# Patient Record
Sex: Male | Born: 1970 | Race: White | Hispanic: No | Smoking: Former smoker
Health system: Southern US, Community
[De-identification: ages and names within clinical notes are randomized; demographics above are authoritative.]

## PROBLEM LIST (undated history)

## (undated) DIAGNOSIS — Z9109 Other allergy status, other than to drugs and biological substances: Secondary | ICD-10-CM

## (undated) DIAGNOSIS — R519 Headache, unspecified: Secondary | ICD-10-CM

## (undated) DIAGNOSIS — G4730 Sleep apnea, unspecified: Secondary | ICD-10-CM

## (undated) DIAGNOSIS — E119 Type 2 diabetes mellitus without complications: Secondary | ICD-10-CM

## (undated) DIAGNOSIS — J189 Pneumonia, unspecified organism: Secondary | ICD-10-CM

## (undated) DIAGNOSIS — K209 Esophagitis, unspecified without bleeding: Secondary | ICD-10-CM

## (undated) DIAGNOSIS — J449 Chronic obstructive pulmonary disease, unspecified: Secondary | ICD-10-CM

## (undated) DIAGNOSIS — T7840XA Allergy, unspecified, initial encounter: Secondary | ICD-10-CM

## (undated) DIAGNOSIS — L509 Urticaria, unspecified: Secondary | ICD-10-CM

## (undated) DIAGNOSIS — F429 Obsessive-compulsive disorder, unspecified: Secondary | ICD-10-CM

## (undated) DIAGNOSIS — K219 Gastro-esophageal reflux disease without esophagitis: Secondary | ICD-10-CM

## (undated) DIAGNOSIS — J45909 Unspecified asthma, uncomplicated: Secondary | ICD-10-CM

## (undated) DIAGNOSIS — L409 Psoriasis, unspecified: Secondary | ICD-10-CM

## (undated) DIAGNOSIS — Z8669 Personal history of other diseases of the nervous system and sense organs: Secondary | ICD-10-CM

## (undated) DIAGNOSIS — F419 Anxiety disorder, unspecified: Secondary | ICD-10-CM

## (undated) DIAGNOSIS — Z8489 Family history of other specified conditions: Secondary | ICD-10-CM

## (undated) DIAGNOSIS — IMO0002 Reserved for concepts with insufficient information to code with codable children: Secondary | ICD-10-CM

## (undated) DIAGNOSIS — J439 Emphysema, unspecified: Secondary | ICD-10-CM

## (undated) DIAGNOSIS — F32A Depression, unspecified: Secondary | ICD-10-CM

## (undated) DIAGNOSIS — R0902 Hypoxemia: Secondary | ICD-10-CM

## (undated) DIAGNOSIS — R0600 Dyspnea, unspecified: Secondary | ICD-10-CM

## (undated) HISTORY — DX: Hypoxemia: R09.02

## (undated) HISTORY — DX: Depression, unspecified: F32.A

## (undated) HISTORY — DX: Type 2 diabetes mellitus without complications: E11.9

## (undated) HISTORY — PX: KNEE SURGERY: SHX244

## (undated) HISTORY — DX: Urticaria, unspecified: L50.9

## (undated) HISTORY — DX: Emphysema, unspecified: J43.9

## (undated) HISTORY — DX: Allergy, unspecified, initial encounter: T78.40XA

## (undated) HISTORY — DX: Reserved for concepts with insufficient information to code with codable children: IMO0002

## (undated) HISTORY — DX: Psoriasis, unspecified: L40.9

## (undated) NOTE — *Deleted (*Deleted)
Richard Fuentes has only attended 3 exercise sessions in pulmonary rehab since he started 10/16/2019.  He has not called in 2 weeks to inform us that he will not be present for the exercise sessions.  He is being discharged from the program due to extremely poor attendance.

---
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
## 2000-01-05 HISTORY — PX: KNEE SURGERY: SHX244

## 2010-04-28 ENCOUNTER — Inpatient Hospital Stay (INDEPENDENT_AMBULATORY_CARE_PROVIDER_SITE_OTHER)
Admission: RE | Admit: 2010-04-28 | Discharge: 2010-04-28 | Disposition: A | Discharge status: Home / Self Care | Payer: Self-pay | Source: Ambulatory Visit | Attending: Emergency Medicine | Admitting: Emergency Medicine

## 2010-04-28 DIAGNOSIS — R109 Unspecified abdominal pain: Secondary | ICD-10-CM

## 2010-04-28 DIAGNOSIS — R198 Other specified symptoms and signs involving the digestive system and abdomen: Secondary | ICD-10-CM

## 2010-04-28 LAB — POCT I-STAT, CHEM 8
BUN: 14 mg/dL (ref 6–23)
Chloride: 103 mEq/L (ref 96–112)
HCT: 51 % (ref 39.0–52.0)
Sodium: 138 mEq/L (ref 135–145)
TCO2: 27 mmol/L (ref 0–100)

## 2010-04-28 LAB — AST: AST: 26 U/L (ref 0–37)

## 2010-04-28 LAB — TSH: TSH: 0.821 u[IU]/mL (ref 0.350–4.500)

## 2010-04-28 LAB — ALT: ALT: 17 U/L (ref 0–53)

## 2010-12-23 ENCOUNTER — Ambulatory Visit: Payer: Self-pay

## 2010-12-23 DIAGNOSIS — D709 Neutropenia, unspecified: Secondary | ICD-10-CM

## 2010-12-23 DIAGNOSIS — R3911 Hesitancy of micturition: Secondary | ICD-10-CM

## 2011-12-06 ENCOUNTER — Ambulatory Visit: Payer: Self-pay

## 2011-12-06 ENCOUNTER — Ambulatory Visit: Payer: Self-pay | Admitting: Internal Medicine

## 2011-12-06 VITALS — BP 134/82 | HR 139 | Temp 98.3°F | Resp 24

## 2011-12-06 DIAGNOSIS — F411 Generalized anxiety disorder: Secondary | ICD-10-CM

## 2011-12-06 DIAGNOSIS — R0600 Dyspnea, unspecified: Secondary | ICD-10-CM

## 2011-12-06 DIAGNOSIS — R0609 Other forms of dyspnea: Secondary | ICD-10-CM

## 2011-12-06 DIAGNOSIS — J45909 Unspecified asthma, uncomplicated: Secondary | ICD-10-CM

## 2011-12-06 DIAGNOSIS — R0989 Other specified symptoms and signs involving the circulatory and respiratory systems: Secondary | ICD-10-CM

## 2011-12-06 DIAGNOSIS — R05 Cough: Secondary | ICD-10-CM

## 2011-12-06 DIAGNOSIS — F17200 Nicotine dependence, unspecified, uncomplicated: Secondary | ICD-10-CM

## 2011-12-06 DIAGNOSIS — R059 Cough, unspecified: Secondary | ICD-10-CM

## 2011-12-06 LAB — POCT CBC
Granulocyte percent: 67.9 %G (ref 37–80)
Hemoglobin: 16.1 g/dL (ref 14.1–18.1)
MCH, POC: 30.4 pg (ref 27–31.2)
MID (cbc): 1 — AB (ref 0–0.9)
MPV: 9.1 fL (ref 0–99.8)
POC MID %: 6.4 %M (ref 0–12)
Platelet Count, POC: 355 10*3/uL (ref 142–424)
RBC: 5.3 M/uL (ref 4.69–6.13)
WBC: 15 10*3/uL — AB (ref 4.6–10.2)

## 2011-12-06 MED ORDER — HYDROCODONE-HOMATROPINE 5-1.5 MG/5ML PO SYRP: 5.0000 mL | ORAL_SOLUTION | Freq: Four times a day (QID) | ORAL | Status: AC | PRN

## 2011-12-06 MED ORDER — ALBUTEROL SULFATE (2.5 MG/3ML) 0.083% IN NEBU
2.5000 mg | INHALATION_SOLUTION | Freq: Once | RESPIRATORY_TRACT | Status: AC
  Administered 2011-12-06: 2.5 mg via RESPIRATORY_TRACT

## 2011-12-06 MED ORDER — PREDNISONE 20 MG PO TABS: ORAL_TABLET | ORAL | Status: DC

## 2011-12-06 MED ORDER — AZITHROMYCIN 500 MG PO TABS: 500.0000 mg | ORAL_TABLET | Freq: Every day | ORAL | Status: DC

## 2011-12-06 NOTE — Progress Notes (Signed)
  Subjective:    Patient ID: Richard Fuentes, male    DOB: [birth year], 41 y.o.   MRN: [id number]  HPIcalled by staff to see urgently due to SOB Patient was in car 10 min ago and suddenlt felt dry throat and inability to get air in so came here immediately/ no chest pain,diaphoresis/nausea Hx cough for 1-2 months, incr worse over last 4 days tho no fever,chills Hx of problems breathing for few years but never evaluated/uses fathers ventolin prn Smokes and has many years Hx of SOB episodes that are part of Panic attacks which he now has frequently//had panic today as he noted the SOB/hx of past rx w/ paxil for GAD-Panic-OCD in [place] but could no longer afford so d/c ed 2-3 yrs ago--now uses breathing and imagery In [place] caring for father    Review of Systems Denies other sxtoms    Objective:   Physical Exam Apprehensive but PO 100% Able to decrease resp rate and sxtoms just by talking/examining HEENT clear No nodes or thyromeg Heart reg at 88 with no M Lungs w/ wheezing w/ forced expir at the R base Attempted neb w/ albut but he became panicky w/ breather      Results for orders placed in visit on 12/06/11  POCT CBC      Component Value Range   WBC 15.0 (*) 4.6 - 10.2 K/uL   Lymph, poc 3.9 (*) 0.6 - 3.4   POC LYMPH PERCENT 25.7  10 - 50 %L   MID (cbc) 1.0 (*) 0 - 0.9   POC MID % 6.4  0 - 12 %M   POC Granulocyte 10.2 (*) 2 - 6.9   Granulocyte percent 67.9  37 - 80 %G   RBC 5.30  4.69 - 6.13 M/uL   Hemoglobin 16.1  14.1 - 18.1 g/dL   HCT, POC 28.4  13.2 - 53.7 %   MCV 97.7 (*) 80 - 97 fL   MCH, POC 30.4  27 - 31.2 pg   MCHC 31.1 (*) 31.8 - 35.4 g/dL   RDW, POC 44.0     Platelet Count, POC 355  142 - 424 K/uL   MPV 9.1  0 - 99.8 fL   [facility] reading (PRIMARY) by  Dr. Merla Riches-  Increased markings right lower lobe/lots of chronic vs acute changes bilat  Calm w/ no breathing difficulties at conclusion of exam Assessment & Plan:  Acute SOB due to LRI plus  underlying RAD GAD w/ Panic  Meds ordered this encounter  Medications  . azithromycin (ZITHROMAX) 500 MG tablet    Sig: Take 1 tablet (500 mg total) by mouth daily.    Dispense:  5 tablet    Refill:  0  . predniSONE (DELTASONE) 20 MG tablet    Sig: 3/3/2/2/1/1 single daily dose for 6 days    Dispense:  12 tablet    Refill:  0  . HYDROcodone-homatropine (HYCODAN) 5-1.5 MG/5ML syrup    Sig: Take 5 mLs by mouth every 6 (six) hours as needed for cough.    Dispense:  120 mL    Refill:  0  He has Vent/also uses benadryl Offered f/u for CPE and rx of all problems tho he refuses for financial reasons

## 2011-12-07 ENCOUNTER — Encounter: Payer: Self-pay | Admitting: Internal Medicine

## 2011-12-07 DIAGNOSIS — J45909 Unspecified asthma, uncomplicated: Secondary | ICD-10-CM | POA: Insufficient documentation

## 2011-12-07 DIAGNOSIS — F17200 Nicotine dependence, unspecified, uncomplicated: Secondary | ICD-10-CM | POA: Insufficient documentation

## 2011-12-07 DIAGNOSIS — F411 Generalized anxiety disorder: Secondary | ICD-10-CM | POA: Insufficient documentation

## 2012-01-03 ENCOUNTER — Emergency Department (INDEPENDENT_AMBULATORY_CARE_PROVIDER_SITE_OTHER): Payer: Self-pay

## 2012-01-03 ENCOUNTER — Emergency Department (INDEPENDENT_AMBULATORY_CARE_PROVIDER_SITE_OTHER)
Admission: EM | Admit: 2012-01-03 | Discharge: 2012-01-03 | Disposition: A | Discharge status: Home / Self Care | Payer: Self-pay | Source: Home / Self Care | Attending: Emergency Medicine | Admitting: Emergency Medicine

## 2012-01-03 ENCOUNTER — Encounter (HOSPITAL_COMMUNITY): Payer: Self-pay | Admitting: *Deleted

## 2012-01-03 DIAGNOSIS — J439 Emphysema, unspecified: Secondary | ICD-10-CM

## 2012-01-03 DIAGNOSIS — R0609 Other forms of dyspnea: Secondary | ICD-10-CM

## 2012-01-03 DIAGNOSIS — R0989 Other specified symptoms and signs involving the circulatory and respiratory systems: Secondary | ICD-10-CM

## 2012-01-03 DIAGNOSIS — J438 Other emphysema: Secondary | ICD-10-CM

## 2012-01-03 DIAGNOSIS — R0600 Dyspnea, unspecified: Secondary | ICD-10-CM

## 2012-01-03 MED ORDER — PREDNISONE 20 MG PO TABS: 40.0000 mg | ORAL_TABLET | Freq: Every day | ORAL | Status: DC

## 2012-01-03 MED ORDER — OMEPRAZOLE 20 MG PO CPDR: 40.0000 mg | DELAYED_RELEASE_CAPSULE | Freq: Every day | ORAL | Status: DC

## 2012-01-03 MED ORDER — IPRATROPIUM BROMIDE HFA 17 MCG/ACT IN AERS: 2.0000 | INHALATION_SPRAY | Freq: Four times a day (QID) | RESPIRATORY_TRACT | Status: DC

## 2012-01-03 NOTE — ED Notes (Signed)
Pt  Reports  Symptoms  Of  Cough  /  Congested    Difficulty  Breathing     Pt     Reports  Was  Seen       Earlier  This  Month  At         [facility]    For  resp  Infection   But  Did  Not  Get  Anti biotics  Filled   -  Pt is  A  Smoker

## 2012-01-03 NOTE — ED Provider Notes (Addendum)
History     CSN: [id number]  Arrival date & time 01/03/12  4098   First MD Initiated Contact with Patient 01/03/12 1813      Chief Complaint  Patient presents with  . Cough    (Consider location/radiation/quality/duration/timing/severity/associated sxs/prior treatment) HPI Comments: Patient presents this evening to urgent care describing that he's been coughing and congestion and having difficulty breathing for the last few weeks. He describes that he was seen at [facility] urgent care about 3 weeks ago was prescribed antibiotics that he couldn't fill out for financial reasons. He continues to cough and feels tired and short of breath.  Patient also describes that he came to urgent care about 2 years ago ( shows [place] discharged instructions- where he was provided with referrals to establish her primary care doctor's and diagnosed with GERD and prescribed a PPI at that time).  Patient describes it he continues to have symptoms after all these years of reflux.  Patient is a 41 y.o. male presenting with cough. The history is provided by the patient.  Cough This is a recurrent problem. The problem occurs constantly. The problem has not changed since onset.The cough is productive of sputum. There has been no fever. Associated symptoms include shortness of breath. Pertinent negatives include no chest pain, no chills, no sweats, no weight loss, no ear congestion, no ear pain, no headaches, no rhinorrhea, no myalgias and no wheezing. He has tried nothing for the symptoms. The treatment provided no relief. His past medical history does not include bronchitis, pneumonia, bronchiectasis, COPD, emphysema or asthma.    History reviewed. No pertinent past medical history.  History reviewed. No pertinent past surgical history.  History reviewed. No pertinent family history.  History  Substance Use Topics  . Smoking status: Current Every Day Smoker  . Smokeless tobacco: Not on file  . Alcohol Use: Not  on file      Review of Systems  Constitutional: Positive for fatigue. Negative for fever, chills, weight loss, diaphoresis, activity change and unexpected weight change.  HENT: Negative for ear pain and rhinorrhea.   Respiratory: Positive for cough, chest tightness and shortness of breath. Negative for apnea, choking, wheezing and stridor.   Cardiovascular: Negative for chest pain.  Gastrointestinal: Negative for nausea.  Genitourinary: Negative for dysuria.  Musculoskeletal: Negative for myalgias.  Neurological: Negative for headaches.    Allergies  Penicillins  Home Medications   Current Outpatient Rx  Name  Route  Sig  Dispense  Refill  . AZITHROMYCIN 500 MG PO TABS   Oral   Take 1 tablet (500 mg total) by mouth daily.   5 tablet   0   . DIPHENHYDRAMINE HCL (SLEEP) 25 MG PO TABS   Oral   Take 25 mg by mouth daily.         . IPRATROPIUM BROMIDE HFA 17 MCG/ACT IN AERS   Inhalation   Inhale 2 puffs into the lungs every 6 (six) hours.   1 Inhaler   1   . OMEPRAZOLE 20 MG PO CPDR   Oral   Take 2 capsules (40 mg total) by mouth daily.   30 capsule   2   . PREDNISONE 20 MG PO TABS      3/3/2/2/1/1 single daily dose for 6 days   12 tablet   0   . PREDNISONE 20 MG PO TABS   Oral   Take 2 tablets (40 mg total) by mouth daily. 2 tablets daily for 5 days  10 tablet   0     BP 116/70  Pulse 78  Temp 97.9 F (36.6 C) (Oral)  Resp 22  SpO2 100%  Physical Exam  Nursing note and vitals reviewed. Constitutional: Vital signs are normal.  Non-toxic appearance. He does not have a sickly appearance. He appears ill. No distress.  HENT:  Head: Normocephalic.  Eyes: Conjunctivae normal are normal.  Neck: Neck supple. No JVD present.  Cardiovascular: Normal rate and regular rhythm.  Exam reveals no gallop and no friction rub.   No murmur heard. Pulmonary/Chest: Effort normal. No respiratory distress. He has decreased breath sounds. He has no wheezes. He has no  rhonchi. He has no rales. He exhibits no tenderness.  Lymphadenopathy:    He has no cervical adenopathy.  Skin: Skin is warm. No rash noted. He is not diaphoretic. No erythema.    ED Course  Procedures (including critical care time)  Labs Reviewed - No data to display Dg Chest 2 View  01/03/2012  *RADIOLOGY REPORT*  Clinical Data: 41 year old male cough, congestion, shortness of breath.  CHEST - 2 VIEW  Comparison: 12/06/2011.  Findings: Stable hyperinflated lungs.  Cardiac size and mediastinal contours are within normal limits.  Visualized tracheal air column is within normal limits.  Attenuation of upper lobe vascular markings compatible with emphysema.  No pneumothorax, pulmonary edema, pleural effusion or confluent pulmonary opacity. No acute osseous abnormality identified.  IMPRESSION: Hyperinflation/emphysema. No acute cardiopulmonary abnormality.   Original Report Authenticated By: Erskine Speed, M.D.      1. Emphysema   2. Dyspnea   3. Esophagitis     EKG former urgent care showing normal sinus rhythm ventricular rate of 73 beats per minute. No ST or T changes to suggest acute or chronic ischemia no abnormal TR QRS duration intervals. Normal QT interval.  MDM   Problem #1  Dyspnea in a chronic cough. Patient is a chronic smoker,   smoking cessation discussed. Patient was prescribed Atrovent and a course of prednisone. Encouraged him to followup with primary care Dr. and suggested to request a pulmonary functional test. Patient seemed to be pre-contemplative about discontinue smoking.  Problem #2 recurrent reflux/GERD symptomatology. Patient was prescribed a PPI and given the length of his symptoms have recommended he see a gastroenterologist to rule out Barrett's esophagus or esophageal malignancy. Patient does not have any stigmata or constitutional symptoms to suggest a malignancy but the chronicity of her symptoms are somewhat worrisome.   Problem #3 have recommended patient  to establish continuity of care at [facility] urgent care as he has Been there before.     Jimmie Molly, MD 01/03/12 1610  Jimmie Molly, MD 01/03/12 [phone]

## 2012-01-06 ENCOUNTER — Ambulatory Visit (INDEPENDENT_AMBULATORY_CARE_PROVIDER_SITE_OTHER): Payer: Self-pay | Admitting: Internal Medicine

## 2012-01-06 VITALS — BP 117/75 | HR 76 | Temp 98.2°F | Resp 16 | Ht 73.0 in | Wt 179.6 lb

## 2012-01-06 DIAGNOSIS — R05 Cough: Secondary | ICD-10-CM

## 2012-01-06 DIAGNOSIS — R059 Cough, unspecified: Secondary | ICD-10-CM

## 2012-01-06 DIAGNOSIS — J209 Acute bronchitis, unspecified: Secondary | ICD-10-CM

## 2012-01-06 DIAGNOSIS — J440 Chronic obstructive pulmonary disease with acute lower respiratory infection: Secondary | ICD-10-CM

## 2012-01-06 NOTE — Progress Notes (Signed)
  Subjective:    Patient ID: Richard Fuentes, male    DOB: 1970-06-26, 42 y.o.   MRN: [id number]  HPI  Shortness of breath, cough, can't sleep secondary to shortness of breath. No sputum. No chest pain. Onset 4 months ago. first dr visit for this about 1 month ago here. He was dx with acute bronchitus and given steroids and antibitoic a z pack.Has an albuterol inhaler from his dad which is unused. pt had the medication filled but he did not take the medication because he read about it on the internet and was afraid he woudl have a reaction to it. On dec 30 pt went to [place] [facility] and had a second chest xray which was read by radiology oficailly as early copd. Pt was instructed to take the medication but again did not take the meds. tonight he was going to the waiting room at the er to sit there nad not check in to take the meds and be in the right place in case he had a reaction to the meds but he decided to come here for a second opinion. Continue to be sob at rest. no chest pain. no fever.pt is a smoker and has smoked 1 1/2 packs year for 40 years.   Review of Systems  Constitutional: Negative.   HENT: Negative.   Respiratory: Positive for shortness of breath. Negative for cough, choking, chest tightness and stridor.   Cardiovascular: Negative.   Gastrointestinal: Negative.   Genitourinary: Negative.   Musculoskeletal: Negative.   Skin: Negative.   Neurological: Negative.   Hematological: Negative.   Psychiatric/Behavioral: Negative.        Objective:   Physical Exam  Nursing note and vitals reviewed. Constitutional: He is oriented to person, place, and time. He appears well-developed and well-nourished.  HENT:  Head: Normocephalic and atraumatic.  Right Ear: External ear normal.  Left Ear: External ear normal.  Nose: Nose normal.  Mouth/Throat: Oropharynx is clear and moist.  Eyes: Conjunctivae normal and EOM are normal. Pupils are equal, round, and reactive to light.  Neck:  Normal range of motion. Neck supple.  Cardiovascular: Normal rate, regular rhythm, normal heart sounds and intact distal pulses.   Pulmonary/Chest: He has wheezes.  Abdominal: Soft. Bowel sounds are normal.  Musculoskeletal: Normal range of motion.  Neurological: He is alert and oriented to person, place, and time. He has normal reflexes.  Skin: Skin is warm and dry.  Psychiatric: He has a normal mood and affect. His behavior is normal. Judgment and thought content normal.          Assessment & Plan:  Copd/ bronchitis. Tried to give pt a nebulizer treatment in the office but pt refuses. o2 sat is 100 percent. No need to repeat the chest xray for a third time. Pt instructed to take the medications including the bronchodilator the steroid and the antibiotic. Pt instructed to return for pft adn long term treatment. Pt instructed to stop all smoking and to remove himself from all smoke environments.

## 2012-01-06 NOTE — Patient Instructions (Signed)
Stop smoking. Take the antibiotic , the steroid and use the inhaler as directed. Return for follow up visit in 3 days. If your symptom worsen return to the er.

## 2012-08-13 ENCOUNTER — Encounter (HOSPITAL_COMMUNITY): Payer: Self-pay | Admitting: *Deleted

## 2012-08-13 ENCOUNTER — Emergency Department (INDEPENDENT_AMBULATORY_CARE_PROVIDER_SITE_OTHER)
Admission: EM | Admit: 2012-08-13 | Discharge: 2012-08-13 | Disposition: A | Discharge status: Home / Self Care | Payer: Self-pay | Source: Home / Self Care | Attending: Family Medicine | Admitting: Family Medicine

## 2012-08-13 DIAGNOSIS — J40 Bronchitis, not specified as acute or chronic: Secondary | ICD-10-CM

## 2012-08-13 DIAGNOSIS — F410 Panic disorder [episodic paroxysmal anxiety] without agoraphobia: Secondary | ICD-10-CM

## 2012-08-13 HISTORY — DX: Other allergy status, other than to drugs and biological substances: Z91.09

## 2012-08-13 HISTORY — DX: Chronic obstructive pulmonary disease, unspecified: J44.9

## 2012-08-13 HISTORY — DX: Obsessive-compulsive disorder, unspecified: F42.9

## 2012-08-13 HISTORY — DX: Esophagitis, unspecified without bleeding: K20.90

## 2012-08-13 HISTORY — DX: Personal history of other diseases of the nervous system and sense organs: Z86.69

## 2012-08-13 HISTORY — DX: Anxiety disorder, unspecified: F41.9

## 2012-08-13 HISTORY — DX: Esophagitis, unspecified: K20.9

## 2012-08-13 HISTORY — DX: Unspecified asthma, uncomplicated: J45.909

## 2012-08-13 MED ORDER — METHYLPREDNISOLONE SODIUM SUCC 125 MG IJ SOLR
INTRAMUSCULAR | Status: AC
  Filled 2012-08-13: qty 2

## 2012-08-13 MED ORDER — ALBUTEROL SULFATE (5 MG/ML) 0.5% IN NEBU
INHALATION_SOLUTION | RESPIRATORY_TRACT | Status: AC
  Filled 2012-08-13: qty 0.5

## 2012-08-13 MED ORDER — IPRATROPIUM BROMIDE 0.02 % IN SOLN
0.5000 mg | Freq: Once | RESPIRATORY_TRACT | Status: AC
  Administered 2012-08-13: 0.5 mg via RESPIRATORY_TRACT

## 2012-08-13 MED ORDER — ALBUTEROL SULFATE (5 MG/ML) 0.5% IN NEBU
2.5000 mg | INHALATION_SOLUTION | Freq: Once | RESPIRATORY_TRACT | Status: AC
  Administered 2012-08-13: 2.5 mg via RESPIRATORY_TRACT

## 2012-08-13 MED ORDER — ALBUTEROL SULFATE HFA 108 (90 BASE) MCG/ACT IN AERS: 1.0000 | INHALATION_SPRAY | Freq: Four times a day (QID) | RESPIRATORY_TRACT | Status: DC | PRN

## 2012-08-13 MED ORDER — AZITHROMYCIN 250 MG PO TABS: 250.0000 mg | ORAL_TABLET | Freq: Every day | ORAL | Status: DC

## 2012-08-13 MED ORDER — METHYLPREDNISOLONE SODIUM SUCC 125 MG IJ SOLR: 80.0000 mg | Freq: Once | INTRAMUSCULAR | Status: DC

## 2012-08-13 MED ORDER — EPINEPHRINE 0.3 MG/0.3ML IJ SOAJ: 0.3000 mg | INTRAMUSCULAR | Status: DC | PRN

## 2012-08-13 MED ORDER — CLONAZEPAM 0.5 MG PO TABS: 0.5000 mg | ORAL_TABLET | Freq: Two times a day (BID) | ORAL | Status: DC | PRN

## 2012-08-13 MED ORDER — PREDNISONE 20 MG PO TABS: ORAL_TABLET | ORAL | Status: DC

## 2012-08-13 MED ORDER — DIPHENHYDRAMINE HCL 25 MG PO CAPS
50.0000 mg | ORAL_CAPSULE | Freq: Once | ORAL | Status: AC
  Administered 2012-08-13: 50 mg via ORAL

## 2012-08-13 MED ORDER — DIPHENHYDRAMINE HCL 25 MG PO CAPS
ORAL_CAPSULE | ORAL | Status: AC
  Filled 2012-08-13: qty 2

## 2012-08-13 NOTE — ED Notes (Signed)
Pt requested to complete breathing treatment as "Benadryl seems to be working".

## 2012-08-13 NOTE — ED Provider Notes (Signed)
CSN: [id number]     Arrival date & time 08/13/12  1240 History     First MD Initiated Contact with Patient 08/13/12 1315     Chief Complaint  Patient presents with  . Shortness of Breath  . Wheezing  . Chest Pain   (Consider location/radiation/quality/duration/timing/severity/associated sxs/prior Treatment) HPI Comments: 42 year old male smoker with history of COPD and anxiety disorder among other comorbidities. Here complaining of intermittent wheezing and productive cough of brownish yellow sputum for one week. Symptoms worse on last night and state he has not been able to sleep well. Denies fever or chills. Reports chest tightness and anxiety but denies chest pain or pleuritic chest type of discomfort. appetite is at base line no abdominal pain. States he distrusts medications and that "has not taken any medication since 1994" although later reported he took a benadryl 25 mg tab about 6 hours ago.  States he has never filled the prescriptions he has gotten in the past.    Past Medical History  Diagnosis Date  . COPD (chronic obstructive pulmonary disease)   . Esophagitis   . Asthma   . Environmental allergies   . Hx of migraines   . OCD (obsessive compulsive disorder)   . Anxiety    Past Surgical History  Procedure Laterality Date  . Knee surgery     No family history on file. History  Substance Use Topics  . Smoking status: Current Every Day Smoker -- 1.00 packs/day    Types: Cigarettes  . Smokeless tobacco: Not on file  . Alcohol Use: No    Review of Systems  Constitutional: Negative for fever, chills, diaphoresis, activity change, appetite change and fatigue.  HENT: Negative for congestion, sore throat, rhinorrhea, sneezing and sinus pressure.   Eyes: Negative for discharge.  Respiratory: Positive for cough, shortness of breath and wheezing.   Cardiovascular: Positive for chest pain.  Gastrointestinal: Negative for nausea, vomiting, abdominal pain, diarrhea and  blood in stool.  Endocrine: Negative for cold intolerance, heat intolerance, polydipsia, polyphagia and polyuria.  Skin: Negative for rash.  Neurological: Negative for dizziness, tremors, seizures, syncope, weakness and headaches.  Psychiatric/Behavioral: Positive for behavioral problems. Negative for suicidal ideas, hallucinations, confusion, self-injury and agitation. The patient is nervous/anxious.   All other systems reviewed and are negative.    Allergies  Penicillins  Home Medications   Current Outpatient Rx  Name  Route  Sig  Dispense  Refill  . albuterol (PROVENTIL HFA;VENTOLIN HFA) 108 (90 BASE) MCG/ACT inhaler   Inhalation   Inhale 1-2 puffs into the lungs every 6 (six) hours as needed for wheezing.   1 Inhaler   0   . azithromycin (ZITHROMAX) 250 MG tablet   Oral   Take 1 tablet (250 mg total) by mouth daily. Take first 2 tablets together, then 1 every day until finished.   6 tablet   0   . clonazePAM (KLONOPIN) 0.5 MG tablet   Oral   Take 1 tablet (0.5 mg total) by mouth 2 (two) times daily as needed for anxiety.   10 tablet   0   . diphenhydrAMINE (SOMINEX) 25 MG tablet   Oral   Take 25 mg by mouth daily.         Marland Kitchen EPINEPHrine (EPIPEN) 0.3 mg/0.3 mL SOAJ injection   Intramuscular   Inject 0.3 mLs (0.3 mg total) into the muscle as needed.   1 Device   2   . ipratropium (ATROVENT HFA) 17 MCG/ACT inhaler   Inhalation  Inhale 2 puffs into the lungs every 6 (six) hours.   1 Inhaler   1   . EXPIRED: omeprazole (PRILOSEC) 20 MG capsule   Oral   Take 2 capsules (40 mg total) by mouth daily.   30 capsule   2   . predniSONE (DELTASONE) 20 MG tablet      2 tabs po daily for 5 days   10 tablet   0   . ranitidine (ZANTAC) 75 MG tablet   Oral   Take 75 mg by mouth 2 (two) times daily.          BP 160/85  Pulse 87  Temp(Src) 98.3 F (36.8 C) (Oral)  Resp 20  SpO2 99% Physical Exam  Nursing note and vitals reviewed. Constitutional: He is  oriented to person, place, and time. He appears well-developed and well-nourished. He appears distressed.  HENT:  Head: Normocephalic and atraumatic.  Right Ear: External ear normal.  Left Ear: External ear normal.  Nose: Nose normal.  Mouth/Throat: Oropharynx is clear and moist. No oropharyngeal exudate.  Eyes: Conjunctivae and EOM are normal. Pupils are equal, round, and reactive to light. Right eye exhibits no discharge. Left eye exhibits no discharge. No scleral icterus.  Neck: Neck supple. No JVD present. No thyromegaly present.  Cardiovascular: Normal rate, regular rhythm, normal heart sounds and intact distal pulses.  Exam reveals no gallop and no friction rub.   No murmur heard. Pulmonary/Chest: Effort normal. No respiratory distress. He has no rales. He exhibits no tenderness.  Bilateral mild inspiratory and sporadic expiratory wheezing, no crackles. No tachypnea no orthopnea, talking in full sentences's.   Abdominal: Soft. He exhibits no mass. There is no tenderness.  Lymphadenopathy:    He has no cervical adenopathy.  Neurological: He is alert and oriented to person, place, and time.  Skin: No rash noted. He is not diaphoretic.  Psychiatric: Judgment and thought content normal.  Patient anxious, appears possible OCD. Compulsion against medications and treatment refused treatment saying "Im sorry my plan was to receive treatment but I just can" I just need a new brain..    ED Course   Procedures (including critical care time)  Labs Reviewed - No data to display No results found. 1. Anxiety attack   2. Bronchitis     MDM  Impress no acute respiratory distress likely chronic COPD symptoms with mild exacerbation but my impression is that most symptoms likely triggered by anxiety disorder. Patient initially agreed to receive treatment (nebulization and IM solumedrol after a benadryl tablet to help with anxiety but later refused. Appears capable to make decisions.  Prescribed  klonopin 0.5mg  x10 tab. Albuterol inhaler, azithromycin and prednisone.  Behavioral clinic referral. Asked to go to the ED if worsening or new symptoms.   Sharin Grave, MD 08/14/12 [phone]

## 2012-08-13 NOTE — ED Notes (Signed)
Pt declines injection at this time.  States he would feel more comfortable waiting until Benedryl has some time to work before receiving breathing treatment.  Comfort measures offered.

## 2012-08-13 NOTE — ED Notes (Signed)
Explained to pt need for securing a ride home prior to receiving any meds due to Benadryl causing drowsiness.  Pt states he is attempting to secure.

## 2012-08-13 NOTE — ED Notes (Signed)
Patient hesitantly taking breathing treatment.  Still states he wants to wait on the injection.  Family member en route.  Exam door open for pt's anxiety/comfort.

## 2012-08-13 NOTE — ED Notes (Signed)
Pt calling to obtain ride home (drove himself).

## 2012-08-13 NOTE — ED Notes (Signed)
Pt stopped breathing treatment approx 5 minutes after initiating.  States he cannot do it, it's making him feel very anxious.  Inquired "how can I stop this?"  Discussed need for behavioral health therapy.  States he used to go, but "then it changed", and he is unsure if he could go back again due to anxiety.  States was never able to try medication for OCD and anxiety.

## 2012-08-13 NOTE — ED Notes (Signed)
Reports productive cough, wheezing sensation, chest tightness, dyspnea x 1 wk without fevers.  Does not have any inhalers at home.  Chest tightness is generalized and worse with coughing.  Has been sleeping in recliner due to increased dyspnea.  States all sxs have gotten worse since last night.

## 2012-08-13 NOTE — ED Notes (Signed)
Reviewed available Rxs with pt & family member.  Instructed to go to [facility] ED (due to behavioral health component) for any worsening sxs, SOB, getting sicker.  Instructed not to drive for at least 8 hrs due to Benadryl.

## 2012-08-13 NOTE — ED Notes (Signed)
Pt apologetic, anxious, and hesitant to take any meds.  States he never fills Rxs or takes medicines due to anxiety.  "I never really leave the house either.  It was really hard for me to come here."  States he does have a ride home.

## 2012-08-13 NOTE — ED Notes (Signed)
Breathing treatment complete.  Family member arrival.

## 2015-05-25 ENCOUNTER — Emergency Department (HOSPITAL_COMMUNITY): Payer: Self-pay

## 2015-05-25 ENCOUNTER — Encounter (HOSPITAL_COMMUNITY): Payer: Self-pay

## 2015-05-25 ENCOUNTER — Emergency Department (HOSPITAL_COMMUNITY)
Admission: EM | Admit: 2015-05-25 | Discharge: 2015-05-26 | Disposition: A | Discharge status: Home / Self Care | Payer: Self-pay | Attending: Emergency Medicine | Admitting: Emergency Medicine

## 2015-05-25 DIAGNOSIS — R062 Wheezing: Secondary | ICD-10-CM

## 2015-05-25 DIAGNOSIS — R0789 Other chest pain: Secondary | ICD-10-CM

## 2015-05-25 DIAGNOSIS — Z79899 Other long term (current) drug therapy: Secondary | ICD-10-CM | POA: Insufficient documentation

## 2015-05-25 DIAGNOSIS — J441 Chronic obstructive pulmonary disease with (acute) exacerbation: Secondary | ICD-10-CM | POA: Insufficient documentation

## 2015-05-25 DIAGNOSIS — R059 Cough, unspecified: Secondary | ICD-10-CM

## 2015-05-25 DIAGNOSIS — Z720 Tobacco use: Secondary | ICD-10-CM

## 2015-05-25 DIAGNOSIS — R0602 Shortness of breath: Secondary | ICD-10-CM

## 2015-05-25 DIAGNOSIS — F17210 Nicotine dependence, cigarettes, uncomplicated: Secondary | ICD-10-CM | POA: Insufficient documentation

## 2015-05-25 DIAGNOSIS — R05 Cough: Secondary | ICD-10-CM

## 2015-05-25 MED ORDER — ALBUTEROL SULFATE HFA 108 (90 BASE) MCG/ACT IN AERS: 2.0000 | INHALATION_SPRAY | RESPIRATORY_TRACT | Status: DC | PRN

## 2015-05-25 MED ORDER — ALBUTEROL SULFATE (2.5 MG/3ML) 0.083% IN NEBU
5.0000 mg | INHALATION_SOLUTION | Freq: Once | RESPIRATORY_TRACT | Status: AC
  Administered 2015-05-25: 5 mg via RESPIRATORY_TRACT
  Filled 2015-05-25: qty 6

## 2015-05-25 MED ORDER — ALBUTEROL SULFATE (2.5 MG/3ML) 0.083% IN NEBU
5.0000 mg | INHALATION_SOLUTION | Freq: Once | RESPIRATORY_TRACT | Status: AC
Start: 2015-05-25 — End: 2015-05-25
  Administered 2015-05-25: 5 mg via RESPIRATORY_TRACT
  Filled 2015-05-25: qty 6

## 2015-05-25 MED ORDER — AZITHROMYCIN 250 MG PO TABS: ORAL_TABLET | ORAL | Status: DC

## 2015-05-25 MED ORDER — PREDNISONE 20 MG PO TABS
60.0000 mg | ORAL_TABLET | Freq: Once | ORAL | Status: AC
  Administered 2015-05-26: 60 mg via ORAL
  Filled 2015-05-25: qty 3

## 2015-05-25 MED ORDER — IPRATROPIUM BROMIDE 0.02 % IN SOLN
0.5000 mg | Freq: Once | RESPIRATORY_TRACT | Status: AC
Start: 2015-05-25 — End: 2015-05-25
  Administered 2015-05-25: 0.5 mg via RESPIRATORY_TRACT
  Filled 2015-05-25: qty 2.5

## 2015-05-25 MED ORDER — PREDNISONE 20 MG PO TABS: ORAL_TABLET | ORAL | Status: DC

## 2015-05-25 NOTE — ED Notes (Signed)
Pt with cough x 3 days.  Asthma.  Inhaler not working. Cough is productive.  No fever.

## 2015-05-25 NOTE — ED Notes (Signed)
Called for patient to be triaged with no answer.

## 2015-05-25 NOTE — ED Provider Notes (Signed)
CSN: [id number]     Arrival date & time 05/25/15  1716 History   First MD Initiated Contact with Patient 05/25/15 2300     Chief Complaint  Patient presents with  . Asthma     (Consider location/radiation/quality/duration/timing/severity/associated sxs/prior Treatment) HPI Comments: Richard Fuentes is a 45 y.o. male with a PMHx of COPD, esophagitis, asthma, environmental allergies, migraines, OCD, and anxiety, who presents to the ED with complaints of cough, wheezing, shortness of breath, and chest tightness 3-4 days. He reports his cough is accompanied by a white sputum production. He states that his home albuterol inhaler has not helped, has no known aggravating factors, and reports relief with the albuterol nebulizer treatment was given in triage. Feels like this is somewhat similar to his asthma exacerbations, although he hasn't had issues with asthma/COPD in 3-4 years. He admits that he continues to be a tobacco smoker.  He denies any fevers, chills, rhinorrhea, sore throat, eye itching or redness, chest pain, hemoptysis, leg swelling, recent travel/surgery/immobilization, personal or family history of DVT/PE, abdominal pain, nausea vomiting, diarrhea, constipation, dysuria, hematuria, numbness, tingling, or focal weakness. Denies any sick contacts.  Patient is a 45 y.o. male presenting with asthma. The history is provided by the patient. No language interpreter was used.  Asthma This is a recurrent problem. The current episode started in the past 7 days. The problem occurs constantly. The problem has been unchanged. Associated symptoms include coughing. Pertinent negatives include no abdominal pain, arthralgias, chest pain, chills, fever, myalgias, nausea, numbness, sore throat, urinary symptoms, vomiting or weakness. Nothing aggravates the symptoms. Treatments tried: home albuterol inhaler and albuterol neb tx here. The treatment provided mild relief.    Past Medical History  Diagnosis Date   . COPD (chronic obstructive pulmonary disease) (HCC)   . Esophagitis   . Asthma   . Environmental allergies   . Hx of migraines   . OCD (obsessive compulsive disorder)   . Anxiety    Past Surgical History  Procedure Laterality Date  . Knee surgery     History reviewed. No pertinent family history. Social History  Substance Use Topics  . Smoking status: Current Every Day Smoker -- 1.00 packs/day    Types: Cigarettes  . Smokeless tobacco: None  . Alcohol Use: No    Review of Systems  Constitutional: Negative for fever and chills.  HENT: Negative for ear pain, rhinorrhea, sinus pressure and sore throat.   Eyes: Negative for redness and itching.  Respiratory: Positive for cough, chest tightness, shortness of breath and wheezing.   Cardiovascular: Negative for chest pain and leg swelling.  Gastrointestinal: Negative for nausea, vomiting, abdominal pain, diarrhea and constipation.  Genitourinary: Negative for dysuria and hematuria.  Musculoskeletal: Negative for myalgias and arthralgias.  Skin: Negative for color change.  Allergic/Immunologic: Positive for environmental allergies. Negative for immunocompromised state.  Neurological: Negative for weakness and numbness.  Psychiatric/Behavioral: Negative for confusion.   10 Systems reviewed and are negative for acute change except as noted in the HPI.    Allergies  Penicillins  Home Medications   Prior to Admission medications   Medication Sig Start Date End Date Taking? Authorizing Provider  albuterol (PROVENTIL HFA;VENTOLIN HFA) 108 (90 BASE) MCG/ACT inhaler Inhale 1-2 puffs into the lungs every 6 (six) hours as needed for wheezing. 08/13/12  Yes Adlih Moreno-Coll, MD  clonazePAM (KLONOPIN) 0.5 MG tablet Take 1 tablet (0.5 mg total) by mouth 2 (two) times daily as needed for anxiety. Patient not taking: Reported on 05/25/2015  08/13/12   Adlih Moreno-Coll, MD  EPINEPHrine (EPIPEN) 0.3 mg/0.3 mL SOAJ injection Inject 0.3 mLs  (0.3 mg total) into the muscle as needed. Patient not taking: Reported on 05/25/2015 08/13/12   Jimmie Molly, MD  ipratropium (ATROVENT HFA) 17 MCG/ACT inhaler Inhale 2 puffs into the lungs every 6 (six) hours. Patient not taking: Reported on 05/25/2015 01/03/12   Jimmie Molly, MD   BP 124/88 mmHg  Pulse 87  Temp(Src) 98.4 F (36.9 C) (Oral)  Resp 20  SpO2 100% Physical Exam  Constitutional: He is oriented to person, place, and time. Vital signs are normal. He appears well-developed and well-nourished.  Non-toxic appearance. No distress.  Afebrile, nontoxic, NAD  HENT:  Head: Normocephalic and atraumatic.  Mouth/Throat: Oropharynx is clear and moist and mucous membranes are normal.  Eyes: Conjunctivae and EOM are normal. Right eye exhibits no discharge. Left eye exhibits no discharge.  Neck: Normal range of motion. Neck supple.  Cardiovascular: Normal rate, regular rhythm, normal heart sounds and intact distal pulses.  Exam reveals no gallop and no friction rub.   No murmur heard. RRR, nl s1/s2, no m/r/g, distal pulses intact, no pedal edema   Pulmonary/Chest: Effort normal. No respiratory distress. He has decreased breath sounds. He has wheezes. He has no rhonchi. He has no rales.  Diminished lung sounds throughout all fields with faint expiratory wheezing noted throughout, no rhonchi or rales, no hypoxia or increased WOB, speaking in full sentences, SpO2 100% on RA   Abdominal: Soft. Normal appearance and bowel sounds are normal. He exhibits no distension. There is no tenderness. There is no rigidity, no rebound, no guarding, no CVA tenderness, no tenderness at McBurney's point and negative Murphy's sign.  Musculoskeletal: Normal range of motion.  Neurological: He is alert and oriented to person, place, and time. He has normal strength. No sensory deficit.  Skin: Skin is warm, dry and intact. No rash noted.  Psychiatric: He has a normal mood and affect.  Nursing note and vitals  reviewed.   ED Course  Procedures (including critical care time) Labs Review Labs Reviewed - No data to display  Imaging Review Dg Chest 2 View  05/25/2015  CLINICAL DATA:  45 year old male with 3 day history of cough. Clinical history of asthma. EXAM: CHEST  2 VIEW COMPARISON:  Prior chest x-ray 01/03/2012 FINDINGS: Stable cardiac and mediastinal contours. Similar chronic bronchitic change and minimal interstitial prominence. No focal airspace consolidation, pulmonary edema, pleural effusion or pneumothorax. The lungs are hyperinflated. IMPRESSION: Stable chest x-ray without evidence of active cardiopulmonary disease. Electronically Signed   By: Malachy Moan M.D.   On: 05/25/2015 18:33   I have personally reviewed and evaluated these images and lab results as part of my medical decision-making.   EKG Interpretation None      MDM   Final diagnoses:  COPD exacerbation (HCC)  Wheezing  Chest tightness  SOB (shortness of breath)  Cough  Tobacco use    45 y.o. male here with cough x3 days with white sputum, wheezing, SOB, and chest tightness. Inhaler at home not helping, but feels like the albuterol neb here helped. Lung sounds diminished throughout with faint expiratory wheezing noted, no rhonchi/rales. No LE swelling, no tachycardia or hypoxia, PERC neg, doubt PE. No chest pain complaint. Doubt need for labs, CXR without acute process. Will give second neb, prednisone, and reassess. Likely home with inhaler/pred/zpack for COPD exacerbation  12:35 AM Pt feeling improved. Lung sounds improved. Will send down inhaler here so  he can have it to go home with. Rx for inhaler, prednisone, and zpak given for COPD exacerbation. Smoking cessation advised. F/up with [facility] in 1wk to establish care and recheck symptoms. I explained the diagnosis and have given explicit precautions to return to the ER including for any other new or worsening symptoms. The patient understands and accepts the  medical plan as it's been dictated and I have answered their questions. Discharge instructions concerning home care and prescriptions have been given. The patient is STABLE and is discharged to home in good condition.  BP 117/71 mmHg  Pulse 75  Temp(Src) 98 F (36.7 C) (Oral)  Resp 20  SpO2 96%  Meds ordered this encounter  Medications  . albuterol (PROVENTIL) (2.5 MG/3ML) 0.083% nebulizer solution 5 mg    Sig:   . albuterol (PROVENTIL) (2.5 MG/3ML) 0.083% nebulizer solution 5 mg    Sig:   . ipratropium (ATROVENT) nebulizer solution 0.5 mg    Sig:   . predniSONE (DELTASONE) tablet 60 mg    Sig:   . predniSONE (DELTASONE) 20 MG tablet    Sig: 3 tabs po daily x 3 days starting on 05/26/15    Dispense:  9 tablet    Refill:  0    Order Specific Question:  Supervising Provider    Answer:  Eber Hong [[id number]]  . azithromycin (ZITHROMAX Z-PAK) 250 MG tablet    Sig: 2 po day one, then 1 daily x 4 days    Dispense:  6 tablet    Refill:  0    Order Specific Question:  Supervising Provider    Answer:  MILLER, BRIAN [[id number]]  . albuterol (PROVENTIL HFA;VENTOLIN HFA) 108 (90 Base) MCG/ACT inhaler    Sig: Inhale 2 puffs into the lungs every 4 (four) hours as needed for wheezing or shortness of breath (cough).    Dispense:  1 Inhaler    Refill:  0    Order Specific Question:  Supervising Provider    Answer:  MILLER, BRIAN [[id number]]  . albuterol (PROVENTIL HFA;VENTOLIN HFA) 108 (90 Base) MCG/ACT inhaler 2 puff    Sig:        Richard Vanscyoc Camprubi-Soms, PA-C 05/26/15 0036  Jacalyn Lefevre, MD 05/27/15 [phone]

## 2015-05-26 MED ORDER — ALBUTEROL SULFATE HFA 108 (90 BASE) MCG/ACT IN AERS
2.0000 | INHALATION_SPRAY | Freq: Once | RESPIRATORY_TRACT | Status: AC
  Administered 2015-05-26: 2 via RESPIRATORY_TRACT
  Filled 2015-05-26: qty 6.7

## 2015-05-26 NOTE — Progress Notes (Signed)
Spacer teaching completed. RN at bedside.

## 2015-05-26 NOTE — Discharge Instructions (Signed)
Continue to stay well-hydrated. Continue to alternate between Tylenol and Ibuprofen for pain or fever. Use Mucinex for cough suppression/expectoration of mucus. Take over-the-counter Benadryl or other antihistamines (claritin, zyrtec, etc) to decrease allergy symptoms. Use inhaler as directed, as needed for cough/chest congestion/wheezing. Take prednisone as directed starting on 05/26/15. Take azithromycin as directed to help with your COPD/asthma exacerbation. STOP SMOKING! Followup with [facility] in 5-7 days for recheck of ongoing symptoms and to establish care. Return to emergency department for emergent changing or worsening of symptoms.   Chronic Obstructive Pulmonary Disease Exacerbation Chronic obstructive pulmonary disease (COPD) is a common lung condition in which airflow from the lungs is limited. COPD is a general term that can be used to describe many different lung problems that limit airflow, including chronic bronchitis and emphysema. COPD exacerbations are episodes when breathing symptoms become much worse and require extra treatment. Without treatment, COPD exacerbations can be life threatening, and frequent COPD exacerbations can cause further damage to your lungs. CAUSES  Respiratory infections.  Exposure to smoke.  Exposure to air pollution, chemical fumes, or dust. Sometimes there is no apparent cause or trigger. RISK FACTORS  Smoking cigarettes.  Older age.  Frequent prior COPD exacerbations. SIGNS AND SYMPTOMS  Increased coughing.  Increased thick spit (sputum) production.  Increased wheezing.  Increased shortness of breath.  Rapid breathing.  Chest tightness. DIAGNOSIS Your medical history, a physical exam, and tests will help your health care provider make a diagnosis. Tests may include:  A chest X-ray.  Basic lab tests.  Sputum testing.  An arterial blood gas test. TREATMENT Depending on the severity of your COPD exacerbation, you  may need to be admitted to a hospital for treatment. Some of the treatments commonly used to treat COPD exacerbations are:   Antibiotic medicines.  Bronchodilators. These are drugs that expand the air passages. They may be given with an inhaler or nebulizer. Spacer devices may be needed to help improve drug delivery.  Corticosteroid medicines.  Supplemental oxygen therapy.  Airway clearing techniques, such as noninvasive ventilation (NIV) and positive expiratory pressure (PEP). These provide respiratory support through a mask or other noninvasive device. HOME CARE INSTRUCTIONS  Do not smoke. Quitting smoking is very important to prevent COPD from getting worse and exacerbations from happening as often.  Avoid exposure to all substances that irritate the airway, especially to tobacco smoke.  If you were prescribed an antibiotic medicine, finish it all even if you start to feel better.  Take all medicines as directed by your health care provider.It is important to use correct technique with inhaled medicines.  Drink enough fluids to keep your urine clear or pale yellow (unless you have a medical condition that requires fluid restriction).  Use a cool mist vaporizer. This makes it easier to clear your chest when you cough.  If you have a home nebulizer and oxygen, continue to use them as directed.  Maintain all necessary vaccinations to prevent infections.  Exercise regularly.  Eat a healthy diet.  Keep all follow-up appointments as directed by your health care provider. SEEK IMMEDIATE MEDICAL CARE IF:  You have worsening shortness of breath.  You have trouble talking.  You have severe chest pain.  You have blood in your sputum.  You have a fever.  You have weakness, vomit repeatedly, or faint.  You feel confused.  You continue to get worse. MAKE SURE YOU:  Understand these instructions.  Will watch your condition.  Will get help right  away if you are not doing  well or get worse.   This information is not intended to replace advice given to you by your health care provider. Make sure you discuss any questions you have with your health care provider.   Document Released: 10/18/2006 Document Revised: 01/11/2014 Document Reviewed: 08/25/2012 Elsevier Interactive Patient Education 2016 [employer]  Asthma, Acute Bronchospasm Acute bronchospasm caused by asthma is also referred to as an asthma attack. Bronchospasm means your air passages become narrowed. The narrowing is caused by inflammation and tightening of the muscles in the air tubes (bronchi) in your lungs. This can make it hard to breathe or cause you to wheeze and cough. CAUSES Possible triggers are:  Animal dander from the skin, hair, or feathers of animals.  Dust mites contained in house dust.  Cockroaches.  Pollen from trees or grass.  Mold.  Cigarette or tobacco smoke.  Air pollutants such as dust, household cleaners, hair sprays, aerosol sprays, paint fumes, strong chemicals, or strong odors.  Cold air or weather changes. Cold air may trigger inflammation. Winds increase molds and pollens in the air.  Strong emotions such as crying or laughing hard.  Stress.  Certain medicines such as aspirin or beta-blockers.  Sulfites in foods and drinks, such as dried fruits and wine.  Infections or inflammatory conditions, such as a flu, cold, or inflammation of the nasal membranes (rhinitis).  Gastroesophageal reflux disease (GERD). GERD is a condition where stomach acid backs up into your esophagus.  Exercise or strenuous activity. SIGNS AND SYMPTOMS   Wheezing.  Excessive coughing, particularly at night.  Chest tightness.  Shortness of breath. DIAGNOSIS  Your health care provider will ask you about your medical history and perform a physical exam. A chest X-ray or blood testing may be performed to look for other causes of your symptoms or other conditions that may have  triggered your asthma attack. TREATMENT  Treatment is aimed at reducing inflammation and opening up the airways in your lungs. Most asthma attacks are treated with inhaled medicines. These include quick relief or rescue medicines (such as bronchodilators) and controller medicines (such as inhaled corticosteroids). These medicines are sometimes given through an inhaler or a nebulizer. Systemic steroid medicine taken by mouth or given through an IV tube also can be used to reduce the inflammation when an attack is moderate or severe. Antibiotic medicines are only used if a bacterial infection is present.  HOME CARE INSTRUCTIONS   Rest.  Drink plenty of liquids. This helps the mucus to remain thin and be easily coughed up. Only use caffeine in moderation and do not use alcohol until you have recovered from your illness.  Do not smoke. Avoid being exposed to secondhand smoke.  You play a critical role in keeping yourself in good health. Avoid exposure to things that cause you to wheeze or to have breathing problems.  Keep your medicines up-to-date and available. Carefully follow your health care provider's treatment plan.  Take your medicine exactly as prescribed.  When pollen or pollution is bad, keep windows closed and use an air conditioner or go to places with air conditioning.  Asthma requires careful medical care. See your health care provider for a follow-up as advised. If you are more than [redacted] weeks pregnant and you were prescribed any new medicines, let your obstetrician know about the visit and how you are doing. Follow up with your health care provider as directed.  After you have recovered from your asthma attack, make  an appointment with your outpatient doctor to talk about ways to reduce the likelihood of future attacks. If you do not have a doctor who manages your asthma, make an appointment with a primary care doctor to discuss your asthma. SEEK IMMEDIATE MEDICAL CARE IF:   You  are getting worse.  You have trouble breathing. If severe, call your local emergency services (911 in the [place]  You develop chest pain or discomfort.  You are vomiting.  You are not able to keep fluids down.  You are coughing up yellow, green, brown, or bloody sputum.  You have a fever and your symptoms suddenly get worse.  You have trouble swallowing. MAKE SURE YOU:   Understand these instructions.  Will watch your condition.  Will get help right away if you are not doing well or get worse.   This information is not intended to replace advice given to you by your health care provider. Make sure you discuss any questions you have with your health care provider.   Document Released: 04/07/2006 Document Revised: 12/26/2012 Document Reviewed: 06/28/2012 Elsevier Interactive Patient Education 2016 [employer].  How to Use an Inhaler Proper inhaler technique is very important. Good technique ensures that the medicine reaches the lungs. Poor technique results in depositing the medicine on the tongue and back of the throat rather than in the airways. If you do not use the inhaler with good technique, the medicine will not help you. STEPS TO FOLLOW IF USING AN INHALER WITHOUT AN EXTENSION TUBE  Remove the cap from the inhaler.  If you are using the inhaler for the first time, you will need to prime it. Shake the inhaler for 5 seconds and release four puffs into the air, away from your face. Ask your health care provider or pharmacist if you have questions about priming your inhaler.  Shake the inhaler for 5 seconds before each breath in (inhalation).  Position the inhaler so that the top of the canister faces up.  Put your index finger on the top of the medicine canister. Your thumb supports the bottom of the inhaler.  Open your mouth.  Either place the inhaler between your teeth and place your lips tightly around the mouthpiece, or hold the inhaler 1-2 inches away from your  open mouth. If you are unsure of which technique to use, ask your health care provider.  Breathe out (exhale) normally and as completely as possible.  Press the canister down with your index finger to release the medicine.  At the same time as the canister is pressed, inhale deeply and slowly until your lungs are completely filled. This should take 4-6 seconds. Keep your tongue down.  Hold the medicine in your lungs for 5-10 seconds (10 seconds is best). This helps the medicine get into the small airways of your lungs.  Breathe out slowly, through pursed lips. Whistling is an example of pursed lips.  Wait at least 15-30 seconds between puffs. Continue with the above steps until you have taken the number of puffs your health care provider has ordered. Do not use the inhaler more than your health care provider tells you.  Replace the cap on the inhaler.  Follow the directions from your health care provider or the inhaler insert for cleaning the inhaler. STEPS TO FOLLOW IF USING AN INHALER WITH AN EXTENSION (SPACER)  Remove the cap from the inhaler.  If you are using the inhaler for the first time, you will need to prime it. Shake the  inhaler for 5 seconds and release four puffs into the air, away from your face. Ask your health care provider or pharmacist if you have questions about priming your inhaler.  Shake the inhaler for 5 seconds before each breath in (inhalation).  Place the open end of the spacer onto the mouthpiece of the inhaler.  Position the inhaler so that the top of the canister faces up and the spacer mouthpiece faces you.  Put your index finger on the top of the medicine canister. Your thumb supports the bottom of the inhaler and the spacer.  Breathe out (exhale) normally and as completely as possible.  Immediately after exhaling, place the spacer between your teeth and into your mouth. Close your lips tightly around the spacer.  Press the canister down with your  index finger to release the medicine.  At the same time as the canister is pressed, inhale deeply and slowly until your lungs are completely filled. This should take 4-6 seconds. Keep your tongue down and out of the way.  Hold the medicine in your lungs for 5-10 seconds (10 seconds is best). This helps the medicine get into the small airways of your lungs. Exhale.  Repeat inhaling deeply through the spacer mouthpiece. Again hold that breath for up to 10 seconds (10 seconds is best). Exhale slowly. If it is difficult to take this second deep breath through the spacer, breathe normally several times through the spacer. Remove the spacer from your mouth.  Wait at least 15-30 seconds between puffs. Continue with the above steps until you have taken the number of puffs your health care provider has ordered. Do not use the inhaler more than your health care provider tells you.  Remove the spacer from the inhaler, and place the cap on the inhaler.  Follow the directions from your health care provider or the inhaler insert for cleaning the inhaler and spacer. If you are using different kinds of inhalers, use your quick relief medicine to open the airways 10-15 minutes before using a steroid if instructed to do so by your health care provider. If you are unsure which inhalers to use and the order of using them, ask your health care provider, nurse, or respiratory therapist. If you are using a steroid inhaler, always rinse your mouth with water after your last puff, then gargle and spit out the water. Do not swallow the water. AVOID:  Inhaling before or after starting the spray of medicine. It takes practice to coordinate your breathing with triggering the spray.  Inhaling through the nose (rather than the mouth) when triggering the spray. HOW TO DETERMINE IF YOUR INHALER IS FULL OR NEARLY EMPTY You cannot know when an inhaler is empty by shaking it. A few inhalers are now being made with dose counters.  Ask your health care provider for a prescription that has a dose counter if you feel you need that extra help. If your inhaler does not have a counter, ask your health care provider to help you determine the date you need to refill your inhaler. Write the refill date on a calendar or your inhaler canister. Refill your inhaler 7-10 days before it runs out. Be sure to keep an adequate supply of medicine. This includes making sure it is not expired, and that you have a spare inhaler.  SEEK MEDICAL CARE IF:   Your symptoms are only partially relieved with your inhaler.  You are having trouble using your inhaler.  You have some increase in phlegm.  SEEK IMMEDIATE MEDICAL CARE IF:   You feel little or no relief with your inhalers. You are still wheezing and are feeling shortness of breath or tightness in your chest or both.  You have dizziness, headaches, or a fast heart rate.  You have chills, fever, or night sweats.  You have a noticeable increase in phlegm production, or there is blood in the phlegm. MAKE SURE YOU:   Understand these instructions.  Will watch your condition.  Will get help right away if you are not doing well or get worse.   This information is not intended to replace advice given to you by your health care provider. Make sure you discuss any questions you have with your health care provider.   Document Released: 12/19/1999 Document Revised: 10/11/2012 Document Reviewed: 07/20/2012 Elsevier Interactive Patient Education 2016 [employer].  Smoking Cessation, Tips for Success If you are ready to quit smoking, congratulations! You have chosen to help yourself be healthier. Cigarettes bring nicotine, tar, carbon monoxide, and other irritants into your body. Your lungs, heart, and blood vessels will be able to work better without these poisons. There are many different ways to quit smoking. Nicotine gum, nicotine patches, a nicotine inhaler, or nicotine nasal spray can help  with physical craving. Hypnosis, support groups, and medicines help break the habit of smoking. WHAT THINGS CAN I DO TO MAKE QUITTING EASIER?  Here are some tips to help you quit for good:  Pick a date when you will quit smoking completely. Tell all of your friends and family about your plan to quit on that date.  Do not try to slowly cut down on the number of cigarettes you are smoking. Pick a quit date and quit smoking completely starting on that day.  Throw away all cigarettes.   Clean and remove all ashtrays from your home, work, and car.  On a card, write down your reasons for quitting. Carry the card with you and read it when you get the urge to smoke.  Cleanse your body of nicotine. Drink enough water and fluids to keep your urine clear or pale yellow. Do this after quitting to flush the nicotine from your body.  Learn to predict your moods. Do not let a bad situation be your excuse to have a cigarette. Some situations in your life might tempt you into wanting a cigarette.  Never have "just one" cigarette. It leads to wanting another and another. Remind yourself of your decision to quit.  Change habits associated with smoking. If you smoked while driving or when feeling stressed, try other activities to replace smoking. Stand up when drinking your coffee. Brush your teeth after eating. Sit in a different chair when you read the paper. Avoid alcohol while trying to quit, and try to drink fewer caffeinated beverages. Alcohol and caffeine may urge you to smoke.  Avoid foods and drinks that can trigger a desire to smoke, such as sugary or spicy foods and alcohol.  Ask people who smoke not to smoke around you.  Have something planned to do right after eating or having a cup of coffee. For example, plan to take a walk or exercise.  Try a relaxation exercise to calm you down and decrease your stress. Remember, you may be tense and nervous for the first 2 weeks after you quit, but this  will pass.  Find new activities to keep your hands busy. Play with a pen, coin, or rubber band. Doodle or draw things on paper.  Brush your teeth right after eating. This will help cut down on the craving for the taste of tobacco after meals. You can also try mouthwash.   Use oral substitutes in place of cigarettes. Try using lemon drops, carrots, cinnamon sticks, or chewing gum. Keep them handy so they are available when you have the urge to smoke.  When you have the urge to smoke, try deep breathing.  Designate your home as a nonsmoking area.  If you are a heavy smoker, ask your health care provider about a prescription for nicotine chewing gum. It can ease your withdrawal from nicotine.  Reward yourself. Set aside the cigarette money you save and buy yourself something nice.  Look for support from others. Join a support group or smoking cessation program. Ask someone at home or at work to help you with your plan to quit smoking.  Always ask yourself, "Do I need this cigarette or is this just a reflex?" Tell yourself, "Today, I choose not to smoke," or "I do not want to smoke." You are reminding yourself of your decision to quit.  Do not replace cigarette smoking with electronic cigarettes (commonly called e-cigarettes). The safety of e-cigarettes is unknown, and some may contain harmful chemicals.  If you relapse, do not give up! Plan ahead and think about what you will do the next time you get the urge to smoke. HOW WILL I FEEL WHEN I QUIT SMOKING? You may have symptoms of withdrawal because your body is used to nicotine (the addictive substance in cigarettes). You may crave cigarettes, be irritable, feel very hungry, cough often, get headaches, or have difficulty concentrating. The withdrawal symptoms are only temporary. They are strongest when you first quit but will go away within 10-14 days. When withdrawal symptoms occur, stay in control. Think about your reasons for quitting.  Remind yourself that these are signs that your body is healing and getting used to being without cigarettes. Remember that withdrawal symptoms are easier to treat than the major diseases that smoking can cause.  Even after the withdrawal is over, expect periodic urges to smoke. However, these cravings are generally short lived and will go away whether you smoke or not. Do not smoke! WHAT RESOURCES ARE AVAILABLE TO HELP ME QUIT SMOKING? Your health care provider can direct you to community resources or hospitals for support, which may include:  Group support.  Education.  Hypnosis.  Therapy.   This information is not intended to replace advice given to you by your health care provider. Make sure you discuss any questions you have with your health care provider.   Document Released: 09/19/2003 Document Revised: 01/11/2014 Document Reviewed: 06/08/2012 Elsevier Interactive Patient Education [employer]

## 2015-06-12 ENCOUNTER — Emergency Department (HOSPITAL_COMMUNITY)
Admission: EM | Admit: 2015-06-12 | Discharge: 2015-06-13 | Disposition: A | Discharge status: Home / Self Care | Payer: Self-pay | Attending: Emergency Medicine | Admitting: Emergency Medicine

## 2015-06-12 ENCOUNTER — Encounter (HOSPITAL_COMMUNITY): Payer: Self-pay | Admitting: Emergency Medicine

## 2015-06-12 ENCOUNTER — Emergency Department (HOSPITAL_COMMUNITY): Payer: Self-pay

## 2015-06-12 DIAGNOSIS — J45901 Unspecified asthma with (acute) exacerbation: Secondary | ICD-10-CM | POA: Insufficient documentation

## 2015-06-12 DIAGNOSIS — Z79899 Other long term (current) drug therapy: Secondary | ICD-10-CM | POA: Insufficient documentation

## 2015-06-12 DIAGNOSIS — F17210 Nicotine dependence, cigarettes, uncomplicated: Secondary | ICD-10-CM | POA: Insufficient documentation

## 2015-06-12 DIAGNOSIS — J449 Chronic obstructive pulmonary disease, unspecified: Secondary | ICD-10-CM | POA: Insufficient documentation

## 2015-06-12 LAB — CBC WITH DIFFERENTIAL/PLATELET
Basophils Absolute: 0 10*3/uL (ref 0.0–0.1)
Basophils Relative: 0 %
EOS PCT: 5 %
Eosinophils Absolute: 0.5 10*3/uL (ref 0.0–0.7)
HCT: 44.5 % (ref 39.0–52.0)
Hemoglobin: 15.7 g/dL (ref 13.0–17.0)
LYMPHS ABS: 2.1 10*3/uL (ref 0.7–4.0)
LYMPHS PCT: 21 %
MCH: 31.9 pg (ref 26.0–34.0)
MCHC: 35.3 g/dL (ref 30.0–36.0)
MCV: 90.4 fL (ref 78.0–100.0)
MONO ABS: 1.3 10*3/uL — AB (ref 0.1–1.0)
Monocytes Relative: 13 %
Neutro Abs: 5.9 10*3/uL (ref 1.7–7.7)
Neutrophils Relative %: 61 %
PLATELETS: 226 10*3/uL (ref 150–400)
RBC: 4.92 MIL/uL (ref 4.22–5.81)
RDW: 12.2 % (ref 11.5–15.5)
WBC: 9.8 10*3/uL (ref 4.0–10.5)

## 2015-06-12 LAB — I-STAT TROPONIN, ED: Troponin i, poc: 0 ng/mL (ref 0.00–0.08)

## 2015-06-12 MED ORDER — ALBUTEROL SULFATE (2.5 MG/3ML) 0.083% IN NEBU
5.0000 mg | INHALATION_SOLUTION | Freq: Once | RESPIRATORY_TRACT | Status: AC
  Administered 2015-06-12: 5 mg via RESPIRATORY_TRACT
  Filled 2015-06-12: qty 6

## 2015-06-12 NOTE — ED Notes (Addendum)
Pt was seen two weeks ago for asthma and SOB. Pt was given prednisone and nebulizer treatments. Pt has been using without relief. Pt having difficulty speaking in complete sentences. A&Ox4 and ambulatory. Pt c/o chest pain since last night. Pt sts coughing and lying down make chest pain worse. Pt sts he has a hx of anxiety which is making the SOB worse.

## 2015-06-12 NOTE — ED Provider Notes (Signed)
CSN: [id number]     Arrival date & time 06/12/15  2045 History   By signing my name below, I, Suzan Slick. Elon Spanner, attest that this documentation has been prepared under the direction and in the presence of Laurence Spates, MD.  Electronically Signed: Suzan Slick. Elon Spanner, ED Scribe. 06/12/2015. 11:56 PM.   Chief Complaint  Patient presents with  . Shortness of Breath  . Asthma   The history is provided by the patient. No language interpreter was used.    HPI Comments: Richard Fuentes is a 45 y.o. male with a PMHx of COPD, esophagitis, anxiety, and asthma who presents to the Emergency Department complaining of constant, worsening shortness of breath x 3 days. He also reports a dry cough, wheezing, and constant chest pain. Pt states chest pain is exacerbated when coughing without any alleviating factors. Pt was evaluated in the Emergency Department on 5/21 for an asthma exacerbation. At that time, pt was sent home with prescriptions for prednisone, antibiotics, and an inhaler. He admits he waited 5 days to start his prednisone but completed the whole course. He initially felt better after steroids but later his SOB symptoms returned. Pt is using his inhaler every 4 hours and using his nebulizer twice daily. [name] also admits to losing 30 pounds in the last few months which he attributes to not sleeping well, not eating well, and feeling ill the last several weeks. No recent fever, chills, rhinorrhea, nausea, or vomiting.  PCP: No PCP Per Patient    Past Medical History  Diagnosis Date  . COPD (chronic obstructive pulmonary disease) (HCC)   . Esophagitis   . Asthma   . Environmental allergies   . Hx of migraines   . OCD (obsessive compulsive disorder)   . Anxiety    Past Surgical History  Procedure Laterality Date  . Knee surgery     No family history on file. Social History  Substance Use Topics  . Smoking status: Current Every Day Smoker -- 1.00 packs/day    Types: Cigarettes  .  Smokeless tobacco: None  . Alcohol Use: No    Review of Systems   A complete 10 system review of systems was obtained and all systems are negative except as noted in the HPI and PMH.    Allergies  Penicillins  Home Medications   Prior to Admission medications   Medication Sig Start Date End Date Taking? Authorizing Provider  albuterol (PROVENTIL HFA;VENTOLIN HFA) 108 (90 BASE) MCG/ACT inhaler Inhale 1-2 puffs into the lungs every 6 (six) hours as needed for wheezing. 08/13/12  Yes Adlih Moreno-Coll, MD  budesonide-formoterol (SYMBICORT) 80-4.5 MCG/ACT inhaler Inhale 2 puffs into the lungs 2 (two) times daily. 06/13/15   Laurence Spates, MD  EPINEPHrine (EPIPEN) 0.3 mg/0.3 mL SOAJ injection Inject 0.3 mLs (0.3 mg total) into the muscle as needed. Patient not taking: Reported on 05/25/2015 08/13/12   Jimmie Molly, MD  ipratropium (ATROVENT HFA) 17 MCG/ACT inhaler Inhale 2 puffs into the lungs every 6 (six) hours. Patient not taking: Reported on 05/25/2015 01/03/12   Jimmie Molly, MD  predniSONE (DELTASONE) 20 MG tablet Take 2 tablets (40 mg total) by mouth daily. Take 40 mg by mouth daily for 3 days, then  by mouth daily for 3 days, then  daily for 3 days 06/13/15   Laurence Spates, MD   Triage Vitals: BP 119/82 mmHg  Pulse 114  Temp(Src) 98.2 F (36.8 C) (Oral)  Resp 17  SpO2 95%  Physical Exam  Constitutional: He is oriented to person, place, and time. He appears well-developed and well-nourished. No distress.  Anxious Frequently coughing  HENT:  Head: Normocephalic and atraumatic.  Moist mucous membranes  Eyes: Conjunctivae are normal. Pupils are equal, round, and reactive to light.  Neck: Neck supple.  Cardiovascular: Normal rate, regular rhythm and normal heart sounds.   No murmur heard. Pulmonary/Chest: Effort normal. He has wheezes.  Occasion expiratory wheezing bilaterally and diminished in bilateral bases   Abdominal: Soft. Bowel sounds are normal. He exhibits  no distension. There is no tenderness.  Musculoskeletal: He exhibits no edema.  Neurological: He is alert and oriented to person, place, and time.  Fluent speech  Skin: Skin is warm and dry.  Psychiatric:  Anxious and bizarre affect   Nursing note and vitals reviewed.   ED Course  Procedures (including critical care time)  DIAGNOSTIC STUDIES: Oxygen Saturation is 97% on RA, adequate by my interpretation.    COORDINATION OF CARE: 11:55 PM- Will order EKG, blood work, CXR and give breathing treatment. Will start pt back on steriods for a longer period of time then taper off. Will recommend follow up with a Pulmonologist. Discussed treatment plan with pt at bedside and pt agreed to plan.     Labs Review Labs Reviewed  BASIC METABOLIC PANEL - Abnormal; Notable for the following:    Creatinine, Ser 1.25 (*)    All other components within normal limits  CBC WITH DIFFERENTIAL/PLATELET - Abnormal; Notable for the following:    Monocytes Absolute 1.3 (*)    All other components within normal limits  I-STAT TROPOININ, ED    Imaging Review Dg Chest 2 View  06/12/2015  CLINICAL DATA:  Shortness of breath for the past 2 weeks. Increasing over time. EXAM: CHEST  2 VIEW COMPARISON:  05/25/2015 FINDINGS: The lungs are hyperinflated likely secondary to COPD. There is no focal parenchymal opacity. There is no pleural effusion or pneumothorax. The heart and mediastinal contours are unremarkable. The osseous structures are unremarkable. IMPRESSION: No active cardiopulmonary disease. Electronically Signed   By: Elige KoHetal  Patel   On: 06/12/2015 21:54   I have personally reviewed and evaluated these lab results as part of my medical decision-making.   EKG Interpretation   Date/Time:  Thursday June 12 2015 21:02:32 EDT Ventricular Rate:  92 PR Interval:  133 QRS Duration: 85 QT Interval:  361 QTC Calculation: 447 R Axis:   119 Text Interpretation:  Sinus rhythm Right atrial enlargement Right axis   deviation No significant change since last tracing Confirmed by Makyiah Lie MD,  Aubrie Lucien [phone]) on 06/12/2015 11:14:32 PM     Medications  albuterol (PROVENTIL) (2.5 MG/3ML) 0.083% nebulizer solution 5 mg (5 mg Nebulization Given 06/12/15 2115)  predniSONE (DELTASONE) tablet 60 mg (60 mg Oral Given 06/13/15 0028)  ipratropium-albuterol (DUONEB) 0.5-2.5 (3) MG/3ML nebulizer solution 3 mL (3 mLs Nebulization Given 06/13/15 0021)    MDM   Final diagnoses:  Asthma exacerbation  Pt w/ history of asthma, no formal testing for COPD, presents with ongoing shortness of breath and chest pain for the past day. He was anxious on exam but with reassuring vital signs, normal O2 saturations on room air. Some expiratory wheezes noted with diminished breath sounds. EKG unchanged from previous. CXR negative acute. Gave patient DuoNeb and prednisone. Basic lab work unremarkable, troponin negative. Given that his chest pain has been constant and worse with coughing, I suspect that it is related to his pulmonary symptoms and feel  ACS is extremely unlikely. No risk factors for PE. Patient has significant component of anxiety related to his symptoms and his work of breathing is visibly worse as he becomes more anxious during conversation. Because of his wheezing on exam, will treat with prednisone taper and instructed on supportive care including continued albuterol. Provided him with PCP follow-up information as I explained that he would benefit from PCP and possibly pulmonology referral for PFTs. Discussed return precautions and patient discharged in satisfactory condition.  I personally performed the services described in this documentation, which was scribed in my presence. The recorded information has been reviewed and is accurate.   Laurence Spates, MD 06/13/15 [phone]

## 2015-06-13 LAB — BASIC METABOLIC PANEL
Anion gap: 8 (ref 5–15)
BUN: 17 mg/dL (ref 6–20)
CO2: 27 mmol/L (ref 22–32)
CREATININE: 1.25 mg/dL — AB (ref 0.61–1.24)
Calcium: 9.1 mg/dL (ref 8.9–10.3)
Chloride: 102 mmol/L (ref 101–111)
GFR calc Af Amer: >60 mL/min (ref >60)
GLUCOSE: 95 mg/dL (ref 65–99)
POTASSIUM: 4.1 mmol/L (ref 3.5–5.1)
SODIUM: 137 mmol/L (ref 135–145)

## 2015-06-13 MED ORDER — IPRATROPIUM-ALBUTEROL 0.5-2.5 (3) MG/3ML IN SOLN
3.0000 mL | Freq: Once | RESPIRATORY_TRACT | Status: AC
  Administered 2015-06-13: 3 mL via RESPIRATORY_TRACT
  Filled 2015-06-13: qty 3

## 2015-06-13 MED ORDER — BUDESONIDE-FORMOTEROL FUMARATE 80-4.5 MCG/ACT IN AERO: 2.0000 | INHALATION_SPRAY | Freq: Two times a day (BID) | RESPIRATORY_TRACT | Status: DC

## 2015-06-13 MED ORDER — PREDNISONE 20 MG PO TABS: 40.0000 mg | ORAL_TABLET | Freq: Every day | ORAL | Status: DC

## 2015-06-13 MED ORDER — PREDNISONE 20 MG PO TABS
60.0000 mg | ORAL_TABLET | Freq: Once | ORAL | Status: AC
  Administered 2015-06-13: 60 mg via ORAL
  Filled 2015-06-13: qty 3

## 2015-06-13 NOTE — Discharge Instructions (Signed)
Asthma, Adult Asthma is a recurring condition in which the airways tighten and narrow. Asthma can make it difficult to breathe. It can cause coughing, wheezing, and shortness of breath. Asthma episodes, also called asthma attacks, range from minor to life-threatening. Asthma cannot be cured, but medicines and lifestyle changes can help control it. CAUSES Asthma is believed to be caused by inherited (genetic) and environmental factors, but its exact cause is unknown. Asthma may be triggered by allergens, lung infections, or irritants in the air. Asthma triggers are different for each person. Common triggers include:   Animal dander.  Dust mites.  Cockroaches.  Pollen from trees or grass.  Mold.  Smoke.  Air pollutants such as dust, household cleaners, hair sprays, aerosol sprays, paint fumes, strong chemicals, or strong odors.  Cold air, weather changes, and winds (which increase molds and pollens in the air).  Strong emotional expressions such as crying or laughing hard.  Stress.  Certain medicines (such as aspirin) or types of drugs (such as beta-blockers).  Sulfites in foods and drinks. Foods and drinks that may contain sulfites include dried fruit, potato chips, and sparkling grape juice.  Infections or inflammatory conditions such as the flu, a cold, or an inflammation of the nasal membranes (rhinitis).  Gastroesophageal reflux disease (GERD).  Exercise or strenuous activity. SYMPTOMS Symptoms may occur immediately after asthma is triggered or many hours later. Symptoms include:  Wheezing.  Excessive nighttime or early morning coughing.  Frequent or severe coughing with a common cold.  Chest tightness.  Shortness of breath. DIAGNOSIS  The diagnosis of asthma is made by a review of your medical history and a physical exam. Tests may also be performed. These may include:  Lung function studies. These tests show how much air you breathe in and out.  Allergy  tests.  Imaging tests such as X-rays. TREATMENT  Asthma cannot be cured, but it can usually be controlled. Treatment involves identifying and avoiding your asthma triggers. It also involves medicines. There are 2 classes of medicine used for asthma treatment:   Controller medicines. These prevent asthma symptoms from occurring. They are usually taken every day.  Reliever or rescue medicines. These quickly relieve asthma symptoms. They are used as needed and provide short-term relief. Your health care provider will help you create an asthma action plan. An asthma action plan is a written plan for managing and treating your asthma attacks. It includes a list of your asthma triggers and how they may be avoided. It also includes information on when medicines should be taken and when their dosage should be changed. An action plan may also involve the use of a device called a peak flow meter. A peak flow meter measures how well the lungs are working. It helps you monitor your condition. HOME CARE INSTRUCTIONS   Take medicines only as directed by your health care provider. Speak with your health care provider if you have questions about how or when to take the medicines.  Use a peak flow meter as directed by your health care provider. Record and keep track of readings.  Understand and use the action plan to help minimize or stop an asthma attack without needing to seek medical care.  Control your home environment in the following ways to help prevent asthma attacks:  Do not smoke. Avoid being exposed to secondhand smoke.  Change your heating and air conditioning filter regularly.  Limit your use of fireplaces and wood stoves.  Get rid of pests (such as roaches  and mice) and their droppings.  Throw away plants if you see mold on them.  Clean your floors and dust regularly. Use unscented cleaning products.  Try to have someone else vacuum for you regularly. Stay out of rooms while they are  being vacuumed and for a short while afterward. If you vacuum, use a dust mask from a hardware store, a double-layered or microfilter vacuum cleaner bag, or a vacuum cleaner with a HEPA filter.  Replace carpet with wood, tile, or vinyl flooring. Carpet can trap dander and dust.  Use allergy-proof pillows, mattress covers, and box spring covers.  Wash bed sheets and blankets every week in hot water and dry them in a dryer.  Use blankets that are made of polyester or cotton.  Clean bathrooms and kitchens with bleach. If possible, have someone repaint the walls in these rooms with mold-resistant paint. Keep out of the rooms that are being cleaned and painted.  Wash hands frequently. SEEK MEDICAL CARE IF:   You have wheezing, shortness of breath, or a cough even if taking medicine to prevent attacks.  The colored mucus you cough up (sputum) is thicker than usual.  Your sputum changes from clear or white to yellow, green, gray, or bloody.  You have any problems that may be related to the medicines you are taking (such as a rash, itching, swelling, or trouble breathing).  You are using a reliever medicine more than 2-3 times per week.  Your peak flow is still at 50-79% of your personal best after following your action plan for 1 hour.  You have a fever. SEEK IMMEDIATE MEDICAL CARE IF:   You seem to be getting worse and are unresponsive to treatment during an asthma attack.  You are short of breath even at rest.  You get short of breath when doing very Jamall Strohmeier physical activity.  You have difficulty eating, drinking, or talking due to asthma symptoms.  You develop chest pain.  You develop a fast heartbeat.  You have a bluish color to your lips or fingernails.  You are light-headed, dizzy, or faint.  Your peak flow is less than 50% of your personal best.   This information is not intended to replace advice given to you by your health care provider. Make sure you discuss any  questions you have with your health care provider. [employer] 211 is a great source of information about community services available.  Access by dialing 2-1-1 from anywhere in [place], or by website -  PooledIncome.pl.   Other Local Resources (Updated 01/2015)  Financial Assistance   Services    Phone Number and Address  [facility]  Low-cost medical care - 1st and 3rd Saturday of every month  Must not qualify for public or private insurance and must have limited income [phone] [address] [place], [place]    [place] [employer] of Social Services  Child care  Emergency assistance for housing and [employer]  Medicaid [phone] [address] [place], [place] [address]   [facility]  Low-cost medical care for children, communicable diseases, sexually-transmitted diseases, immunizations, maternity care, womens health and family planning [phone] [address] [place], [place] [address]  [facility]   Medication assistance for [facility] residents  Must meet income requirements [phone] [address] [place], [place].    [facility] Social Services  Child care  Emergency assistance for  housing and [employer]  Medicaid [phone] [address] [place], [place] [address]  [facility]   Low-cost medical care,   Monday through Friday, 9 am to 6 pm.   Accepts Medicare/Medicaid, and self-pay [phone] [address] [place], [place] [address]  [facility]  Low-cost medical care - Monday through Friday, 8:30 am - 5:30 pm  Accepts Medicaid and self-pay [phone] [address] [place], [place] [address]   [facility]  Primary medical care, including for  those with sickle cell disease  Accepts Medicare, Medicaid, insurance and self-pay [phone] [address] [place], [place]  [facility]   Primary medical care  Accepts Medicare, [place], insurance and self-pay [phone] [place], [place] [address]   [facility] Department of Social Services  Child care  Emergency assistance for housing and [employer]  Medicaid [phone] [address] [place], [place] [address]  [facility] Department of Health and [employer]  Child care  Emergency assistance for housing and [employer]  Medicaid [phone] [address] [place], [place] [address]   [facility] Medication Assistance Program  Medication assistance for [facility] residents with no insurance only  Must have a primary care doctor [phone] [address] [place], [place]  [facility]   Primary medical care  [place], [place], insurance  [phone] [address] [place], [place]  [facility]   Medication assistance [phone]  [facility]   Primary medical care  Accepts Medicare, [place], insurance and self-pay [phone] [address] [place], [place] [address]  [facility]   Primary medical care  Accepts Medicare, [place], insurance and self-pay [phone] [address] [place], [place] [address]  [facility]  For [place] residents between the ages of 40 and 32 who do not have any form of health insurance, Medicare, [place], or [place] benefits.  Services are provided free of charge to uninsured patients who fall within federal poverty guidelines.    Hours: Tuesdays and Thursdays, 4:15 - 8 pm [phone] [address] [place], [place] [address]  [facility]     Primary medical care  Dental care  Nutritional  counseling  Pharmacy  Accepts Medicaid, Medicare, most insurance.  Fees are adjusted based on ability to pay.   [phone] [facility] [address] [place], [place]  [phone] [facility] [address] [place], [place]  [phone] [facility] [place], [place]  [phone] [facility], [address] [place], [place]  [phone] [facility] [address] [place], [place]  [facility]  Womens health and family planning [phone] [address] [place], [place]  [facility] Department of Social Services  Child care  Emergency assistance for housing and [employer]  Medicaid [phone] [address] [place], [place] [address]   [facility]    Ages 44 and older  Hours: Mondays and Thursdays, 7:00 am - 9:00 am Patients are seen on a first come, first served basis. [phone], ext. [phone] [place], [place]  [facility] Division of Social Services  Child care  Emergency assistance for housing and [employer]  Medicaid [id number] [address] [place], [place] [address]  [facility]  [employer]  Medication assistance  Rental assistance  Food pantry  Medication assistance  Housing assistance  Emergency food distribution  Utility assistance [phone] [address] [place], [place]  [phone] [phone]  [address] [place], [place] [id number] Hours: Tuesdays and Thursdays from 9am - 12 noon by appointment only  [phone] [address] [place], [place] [id number]  [facility] - Lanae Boastlara F. Gunn   Accepts private insurance, [place], and [place].  Payment is based on a sliding scale for those without insurance.  Hours: Mondays, Tuesdays and Thursdays, 8:30 am - 5:30 pm.   [phone] [address] [place], [place]   [facility] - [facility] at [facility]    Accepts private insurance, [place], and [place].  Payment is based on a sliding scale for those without insurance. [phone] [address] [place], [place]  [facility] - Pediatrics at [address] Scientist, research (physical sciences)[place]  Accepts private insurance, [employer]icare, and [place].  Payment is based on a sliding scale for those without insurance [phone] [address], [place], [place]  [facility] - [facility] at [facility]  Accepts private insurance, [place], and [place].  Payment is based on a sliding scale for those without insurance. [phone] [address] [place], [place]  [facility] - [facility] at [facility]  Accepts private insurance, [place], and [place].  Payment is based on a sliding scale for those without insurance. [phone], ext. [phone] [place], [place].    [facility]  Maternity care.  Accepts Medicaid and self-pay. [phone] 253 6089 [address] [place], [place]     Document Released: 12/21/2004 Document Revised: 09/11/2014 Document Reviewed: 07/20/2012 Elsevier Interactive Patient Education [employer]2016 Elsevier Inc.

## 2015-06-13 NOTE — ED Notes (Signed)
Pt removed his chest leads.

## 2017-02-04 DIAGNOSIS — K4030 Unilateral inguinal hernia, with obstruction, without gangrene, not specified as recurrent: Secondary | ICD-10-CM | POA: Insufficient documentation

## 2017-10-09 IMAGING — CR DG CHEST 2V
2 series · 2 of 2 positions shown · non-contrast
Comparison: Prior chest x-ray 01/03/2012

CLINICAL DATA: 44-year-old male with 3 day history of cough.
Clinical history of asthma.

EXAM:
CHEST  2 VIEW

[w chest pa]
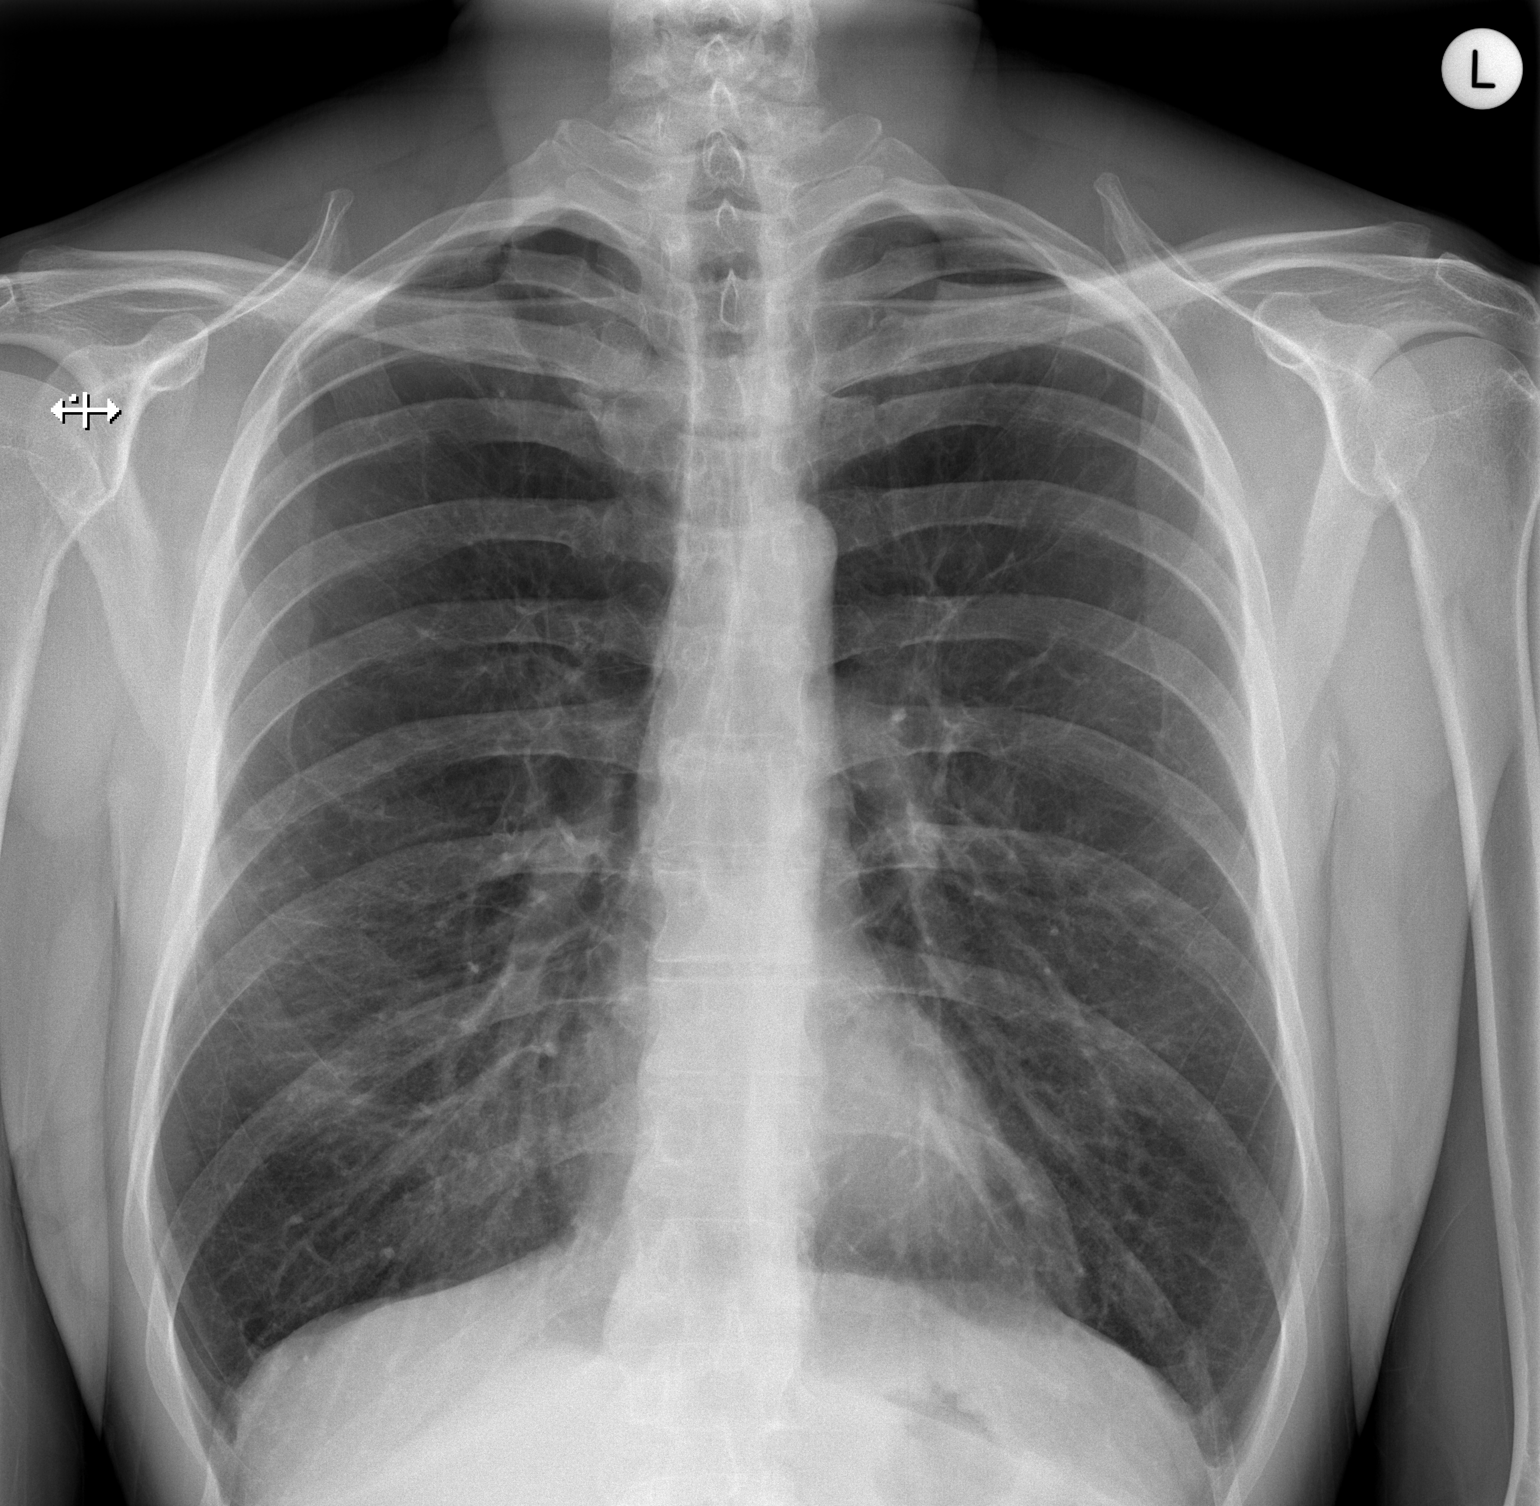

[w chest lat]
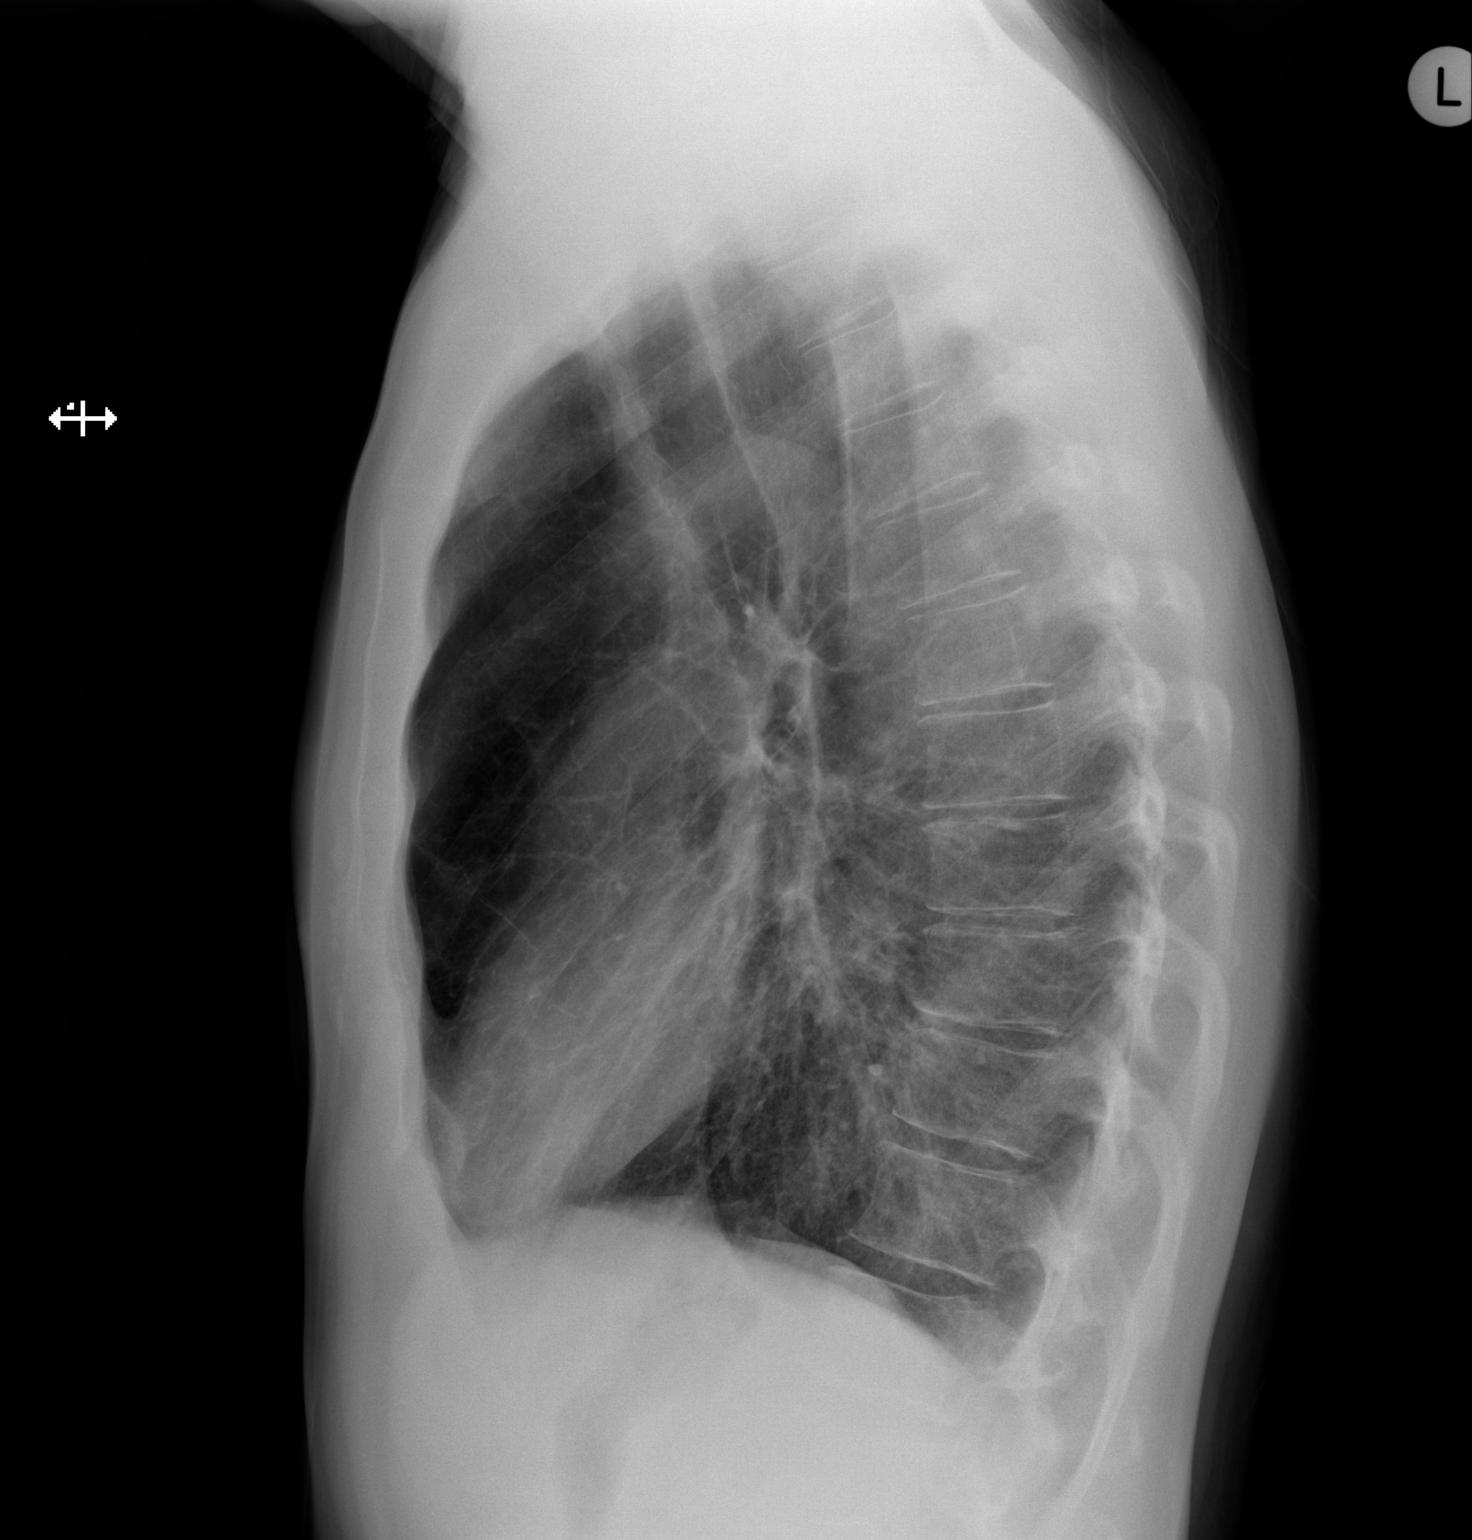

[2 of 2 positions shown; findings below may reference images not displayed]

FINDINGS: Stable cardiac and mediastinal contours. Similar chronic bronchitic
change and minimal interstitial prominence. No focal airspace
consolidation, pulmonary edema, pleural effusion or pneumothorax.
The lungs are hyperinflated.
IMPRESSION: Stable chest x-ray without evidence of active cardiopulmonary
disease.

## 2017-12-05 ENCOUNTER — Ambulatory Visit: Payer: Self-pay | Admitting: Family Medicine

## 2017-12-06 ENCOUNTER — Ambulatory Visit: Payer: Self-pay | Admitting: Family Medicine

## 2017-12-07 ENCOUNTER — Encounter: Payer: Self-pay | Admitting: Nurse Practitioner

## 2017-12-07 ENCOUNTER — Other Ambulatory Visit: Payer: Self-pay

## 2017-12-07 ENCOUNTER — Ambulatory Visit: Payer: Self-pay | Attending: Nurse Practitioner | Admitting: Nurse Practitioner

## 2017-12-07 VITALS — BP 109/57 | HR 84 | Temp 98.3°F | Resp 14 | Ht 73.5 in | Wt 179.6 lb

## 2017-12-07 DIAGNOSIS — F411 Generalized anxiety disorder: Secondary | ICD-10-CM | POA: Insufficient documentation

## 2017-12-07 DIAGNOSIS — J439 Emphysema, unspecified: Secondary | ICD-10-CM | POA: Insufficient documentation

## 2017-12-07 DIAGNOSIS — F17210 Nicotine dependence, cigarettes, uncomplicated: Secondary | ICD-10-CM | POA: Insufficient documentation

## 2017-12-07 DIAGNOSIS — Z0000 Encounter for general adult medical examination without abnormal findings: Secondary | ICD-10-CM

## 2017-12-07 DIAGNOSIS — Z79899 Other long term (current) drug therapy: Secondary | ICD-10-CM | POA: Insufficient documentation

## 2017-12-07 DIAGNOSIS — Z7951 Long term (current) use of inhaled steroids: Secondary | ICD-10-CM | POA: Insufficient documentation

## 2017-12-07 DIAGNOSIS — F17200 Nicotine dependence, unspecified, uncomplicated: Secondary | ICD-10-CM

## 2017-12-07 DIAGNOSIS — F429 Obsessive-compulsive disorder, unspecified: Secondary | ICD-10-CM | POA: Insufficient documentation

## 2017-12-07 DIAGNOSIS — J441 Chronic obstructive pulmonary disease with (acute) exacerbation: Secondary | ICD-10-CM | POA: Insufficient documentation

## 2017-12-07 DIAGNOSIS — Z880 Allergy status to penicillin: Secondary | ICD-10-CM | POA: Insufficient documentation

## 2017-12-07 MED ORDER — MONTELUKAST SODIUM 10 MG PO TABS: 10.0000 mg | ORAL_TABLET | Freq: Every day | ORAL | 3 refills | Status: DC

## 2017-12-07 MED ORDER — ALBUTEROL SULFATE HFA 108 (90 BASE) MCG/ACT IN AERS: 1.0000 | INHALATION_SPRAY | Freq: Four times a day (QID) | RESPIRATORY_TRACT | 1 refills | Status: DC | PRN

## 2017-12-07 MED ORDER — BUDESONIDE-FORMOTEROL FUMARATE 160-4.5 MCG/ACT IN AERO
2.0000 | INHALATION_SPRAY | Freq: Two times a day (BID) | RESPIRATORY_TRACT | 6 refills | Status: DC
Start: 2017-12-07 — End: 2018-01-25

## 2017-12-07 MED FILL — MONTELUKAST SOD 10 MG TAB: 10 | 30 days supply | Qty: 30 | Fill #0

## 2017-12-07 MED FILL — SYMBICORT 160-4.5 MCG INH: 160-4.5 | 25 days supply | Qty: 10 | Fill #0

## 2017-12-07 MED FILL — !VENTOLIN HFA INHALER: 108 (90 BAS | 30 days supply | Qty: 18 | Fill #0

## 2017-12-07 NOTE — Progress Notes (Signed)
Assessment & Plan:  Diagnoses and all orders for this visit:  COPD exacerbation ([place]) -     budesonide-formoterol (SYMBICORT) 160-4.5 MCG/ACT inhaler; Inhale 2 puffs into the lungs 2 (two) times daily. -     albuterol (PROVENTIL HFA;VENTOLIN HFA) 108 (90 Base) MCG/ACT inhaler; Inhale 1-2 puffs into the lungs every 6 (six) hours as needed for wheezing or shortness of breath (COUGH). -     montelukast (SINGULAIR) 10 MG tablet; Take 1 tablet (10 mg total) by mouth at bedtime.  Tobacco dependence Rasool was counseled on the dangers of tobacco use, and was advised to quit. Reviewed strategies to maximize success, including removing cigarettes and smoking materials from environment, stress management and support of family/friends as well as pharmacological alternatives including: Wellbutrin, Chantix, Nicotine patch, Nicotine gum or lozenges. Smoking cessation support: smoking cessation hotline: 1-800-QUIT-NOW.  Smoking cessation classes are also available through [facility] and [facility]. Call [phone] or visit our website at [email or web address]   A total of 3 minutes was spent on counseling for smoking cessation and [name] is not ready to quit.   Generalized anxiety disorder  Routine adult health maintenance -     CBC -     CMP14+EGFR    Patient has been counseled on age-appropriate routine health concerns for screening and prevention. These are reviewed and up-to-date. Referrals have been placed accordingly. Immunizations are up-to-date or declined.    Subjective:   HPI Richard Fuentes 47 y.o. male presents to office today to establish care. He has a history of COPD, psoriasis(he states creams don't), Anxiety (no longer takes Klonopin) and OCD. He is requesting steroids and antibiotics today for COPD exacerbation however I have explained to him that based on my clinical exam he does not present with any COPD symptoms of exacerbation. He has not had a PFT or spirometry performed  however xrays in the past have shown hyperinflation likely from COPD. He continues to smoke up to 1 ppd.   COPD He has utilized the emergency department to have his inhalers refilled as well as for his exacerbation symptoms. He has not been under the care of a PCP nor has he been evaluated by a pulmonologist. He has been using old symbicort inhalers from over a year ago. Will refill today and I have also added singulair as he endorses increased cough at night. He is reluctant to take as he states he does not like taking pills.  Patient has been advised to apply for financial assistance and schedule to see our financial counselor.   GAD  His PHQ9 and GAD scores are elevated. He declines an SSRI or anxiolytic today. States his anxiety has improved since he stopped taking Klonopin and he has a hard time taking medications by mouth so he would like to limit the amount of medications he takes.  GAD 7 : Generalized Anxiety Score 12/07/2017  Nervous, Anxious, on Edge 3  Control/stop worrying 2  Worry too much - different things 2  Trouble relaxing 3  Restless 2  Easily annoyed or irritable 2  Afraid - awful might happen 2  Total GAD 7 Score 16    Depression screen PHQ 2/9 12/07/2017  Decreased Interest 2  Down, Depressed, Hopeless 2  PHQ - 2 Score 4  Altered sleeping 3  Tired, decreased energy 3  Change in appetite 3  Feeling bad or failure about yourself  2  Trouble concentrating 2  Moving slowly or fidgety/restless 2  Suicidal thoughts 0  PHQ-9 Score 19   Review of Systems  Constitutional: Negative for fever, malaise/fatigue and weight loss.  HENT: Negative.  Negative for nosebleeds.   Eyes: Negative.  Negative for blurred vision, double vision and photophobia.  Respiratory: Positive for cough, sputum production, shortness of breath and wheezing.   Cardiovascular: Negative.  Negative for chest pain, palpitations and leg swelling.  Gastrointestinal: Negative.  Negative for heartburn,  nausea and vomiting.  Musculoskeletal: Negative.  Negative for myalgias.  Neurological: Negative.  Negative for dizziness, focal weakness, seizures and headaches.  Psychiatric/Behavioral: Positive for depression. Negative for suicidal ideas. The patient is nervous/anxious.     Past Medical History:  Diagnosis Date  . Anxiety   . Asthma   . COPD (chronic obstructive pulmonary disease) ([place])   . Environmental allergies   . Esophagitis   . Hx of migraines   . OCD (obsessive compulsive disorder)     Past Surgical History:  Procedure Laterality Date  . KNEE SURGERY      No family history on file.  Social History Reviewed with no changes to be made today.   Outpatient Medications Prior to Visit  Medication Sig Dispense Refill  . albuterol (PROVENTIL HFA;VENTOLIN HFA) 108 (90 Base) MCG/ACT inhaler Inhale into the lungs every 6 (six) hours as needed for wheezing or shortness of breath.    . budesonide-formoterol (SYMBICORT) 160-4.5 MCG/ACT inhaler Inhale 2 puffs into the lungs 2 (two) times daily.     No facility-administered medications prior to visit.     Allergies  Allergen Reactions  . Penicillins Other (See Comments)    Childhood allergy  Has patient had a PCN reaction causing immediate rash, facial/tongue/throat swelling, SOB or lightheadedness with hypotension: n/a Has patient had a PCN reaction causing severe rash involving mucus membranes or skin necrosis: n/a Has patient had a PCN reaction that required hospitalization: n/a Has patient had a PCN reaction occurring within the last 10 years: n/a If all of the above answers are "NO", then may proceed with Cephalosporin use.        Objective:    BP (!) 109/57   Pulse 84   Temp 98.3 F (36.8 C) (Oral)   Ht 6' 1.5" (1.867 m)   Wt 179 lb 9.6 oz (81.5 kg)   SpO2 93%   BMI 23.37 kg/m  Wt Readings from Last 3 Encounters:  12/07/17 179 lb 9.6 oz (81.5 kg)  01/06/12 179 lb 9.6 oz (81.5 kg)    Physical Exam    Constitutional: He is oriented to person, place, and time. He appears well-developed and well-nourished. He is cooperative. He is not intubated.  HENT:  Head: Normocephalic and atraumatic.  Eyes: EOM are normal.  Neck: Normal range of motion.  Cardiovascular: Normal rate, regular rhythm and normal heart sounds. Exam reveals no gallop and no friction rub.  No murmur heard. Pulmonary/Chest: Effort normal and breath sounds normal. No accessory muscle usage or stridor. Tachypnea noted. No bradypnea. He is not intubated. No respiratory distress. He has no decreased breath sounds. He has no wheezes. He has no rhonchi. He has no rales. He exhibits no tenderness.  No audible wheezing, no visible signs of respiratory distress. Patient does not exhibit any cough the entire time he is in the exam room with me.    Abdominal: Soft. Bowel sounds are normal.  Musculoskeletal: Normal range of motion. He exhibits no edema.  Neurological: He is alert and oriented to person, place, and time. Coordination normal.  Skin: Skin is  warm and dry.  Psychiatric: His behavior is normal. Judgment and thought content normal. His mood appears anxious. His speech is rapid and/or pressured. Cognition and memory are normal. He expresses no homicidal and no suicidal ideation. He expresses no suicidal plans and no homicidal plans.  Nursing note and vitals reviewed.        Patient has been counseled extensively about nutrition and exercise as well as the importance of adherence with medications and regular follow-up. The patient was given clear instructions to go to ER or return to medical center if symptoms don't improve, worsen or new problems develop. The patient verbalized understanding.   Follow-up: Return for Needs appointment with financial representative.Gildardo Pounds, FNP-BC [facility] and [facility] [place], [place]   12/11/2017, 8:39 PM

## 2017-12-07 NOTE — Patient Instructions (Addendum)
Chronic Obstructive Pulmonary Disease Chronic obstructive pulmonary disease (COPD) is a long-term (chronic) lung problem. When you have COPD, it is hard for air to get in and out of your lungs. The way your lungs work will never return to normal. Usually the condition gets worse over time. There are things you can do to keep yourself as healthy as possible. Your doctor may treat your condition with:  Medicines.  Quitting smoking, if you smoke.  Rehabilitation. This may involve a team of specialists.  Oxygen.  Exercise and changes to your diet.  Lung surgery.  Comfort measures (palliative care).  Follow these instructions at home: Medicines  Take over-the-counter and prescription medicines only as told by your doctor.  Talk to your doctor before taking any cough or allergy medicines. You may need to avoid medicines that cause your lungs to be dry. Lifestyle  If you smoke, stop. Smoking makes the problem worse. If you need help quitting, ask your doctor.  Avoid being around things that make your breathing worse. This may include smoke, chemicals, and fumes.  Stay active, but remember to also rest.  Learn and use tips on how to relax.  Make sure you get enough sleep. Most adults need at least 7 hours a night.  Eat healthy foods. Eat smaller meals more often. Rest before meals. Controlled breathing  Learn and use tips on how to control your breathing as told by your doctor. Try: ? Breathing in (inhaling) through your nose for 1 second. Then, pucker your lips and breath out (exhale) through your lips for 2 seconds. ? Putting one hand on your belly (abdomen). Breathe in slowly through your nose for 1 second. Your hand on your belly should move out. Pucker your lips and breathe out slowly through your lips. Your hand on your belly should move in as you breathe out. Controlled coughing  Learn and use controlled coughing to clear mucus from your lungs. The steps are: 1. Lean your  head a little forward. 2. Breathe in deeply. 3. Try to hold your breath for 3 seconds. 4. Keep your mouth slightly open while coughing 2 times. 5. Spit any mucus out into a tissue. 6. Rest and do the steps again 1 or 2 times as needed. General instructions  Make sure you get all the shots (vaccines) that your doctor recommends. Ask your doctor about a flu shot and a pneumonia shot.  Use oxygen therapy and therapy to help improve your lungs (pulmonary rehabilitation) if told by your doctor. If you need home oxygen therapy, ask your doctor if you should buy a tool to measure your oxygen level (oximeter).  Make a COPD action plan with your doctor. This helps you know what to do if you feel worse than usual.  Manage any other conditions you have as told by your doctor.  Avoid going outside when it is very hot, cold, or humid.  Avoid people who have a sickness you can catch (contagious).  Keep all follow-up visits as told by your doctor. This is important. Contact a doctor if:  You cough up more mucus than usual.  There is a change in the color or thickness of the mucus.  It is harder to breathe than usual.  Your breathing is faster than usual.  You have trouble sleeping.  You need to use your medicines more often than usual.  You have trouble doing your normal activities such as getting dressed or walking around the house. Get help right away if:    You have shortness of breath while resting.  You have shortness of breath that stops you from: ? Being able to talk. ? Doing normal activities.  Your chest hurts for longer than 5 minutes.  Your skin color is more blue than usual.  Your pulse oximeter shows that you have low oxygen for longer than 5 minutes.  You have a fever.  You feel too tired to breathe normally. Summary  Chronic obstructive pulmonary disease (COPD) is a long-term lung problem.  The way your lungs work will never return to normal. Usually the  condition gets worse over time. There are things you can do to keep yourself as healthy as possible.  Take over-the-counter and prescription medicines only as told by your doctor.  If you smoke, stop. Smoking makes the problem worse. This information is not intended to replace advice given to you by your health care provider. Make sure you discuss any questions you have with your health care provider. Document Released: 06/09/2007 Document Revised: 05/29/2015 Document Reviewed: 08/17/2012 Elsevier Interactive Patient Education  2017 [employer]  Steps to Quit Smoking Smoking tobacco can be bad for your health. It can also affect almost every organ in your body. Smoking puts you and people around you at risk for many serious long-lasting (chronic) diseases. Quitting smoking is hard, but it is one of the best things that you can do for your health. It is never too late to quit. What are the benefits of quitting smoking? When you quit smoking, you lower your risk for getting serious diseases and conditions. They can include:  Lung cancer or lung disease.  Heart disease.  Stroke.  Heart attack.  Not being able to have children (infertility).  Weak bones (osteoporosis) and broken bones (fractures).  If you have coughing, wheezing, and shortness of breath, those symptoms may get better when you quit. You may also get sick less often. If you are pregnant, quitting smoking can help to lower your chances of having a baby of low birth weight. What can I do to help me quit smoking? Talk with your doctor about what can help you quit smoking. Some things you can do (strategies) include:  Quitting smoking totally, instead of slowly cutting back how much you smoke over a period of time.  Going to in-person counseling. You are more likely to quit if you go to many counseling sessions.  Using resources and support systems, such as: ? Agricultural engineernline chats with a Veterinary surgeoncounselor. ? Phone quitlines. ? Estate manager/land agentrinted  self-help materials. ? Support groups or group counseling. ? Text messaging programs. ? Mobile phone apps or applications.  Taking medicines. Some of these medicines may have nicotine in them. If you are pregnant or breastfeeding, do not take any medicines to quit smoking unless your doctor says it is okay. Talk with your doctor about counseling or other things that can help you.  Talk with your doctor about using more than one strategy at the same time, such as taking medicines while you are also going to in-person counseling. This can help make quitting easier. What things can I do to make it easier to quit? Quitting smoking might feel very hard at first, but there is a lot that you can do to make it easier. Take these steps:  Talk to your family and friends. Ask them to support and encourage you.  Call phone quitlines, reach out to support groups, or work with a Veterinary surgeoncounselor.  Ask people who smoke to not smoke around you.  Avoid places that make you want (trigger) to smoke, such as: ? Bars. ? Parties. ? Smoke-break areas at work.  Spend time with people who do not smoke.  Lower the stress in your life. Stress can make you want to smoke. Try these things to help your stress: ? Getting regular exercise. ? Deep-breathing exercises. ? Yoga. ? Meditating. ? Doing a body scan. To do this, close your eyes, focus on one area of your body at a time from head to toe, and notice which parts of your body are tense. Try to relax the muscles in those areas.  Download or buy apps on your mobile phone or tablet that can help you stick to your quit plan. There are many free apps, such as QuitGuide from the [employer] developer for Disease Control and Prevention). You can find more support from smokefree.gov and other websites.  This information is not intended to replace advice given to you by your health care provider. Make sure you discuss any questions you have with your health care provider. Document  Released: 10/17/2008 Document Revised: 08/19/2015 Document Reviewed: 05/07/2014 Elsevier Interactive Patient Education  2018 [employer]. Montelukast oral tablets What is this medicine? MONTELUKAST (mon te LOO kast) is used to prevent and treat the symptoms of asthma. It is also used to treat allergies. Do not use for an acute asthma attack. This medicine may be used for other purposes; ask your health care provider or pharmacist if you have questions. COMMON BRAND NAME(S): Singulair What should I tell my health care provider before I take this medicine? They need to know if you have any of these conditions: -liver disease -an unusual or allergic reaction to montelukast, other medicines, foods, dyes, or preservatives -pregnant or trying to get pregnant -breast-feeding How should I use this medicine? This medicine should be given by mouth. Follow the directions on the prescription label. Take this medicine at the same time every day. You may take this medicine with or without meals. Do not chew the tablets. Do not stop taking your medicine unless your doctor tells you to. Talk to your pediatrician regarding the use of this medicine in children. Special care may be needed. While this drug may be prescribed for children as young as 81 years of age for selected conditions, precautions do apply. Overdosage: If you think you have taken too much of this medicine contact a poison control center or emergency room at once. NOTE: This medicine is only for you. Do not share this medicine with others. What if I miss a dose? If you miss a dose, take it as soon as you can. If it is almost time for your next dose, take only that dose. Do not take double or extra doses. What may interact with this medicine? -anti-infectives like rifampin and rifabutin -medicines for diabetes like rosiglitazone and repaglinide -medicines for seizures like phenytoin, phenobarbital, and carbamazepine -paclitaxel This list may  not describe all possible interactions. Give your health care provider a list of all the medicines, herbs, non-prescription drugs, or dietary supplements you use. Also tell them if you smoke, drink alcohol, or use illegal drugs. Some items may interact with your medicine. What should I watch for while using this medicine? Visit your doctor or health care professional for regular checks on your progress. Tell your doctor or health care professional if your allergy or asthma symptoms do not improve. Take your medicine even when you do not have symptoms. Do not stop taking any of your  medicine(s) unless your doctor tells you to. If you have asthma, talk to your doctor about what to do in an acute asthma attack. Always have your inhaled rescue medicine for asthma attacks with you. Patients and their families should watch for new or worsening thoughts of suicide or depression. Also watch for sudden changes in feelings such as feeling anxious, agitated, panicky, irritable, hostile, aggressive, impulsive, severely restless, overly excited and hyperactive, or not being able to sleep. Any worsening of mood or thoughts of suicide or dying should be reported to your health care professional right away. What side effects may I notice from receiving this medicine? Side effects that you should report to your doctor or health care professional as soon as possible: -allergic reactions like skin rash or hives, or swelling of the face, lips, or tongue -breathing problems -confusion -dark urine -fever or infection -flu-like symptoms -hallucinations -painful lumps under the skin -pain, tingling, numbness in the hands or feet -sinus pain or swelling -suicidal thoughts or other mood changes -tremors -trouble sleeping -uncontrolled muscle movements -unusual bleeding or bruising -yellowing of the eyes or skin Side effects that usually do not require medical attention (report to your doctor or health care professional  if they continue or are bothersome): -cough -dizziness -drowsiness -headache -nightmares -stomach upset -stuffy nose This list may not describe all possible side effects. Call your doctor for medical advice about side effects. You may report side effects to FDA at 1-800-FDA-1088. Where should I keep my medicine? Keep out of the reach of children. Store at room temperature between 15 and 30 degrees C (59 and 86 degrees F). Protect from light and moisture. Keep this medicine in the original bottle. Throw away any unused medicine after the expiration date. NOTE: This sheet is a summary. It may not cover all possible information. If you have questions about this medicine, talk to your doctor, pharmacist, or health care provider.  2018 Elsevier/Gold Standard (2014-12-23 09:40:44)

## 2017-12-07 NOTE — Progress Notes (Signed)
Refills on albuterol and symbicort "I can't breath"  SOB,DOE  Request paper copy for prescriptions

## 2017-12-08 LAB — CBC
HEMATOCRIT: 45.8 % (ref 37.5–51.0)
Hemoglobin: 15.6 g/dL (ref 13.0–17.7)
MCH: 31.1 pg (ref 26.6–33.0)
MCHC: 34.1 g/dL (ref 31.5–35.7)
MCV: 91 fL (ref 79–97)
PLATELETS: 278 10*3/uL (ref 150–450)
RBC: 5.01 x10E6/uL (ref 4.14–5.80)
RDW: 12.7 % (ref 12.3–15.4)
WBC: 12.1 10*3/uL — AB (ref 3.4–10.8)

## 2017-12-08 LAB — CMP14+EGFR
A/G RATIO: 1.6 (ref 1.2–2.2)
ALT: 13 IU/L (ref 0–44)
AST: 23 IU/L (ref 0–40)
Albumin: 4.6 g/dL (ref 3.5–5.5)
Alkaline Phosphatase: 91 IU/L (ref 39–117)
BUN/Creatinine Ratio: 15 (ref 9–20)
BUN: 17 mg/dL (ref 6–24)
Bilirubin Total: 0.3 mg/dL (ref 0.0–1.2)
CALCIUM: 9.8 mg/dL (ref 8.7–10.2)
CO2: 25 mmol/L (ref 20–29)
CREATININE: 1.17 mg/dL (ref 0.76–1.27)
Chloride: 97 mmol/L (ref 96–106)
GFR, EST AFRICAN AMERICAN: 84 mL/min/{1.73_m2} (ref >59)
GFR, EST NON AFRICAN AMERICAN: 73 mL/min/{1.73_m2} (ref >59)
Globulin, Total: 2.9 g/dL (ref 1.5–4.5)
Glucose: 104 mg/dL — ABNORMAL HIGH (ref 65–99)
POTASSIUM: 4.9 mmol/L (ref 3.5–5.2)
Sodium: 139 mmol/L (ref 134–144)
TOTAL PROTEIN: 7.5 g/dL (ref 6.0–8.5)

## 2017-12-11 ENCOUNTER — Encounter: Payer: Self-pay | Admitting: Nurse Practitioner

## 2018-01-25 ENCOUNTER — Ambulatory Visit: Payer: Self-pay | Attending: Family Medicine | Admitting: Physician Assistant

## 2018-01-25 VITALS — BP 121/84 | HR 80 | Temp 98.1°F | Resp 16 | Wt 181.8 lb

## 2018-01-25 DIAGNOSIS — R05 Cough: Secondary | ICD-10-CM

## 2018-01-25 DIAGNOSIS — F17200 Nicotine dependence, unspecified, uncomplicated: Secondary | ICD-10-CM

## 2018-01-25 DIAGNOSIS — R059 Cough, unspecified: Secondary | ICD-10-CM

## 2018-01-25 DIAGNOSIS — J4540 Moderate persistent asthma, uncomplicated: Secondary | ICD-10-CM

## 2018-01-25 DIAGNOSIS — J441 Chronic obstructive pulmonary disease with (acute) exacerbation: Secondary | ICD-10-CM

## 2018-01-25 MED ORDER — ALBUTEROL SULFATE HFA 108 (90 BASE) MCG/ACT IN AERS: 1.0000 | INHALATION_SPRAY | Freq: Four times a day (QID) | RESPIRATORY_TRACT | 6 refills | Status: DC | PRN

## 2018-01-25 MED ORDER — ALBUTEROL SULFATE HFA 108 (90 BASE) MCG/ACT IN AERS: 1.0000 | INHALATION_SPRAY | Freq: Four times a day (QID) | RESPIRATORY_TRACT | 1 refills | Status: DC | PRN

## 2018-01-25 MED ORDER — BUDESONIDE-FORMOTEROL FUMARATE 160-4.5 MCG/ACT IN AERO
2.0000 | INHALATION_SPRAY | Freq: Two times a day (BID) | RESPIRATORY_TRACT | 6 refills | Status: DC
Start: 2018-01-25 — End: 2018-12-07

## 2018-01-25 MED FILL — SYMBICORT 160-4.5 MCG INH: 160-4.5 | 30 days supply | Qty: 10 | Fill #0

## 2018-01-25 MED FILL — !VENTOLIN HFA INHALER: 108 (90 BAS | 25 days supply | Qty: 18 | Fill #0

## 2018-01-25 NOTE — Progress Notes (Signed)
Patient ID: Richard RuddyJon Fuentes, male   DOB: May 30, 1970, 48 y.o.   MRN: [id number]   Richard Fuentes, is a 48 y.o. male  [id number]  HQI:[id number]  DOB - May 30, 1970  Subjective:  Chief Complaint and HPI: Richard Fuentes is a 48 y.o. male here today for RF on symbicort.  He has been out of it for over 2 weeks and is coughing more and having more SOB.  Denies CP.  Having to use rescue inhaler more frequently.  He feels his cough has increased and is interested in getting a chest xray.  He has 35 pack year h/o smoking.  He quit smoking about 2.5 weeks ago.  No fever/chills.  No hemoptysis.     ROS:   Constitutional:  No f/c, No night sweats, No unexplained weight loss. EENT:  No vision changes, No blurry vision, No hearing changes. No mouth, throat, or ear problems.  Respiratory: No cough, No SOB Cardiac: No CP, no palpitations GI:  No abd pain, No N/V/D. GU: No Urinary s/sx Musculoskeletal: No joint pain Neuro: No headache, no dizziness, no motor weakness.  Skin: No rash Endocrine:  No polydipsia. No polyuria.  Psych: Denies SI/HI  No problems updated.  ALLERGIES: Allergies  Allergen Reactions  . Penicillins Other (See Comments)    Childhood allergy  Has patient had a PCN reaction causing immediate rash, facial/tongue/throat swelling, SOB or lightheadedness with hypotension: n/a Has patient had a PCN reaction causing severe rash involving mucus membranes or skin necrosis: n/a Has patient had a PCN reaction that required hospitalization: n/a Has patient had a PCN reaction occurring within the last 10 years: n/a If all of the above answers are "NO", then may proceed with Cephalosporin use.     PAST MEDICAL HISTORY: Past Medical History:  Diagnosis Date  . Anxiety   . Asthma   . COPD (chronic obstructive pulmonary disease) (HCC)   . Environmental allergies   . Esophagitis   . Hx of migraines   . OCD (obsessive compulsive disorder)     MEDICATIONS AT HOME: Prior to Admission  medications   Medication Sig Start Date End Date Taking? Authorizing Provider  albuterol (PROVENTIL HFA;VENTOLIN HFA) 108 (90 Base) MCG/ACT inhaler Inhale 1-2 puffs into the lungs every 6 (six) hours as needed for up to 30 days for wheezing or shortness of breath (COUGH). 01/25/18 02/24/18  Anders SimmondsMcClung, Angela M, PA-C  budesonide-formoterol (SYMBICORT) 160-4.5 MCG/ACT inhaler Inhale 2 puffs into the lungs 2 (two) times daily for 30 days. 01/25/18 02/24/18  Anders SimmondsMcClung, Angela M, PA-C  montelukast (SINGULAIR) 10 MG tablet Take 1 tablet (10 mg total) by mouth at bedtime. 12/07/17   Claiborne RiggFleming, Zelda W, NP  clonazePAM (KLONOPIN) 0.5 MG tablet Take 1 tablet (0.5 mg total) by mouth 2 (two) times daily as needed for anxiety. Patient not taking: Reported on 05/25/2015 08/13/12 06/13/15  Moreno-Coll, Adlih, MD     Objective:  EXAM:   Vitals:   01/25/18 1358  BP: 121/84  Pulse: 80  Resp: 16  Temp: 98.1 F (36.7 C)  TempSrc: Oral  SpO2: 99%  Weight: 181 lb 12.8 oz (82.5 kg)    General appearance : A&OX3. NAD. Non-toxic-appearing HEENT: Atraumatic and Normocephalic.  PERRLA. EOM intact.   Neck: supple, no JVD. No cervical lymphadenopathy. No thyromegaly Chest/Lungs:  Breathing-non-labored, Good air entry bilaterally, breath sounds normal without rales, rhonchi, or wheezing  CVS: S1 S2 regular, no murmurs, gallops, rubs  Extremities: Bilateral Lower Ext shows no edema, both legs are warm to  touch with = pulse throughout Neurology:  CN II-XII grossly intact, Non focal.   Psych:  TP linear. J/I WNL. Normal speech. Appropriate eye contact and affect.  Skin:  No Rash  Data Review No results found for: HGBA1C   Assessment & Plan   1. COPD  resume - budesonide-formoterol (SYMBICORT) 160-4.5 MCG/ACT inhaler; Inhale 2 puffs into the lungs 2 (two) times daily for 30 days.  Dispense: 1 Inhaler; Refill: 6 - albuterol (PROVENTIL HFA;VENTOLIN HFA) 108 (90 Base) MCG/ACT inhaler; Inhale 1-2 puffs into the lungs every 6  (six) hours as needed for up to 30 days for wheezing or shortness of breath (COUGH).  Dispense: 1 Inhaler; Refill: 1  2. Moderate persistent reactive airway disease without complication resume - DG Chest 2 View; Future - budesonide-formoterol (SYMBICORT) 160-4.5 MCG/ACT inhaler; Inhale 2 puffs into the lungs 2 (two) times daily for 30 days.  Dispense: 1 Inhaler; Refill: 6  3. Tobacco dependence Has stopped-encouraged and congratulated for progress! - DG Chest 2 View; Future  4. Cough Should improve with getting back on symbicort.  Recheck sooner if not or worsens.   - DG Chest 2 View; Future   Patient have been counseled extensively about nutrition and exercise  Return in about 1 month (around 02/25/2018) for Zelda-recheck breathing/cough.  The patient was given clear instructions to go to ER or return to medical center if symptoms don't improve, worsen or new problems develop. The patient verbalized understanding. The patient was told to call to get lab results if they haven't heard anything in the next week.     Georgian Co, PA-C [facility] [place], [place] [phone]   01/25/2018, 2:15 PM

## 2018-03-10 MED FILL — $VENTOLIN HFA 18G INHALER: 108 (90 BAS | 75 days supply | Qty: 54 | Fill #1

## 2018-03-10 MED FILL — SYMBICORT 160-4.5 MCG INH: 160-4.5 | 30 days supply | Qty: 10 | Fill #1

## 2018-03-22 MED FILL — SYMBICORT 160-4.5 MCG INH: 160-4.5 | 30 days supply | Qty: 10 | Fill #2

## 2018-06-12 MED FILL — !SYMBICORT 160-4.5 MCG INH: 160-4.5 | 30 days supply | Qty: 1 | Fill #3

## 2018-06-12 MED FILL — $VENTOLIN HFA 18G INHALER: 108 (90 BAS | 25 days supply | Qty: 18 | Fill #2

## 2018-07-27 ENCOUNTER — Telehealth: Payer: Self-pay | Admitting: Nurse Practitioner

## 2018-07-27 MED FILL — $Symbicort 160-4.5mcg/act: 160-4.5 | 30 days supply | Qty: 1 | Fill #4

## 2018-07-27 MED FILL — $VENTOLIN HFA 18G INHALER: 108 (90 BAS | 25 days supply | Qty: 18 | Fill #3

## 2018-07-27 NOTE — Telephone Encounter (Signed)
Patient called requesting change rx because her symbicort and Albuterol is not working Please, call him back .

## 2018-07-27 NOTE — Telephone Encounter (Signed)
CMA called patient and asked if he is taking is singular as well at bedtime. Pt. Stated he hasn't take it and will resume taking it.  Patient stated when he eat simple meal like cereal or a sandwich he can't breathe well.  CMA did inform patient if he have difficulty breathing he needs to go ED or Urgent Care to get evaluated.

## 2018-07-30 NOTE — Telephone Encounter (Signed)
Agree with instructions given

## 2018-09-04 MED FILL — $VENTOLIN HFA 18G INHALER: 108 (90 BAS | 25 days supply | Qty: 18 | Fill #4

## 2018-09-04 MED FILL — $Symbicort 160-4.5mcg/act: 160-4.5 | 30 days supply | Qty: 1 | Fill #5

## 2018-10-02 ENCOUNTER — Telehealth: Payer: Self-pay | Admitting: Physician Assistant

## 2018-10-02 ENCOUNTER — Telehealth: Payer: Self-pay

## 2018-10-02 DIAGNOSIS — R059 Cough, unspecified: Secondary | ICD-10-CM

## 2018-10-02 DIAGNOSIS — R05 Cough: Secondary | ICD-10-CM

## 2018-10-02 DIAGNOSIS — Z201 Contact with and (suspected) exposure to tuberculosis: Secondary | ICD-10-CM

## 2018-10-02 NOTE — Progress Notes (Signed)
Hi Culley, Thank you for the details you included in the comment boxes. Those details are very helpful in determining the best course of treatment for you and help [place] to provide the best care.  E-visits are designed to treat acute, minor illnesses.   For your situation, if your clinic advised you to schedule a virtual visit, I recommend you have a virtual visit with your PCP.  Or, see below for information about video visits.      Based on what you shared with me, I feel your condition warrants further evaluation and I recommend that you be seen for a face to face visit.  Please contact your primary care physician practice to be seen. Many offices offer virtual options to be seen via video if you are not comfortable going in person to a medical facility at this time.  If you do not have a PCP, [facility] offers a free physician referral service available at [phone]. Our trained staff has the experience, knowledge and resources to put you in touch with a physician who is right for you.   You also have the option of a video visit through [email or web address]  If you are having a true medical emergency please call 911.  NOTE: If you entered your credit card information for this eVisit, you will not be charged. You may see a "hold" on your card for the $35 but that hold will drop off and you will not have a charge processed.  Your e-visit answers were reviewed by a board certified advanced clinical practitioner to complete your personal care plan.  Thank you for using e-Visits.

## 2018-10-17 MED FILL — $VENTOLIN HFA 18G INHALER: 108 (90 BAS | 25 days supply | Qty: 18 | Fill #1

## 2018-10-17 MED FILL — $Symbicort 160-4.5mcg/act: 160-4.5 | 30 days supply | Qty: 1 | Fill #6

## 2018-11-28 ENCOUNTER — Other Ambulatory Visit: Payer: Self-pay | Admitting: Nurse Practitioner

## 2018-11-28 DIAGNOSIS — J441 Chronic obstructive pulmonary disease with (acute) exacerbation: Secondary | ICD-10-CM

## 2018-11-28 MED FILL — MONTELUKAST SOD 10 MG TAB: 10 | 30 days supply | Qty: 30 | Fill #1

## 2018-11-28 MED FILL — SYMBICORT 160-4.5 MCG INH: 160-4.5 | 25 days supply | Qty: 10 | Fill #1

## 2018-12-07 ENCOUNTER — Other Ambulatory Visit: Payer: Self-pay

## 2018-12-07 ENCOUNTER — Ambulatory Visit: Payer: Self-pay | Attending: Nurse Practitioner | Admitting: Physician Assistant

## 2018-12-07 VITALS — BP 110/73 | HR 85 | Temp 98.0°F | Ht 73.0 in | Wt 188.0 lb

## 2018-12-07 DIAGNOSIS — J4540 Moderate persistent asthma, uncomplicated: Secondary | ICD-10-CM

## 2018-12-07 DIAGNOSIS — J441 Chronic obstructive pulmonary disease with (acute) exacerbation: Secondary | ICD-10-CM

## 2018-12-07 DIAGNOSIS — Z131 Encounter for screening for diabetes mellitus: Secondary | ICD-10-CM

## 2018-12-07 DIAGNOSIS — Z87891 Personal history of nicotine dependence: Secondary | ICD-10-CM

## 2018-12-07 DIAGNOSIS — Z125 Encounter for screening for malignant neoplasm of prostate: Secondary | ICD-10-CM

## 2018-12-07 DIAGNOSIS — Z13220 Encounter for screening for lipoid disorders: Secondary | ICD-10-CM

## 2018-12-07 MED ORDER — MONTELUKAST SODIUM 10 MG PO TABS: 10.0000 mg | ORAL_TABLET | Freq: Every day | ORAL | 3 refills | Status: DC

## 2018-12-07 MED ORDER — BUDESONIDE-FORMOTEROL FUMARATE 160-4.5 MCG/ACT IN AERO: 2.0000 | INHALATION_SPRAY | Freq: Two times a day (BID) | RESPIRATORY_TRACT | 6 refills | Status: DC

## 2018-12-07 MED ORDER — PREDNISONE 10 MG PO TABS: ORAL_TABLET | ORAL | 0 refills | Status: DC

## 2018-12-07 MED ORDER — BUDESONIDE-FORMOTEROL FUMARATE 160-4.5 MCG/ACT IN AERO
2.0000 | INHALATION_SPRAY | Freq: Two times a day (BID) | RESPIRATORY_TRACT | 6 refills | Status: DC
Start: 2018-12-07 — End: 2018-12-07

## 2018-12-07 MED ORDER — ALBUTEROL SULFATE HFA 108 (90 BASE) MCG/ACT IN AERS: 1.0000 | INHALATION_SPRAY | Freq: Four times a day (QID) | RESPIRATORY_TRACT | 1 refills | Status: DC | PRN

## 2018-12-07 MED ORDER — PREDNISONE 10 MG PO TABS
ORAL_TABLET | ORAL | 0 refills | Status: DC
Start: 2018-12-07 — End: 2018-12-07

## 2018-12-07 MED FILL — $VENTOLIN HFA 18G INHALER: 108 (90 BAS | 25 days supply | Qty: 18 | Fill #0

## 2018-12-07 MED FILL — predniSONE 10 MG TABS: 10 | 6 days supply | Qty: 21 | Fill #0

## 2018-12-07 NOTE — Progress Notes (Signed)
Patient ID: Richard Fuentes, male   DOB: 02-11-1970, 48 y.o.   MRN: [id number]   Richard Fuentes, is a 48 y.o. male  WUJ:[id number]  [id number]  DOB - Feb 17, 1970  Subjective:  Chief Complaint and HPI: Richard Fuentes is a 48 y.o. male here today for med RF and uncontrolled wheezing. He takes his symbicort on and off.  Not taking Singulair and has been out of albuterol.  He admits to partial compliance with meds bc of his OCD and that he really doesn't want meds.  He just wants his breathing fixed.  He has tolerated prednisone in the past.  He denies fever/chills.  He does not have insurance and hasn't applied for orange card latelty.  He did quit smoking in January.    ROS:   Constitutional:  No f/c, No night sweats, No unexplained weight loss. EENT:  No vision changes, No blurry vision, No hearing changes. No mouth, throat, or ear problems.  Respiratory: No cough, +wheezing Cardiac: No CP, no palpitations GI:  No abd pain, No N/V/D. GU: No Urinary s/sx Musculoskeletal: No joint pain Neuro: No headache, no dizziness, no motor weakness.  Skin: No rash Endocrine:  No polydipsia. No polyuria.  Psych: Denies SI/HI  No problems updated.  ALLERGIES: Allergies  Allergen Reactions  . Penicillins Other (See Comments)    Childhood allergy  Has patient had a PCN reaction causing immediate rash, facial/tongue/throat swelling, SOB or lightheadedness with hypotension: n/a Has patient had a PCN reaction causing severe rash involving mucus membranes or skin necrosis: n/a Has patient had a PCN reaction that required hospitalization: n/a Has patient had a PCN reaction occurring within the last 10 years: n/a If all of the above answers are "NO", then may proceed with Cephalosporin use.     PAST MEDICAL HISTORY: Past Medical History:  Diagnosis Date  . Anxiety   . Asthma   . COPD (chronic obstructive pulmonary disease) ([place])   . Environmental allergies   . Esophagitis   . Hx of migraines   .  OCD (obsessive compulsive disorder)     MEDICATIONS AT HOME: Prior to Admission medications   Medication Sig Start Date End Date Taking? Authorizing Provider  albuterol (VENTOLIN HFA) 108 (90 Base) MCG/ACT inhaler Inhale 1-2 puffs into the lungs every 6 (six) hours as needed for wheezing or shortness of breath (COUGH). 12/07/18 01/06/19  Argentina Donovan, PA-C  budesonide-formoterol (SYMBICORT) 160-4.5 MCG/ACT inhaler Inhale 2 puffs into the lungs 2 (two) times daily. 12/07/18 01/06/19  Argentina Donovan, PA-C  montelukast (SINGULAIR) 10 MG tablet Take 1 tablet (10 mg total) by mouth at bedtime. 12/07/18   Argentina Donovan, PA-C  predniSONE (DELTASONE) 10 MG tablet 6,5,4,3,2,1 take each days dose at once with breakfast 12/07/18   Freeman Caldron M, PA-C  clonazePAM (KLONOPIN) 0.5 MG tablet Take 1 tablet (0.5 mg total) by mouth 2 (two) times daily as needed for anxiety. Patient not taking: Reported on 05/25/2015 08/13/12 06/13/15  Moreno-Coll, Adlih, MD     Objective:  EXAM:   Vitals:   12/07/18 1025  BP: 110/73  Pulse: 85  Temp: 98 F (36.7 C)  TempSrc: Oral  SpO2: 97%  Weight: 188 lb (85.3 kg)  Height: 6\' 1"  (1.854 m)    General appearance : A&OX3. NAD. Non-toxic-appearing HEENT: Atraumatic and Normocephalic.  PERRLA. EOM intact.  Neck: supple, no JVD. No cervical lymphadenopathy. No thyromegaly Chest/Lungs:  Breathing-non-labored, Good air entry bilaterally, breath sounds normal without rales, rhonchi, or wheezing  CVS: S1 S2 regular, no murmurs, gallops, rubs  Neurology:  CN II-XII grossly intact, Non focal.   Psych:  TP linear but gets stuck on one subject and question.  Makes repeated same statements and same questions worded slightly different.  J/I fair. Normal speech. Appropriate eye contact and anxious affect.  Skin:  No Rash  Data Review No results found for: HGBA1C   Assessment & Plan   1. COPD exacerbation (HCC) - Comprehensive metabolic panel - CBC with Differential  - albuterol (VENTOLIN HFA) 108 (90 Base) MCG/ACT inhaler; Inhale 1-2 puffs into the lungs every 6 (six) hours as needed for wheezing or shortness of breath (COUGH).  Dispense: 18 g; Refill: 1 - budesonide-formoterol (SYMBICORT) 160-4.5 MCG/ACT inhaler; Inhale 2 puffs into the lungs 2 (two) times daily.  Dispense: 1 Inhaler; Refill: 6 - montelukast (SINGULAIR) 10 MG tablet; Take 1 tablet (10 mg total) by mouth at bedtime.  Dispense: 30 tablet; Refill: 3 - Ambulatory referral to Pulmonology-encouraged patient to proceed with OC application  2. Moderate persistent reactive airway disease without complication - budesonide-formoterol (SYMBICORT) 160-4.5 MCG/ACT inhaler; Inhale 2 puffs into the lungs 2 (two) times daily.  Dispense: 1 Inhaler; Refill: 6 - Ambulatory referral to Pulmonology  3. Screening cholesterol level - Lipid panel  4. Screening for prostate cancer - PSA  5. Screening for diabetes mellitus - Hemoglobin A1c  6. Former smoker [employer] job on smoking cessation-keep up the good work.   - Ambulatory referral to Pulmonology   Patient have been counseled extensively about nutrition and exercise  Return in about 6 months (around 06/07/2019) for with PCP;  sooner if needed.  The patient was given clear instructions to go to ER or return to medical center if symptoms don't improve, worsen or new problems develop. The patient verbalized understanding. The patient was told to call to get lab results if they haven't heard anything in the next week.     Richard Co, PA-C [facility] and [facility] [place], [place] [phone]   12/07/2018, 10:45 AM

## 2018-12-07 NOTE — Progress Notes (Signed)
Patient needs refills on inhalers.  Patient states that inhalers does not work.

## 2018-12-08 ENCOUNTER — Encounter: Payer: Self-pay | Admitting: Physician Assistant

## 2018-12-08 LAB — CBC WITH DIFFERENTIAL/PLATELET
Basophils Absolute: 0.1 10*3/uL (ref 0.0–0.2)
Basos: 1 %
EOS (ABSOLUTE): 0.2 10*3/uL (ref 0.0–0.4)
Eos: 2 %
Hematocrit: 43.9 % (ref 37.5–51.0)
Hemoglobin: 15.1 g/dL (ref 13.0–17.7)
Immature Grans (Abs): 0.1 10*3/uL (ref 0.0–0.1)
Immature Granulocytes: 1 %
Lymphocytes Absolute: 2.4 10*3/uL (ref 0.7–3.1)
Lymphs: 23 %
MCH: 31.5 pg (ref 26.6–33.0)
MCHC: 34.4 g/dL (ref 31.5–35.7)
MCV: 92 fL (ref 79–97)
Monocytes Absolute: 0.8 10*3/uL (ref 0.1–0.9)
Monocytes: 8 %
Neutrophils Absolute: 6.9 10*3/uL (ref 1.4–7.0)
Neutrophils: 65 %
Platelets: 288 10*3/uL (ref 150–450)
RBC: 4.8 x10E6/uL (ref 4.14–5.80)
RDW: 12 % (ref 11.6–15.4)
WBC: 10.4 10*3/uL (ref 3.4–10.8)

## 2018-12-08 LAB — LIPID PANEL
Chol/HDL Ratio: 4.8 ratio (ref 0.0–5.0)
Cholesterol, Total: 240 mg/dL — ABNORMAL HIGH (ref 100–199)
HDL: 50 mg/dL (ref >39)
LDL Chol Calc (NIH): 165 mg/dL — ABNORMAL HIGH (ref 0–99)
Triglycerides: 139 mg/dL (ref 0–149)
VLDL Cholesterol Cal: 25 mg/dL (ref 5–40)

## 2018-12-08 LAB — COMPREHENSIVE METABOLIC PANEL
ALT: 13 IU/L (ref 0–44)
AST: 26 IU/L (ref 0–40)
Albumin/Globulin Ratio: 1.4 (ref 1.2–2.2)
Albumin: 4.3 g/dL (ref 4.0–5.0)
Alkaline Phosphatase: 90 IU/L (ref 39–117)
BUN/Creatinine Ratio: 13 (ref 9–20)
BUN: 15 mg/dL (ref 6–24)
Bilirubin Total: 0.3 mg/dL (ref 0.0–1.2)
CO2: 26 mmol/L (ref 20–29)
Calcium: 9.8 mg/dL (ref 8.7–10.2)
Chloride: 101 mmol/L (ref 96–106)
Creatinine, Ser: 1.12 mg/dL (ref 0.76–1.27)
GFR calc Af Amer: 88 mL/min/{1.73_m2} (ref >59)
GFR calc non Af Amer: 76 mL/min/{1.73_m2} (ref >59)
Globulin, Total: 3 g/dL (ref 1.5–4.5)
Glucose: 104 mg/dL — ABNORMAL HIGH (ref 65–99)
Potassium: 4.4 mmol/L (ref 3.5–5.2)
Sodium: 140 mmol/L (ref 134–144)
Total Protein: 7.3 g/dL (ref 6.0–8.5)

## 2018-12-08 LAB — HEMOGLOBIN A1C
Est. average glucose Bld gHb Est-mCnc: 120 mg/dL
Hgb A1c MFr Bld: 5.8 % — ABNORMAL HIGH (ref 4.8–5.6)

## 2018-12-08 LAB — PSA: Prostate Specific Ag, Serum: 1.3 ng/mL (ref 0.0–4.0)

## 2019-01-02 MED FILL — $VENTOLIN HFA 18G INHALER: 108 (90 BAS | 25 days supply | Qty: 18 | Fill #1

## 2019-01-02 MED FILL — SYMBICORT 160-4.5 MCG INH: 160-4.5 | 1 days supply | Qty: 10 | Fill #0

## 2019-01-29 ENCOUNTER — Telehealth: Payer: Self-pay | Admitting: Nurse Practitioner

## 2019-01-29 NOTE — Telephone Encounter (Signed)
Patient called again on X-Ray please contact him at [phone]

## 2019-01-29 NOTE — Telephone Encounter (Signed)
Patient calling requesting X-Ray please contact patient at [phone].

## 2019-01-30 ENCOUNTER — Telehealth: Payer: Self-pay | Admitting: Nurse Practitioner

## 2019-01-30 NOTE — Telephone Encounter (Signed)
Attempt to reach patient regarding his concerns. No answer and unable to leave a VM due to mailbox has not been set up.

## 2019-01-30 NOTE — Telephone Encounter (Signed)
Patient stated that he was requesting a chest Xray. Please fu at your earliest convenience.

## 2019-01-30 NOTE — Telephone Encounter (Signed)
CMA spoke to patient. Pt. Tested positive for COVID19 on Saturday and would like to get a chest xray for his difficulty breathing.

## 2019-01-31 ENCOUNTER — Other Ambulatory Visit: Payer: Self-pay | Admitting: Physician Assistant

## 2019-01-31 DIAGNOSIS — J441 Chronic obstructive pulmonary disease with (acute) exacerbation: Secondary | ICD-10-CM

## 2019-01-31 MED FILL — SYMBICORT 160-4.5 MCG INH: 160-4.5 | 30 days supply | Qty: 10 | Fill #1

## 2019-01-31 NOTE — Telephone Encounter (Signed)
CMA spoke to patient and informed on PCP advising. Pt. Understood, pt stated he is feeling much better.  Pt. Is requesting if PCP can refill his albuterol and Symbicort inhalers to [facility] pharmacy.

## 2019-01-31 NOTE — Telephone Encounter (Signed)
Patient tested positive for COVID on Saturday. Please advise if refill is appropriate at this time.

## 2019-01-31 NOTE — Telephone Encounter (Signed)
He needs to go to the emergency room if he is having trouble breathing. They will perform an xray

## 2019-02-01 ENCOUNTER — Other Ambulatory Visit: Payer: Self-pay | Admitting: Nurse Practitioner

## 2019-02-01 DIAGNOSIS — J4540 Moderate persistent asthma, uncomplicated: Secondary | ICD-10-CM

## 2019-02-01 DIAGNOSIS — J441 Chronic obstructive pulmonary disease with (acute) exacerbation: Secondary | ICD-10-CM

## 2019-02-01 MED ORDER — ALBUTEROL SULFATE HFA 108 (90 BASE) MCG/ACT IN AERS: 1.0000 | INHALATION_SPRAY | Freq: Four times a day (QID) | RESPIRATORY_TRACT | 1 refills | Status: DC | PRN

## 2019-02-01 MED ORDER — BUDESONIDE-FORMOTEROL FUMARATE 160-4.5 MCG/ACT IN AERO: 2.0000 | INHALATION_SPRAY | Freq: Two times a day (BID) | RESPIRATORY_TRACT | 6 refills | Status: DC

## 2019-02-05 MED FILL — ALBUTEROL SULFATE HFA 108 (: 108 (90 BAS | 25 days supply | Qty: 18 | Fill #0

## 2019-02-15 NOTE — Telephone Encounter (Signed)
Spoke to patient. Pt. Stated he cannot breathe and having shortness of breath just walking back to his car.   CMA informed him to go to the emergency department for his shortness of breath and difficulty breathing.  Pt. Understood.

## 2019-02-24 ENCOUNTER — Emergency Department (HOSPITAL_COMMUNITY)
Admission: EM | Admit: 2019-02-24 | Discharge: 2019-02-24 | Disposition: A | Discharge status: Home / Self Care | Payer: Medicaid Other | Attending: Emergency Medicine | Admitting: Emergency Medicine

## 2019-02-24 ENCOUNTER — Emergency Department (HOSPITAL_COMMUNITY): Payer: Medicaid Other

## 2019-02-24 ENCOUNTER — Other Ambulatory Visit: Payer: Self-pay

## 2019-02-24 DIAGNOSIS — J449 Chronic obstructive pulmonary disease, unspecified: Secondary | ICD-10-CM | POA: Insufficient documentation

## 2019-02-24 DIAGNOSIS — F17210 Nicotine dependence, cigarettes, uncomplicated: Secondary | ICD-10-CM | POA: Diagnosis not present

## 2019-02-24 DIAGNOSIS — R0602 Shortness of breath: Secondary | ICD-10-CM | POA: Diagnosis not present

## 2019-02-24 LAB — CBC WITH DIFFERENTIAL/PLATELET
Abs Immature Granulocytes: 0.04 10*3/uL (ref 0.00–0.07)
Basophils Absolute: 0.1 10*3/uL (ref 0.0–0.1)
Basophils Relative: 1 %
Eosinophils Absolute: 0.1 10*3/uL (ref 0.0–0.5)
Eosinophils Relative: 1 %
HCT: 46.8 % (ref 39.0–52.0)
Hemoglobin: 15.5 g/dL (ref 13.0–17.0)
Immature Granulocytes: 0 %
Lymphocytes Relative: 17 %
Lymphs Abs: 2.1 10*3/uL (ref 0.7–4.0)
MCH: 30.9 pg (ref 26.0–34.0)
MCHC: 33.1 g/dL (ref 30.0–36.0)
MCV: 93.2 fL (ref 80.0–100.0)
Monocytes Absolute: 0.8 10*3/uL (ref 0.1–1.0)
Monocytes Relative: 6 %
Neutro Abs: 9.6 10*3/uL — ABNORMAL HIGH (ref 1.7–7.7)
Neutrophils Relative %: 75 %
Platelets: 319 10*3/uL (ref 150–400)
RBC: 5.02 MIL/uL (ref 4.22–5.81)
RDW: 12 % (ref 11.5–15.5)
WBC: 12.8 10*3/uL — ABNORMAL HIGH (ref 4.0–10.5)
nRBC: 0 % (ref 0.0–0.2)

## 2019-02-24 LAB — BASIC METABOLIC PANEL
Anion gap: 8 (ref 5–15)
BUN: 19 mg/dL (ref 6–20)
CO2: 30 mmol/L (ref 22–32)
Calcium: 9.2 mg/dL (ref 8.9–10.3)
Chloride: 102 mmol/L (ref 98–111)
Creatinine, Ser: 1.26 mg/dL — ABNORMAL HIGH (ref 0.61–1.24)
GFR calc Af Amer: >60 mL/min (ref >60)
GFR calc non Af Amer: >60 mL/min (ref >60)
Glucose, Bld: 87 mg/dL (ref 70–99)
Potassium: 3.9 mmol/L (ref 3.5–5.1)
Sodium: 140 mmol/L (ref 135–145)

## 2019-02-24 MED ORDER — PREDNISONE 10 MG (21) PO TBPK: ORAL_TABLET | Freq: Every day | ORAL | 0 refills | Status: AC

## 2019-02-24 MED ORDER — DEXAMETHASONE SODIUM PHOSPHATE 10 MG/ML IJ SOLN
10.0000 mg | Freq: Once | INTRAMUSCULAR | Status: DC
  Filled 2019-02-24: qty 1

## 2019-02-24 MED ORDER — ALBUTEROL SULFATE HFA 108 (90 BASE) MCG/ACT IN AERS: 2.0000 | INHALATION_SPRAY | Freq: Four times a day (QID) | RESPIRATORY_TRACT | 1 refills | Status: DC | PRN

## 2019-02-24 MED ORDER — AEROCHAMBER PLUS FLO-VU MEDIUM MISC
1.0000 | Freq: Once | Status: AC
  Administered 2019-02-24: 1
  Filled 2019-02-24: qty 1

## 2019-02-24 MED ORDER — ALBUTEROL SULFATE HFA 108 (90 BASE) MCG/ACT IN AERS
4.0000 | INHALATION_SPRAY | Freq: Once | RESPIRATORY_TRACT | Status: AC
  Administered 2019-02-24: 16:00:00 4 via RESPIRATORY_TRACT
  Filled 2019-02-24: qty 6.7

## 2019-02-24 MED ORDER — ALBUTEROL SULFATE (2.5 MG/3ML) 0.083% IN NEBU: 2.5000 mg | INHALATION_SOLUTION | Freq: Four times a day (QID) | RESPIRATORY_TRACT | 1 refills | Status: DC | PRN

## 2019-02-24 MED ORDER — ALBUTEROL SULFATE (2.5 MG/3ML) 0.083% IN NEBU: 2.5000 mg | INHALATION_SOLUTION | Freq: Four times a day (QID) | RESPIRATORY_TRACT | 12 refills | Status: DC | PRN

## 2019-02-24 MED ORDER — PREDNISONE 10 MG (21) PO TBPK: ORAL_TABLET | Freq: Every day | ORAL | 0 refills | Status: DC

## 2019-02-24 NOTE — ED Notes (Signed)
Pr refusing decadron at this time stating his OCD as the reason. Pt explained that he has difficulty taking any medication in the hospital related to his OCD and would take hours to comfortably take the decadron. Pt also stated that if he were given a prescription for prednisone he would be able to take that at home.

## 2019-02-24 NOTE — ED Notes (Signed)
Pt wanted meds change to [facility]. Rayfield Citizen PA made aware

## 2019-02-24 NOTE — ED Provider Notes (Signed)
[facility]-EMERGENCY DEPT Provider Note   CSN: [id number] Arrival date & time: 02/24/19  1444     History Chief Complaint  Patient presents with  . Shortness of Breath    Richard Fuentes is a 49 y.o. male with a past medical history significant for anxiety, asthma, COPD, and OCD who presents to the ED due to gradual onset of worsening shortness of breath x2 months.  Patient states he tested positive for Covid on 01/27/2019.  He notes he had shortness of breath prior to Covid infection which has progressively worsened since.  Patient admits to an intermittent dry cough sometimes productive with yellow phlegm at night.  Denies sick contacts.  Shortness of breath is associated with intermittent wheezing.  He has tried albuterol, Symbicort, and breathing treatments with moderate relief.  Patient denies history of blood clots, recent surgeries, recent long immobilizations, and hormonal treatments.  Patient denies fever and chills.  Denies chest pain, abdominal pain, nausea, vomiting, diarrhea.  Patient does not currently have a pulmonologist.  He sees [facility] as his PCP as he is waiting for Medicaid.  Patient admits to quitting smoking January of last year.   HPI     Past Medical History:  Diagnosis Date  . Anxiety   . Asthma   . COPD (chronic obstructive pulmonary disease) (HCC)   . Environmental allergies   . Esophagitis   . Hx of migraines   . OCD (obsessive compulsive disorder)     Patient Active Problem List   Diagnosis Date Noted  . COPD exacerbation (HCC) 12/07/2017  . RAD (reactive airway disease) 12/07/2011  . Generalized anxiety disorder 12/07/2011  . Tobacco dependence 12/07/2011    Past Surgical History:  Procedure Laterality Date  . KNEE SURGERY         No family history on file.  Social History   Tobacco Use  . Smoking status: Current Every Day Smoker    Packs/day: 1.00    Types: Cigarettes  . Smokeless tobacco: Never Used    Substance Use Topics  . Alcohol use: No  . Drug use: No    Home Medications Prior to Admission medications   Medication Sig Start Date End Date Taking? Authorizing Provider  albuterol (VENTOLIN HFA) 108 (90 Base) MCG/ACT inhaler Inhale 1-2 puffs into the lungs every 6 (six) hours as needed for wheezing or shortness of breath (COUGH). 02/01/19 03/03/19 Yes Claiborne Rigg, NP  budesonide-formoterol (SYMBICORT) 160-4.5 MCG/ACT inhaler Inhale 2 puffs into the lungs 2 (two) times daily. 02/01/19 03/03/19 Yes Claiborne Rigg, NP  montelukast (SINGULAIR) 10 MG tablet Take 1 tablet (10 mg total) by mouth at bedtime. 12/07/18  Yes McClung, Angela M, PA-C  predniSONE (DELTASONE) 10 MG tablet 6,5,4,3,2,1 take each days dose at once with breakfast Patient not taking: Reported on 02/24/2019 12/07/18   Anders Simmonds, PA-C  predniSONE (STERAPRED UNI-PAK 21 TAB) 10 MG (21) TBPK tablet Take by mouth daily for 6 days. Take 6 tabs by mouth daily  for 1 days, then 5 tabs for 1 days, then 4 tabs for 1 days, then 3 tabs for 1 days, 2 tabs for 1 days, then 1 tab by mouth daily for 1 days 02/24/19 03/02/19  Mannie Stabile, PA-C  clonazePAM (KLONOPIN) 0.5 MG tablet Take 1 tablet (0.5 mg total) by mouth 2 (two) times daily as needed for anxiety. Patient not taking: Reported on 05/25/2015 08/13/12 06/13/15  Moreno-Coll, Christin Fudge, MD    Allergies  Penicillins  Review of Systems   Review of Systems  Constitutional: Negative for chills and fever.  Respiratory: Positive for cough, shortness of breath and wheezing.   Cardiovascular: Negative for chest pain and leg swelling.  Gastrointestinal: Negative for abdominal pain, constipation, diarrhea and vomiting.  Neurological: Negative for dizziness and headaches.  All other systems reviewed and are negative.   Physical Exam Updated Vital Signs BP 131/79   Pulse 79   Temp 98.8 F (37.1 C) (Oral)   Resp 17   Ht 6\' 1"  (1.854 m)   Wt 81.6 kg   SpO2 100%   BMI 23.75  kg/m   Physical Exam Vitals and nursing note reviewed.  Constitutional:      General: He is not in acute distress.    Appearance: He is not ill-appearing.  HENT:     Head: Normocephalic.  Eyes:     Pupils: Pupils are equal, round, and reactive to light.  Cardiovascular:     Rate and Rhythm: Normal rate and regular rhythm.     Pulses: Normal pulses.     Heart sounds: Normal heart sounds. No murmur. No friction rub. No gallop.   Pulmonary:     Effort: Pulmonary effort is normal.     Breath sounds: Normal breath sounds.     Comments: Respirations equal and unlabored, patient able to speak in full sentences, lungs clear to auscultation bilaterally Abdominal:     General: Abdomen is flat. Bowel sounds are normal. There is no distension.     Palpations: Abdomen is soft.     Tenderness: There is no abdominal tenderness. There is no guarding or rebound.  Musculoskeletal:     Cervical back: Neck supple.     Comments: Able to move all 4 extremities without difficulty.  No lower extremity edema.  Negative Homans sign bilaterally.  Skin:    General: Skin is warm and dry.  Neurological:     General: No focal deficit present.     Mental Status: He is alert.  Psychiatric:        Mood and Affect: Mood normal.        Behavior: Behavior normal.     ED Results / Procedures / Treatments   Labs (all labs ordered are listed, but only abnormal results are displayed) Labs Reviewed  CBC WITH DIFFERENTIAL/PLATELET - Abnormal; Notable for the following components:      Result Value   WBC 12.8 (*)    Neutro Abs 9.6 (*)    All other components within normal limits  BASIC METABOLIC PANEL - Abnormal; Notable for the following components:   Creatinine, Ser 1.26 (*)    All other components within normal limits    EKG None  Radiology DG Chest 2 View  Result Date: 02/24/2019 CLINICAL DATA:  Shortness of breath. COVID-19 positive last month with worsening shortness of breath. EXAM: CHEST - 2  VIEW COMPARISON:  06/12/2015 FINDINGS: Lungs are hyperexpanded without focal airspace consolidation or effusion. Subtle prominence of the infrahilar bronchovascular markings. Mild increased lucency of the mid to upper lungs suggesting emphysematous disease. Cardiomediastinal silhouette and remainder the exam is unchanged. IMPRESSION: 1.  No acute cardiopulmonary disease. 2.  Suggestion of emphysematous disease. Electronically Signed   By: 08/12/2015 M.D.   On: 02/24/2019 16:39    Procedures Procedures (including critical care time)  Medications Ordered in ED Medications  dexamethasone (DECADRON) injection 10 mg (10 mg Intravenous Not Given 02/24/19 1707)  albuterol (VENTOLIN HFA) 108 (90 Base)  MCG/ACT inhaler 4 puff (4 puffs Inhalation Given 02/24/19 1556)  AeroChamber Plus Flo-Vu Medium MISC 1 each (1 each Other Given 02/24/19 1557)    ED Course  I have reviewed the triage vital signs and the nursing notes.  Pertinent labs & imaging results that were available during my care of the patient were reviewed by me and considered in my medical decision making (see chart for details).  Clinical Course as of Feb 24 1711  Sat Feb 24, 2019  1652 WBC(!): 12.8 [CA]    Clinical Course User Index [CA] Suzy Bouchard, [place]   MDM Rules/Calculators/A&P                     Old male presents to the ED due to worsening shortness of breath x2 months.  Patient tested positive for Covid on 01/27/2019 but admits to having chronic shortness of breath prior to infection. He has a history of COPD and is currently using albuterol and Symbicort.  Upon arrival, patient is afebrile, mildly tachycardic at 102 with O2 saturation at 100% on room air.  Patient no acute distress and non-ill-appearing.  Physical exam reassuring with lungs clear to auscultation bilaterally.  Patient speaking in full sentences.  No accessory muscle usage. No lower extremity edema. Negative homans sign bilaterally. Suspect chronic COPD with  worsening shortness of breath due to residual effect of COVID infection. Will obtain routine labs and CXR.   CBC significant for leukocytosis at 12.8, but otherwise reassuring.  BMP reassuring with mild elevation creatinine at 1.26. CXR personally reviewed which demonstrates: 1. No acute cardiopulmonary disease.    2. Suggestion of emphysematous disease.   EKG personally reviewed which demonstrates normal sinus rhythm with no signs of ischemia.  Patient ambulated here in the ED and maintained O2 saturation above 98% the entire time with no difficulties.  Informed by RN that patient declined Decadron; however agrees to take outpatient prednisone. Presentation not concerning for DVT/PE. Tachycardia resolved while in the ED. Patient appeared anxious during initial evaluation which could attribute to initial tachycardia. Will discharge patient with albuterol and prednisone. Advised patient to follow-up with PCP within the next week to follow-up with symptoms. Strict ED precautions discussed with patient. Patient states understanding and agrees to plan. Patient discharged home in no acute distress and stable vitals  Final Clinical Impression(s) / ED Diagnoses Final diagnoses:  Shortness of breath    Rx / DC Orders ED Discharge Orders         Ordered    predniSONE (STERAPRED UNI-PAK 21 TAB) 10 MG (21) TBPK tablet  Daily     02/24/19 1713           Karie Kirks 02/24/19 Ann Lions, MD 02/24/19 2025

## 2019-02-24 NOTE — ED Notes (Signed)
Pt was able to ambulate while maintaining 02 levels above 98%. Pt was short of breath while ambulating but did not have concerning respiratory distress.

## 2019-02-24 NOTE — ED Triage Notes (Addendum)
Patient states he has COPD. Got covid last month and it has worsened his breathing. Patient said he has had increased difficulty breathing for the past month. Patient does not wear o2 at baseline. o2 sat 100% RA in triage. Patient states he is also starting to forget things. Like forgetting what he is doing while he is doing it.

## 2019-02-24 NOTE — ED Notes (Signed)
Pt requesting medications be sent to [facility], [place], [place] made aware.

## 2019-02-24 NOTE — Discharge Instructions (Addendum)
As discussed, all of your labs and CXR looked good today. I suspect that your shortness of breath is due to COPD with residual effects from COVID infection. I am sending you home with albuterol and prednisone. Take as prescribed. Follow-up with PCP within next week to recheck symptoms. Return to the ER for new or worsening symptoms.

## 2019-02-26 MED FILL — ALBUTEROL SULFATE HFA 108 (: 108 (90 BAS | 25 days supply | Qty: 18 | Fill #0

## 2019-02-26 MED FILL — predniSONE 10 MG TABS: 10 | 7 days supply | Qty: 21 | Fill #0

## 2019-02-26 MED FILL — ALBUTEROL SUL 2.5 MG/3 ML S: (2.5 MG/3ML | 6 days supply | Qty: 75 | Fill #0

## 2019-03-19 NOTE — Progress Notes (Signed)
Subjective:    Patient ID: Richard Fuentes, male    DOB: [birth year], 49 y.o.   MRN: [id number]  48 y.o.M with Copd/ asthma with freq ED visits ref from PCP for pulmonary evaluation The patient notes since 2013 the onset of asthma type symptoms of the progressively gotten worse he is never seen a pulmonologist.  At that time he also had significant periodontal disease and a dental infection that was not addressed.  Patient has history of generalized anxiety disorder and obsessive-compulsive disorder and this prevents him from taking medications at times.  He is very fearful of any particular treatments and tries to minimize medications.  He worked in [employer] but now is pursuing disability and hoping to achieve Medicaid assistance  The patient has quit smoking since January 2020 but was a heavy smoker prior to that time.  He was in the emergency room in February given a pulse dose of prednisone but did not seem to help.  He has been followed at [facility] however he is on no medications does not return to [place] since prior to Covid onset.  He did himself develop Covid viral infection in January and since that time has had chronic fatigue.  The patient is on Symbicort 2 inhalations twice daily through patient assistance and states despite this is not under optimal control.  He states he has difficulty with laying flat also difficulty with eating meals without becoming full early The patient has not had pulmonary functions as of now  Shortness of Breath This is a chronic problem. The current episode started more than 1 year ago. The problem occurs constantly. The problem has been rapidly worsening. Associated symptoms include chest pain, headaches, orthopnea, PND, sputum production and wheezing. Pertinent negatives include no ear pain, hemoptysis or rhinorrhea. The symptoms are aggravated by any activity, exercise, weather changes, fumes, odors, pollens, emotional upset and eating. Risk factors  include smoking. He has tried beta agonist inhalers, oral steroids and steroid inhalers for the symptoms. His past medical history is significant for allergies, asthma, COPD and pneumonia. There is no history of CAD, DVT, a heart failure or PE.   Past Medical History:  Diagnosis Date  . Anxiety   . Asthma   . COPD (chronic obstructive pulmonary disease) ([place])   . Environmental allergies   . Esophagitis   . Hx of migraines   . OCD (obsessive compulsive disorder)      History reviewed. No pertinent family history.   Social History   Socioeconomic History  . Marital status: Single    Spouse name: Not on file  . Number of children: Not on file  . Years of education: Not on file  . Highest education level: Not on file  Occupational History  . Not on file  Tobacco Use  . Smoking status: Former Smoker    Packs/day: 1.00    Types: Cigarettes    Quit date: 01/19/2018    Years since quitting: 1.1  . Smokeless tobacco: Never Used  Substance and Sexual Activity  . Alcohol use: No  . Drug use: No  . Sexual activity: Not on file  Other Topics Concern  . Not on file  Social History Narrative  . Not on file   Social Determinants of Health   Financial Resource Strain:   . Difficulty of Paying Living Expenses:   Food Insecurity:   . Worried About Charity fundraiser in the Last Year:   . [place] in the  Last Year:   Transportation Needs:   . Freight forwarder (Medical):   Marland Kitchen Lack of Transportation (Non-Medical):   Physical Activity:   . Days of Exercise per Week:   . Minutes of Exercise per Session:   Stress:   . Feeling of Stress :   Social Connections:   . Frequency of Communication with Friends and Family:   . Frequency of Social Gatherings with Friends and Family:   . Attends Religious Services:   . Active Member of Clubs or Organizations:   . Attends Banker Meetings:   Marland Kitchen Marital Status:   Intimate Partner Violence:   . Fear of Current or  Ex-Partner:   . Emotionally Abused:   Marland Kitchen Physically Abused:   . Sexually Abused:      Allergies  Allergen Reactions  . Penicillins Other (See Comments)    Childhood allergy  Has patient had a PCN reaction causing immediate rash, facial/tongue/throat swelling, SOB or lightheadedness with hypotension: n/a Has patient had a PCN reaction causing severe rash involving mucus membranes or skin necrosis: n/a Has patient had a PCN reaction that required hospitalization: n/a Has patient had a PCN reaction occurring within the last 10 years: n/a If all of the above answers are "NO", then may proceed with Cephalosporin use.      Outpatient Medications Prior to Visit  Medication Sig Dispense Refill  . albuterol (PROVENTIL) (2.5 MG/3ML) 0.083% nebulizer solution Take 3 mLs (2.5 mg total) by nebulization every 6 (six) hours as needed for wheezing or shortness of breath. 75 mL 1  . albuterol (VENTOLIN HFA) 108 (90 Base) MCG/ACT inhaler Inhale 2 puffs into the lungs every 6 (six) hours as needed for wheezing or shortness of breath. 19 g 1  . budesonide-formoterol (SYMBICORT) 160-4.5 MCG/ACT inhaler Inhale 2 puffs into the lungs 2 (two) times daily. 1 Inhaler 6  . albuterol (VENTOLIN HFA) 108 (90 Base) MCG/ACT inhaler Inhale 1-2 puffs into the lungs every 6 (six) hours as needed for wheezing or shortness of breath (COUGH). 18 g 1  . montelukast (SINGULAIR) 10 MG tablet Take 1 tablet (10 mg total) by mouth at bedtime. (Patient not taking: Reported on 03/20/2019) 30 tablet 3  . predniSONE (DELTASONE) 10 MG tablet 6,5,4,3,2,1 take each days dose at once with breakfast (Patient not taking: Reported on 02/24/2019) 21 tablet 0   No facility-administered medications prior to visit.      Review of Systems  HENT: Negative for ear pain and rhinorrhea.   Respiratory: Positive for sputum production, shortness of breath and wheezing. Negative for hemoptysis.   Cardiovascular: Positive for chest pain, orthopnea and  PND.  Neurological: Positive for headaches.       Objective:   Physical Exam Vitals:   03/20/19 1513  BP: 120/83  Pulse: 99  Temp: 98 F (36.7 C)  TempSrc: Oral  SpO2: 100%  Weight: 188 lb 3.2 oz (85.4 kg)  Height: 6\' 1"  (1.854 m)    Gen: Pleasant, well-nourished, in no distress, anxious  affect  ENT: No lesions,  mouth clear,  oropharynx clear, no postnasal drip  Neck: No JVD, no TMG, no carotid bruits  Lungs: No use of accessory muscles, no dullness to percussion, distant breath sounds few expired wheezes  Cardiovascular: RRR, heart sounds normal, no murmur or gallops, no peripheral edema  Abdomen: soft and NT, no HSM,  BS normal  Musculoskeletal: No deformities, no cyanosis or clubbing  Neuro: alert, non focal  Skin: Warm, no lesions or  rashes  CXR reviewed 02/2019:  Hyperinflation and changes c/w centrilobular emphysema     Assessment & Plan:  I personally reviewed all images and lab data in the [facility] system as well as any outside material available during this office visit and agree with the  radiology impressions.   COPD exacerbation (HCC) Likely severe COPD d/t smoking hx now off tobacco since 01/2018.   May have reactive component with mixed asthma/copd.   Significant airtrapping noted. Anxiety component promotes more air trapping in this case.    Pt HFA use was poor.  I reinstructed as to proper use, he does have a spacer device at home.     He would benefit from combined anticholinergic dilator and LABA with ICS   Trelegy would be a good choice.  Pred pulse was given at this visit,  Applied for PASS with pharm tech for Trelegy 200 one puff daily, given sample on hand of Trelegy 100 and will d/c Symbicort.  Cont albuterol prn.  Obtain full PFTs.  Pt is self pay, trying to hold off on many expensive tests so will hold off on other labs today and CT chest.  Pt is applying for disability, PFTs will help.    Hoping for Medicaid via disability so held off on  CAFA letter/orange/blue card   History of tobacco use The pt clearly has stopped smoking and encouraged persistence in this area as he relates desire to smoke still daily occurs  Chronic periodontal disease Chronic periodontal disease and carious teeth  Given dental referral resources   Diagnoses and all orders for this visit:  COPD exacerbation (HCC) -     Pulmonary Function Test; Future  Encounter for preoperative screening laboratory testing for COVID-19 virus -     Novel Coronavirus, NAA ([facility])  History of tobacco use  Chronic periodontal disease  Other orders -     predniSONE (DELTASONE) 10 MG tablet; Take 4 tablets daily for 5 days then stop -     Fluticasone-Umeclidin-Vilant (TRELEGY ELLIPTA) 200-62.5-25 MCG/INH AEPB; Inhale 1 puff into the lungs daily.

## 2019-03-20 ENCOUNTER — Other Ambulatory Visit: Payer: Self-pay

## 2019-03-20 ENCOUNTER — Ambulatory Visit: Payer: Self-pay | Attending: Critical Care Medicine | Admitting: Critical Care Medicine

## 2019-03-20 ENCOUNTER — Encounter: Payer: Self-pay | Admitting: Critical Care Medicine

## 2019-03-20 VITALS — BP 120/83 | HR 99 | Temp 98.0°F | Ht 73.0 in | Wt 188.2 lb

## 2019-03-20 DIAGNOSIS — Z20822 Contact with and (suspected) exposure to covid-19: Secondary | ICD-10-CM

## 2019-03-20 DIAGNOSIS — Z87891 Personal history of nicotine dependence: Secondary | ICD-10-CM

## 2019-03-20 DIAGNOSIS — K056 Periodontal disease, unspecified: Secondary | ICD-10-CM

## 2019-03-20 DIAGNOSIS — J441 Chronic obstructive pulmonary disease with (acute) exacerbation: Secondary | ICD-10-CM

## 2019-03-20 DIAGNOSIS — Z01812 Encounter for preprocedural laboratory examination: Secondary | ICD-10-CM

## 2019-03-20 MED ORDER — PREDNISONE 10 MG PO TABS: ORAL_TABLET | ORAL | 0 refills | Status: DC

## 2019-03-20 MED ORDER — TRELEGY ELLIPTA 200-62.5-25 MCG/INH IN AEPB: 1.0000 | INHALATION_SPRAY | Freq: Every day | RESPIRATORY_TRACT | 3 refills | Status: DC

## 2019-03-20 MED FILL — !TRELEGY ELLIPTA 100-62.5-2: 100-62.5-25 | 30 days supply | Qty: 60 | Fill #0

## 2019-03-20 MED FILL — predniSONE 10 MG TABS: 10 | 5 days supply | Qty: 20 | Fill #0

## 2019-03-20 NOTE — Patient Instructions (Addendum)
We are going to change Trelegy to take 1 inhalation daily a sample be given today  Stop taking Symbicort and begin the Trelegy inhaler  Prednisone will be given take for these daily for 5 days  Use albuterol as needed  A pulmonary function test will be obtained and you will need to get a Covid test 3 days prior to the pulmonary function study the nurse will show you how this works  Return to Dr. Joya Gaskins in 6 weeks  That likely were open again scheduling patient Chronic Obstructive Pulmonary Disease Chronic obstructive pulmonary disease (COPD) is a long-term (chronic) lung problem. When you have COPD, it is hard for air to get in and out of your lungs. Usually the condition gets worse over time, and your lungs will never return to normal. There are things you can do to keep yourself as healthy as possible.  Your doctor may treat your condition with: ? Medicines. ? Oxygen. ? Lung surgery.  Your doctor may also recommend: ? Rehabilitation. This includes steps to make your body work better. It may involve a team of specialists. ? Quitting smoking, if you smoke. ? Exercise and changes to your diet. ? Comfort measures (palliative care). Follow these instructions at home: Medicines  Take over-the-counter and prescription medicines only as told by your doctor.  Talk to your doctor before taking any cough or allergy medicines. You may need to avoid medicines that cause your lungs to be dry. Lifestyle  If you smoke, stop. Smoking makes the problem worse. If you need help quitting, ask your doctor.  Avoid being around things that make your breathing worse. This may include smoke, chemicals, and fumes.  Stay active, but remember to rest as well.  Learn and use tips on how to relax.  Make sure you get enough sleep. Most adults need at least 7 hours of sleep every night.  Eat healthy foods. Eat smaller meals more often. Rest before meals. Controlled breathing Learn and use tips on how  to control your breathing as told by your doctor. Try:  Breathing in (inhaling) through your nose for 1 second. Then, pucker your lips and breath out (exhale) through your lips for 2 seconds.  Putting one hand on your belly (abdomen). Breathe in slowly through your nose for 1 second. Your hand on your belly should move out. Pucker your lips and breathe out slowly through your lips. Your hand on your belly should move in as you breathe out.  Controlled coughing Learn and use controlled coughing to clear mucus from your lungs. Follow these steps: 1. Lean your head a little forward. 2. Breathe in deeply. 3. Try to hold your breath for 3 seconds. 4. Keep your mouth slightly open while coughing 2 times. 5. Spit any mucus out into a tissue. 6. Rest and do the steps again 1 or 2 times as needed. General instructions  Make sure you get all the shots (vaccines) that your doctor recommends. Ask your doctor about a flu shot and a pneumonia shot.  Use oxygen therapy and pulmonary rehabilitation if told by your doctor. If you need home oxygen therapy, ask your doctor if you should buy a tool to measure your oxygen level (oximeter).  Make a COPD action plan with your doctor. This helps you to know what to do if you feel worse than usual.  Manage any other conditions you have as told by your doctor.  Avoid going outside when it is very hot, cold, or humid.  Avoid people who have a sickness you can catch (contagious).  Keep all follow-up visits as told by your doctor. This is important. Contact a doctor if:  You cough up more mucus than usual.  There is a change in the color or thickness of the mucus.  It is harder to breathe than usual.  Your breathing is faster than usual.  You have trouble sleeping.  You need to use your medicines more often than usual.  You have trouble doing your normal activities such as getting dressed or walking around the house. Get help right away if:  You  have shortness of breath while resting.  You have shortness of breath that stops you from: ? Being able to talk. ? Doing normal activities.  Your chest hurts for longer than 5 minutes.  Your skin color is more blue than usual.  Your pulse oximeter shows that you have low oxygen for longer than 5 minutes.  You have a fever.  You feel too tired to breathe normally. Summary  Chronic obstructive pulmonary disease (COPD) is a long-term lung problem.  The way your lungs work will never return to normal. Usually the condition gets worse over time. There are things you can do to keep yourself as healthy as possible.  Take over-the-counter and prescription medicines only as told by your doctor.  If you smoke, stop. Smoking makes the problem worse. This information is not intended to replace advice given to you by your health care provider. Make sure you discuss any questions you have with your health care provider. Document Revised: 12/03/2016 Document Reviewed: 01/26/2016 Elsevier Patient Education  2020 [employer].

## 2019-03-20 NOTE — Progress Notes (Signed)
Emergency room f/u c/o breathing not getting better

## 2019-03-21 DIAGNOSIS — K056 Periodontal disease, unspecified: Secondary | ICD-10-CM | POA: Insufficient documentation

## 2019-03-21 NOTE — Assessment & Plan Note (Signed)
The pt clearly has stopped smoking and encouraged persistence in this area as he relates desire to smoke still daily occurs

## 2019-03-21 NOTE — Assessment & Plan Note (Signed)
Likely severe COPD d/t smoking hx now off tobacco since 01/2018.   May have reactive component with mixed asthma/copd.   Significant airtrapping noted. Anxiety component promotes more air trapping in this case.    Pt HFA use was poor.  I reinstructed as to proper use, he does have a spacer device at home.     He would benefit from combined anticholinergic dilator and LABA with ICS   Trelegy would be a good choice.  Pred pulse was given at this visit,  Applied for PASS with pharm tech for Trelegy 200 one puff daily, given sample on hand of Trelegy 100 and will d/c Symbicort.  Cont albuterol prn.  Obtain full PFTs.  Pt is self pay, trying to hold off on many expensive tests so will hold off on other labs today and CT chest.  Pt is applying for disability, PFTs will help.    Hoping for Medicaid via disability so held off on CAFA letter/orange/blue card

## 2019-03-21 NOTE — Assessment & Plan Note (Signed)
Chronic periodontal disease and carious teeth  Given dental referral resources

## 2019-04-06 ENCOUNTER — Inpatient Hospital Stay (HOSPITAL_COMMUNITY)
Admission: RE | Admit: 2019-04-06 | Discharge: 2019-04-06 | Disposition: A | Discharge status: Home / Self Care | Payer: Self-pay | Source: Ambulatory Visit

## 2019-04-06 NOTE — Progress Notes (Signed)
Pt tested positive for covid-19 on 01/27/19-results found in [facility] from [facility]. Pt will not be tested today because positive results fall within 90 day window. Pt verbalizes understanding.   Lab Results [facility]  LUMIRADX SARS-COV-2 AG TEST Component Name 01/27/2019   Positive (A)   Viviano Simas, RN

## 2019-04-09 ENCOUNTER — Ambulatory Visit (HOSPITAL_COMMUNITY)
Admission: RE | Admit: 2019-04-09 | Discharge: 2019-04-09 | Disposition: A | Discharge status: Home / Self Care | Payer: Medicaid Other | Source: Ambulatory Visit | Attending: Critical Care Medicine | Admitting: Critical Care Medicine

## 2019-04-09 ENCOUNTER — Other Ambulatory Visit: Payer: Self-pay

## 2019-04-09 ENCOUNTER — Inpatient Hospital Stay (HOSPITAL_COMMUNITY): Admission: RE | Admit: 2019-04-09 | Payer: Self-pay | Source: Ambulatory Visit

## 2019-04-09 DIAGNOSIS — J441 Chronic obstructive pulmonary disease with (acute) exacerbation: Secondary | ICD-10-CM | POA: Insufficient documentation

## 2019-04-09 LAB — PULMONARY FUNCTION TEST
DL/VA % pred: 61 %
DL/VA: 2.71 ml/min/mmHg/L
DLCO unc % pred: 33 %
DLCO unc: 10.64 ml/min/mmHg
FEF 25-75 Post: 0.53 L/sec
FEF 25-75 Pre: 0.36 L/sec
FEF2575-%Change-Post: 49 %
FEF2575-%Pred-Post: 14 %
FEF2575-%Pred-Pre: 9 %
FEV1-%Change-Post: 10 %
FEV1-%Pred-Post: 28 %
FEV1-%Pred-Pre: 25 %
FEV1-Post: 1.24 L
FEV1-Pre: 1.12 L
FEV1FVC-%Change-Post: 7 %
FEV1FVC-%Pred-Pre: 49 %
FEV6-%Change-Post: 14 %
FEV6-%Pred-Post: 51 %
FEV6-%Pred-Pre: 44 %
FEV6-Post: 2.75 L
FEV6-Pre: 2.4 L
FEV6FVC-%Change-Post: 11 %
FEV6FVC-%Pred-Post: 94 %
FEV6FVC-%Pred-Pre: 84 %
FVC-%Change-Post: 2 %
FVC-%Pred-Post: 54 %
FVC-%Pred-Pre: 52 %
FVC-Post: 3.02 L
FVC-Pre: 2.94 L
Post FEV1/FVC ratio: 41 %
Post FEV6/FVC ratio: 91 %
Pre FEV1/FVC ratio: 38 %
Pre FEV6/FVC Ratio: 82 %
RV % pred: 255 %
RV: 5.62 L
TLC % pred: 111 %
TLC: 8.43 L

## 2019-04-09 MED ORDER — ALBUTEROL SULFATE (2.5 MG/3ML) 0.083% IN NEBU
2.5000 mg | INHALATION_SOLUTION | Freq: Once | RESPIRATORY_TRACT | Status: AC
  Administered 2019-04-09: 2.5 mg via RESPIRATORY_TRACT

## 2019-04-12 ENCOUNTER — Telehealth: Payer: Self-pay | Admitting: Critical Care Medicine

## 2019-04-12 DIAGNOSIS — Z122 Encounter for screening for malignant neoplasm of respiratory organs: Secondary | ICD-10-CM

## 2019-04-12 DIAGNOSIS — J432 Centrilobular emphysema: Secondary | ICD-10-CM

## 2019-04-12 DIAGNOSIS — Z87891 Personal history of nicotine dependence: Secondary | ICD-10-CM

## 2019-04-12 NOTE — Telephone Encounter (Signed)
Called centralized scheduling to schedule "CT chest for lung CA screen" and was told that patient is not old enough yet. he has to be b/t the ages of 55-22yrs old and have to have smoked 1 pks per day for 30 years and current smoker or quit within the last 15 days.    Staff not able to schedule CT at this time and message will be sent to provider.

## 2019-04-12 NOTE — Telephone Encounter (Signed)
Pt aware of results  Gold D copd  ?transplant candidate?  ?portable oxygen?  Pt to be seen 4/27  Will order low dose CT chest for lung CA screen .   See if scholarship available: call [place] pulm office.

## 2019-04-13 NOTE — Telephone Encounter (Signed)
I have ordered a regular CT chest for the pt

## 2019-04-13 NOTE — Addendum Note (Signed)
Addended by: Shan Levans E on: 04/13/2019 03:25 PM   Modules accepted: Orders

## 2019-04-13 NOTE — Telephone Encounter (Signed)
Spoke with patient and informed him with appt for his CT. Appt for CT is April 20th at 9:30 but arrive at 9:15 at [facility] Radiology department.

## 2019-04-24 ENCOUNTER — Other Ambulatory Visit: Payer: Self-pay

## 2019-04-24 ENCOUNTER — Ambulatory Visit (HOSPITAL_COMMUNITY)
Admission: RE | Admit: 2019-04-24 | Discharge: 2019-04-24 | Disposition: A | Discharge status: Home / Self Care | Payer: Medicaid Other | Source: Ambulatory Visit | Attending: Critical Care Medicine | Admitting: Critical Care Medicine

## 2019-04-24 ENCOUNTER — Telehealth: Payer: Self-pay | Admitting: Nurse Practitioner

## 2019-04-24 DIAGNOSIS — J432 Centrilobular emphysema: Secondary | ICD-10-CM | POA: Diagnosis not present

## 2019-04-24 MED FILL — $TRELEGY 200-62.5-25: 200-62.5-25 | 90 days supply | Qty: 180 | Fill #0

## 2019-04-24 NOTE — Telephone Encounter (Signed)
Pt called about CT scan and wants Dr. Delford Field to call him back.

## 2019-04-24 NOTE — Telephone Encounter (Signed)
I spoke to the patient ,  He knows results are not yet available.   He is asking for his records to be sent to medicaid to apply for disability

## 2019-04-27 ENCOUNTER — Telehealth: Payer: Self-pay | Admitting: Nurse Practitioner

## 2019-04-27 NOTE — Telephone Encounter (Signed)
Called patient and informed him to come in and sign a release of information in order for [place] to release his records. Patient understood and said he would come in.

## 2019-05-01 ENCOUNTER — Encounter: Payer: Self-pay | Admitting: Critical Care Medicine

## 2019-05-01 ENCOUNTER — Telehealth: Payer: Self-pay

## 2019-05-01 ENCOUNTER — Ambulatory Visit: Payer: Self-pay | Attending: Critical Care Medicine | Admitting: Critical Care Medicine

## 2019-05-01 ENCOUNTER — Other Ambulatory Visit: Payer: Self-pay

## 2019-05-01 VITALS — BP 117/81 | HR 95 | Temp 97.7°F | Ht 73.0 in | Wt 189.0 lb

## 2019-05-01 DIAGNOSIS — F411 Generalized anxiety disorder: Secondary | ICD-10-CM

## 2019-05-01 DIAGNOSIS — J432 Centrilobular emphysema: Secondary | ICD-10-CM

## 2019-05-01 DIAGNOSIS — J439 Emphysema, unspecified: Secondary | ICD-10-CM

## 2019-05-01 DIAGNOSIS — G4736 Sleep related hypoventilation in conditions classified elsewhere: Secondary | ICD-10-CM

## 2019-05-01 MED ORDER — IPRATROPIUM-ALBUTEROL 0.5-2.5 (3) MG/3ML IN SOLN: 3.0000 mL | Freq: Four times a day (QID) | RESPIRATORY_TRACT | 0 refills | Status: DC | PRN

## 2019-05-01 MED FILL — IPRAT-ALBUT 0.5-3(2.5) MG/3: 0.5-2.5 (3) | 30 days supply | Qty: 360 | Fill #0

## 2019-05-01 NOTE — Patient Instructions (Signed)
Stay on Trelegy  A referral to [facility] transplant evaluation clinic will be made  A blood gas will be obtained  We will order an overnight oxygen evaluation to assess for low oxygen at night  Return Dr Delford Field with video visit in 1 month

## 2019-05-01 NOTE — Telephone Encounter (Signed)
Met with the patient when he was in the clinic today.  He explained that he has not been able to work as he becomes short of breath with little exertion.  He said he has started to apply for disability but has not contacted an attorney and has tried to contact the SSA without success.  He is currently staying with his aunt but would otherwise be homeless.  He has no income and no insurance.   As per [facility], they may be able to assist with a disability / medicaid application through the [place] program.  Patient was in agreement to [facility] referral and signed the necessary document to place referral.  Message left for [facility] inquiring if the patient can have ONO done despite being uninsured.

## 2019-05-01 NOTE — Progress Notes (Signed)
Subjective:    Patient ID: Richard Fuentes, male    DOB: 03-06-1970, 49 y.o.   MRN: [id number]  49 y.o.M with Copd/ asthma with freq ED visits ref from PCP for pulmonary evaluation The patient notes since 2013 the onset of asthma type symptoms of the progressively gotten worse he is never seen a pulmonologist.  At that time he also had significant periodontal disease and a dental infection that was not addressed.  Patient has history of generalized anxiety disorder and obsessive-compulsive disorder and this prevents him from taking medications at times.  He is very fearful of any particular treatments and tries to minimize medications.  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  He was in the emergency room in February given a pulse dose of prednisone but did not seem to help.  He has been followed at [facility] however he is on no medications does not return to [place] since prior to Covid onset.  He did himself develop Covid viral infection in January and since that time has had chronic fatigue.  The patient is on Symbicort 2 inhalations twice daily through patient assistance and states despite this is not under optimal control.  He states he has difficulty with laying flat also difficulty with eating meals without becoming full early The patient has not had pulmonary functions as of now  05/01/2019 Patient seen in return follow-up for severe COPD with bullous emphysema.  CT scan of chest has been obtained and shows severe bullous emphysema in the apices but also centrilobular emphysema diffusely.  The patient states the Trelegy inhaler has been helpful however the patient continues to have difficulty with dyspnea on exertion.  He wakens at night with severe shortness of breath as well.  He no longer is smoking tobacco products.  Anxiety is a major issue  for this patient and he states the antianxiety medication has been of some benefit at this time.  The patient's pulmonary functions have been obtained and show Gold stage D COPD with FEV1 less than 30% predicted diffusion capacity less than 30% predicted and severe residual volume air trapping    Shortness of Breath This is a chronic problem. The current episode started more than 1 year ago. The problem occurs constantly. The problem has been rapidly worsening. Associated symptoms include chest pain, headaches, orthopnea, PND, sputum production and wheezing. Pertinent negatives include no ear pain, hemoptysis or rhinorrhea. The symptoms are aggravated by any activity, exercise, weather changes, fumes, odors, pollens, emotional upset and eating. Risk factors include smoking. He has tried beta agonist inhalers, oral steroids and steroid inhalers for the symptoms. His past medical history is significant for allergies, asthma, COPD and pneumonia. There is no history of CAD, DVT, a heart failure or PE.   Past Medical History:  Diagnosis Date  . Anxiety   . Asthma   . COPD (chronic obstructive pulmonary disease) (HCC)   . Environmental allergies   . Esophagitis   . Hx of migraines   . OCD (obsessive compulsive disorder)      No family history on file.   Social History   Socioeconomic History  . Marital status: Single    Spouse name: Not on file  . Number of children: Not on file  . Years of education: Not on file  . Highest education level: Not on file  Occupational History  . Not on file  Tobacco Use  . Smoking status: Former Smoker    Packs/day: 1.00    Types: Cigarettes    Quit date: 01/19/2018    Years since quitting: 1.2  . Smokeless tobacco: Never Used  Substance and Sexual Activity  . Alcohol use: No  . Drug use: No  . Sexual activity: Not on file  Other Topics Concern  . Not on file  Social History Narrative  . Not on file   Social Determinants of Health   Financial  Resource Strain:   . Difficulty of Paying Living Expenses:   Food Insecurity:   . Worried About Programme researcher, broadcasting/film/video in the Last Year:   . Barista in the Last Year:   Transportation Needs:   . Freight forwarder (Medical):   Marland Kitchen Lack of Transportation (Non-Medical):   Physical Activity:   . Days of Exercise per Week:   . Minutes of Exercise per Session:   Stress:   . Feeling of Stress :   Social Connections:   . Frequency of Communication with Friends and Family:   . Frequency of Social Gatherings with Friends and Family:   . Attends Religious Services:   . Active Member of Clubs or Organizations:   . Attends Banker Meetings:   Marland Kitchen Marital Status:   Intimate Partner Violence:   . Fear of Current or Ex-Partner:   . Emotionally Abused:   Marland Kitchen Physically Abused:   . Sexually Abused:      Allergies  Allergen Reactions  . Penicillins Other (See Comments)    Childhood allergy  Has patient had a PCN reaction causing immediate rash, facial/tongue/throat swelling, SOB or lightheadedness with hypotension: n/a Has patient had a PCN reaction causing severe rash involving mucus membranes or skin necrosis: n/a Has patient had a PCN reaction that required hospitalization: n/a Has patient had a PCN reaction occurring within the last 10 years: n/a If all of the above answers are "NO", then may proceed with Cephalosporin use.      Outpatient Medications Prior to Visit  Medication Sig Dispense Refill  . albuterol (VENTOLIN HFA) 108 (90 Base) MCG/ACT inhaler Inhale 2 puffs into the lungs every 6 (six) hours as needed for wheezing or shortness of breath. 19 g 1  . Fluticasone-Umeclidin-Vilant (TRELEGY ELLIPTA) 200-62.5-25 MCG/INH AEPB Inhale 1 puff into the lungs daily. 60 each 3  . albuterol (PROVENTIL) (2.5 MG/3ML) 0.083% nebulizer solution Take 3 mLs (2.5 mg total) by nebulization every 6 (six) hours as needed for wheezing or shortness of breath. 75 mL 1  . predniSONE  (DELTASONE) 10 MG tablet Take 4 tablets daily for 5 days then stop (Patient not taking: Reported on 05/01/2019) 20 tablet 0   No facility-administered medications prior to visit.      Review of Systems  HENT: Negative for ear pain and rhinorrhea.   Respiratory: Positive for sputum production, shortness of breath and wheezing. Negative for hemoptysis.   Cardiovascular: Positive for chest pain, orthopnea and PND.  Neurological: Positive for headaches.       Objective:   Physical Exam Vitals:   05/01/19 1512  BP: 117/81  Pulse: 95  Temp: 97.7 F (36.5 C)  TempSrc: Temporal  SpO2: 97%  Weight: 189 lb (85.7 kg)  Height: 6\' 1"  (1.854 m)   Ambulatory pulse ox is obtained and the patient ambulating about the office with no desaturation below 95% after 3 laps  Gen: Pleasant,  well-nourished, in no distress, anxious  affect  ENT: No lesions,  mouth clear,  oropharynx clear, no postnasal drip  Neck: No JVD, no TMG, no carotid bruits  Lungs: No use of accessory muscles, no dullness to percussion, distant breath sounds few expired wheezes  Cardiovascular: RRR, heart sounds normal, no murmur or gallops, no peripheral edema  Abdomen: soft and NT, no HSM,  BS normal  Musculoskeletal: No deformities, no cyanosis or clubbing  Neuro: alert, non focal  Skin: Warm, no lesions or rashes  CXR reviewed 02/2019:  Hyperinflation and changes c/w centrilobular emphysema  CT Chest: 4/20 FINDINGS: Cardiovascular: Heart is normal in size. Trace anterior pericardial fluid.  No evidence of thoracic aortic aneurysm.  Mediastinum/Nodes: No suspicious mediastinal lymphadenopathy.  Visualized thyroid is unremarkable.  Lungs/Pleura: Moderate to severe centrilobular and paraseptal emphysematous changes, upper lung predominant.  No focal consolidation.  No suspicious pulmonary nodules.  No pleural effusion or pneumothorax.  Upper Abdomen: Visualized upper abdomen is grossly  unremarkable.  Musculoskeletal: Visualized osseous structures are within normal limits.  IMPRESSION: No evidence of acute cardiopulmonary disease.  Emphysema (ICD10-J43.9).  PFTs 4/6:  Ref Range & Units 3 wk ago  FVC-Pre L 2.94   FVC-%Pred-Pre % 52   FVC-Post L 3.02   FVC-%Pred-Post % 54   FVC-%Change-Post % 2   FEV1-Pre L 1.12   FEV1-%Pred-Pre % 25   FEV1-Post L 1.24   FEV1-%Pred-Post % 28   FEV1-%Change-Post % 10   FEV6-Pre L 2.40   FEV6-%Pred-Pre % 44   FEV6-Post L 2.75   FEV6-%Pred-Post % 51   FEV6-%Change-Post % 14   Pre FEV1/FVC ratio % 38   FEV1FVC-%Pred-Pre % 49   Post FEV1/FVC ratio % 41   FEV1FVC-%Change-Post % 7   Pre FEV6/FVC Ratio % 82   FEV6FVC-%Pred-Pre % 84   Post FEV6/FVC ratio % 91   FEV6FVC-%Pred-Post % 94   FEV6FVC-%Change-Post % 11   FEF 25-75 Pre L/sec 0.36   FEF2575-%Pred-Pre % 9   FEF 25-75 Post L/sec 0.53   FEF2575-%Pred-Post % 14   FEF2575-%Change-Post % 49   RV L 5.62   RV % pred % 255   TLC L 8.43   TLC % pred % 111   DLCO unc ml/min/mmHg 10.64   DLCO unc % pred % 33   DL/VA ml/min/mmHg/L 9.98   DL/VA % pred % 61        Assessment & Plan:  I personally reviewed all images and lab data in the [facility] system as well as any outside material available during this office visit and agree with the  radiology impressions.   COPD with emphysema (HCC)GOLD D  COPD with primary emphysematous component FEV1 less than 30% predicted Gold stage D  Will refer to the [facility] transplant clinic to see if he can potentially be a transplant recipient at some point  We will add duo nebs to be taken twice daily in addition to the Trelegy daily  The patient is not a candidate for oxygen therapy on the basis of exertional saturation however will obtain a home sleep study to see if there is nocturnal desaturation that occurs     Diagnoses and all orders for this visit:  Centrilobular emphysema (HCC) -     Blood Gas, Arterial -     Ambulatory  referral to Pulmonology  Nocturnal hypoxemia due to emphysema (HCC) -     Home sleep test; Future -     Blood Gas, Arterial  Generalized anxiety  disorder  Other orders -     ipratropium-albuterol (DUONEB) 0.5-2.5 (3) MG/3ML SOLN; Take 3 mLs by nebulization every 6 (six) hours as needed.

## 2019-05-02 ENCOUNTER — Encounter: Payer: Self-pay | Admitting: Critical Care Medicine

## 2019-05-02 NOTE — Assessment & Plan Note (Signed)
COPD with primary emphysematous component FEV1 less than 30% predicted Gold stage D  Will refer to the [facility] transplant clinic to see if he can potentially be a transplant recipient at some point  We will add duo nebs to be taken twice daily in addition to the Trelegy daily  The patient is not a candidate for oxygen therapy on the basis of exertional saturation however will obtain a home sleep study to see if there is nocturnal desaturation that occurs

## 2019-05-08 ENCOUNTER — Telehealth: Payer: Self-pay

## 2019-05-08 NOTE — Telephone Encounter (Signed)
Call placed to patient to inform him that the referral has been sent to [facility] for assistance with disability application. He said that he received a call from [facility] yesterday and they said they would call him back today but he has not heard from anyone.    He said that he received a call from DSS and dropped off a request for release of medical records at [facility].  Yesterday he picked up the duoneb at [facility] pharmacy.  The [facility] does not do ONO.  The patient said that he has been scheduled for a sleep study 07/19/2019.

## 2019-05-10 ENCOUNTER — Telehealth: Payer: Self-pay

## 2019-05-10 NOTE — Telephone Encounter (Signed)
email message received from [facility] noting that they are unable to assist with the disability application process as the patient has an active claim with SSA/DSS and they are not able to help in the middle of a filing.  If he is denied, they may be able to assist at that time.

## 2019-05-14 ENCOUNTER — Telehealth: Payer: Self-pay | Admitting: Nurse Practitioner

## 2019-05-14 NOTE — Telephone Encounter (Signed)
Patient called in to get an update on the patients referral to [facility] clinic and blood gas. Patient was informed of the referral cites telephone number and the note stating the blood gas would be completed . Patient stated that he thought he was to be sent to [place] for the blood gas to be completed. Please follow up at your earliest convenience.

## 2019-05-17 ENCOUNTER — Telehealth: Payer: Self-pay

## 2019-05-17 DIAGNOSIS — J9612 Chronic respiratory failure with hypercapnia: Secondary | ICD-10-CM

## 2019-05-17 DIAGNOSIS — J432 Centrilobular emphysema: Secondary | ICD-10-CM

## 2019-05-17 NOTE — Telephone Encounter (Signed)
Call received from the patient.  He noted that received a call from the [facility] and is aware that they are not able to assist with the disability application because it has already been filed.  He said that he received a letter from [employer] and has been scheduled for a psychiatric exam with  Floyde Parkins , MD on 05/25/2019.  This is regarding his disability application. He also explained that he received documents from [facility] and [employer] that he needed to review and respond to if he had questions/concerns about what was documented. This was a follow up from a phone interview.  He said that he missed the date for replying and has been having difficulty reaching [employer].  He said that he left a message but has not heard back.  Encouraged him to keep trying to contact the agency and also send the information back in writing even though it is late and make a copy of what he is sending back.    He said that he is doing well with the trelegy.  His sleep study is not scheduled until 07/19/2019.   He explained that he has access to a BiPAP machine but would like feedback from Dr Delford Field prior to using it. He also noted that his AVS from his office visit stated that a blood gas would be obtained and he would like to know the status of that order.  Regarding the referral to [facility], informed him that the [facility] referral coordinator sent the referral to [facility] 05/02/2019  She has called and left a message requesting a call back to check on the status of the referral.   Informed him that Dr Delford Field would be notified of his questions about the blood gas and BiPAP.

## 2019-05-17 NOTE — Telephone Encounter (Signed)
Tks! Erskine Squibb made pt aware!

## 2019-05-17 NOTE — Telephone Encounter (Signed)
Called pt ask to speak to Erskine Squibb, RN CM fo clarification. Transferred call to Erskine Squibb.

## 2019-05-17 NOTE — Telephone Encounter (Signed)
The referral was sent to [facility] for Lung transplant per Dr  Delford Field   Was sent on 4/28  I called  and I leave a voice mail to call me back   Sent Referral to    [facility] [address] [facility] [address] [facility] [address] [place], [place] [address] Transplant [phone] Office [phone] Fax [phone]

## 2019-05-18 DIAGNOSIS — J9612 Chronic respiratory failure with hypercapnia: Secondary | ICD-10-CM | POA: Insufficient documentation

## 2019-05-18 NOTE — Telephone Encounter (Signed)
I placed the blood gas order in April , he has to go to respiratory therapy at [facility] hospital   It has to be scheduled.    I will order again

## 2019-05-22 ENCOUNTER — Telehealth: Payer: Self-pay

## 2019-05-22 NOTE — Telephone Encounter (Signed)
Call placed to patient and informed him that Dr Delford Field has placed an order for the blood gas.  He can call the respiratory department at the hospital and schedule the appointment,  Provided him with the hospital main number

## 2019-05-28 ENCOUNTER — Other Ambulatory Visit: Payer: Self-pay

## 2019-05-28 ENCOUNTER — Ambulatory Visit (HOSPITAL_COMMUNITY)
Admission: RE | Admit: 2019-05-28 | Discharge: 2019-05-28 | Disposition: A | Discharge status: Home / Self Care | Payer: Medicaid Other | Source: Ambulatory Visit | Attending: Critical Care Medicine | Admitting: Critical Care Medicine

## 2019-05-28 DIAGNOSIS — J9612 Chronic respiratory failure with hypercapnia: Secondary | ICD-10-CM | POA: Diagnosis not present

## 2019-05-28 DIAGNOSIS — J432 Centrilobular emphysema: Secondary | ICD-10-CM | POA: Diagnosis not present

## 2019-05-28 LAB — BLOOD GAS, ARTERIAL
Acid-Base Excess: 1.9 mmol/L (ref 0.0–2.0)
Bicarbonate: 25.9 mmol/L (ref 20.0–28.0)
Drawn by: 21179
FIO2: 21
O2 Saturation: 98 %
Patient temperature: 37
pCO2 arterial: 39.9 mmHg (ref 32.0–48.0)
pH, Arterial: 7.428 (ref 7.350–7.450)
pO2, Arterial: 102 mmHg (ref 83.0–108.0)

## 2019-06-04 ENCOUNTER — Telehealth: Payer: Self-pay | Admitting: Critical Care Medicine

## 2019-06-05 ENCOUNTER — Other Ambulatory Visit: Payer: Self-pay

## 2019-06-05 ENCOUNTER — Ambulatory Visit (HOSPITAL_BASED_OUTPATIENT_CLINIC_OR_DEPARTMENT_OTHER): Payer: Medicaid Other | Admitting: Critical Care Medicine

## 2019-06-05 ENCOUNTER — Encounter: Payer: Self-pay | Admitting: Critical Care Medicine

## 2019-06-05 DIAGNOSIS — L400 Psoriasis vulgaris: Secondary | ICD-10-CM

## 2019-06-05 DIAGNOSIS — J432 Centrilobular emphysema: Secondary | ICD-10-CM | POA: Diagnosis not present

## 2019-06-05 MED ORDER — PREDNISONE 10 MG PO TABS: ORAL_TABLET | ORAL | 0 refills | Status: DC

## 2019-06-05 MED ORDER — MUCINEX 600 MG PO TB12: 600.0000 mg | ORAL_TABLET | Freq: Two times a day (BID) | ORAL | 2 refills | Status: DC

## 2019-06-05 MED ORDER — ALBUTEROL SULFATE HFA 108 (90 BASE) MCG/ACT IN AERS: 2.0000 | INHALATION_SPRAY | Freq: Four times a day (QID) | RESPIRATORY_TRACT | 1 refills | Status: DC | PRN

## 2019-06-05 MED ORDER — IPRATROPIUM-ALBUTEROL 0.5-2.5 (3) MG/3ML IN SOLN: 3.0000 mL | Freq: Four times a day (QID) | RESPIRATORY_TRACT | 0 refills | Status: DC | PRN

## 2019-06-05 MED FILL — IPRAT-ALBUT 0.5-3(2.5) MG/3: 0.5-2.5 (3) | 30 days supply | Qty: 360 | Fill #0

## 2019-06-05 MED FILL — $VENTOLIN HFA 18G INHALER: 108 (90 BAS | 25 days supply | Qty: 18 | Fill #0

## 2019-06-05 MED FILL — predniSONE 10 MG TABS: 10 | 5 days supply | Qty: 20 | Fill #0

## 2019-06-05 NOTE — Progress Notes (Signed)
Subjective:    Patient ID: Richard Fuentes, male    DOB: 05/28/1970, 49 y.o.   MRN: [id number] Virtual Visit via Video Note  I connected with@ on 06/05/19 at@ by a video enabled telemedicine application and verified that I am speaking with the correct person using two identifiers.   Consent:  I discussed the limitations, risks, security and privacy concerns of performing an evaluation and management service by video visit and the availability of in person appointments. I also discussed with the patient that there may be a patient responsible charge related to this service. The patient expressed understanding and agreed to proceed.  Location of patient: Patient is at home  Location of provider: I was in the office  Persons participating in the televisit with the patient.    No one else on the call   History of Present Illness: 49 y.o.M with Copd/ asthma with freq ED visits ref from PCP for pulmonary evaluation The patient notes since 2013 the onset of asthma type symptoms of the progressively gotten worse he is never seen a pulmonologist.  At that time he also had significant periodontal disease and a dental infection that was not addressed.  Patient has history of generalized anxiety disorder and obsessive-compulsive disorder and this prevents him from taking medications at times.  He is very fearful of any particular treatments and tries to minimize medications.  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  He was in the emergency room in February given a pulse dose of prednisone but did not seem to help.  He has been followed at [facility] however he is on no medications does not return to [facility] since prior to Covid onset.  He did himself develop Covid viral infection in January and since that time has had chronic fatigue.  The patient is on  Symbicort 2 inhalations twice daily through patient assistance and states despite this is not under optimal control.  He states he has difficulty with laying flat also difficulty with eating meals without becoming full early The patient has not had pulmonary functions as of now  05/01/2019 Patient seen in return follow-up for severe COPD with bullous emphysema.  CT scan of chest has been obtained and shows severe bullous emphysema in the apices but also centrilobular emphysema diffusely.  The patient states the Trelegy inhaler has been helpful however the patient continues to have difficulty with dyspnea on exertion.  He wakens at night with severe shortness of breath as well.  He no longer is smoking tobacco products.  Anxiety is a major issue for this patient and he states the antianxiety medication has been of some benefit at this time.  The patient's pulmonary functions have been obtained and show Gold stage D COPD with FEV1 less than 30% predicted diffusion capacity less than 30% predicted and severe residual volume air trapping  06/05/2019 This is a video visit and the patient returns in follow-up and does have an upcoming appointment at the [facility] transplant center and has received Medicaid for disability  Patient still has severe dyspnea with minimal exertion.  He has a sleep study pending for July.  He is now on multivitamins.  He complains of worsening plaque psoriasis is requesting dermatology referral.  He has had psoriasis for 14 years but it appears to be worsening more recently.  He has  a nonproductive cough has difficulty raising secretions at this time this appears to have worsened as well.  He maintains Trelegy daily and uses his nebulizer at least twice daily and does have a flutter valve in place   Shortness of Breath This is a chronic problem. The current episode started more than 1 year ago. The problem occurs constantly. The problem has been rapidly worsening. Associated symptoms  include chest pain, headaches, orthopnea, PND, sputum production and wheezing. Pertinent negatives include no ear pain, hemoptysis or rhinorrhea. The symptoms are aggravated by any activity, exercise, weather changes, fumes, odors, pollens, emotional upset and eating. Risk factors include smoking. He has tried beta agonist inhalers, oral steroids and steroid inhalers for the symptoms. His past medical history is significant for allergies, asthma, COPD and pneumonia. There is no history of CAD, DVT, a heart failure or PE.   Past Medical History:  Diagnosis Date  . Anxiety   . Asthma   . COPD (chronic obstructive pulmonary disease) (HCC)   . Environmental allergies   . Esophagitis   . Hx of migraines   . OCD (obsessive compulsive disorder)      History reviewed. No pertinent family history.   Social History   Socioeconomic History  . Marital status: Single    Spouse name: Not on file  . Number of children: Not on file  . Years of education: Not on file  . Highest education level: Not on file  Occupational History  . Not on file  Tobacco Use  . Smoking status: Former Smoker    Packs/day: 1.00    Types: Cigarettes    Quit date: 01/19/2018    Years since quitting: 1.3  . Smokeless tobacco: Never Used  Substance and Sexual Activity  . Alcohol use: No  . Drug use: No  . Sexual activity: Not on file  Other Topics Concern  . Not on file  Social History Narrative  . Not on file   Social Determinants of Health   Financial Resource Strain:   . Difficulty of Paying Living Expenses:   Food Insecurity:   . Worried About Programme researcher, broadcasting/film/video in the Last Year:   . Barista in the Last Year:   Transportation Needs:   . Freight forwarder (Medical):   Marland Kitchen Lack of Transportation (Non-Medical):   Physical Activity:   . Days of Exercise per Week:   . Minutes of Exercise per Session:   Stress:   . Feeling of Stress :   Social Connections:   . Frequency of Communication with  Friends and Family:   . Frequency of Social Gatherings with Friends and Family:   . Attends Religious Services:   . Active Member of Clubs or Organizations:   . Attends Banker Meetings:   Marland Kitchen Marital Status:   Intimate Partner Violence:   . Fear of Current or Ex-Partner:   . Emotionally Abused:   Marland Kitchen Physically Abused:   . Sexually Abused:      Allergies  Allergen Reactions  . Penicillins Other (See Comments)    Childhood allergy  Has patient had a PCN reaction causing immediate rash, facial/tongue/throat swelling, SOB or lightheadedness with hypotension: n/a Has patient had a PCN reaction causing severe rash involving mucus membranes or skin necrosis: n/a Has patient had a PCN reaction that required hospitalization: n/a Has patient had a PCN reaction occurring within the last 10 years: n/a If all of the above answers are "NO", then may  proceed with Cephalosporin use.      Outpatient Medications Prior to Visit  Medication Sig Dispense Refill  . Fluticasone-Umeclidin-Vilant (TRELEGY ELLIPTA) 200-62.5-25 MCG/INH AEPB Inhale 1 puff into the lungs daily. 60 each 3  . albuterol (VENTOLIN HFA) 108 (90 Base) MCG/ACT inhaler Inhale 2 puffs into the lungs every 6 (six) hours as needed for wheezing or shortness of breath. 19 g 1  . ipratropium-albuterol (DUONEB) 0.5-2.5 (3) MG/3ML SOLN Take 3 mLs by nebulization every 6 (six) hours as needed. 360 mL 0   No facility-administered medications prior to visit.      Review of Systems  HENT: Negative for ear pain and rhinorrhea.   Respiratory: Positive for sputum production, shortness of breath and wheezing. Negative for hemoptysis.   Cardiovascular: Positive for chest pain, orthopnea and PND.  Neurological: Positive for headaches.       Objective:   Physical Exam There were no vitals filed for this visit. This is a video visit there was no direct exam patient was in no distress when observed through the video system  CXR  reviewed 02/2019:  Hyperinflation and changes c/w centrilobular emphysema  CT Chest: 4/20 FINDINGS: Cardiovascular: Heart is normal in size. Trace anterior pericardial fluid.  No evidence of thoracic aortic aneurysm.  Mediastinum/Nodes: No suspicious mediastinal lymphadenopathy.  Visualized thyroid is unremarkable.  Lungs/Pleura: Moderate to severe centrilobular and paraseptal emphysematous changes, upper lung predominant.  No focal consolidation.  No suspicious pulmonary nodules.  No pleural effusion or pneumothorax.  Upper Abdomen: Visualized upper abdomen is grossly unremarkable.  Musculoskeletal: Visualized osseous structures are within normal limits.  IMPRESSION: No evidence of acute cardiopulmonary disease.  Emphysema (ICD10-J43.9).  PFTs 4/6:  Ref Range & Units 3 wk ago  FVC-Pre L 2.94   FVC-%Pred-Pre % 52   FVC-Post L 3.02   FVC-%Pred-Post % 54   FVC-%Change-Post % 2   FEV1-Pre L 1.12   FEV1-%Pred-Pre % 25   FEV1-Post L 1.24   FEV1-%Pred-Post % 28   FEV1-%Change-Post % 10   FEV6-Pre L 2.40   FEV6-%Pred-Pre % 44   FEV6-Post L 2.75   FEV6-%Pred-Post % 51   FEV6-%Change-Post % 14   Pre FEV1/FVC ratio % 38   FEV1FVC-%Pred-Pre % 49   Post FEV1/FVC ratio % 41   FEV1FVC-%Change-Post % 7   Pre FEV6/FVC Ratio % 82   FEV6FVC-%Pred-Pre % 84   Post FEV6/FVC ratio % 91   FEV6FVC-%Pred-Post % 94   FEV6FVC-%Change-Post % 11   FEF 25-75 Pre L/sec 0.36   FEF2575-%Pred-Pre % 9   FEF 25-75 Post L/sec 0.53   FEF2575-%Pred-Post % 14   FEF2575-%Change-Post % 49   RV L 5.62   RV % pred % 255   TLC L 8.43   TLC % pred % 111   DLCO unc ml/min/mmHg 10.64   DLCO unc % pred % 33   DL/VA ml/min/mmHg/L 2.71   DL/VA % pred % 61        Assessment & Plan:  I personally reviewed all images and lab data in the [facility] system as well as any outside material available during this office visit and agree with the  radiology impressions.   COPD with emphysema  (HCC)GOLD D  Gold stage D COPD  Note recent blood gas showed no hypercarbia no desaturation during the day at rest or with exertion would like to see what his nocturnal oxygenation is as well so a sleep study has been ordered  I will give pulse prednisone for  increased exacerbation and also the patient was instructed as to proper use of the flutter valve  Plaque psoriasis Due to plaque psoriasis I will refer the patient to dermatology now the patient has Medicaid   Diagnoses and all orders for this visit:  Centrilobular emphysema (HCC)  Plaque psoriasis  Other orders -     predniSONE (DELTASONE) 10 MG tablet; Take 4 tablets daily for 5 days then stop -     ipratropium-albuterol (DUONEB) 0.5-2.5 (3) MG/3ML SOLN; Take 3 mLs by nebulization every 6 (six) hours as needed. -     albuterol (VENTOLIN HFA) 108 (90 Base) MCG/ACT inhaler; Inhale 2 puffs into the lungs every 6 (six) hours as needed for wheezing or shortness of breath. -     guaiFENesin (MUCINEX) 600 MG 12 hr tablet; Take 1 tablet (600 mg total) by mouth 2 (two) times daily.     Follow Up Instructions: Patient knows a direct office exam will be made in end of July   I discussed the assessment and treatment plan with the patient. The patient was provided an opportunity to ask questions and all were answered. The patient agreed with the plan and demonstrated an understanding of the instructions.   The patient was advised to call back or seek an in-person evaluation if the symptoms worsen or if the condition fails to improve as anticipated.  I provided 30 minutes of non-face-to-face time during this encounter  including  median intraservice time , review of notes, labs, imaging, medications  and explaining diagnosis and management to the patient .    Shan Levans, MD

## 2019-06-05 NOTE — Assessment & Plan Note (Addendum)
Gold stage D COPD  Note recent blood gas showed no hypercarbia no desaturation during the day at rest or with exertion would like to see what his nocturnal oxygenation is as well so a sleep study has been ordered  I will give pulse prednisone for increased exacerbation and also the patient was instructed as to proper use of the flutter valve

## 2019-06-05 NOTE — Assessment & Plan Note (Signed)
Due to plaque psoriasis I will refer the patient to dermatology now the patient has Medicaid

## 2019-06-09 ENCOUNTER — Encounter (INDEPENDENT_AMBULATORY_CARE_PROVIDER_SITE_OTHER): Payer: Self-pay

## 2019-06-14 ENCOUNTER — Other Ambulatory Visit: Payer: Self-pay | Admitting: Critical Care Medicine

## 2019-06-14 DIAGNOSIS — L509 Urticaria, unspecified: Secondary | ICD-10-CM

## 2019-06-14 DIAGNOSIS — L400 Psoriasis vulgaris: Secondary | ICD-10-CM

## 2019-06-14 NOTE — Progress Notes (Signed)
Referrals

## 2019-06-19 ENCOUNTER — Telehealth: Payer: Self-pay

## 2019-06-19 NOTE — Telephone Encounter (Signed)
Pt states is not going to [place] & will wait to find another dermatologist at this time.

## 2019-06-22 NOTE — Telephone Encounter (Signed)
Spoke to patient. Pt. Stated he have an appt. With [facility] dermatologist.

## 2019-07-06 ENCOUNTER — Telehealth: Payer: Self-pay | Admitting: Nurse Practitioner

## 2019-07-06 DIAGNOSIS — R21 Rash and other nonspecific skin eruption: Secondary | ICD-10-CM | POA: Diagnosis not present

## 2019-07-06 DIAGNOSIS — L400 Psoriasis vulgaris: Secondary | ICD-10-CM | POA: Diagnosis not present

## 2019-07-06 NOTE — Telephone Encounter (Signed)
Pt was seen at a dermatologist and wants to know if  meds can be filled at office pharmacy   Dovonex cream  Calcipotriene solution  Triamcinolone acetonide cream / please advise

## 2019-07-06 NOTE — Telephone Encounter (Signed)
Please advise and refill as appropriate.

## 2019-07-10 DIAGNOSIS — Z20822 Contact with and (suspected) exposure to covid-19: Secondary | ICD-10-CM | POA: Diagnosis not present

## 2019-07-10 DIAGNOSIS — Z03818 Encounter for observation for suspected exposure to other biological agents ruled out: Secondary | ICD-10-CM | POA: Diagnosis not present

## 2019-07-10 MED ORDER — CALCIPOTRIENE 0.005 % EX CREA: TOPICAL_CREAM | Freq: Two times a day (BID) | CUTANEOUS | 0 refills | Status: DC

## 2019-07-10 NOTE — Telephone Encounter (Signed)
Dr. Delford Field sent for Richard Fuentes.   Richard Fuentes - the patient is seeking for one of these three or all three?

## 2019-07-10 NOTE — Telephone Encounter (Signed)
I will send Rx for the dovonex cream Can you confirm does the Derm MD want all three to be Rx,  If so what is the sig??

## 2019-07-10 NOTE — Addendum Note (Signed)
Addended by: Shan Levans E on: 07/10/2019 03:50 PM   Modules accepted: Orders

## 2019-07-10 NOTE — Telephone Encounter (Addendum)
Pt says dermatologist wrote prescriptions & printed & gave to pt. Pt went to [facility] but they were too expensive. DOVONEX 0.005% external cream (apply twice daily) cost $80 so pt did not get CALCIPOTRIENE solution (apply twice daily) noncovered so needs prior auth/ pt did not get TRIAMCINOLONE was filled at pharmacy for $3.00 so pt got that one & does not need that one filled. Pt asking if our pharmacy is cheaper so he can afford to get them filled.

## 2019-07-10 NOTE — Telephone Encounter (Signed)
Dovonex cream is covered by Medicaid. Triamcinolone is also covered.   Calcipotriene is not covered.   He does not currently have orders in Epic for these. Will route to his PCP to see if he will write for these.

## 2019-07-11 ENCOUNTER — Ambulatory Visit: Payer: Self-pay | Admitting: Nurse Practitioner

## 2019-07-11 MED FILL — DOVONEX 0.005% CREAM: 0.005 | 30 days supply | Qty: 60 | Fill #0

## 2019-07-11 NOTE — Telephone Encounter (Signed)
Copays will be the same d/t insurance.

## 2019-07-11 NOTE — Telephone Encounter (Signed)
Pt with H/O COPD and emphysema. States SOB, worsening past 3 days. Reports increased SOB with exertion "More than my usual" and mild sob at rest. Reports fever 100.0, taken 1 hour prior to call, "Feels like I'm burning up."  Reports severe dry cough, "Keeps me awake at night." States has been using nebs tx, inhalers more often, ineffective. Pt states was tested for covid yesterday, negative.  Pt sounds distressed, voice halting at times.  Advised ED, pt states [facility] will go to [facility] home. Made aware they may send him to ED. Verbalizes understanding.   Reason for Disposition . [1] Longstanding difficulty breathing AND [2] not responding to usual therapy  Answer Assessment - Initial Assessment Questions 1. RESPIRATORY STATUS: "Describe your breathing?" (e.g., wheezing, shortness of breath, unable to speak, severe coughing)     Wheezing 2. ONSET: "When did this breathing problem begin?"      3 days ago 3. PATTERN "Does the difficult breathing come and go, or has it been constant since it started?"      Constant, wheezing present 4. SEVERITY: "How bad is your breathing?" (e.g., mild, moderate, severe)    - MILD: No SOB at rest, mild SOB with walking, speaks normally in sentences, can lay down, no retractions, pulse < 100.    - MODERATE: SOB at rest, SOB with minimal exertion and prefers to sit, cannot lie down flat, speaks in phrases, mild retractions, audible wheezing, pulse 100-120.    - SEVERE: Very SOB at rest, speaks in single words, struggling to breathe, sitting hunched forward, retractions, pulse > 120      moderate 5. RECURRENT SYMPTOM: "Have you had difficulty breathing before?" If Yes, ask: "When was the last time?" and "What happened that time?"      Yes but worsening 6. CARDIAC HISTORY: "Do you have any history of heart disease?" (e.g., heart attack, angina, bypass surgery, angioplasty)      no 7. LUNG HISTORY: "Do you have any history of lung disease?"  (e.g., pulmonary embolus,  asthma, emphysema)    COPD emphysema 8. CAUSE: "What do you think is causing the breathing problem?"      Not sure 9. OTHER SYMPTOMS: "Do you have any other symptoms? (e.g., dizziness, runny nose, cough, chest pain, fever)     Dry cough, awake at night, 100.0  Protocols used: BREATHING DIFFICULTY-A-AH

## 2019-07-12 NOTE — Telephone Encounter (Signed)
Agree with advice given

## 2019-07-13 DIAGNOSIS — Z20822 Contact with and (suspected) exposure to covid-19: Secondary | ICD-10-CM | POA: Diagnosis not present

## 2019-07-16 DIAGNOSIS — H5203 Hypermetropia, bilateral: Secondary | ICD-10-CM | POA: Diagnosis not present

## 2019-07-16 DIAGNOSIS — H524 Presbyopia: Secondary | ICD-10-CM | POA: Diagnosis not present

## 2019-07-19 ENCOUNTER — Other Ambulatory Visit: Payer: Self-pay

## 2019-07-19 ENCOUNTER — Encounter (HOSPITAL_BASED_OUTPATIENT_CLINIC_OR_DEPARTMENT_OTHER): Payer: Self-pay

## 2019-07-19 ENCOUNTER — Ambulatory Visit (HOSPITAL_BASED_OUTPATIENT_CLINIC_OR_DEPARTMENT_OTHER): Payer: Medicaid Other

## 2019-07-24 ENCOUNTER — Other Ambulatory Visit: Payer: Self-pay

## 2019-07-24 ENCOUNTER — Encounter: Payer: Self-pay | Admitting: Allergy and Immunology

## 2019-07-24 ENCOUNTER — Ambulatory Visit (INDEPENDENT_AMBULATORY_CARE_PROVIDER_SITE_OTHER): Payer: Medicaid Other | Admitting: Allergy and Immunology

## 2019-07-24 VITALS — BP 110/70 | HR 95 | Temp 97.8°F | Resp 16 | Ht 72.0 in | Wt 192.0 lb

## 2019-07-24 DIAGNOSIS — J438 Other emphysema: Secondary | ICD-10-CM | POA: Diagnosis not present

## 2019-07-24 DIAGNOSIS — L500 Allergic urticaria: Secondary | ICD-10-CM

## 2019-07-24 DIAGNOSIS — Z91018 Allergy to other foods: Secondary | ICD-10-CM

## 2019-07-24 DIAGNOSIS — Z880 Allergy status to penicillin: Secondary | ICD-10-CM | POA: Diagnosis not present

## 2019-07-24 DIAGNOSIS — T7800XD Anaphylactic reaction due to unspecified food, subsequent encounter: Secondary | ICD-10-CM | POA: Diagnosis not present

## 2019-07-24 DIAGNOSIS — K2970 Gastritis, unspecified, without bleeding: Secondary | ICD-10-CM | POA: Diagnosis not present

## 2019-07-24 DIAGNOSIS — J3089 Other allergic rhinitis: Secondary | ICD-10-CM | POA: Diagnosis not present

## 2019-07-24 MED ORDER — FLUTICASONE PROPIONATE 50 MCG/ACT NA SUSP
2.0000 | Freq: Every day | NASAL | 5 refills | Status: DC | PRN
Start: 2019-07-24 — End: 2020-04-05

## 2019-07-24 MED ORDER — EPINEPHRINE 0.3 MG/0.3ML IJ SOAJ
0.3000 mg | Freq: Once | INTRAMUSCULAR | 1 refills | Status: AC
Start: 2019-07-24 — End: 2019-07-24

## 2019-07-24 MED ORDER — LEVOCETIRIZINE DIHYDROCHLORIDE 5 MG PO TABS: ORAL_TABLET | ORAL | 5 refills | Status: DC

## 2019-07-24 MED FILL — LEVOCETIRIZINE 5 MG TABLET: 5 | 34 days supply | Qty: 34 | Fill #0

## 2019-07-24 MED FILL — DOVONEX 0.005% CREAM: 0.005 | 30 days supply | Qty: 60 | Fill #0

## 2019-07-24 MED FILL — FLUTICASONE PROP 50 MCG SPR: 50 | 30 days supply | Qty: 16 | Fill #0

## 2019-07-24 MED FILL — EPINEPHRINE 0.3 MG AUTO-INJ: 0.3 | 1 days supply | Qty: 2 | Fill #0

## 2019-07-24 NOTE — Progress Notes (Signed)
New Patient Note  RE: Richard Fuentes MRN: [id number] DOB: 1970/04/17 Date of Office Visit: 07/24/2019  Referring provider: Gildardo Pounds, NP Primary care provider: Gildardo Pounds, NP  Chief Complaint: Breathing Problem, Allergic Rhinitis , Allergic Reaction, and Allergy Testing   History of present illness: Marquee Fuchs is a 49 y.o. male seen today in consultation requested by Geryl Rankins, NP.  He reports that several years ago he was diagnosed with asthma, COPD, and early emphysema, though he is now on stage IV emphysema with 24% lung capacity.  He reports that he had COVID-19 in January 2021 and "it really got bad and progressed quickly."  He is followed by his pulmonologist, Dr. Asencion Noble at [facility].  He is in the process of evaluation at [facility] for possible lung transplantation.  He quit smoking cigarettes in January 2020 and has a 70-pack-year history.  He is currently taking Trelegy Ellipta, albuterol as needed, and prednisone on occasion. He reports that he has difficulty taking medications because of anaphylactic reaction to penicillin when he was a child.  He states that the experiences caused him to have "medication paranoia."  He also reports that his OCD poses problems with regards to taking medications, making it "a process."  Therefore, he has avoided most medications since he was 49 years old.  He is interested in assessing his penicillin allergy status given his possible lung transplantation in the future. Over the past 30 years, Suyash has experienced recurrent episodes of hives. The hives have appeared at different times over his entire body.  The lesions are described as erythematous, raised, and pruritic.  Individual hives last less than 24 hours without leaving residual pigmentation or bruising. He has experienced associated angioedema of the lips, hands, and feet. He has not experienced unexpected weight loss, recurrent fevers or drenching night  sweats.  During one episode, the hives seem to be induced by pressure, but not during other episodes.  In addition, coming into contact with will seems to induce urticaria.  Otherwise, no specific medication, food, skin care product, detergent, soap, or other environmental triggers have been identified.Daviel has tried to control symptoms with diphenhydramine "when it gets really bad." Hehas not been evaluated and treated in the emergency department for these symptoms. Skin biopsy has not been performed.  His most recent episode of hives was approximately 2 years ago. Dick experiences nasal congestion, rhinorrhea, sneezing, postnasal drainage, nasal pruritus, and ocular pruritus.  The symptoms occur year-round but are most frequent and severe during the springtime and in the fall.  He typically does not treat the symptoms with medications.  Assessment and plan: COPD with emphysema (HCC)GOLD D   Continue current regimen of Trelegy Ellipta daily and albuterol DuoNeb as needed.  Follow-up with Dr. Asencion Noble as recommended.  Perennial and seasonal allergic rhinitis Environmental allergen skin tests were positive today to tree pollen, ragweed pollen, dust mite antigen, and mixed feathers.  Aeroallergen avoidance measures have been discussed and provided in written form.  A prescription has been provided for levocetirizine(Xyzal), 5 mg daily as needed.  A prescription has been provided for fluticasone nasal spray, 2 sprays per nostril daily as needed. Proper nasal spray technique has been discussed and demonstrated.  Nasal saline spray (i.e., Simply Saline) or nasal saline lavage (i.e., NeilMed) is recommended as needed and prior to medicated nasal sprays.  Recurrent urticaria Unclear etiology. Skin tests to select food allergens were negative today. NSAIDs exacerbate urticaria but are  not the underlying etiology in this case.  Emotional stress/anxiety may contribute to urticaria exacerbations.  At  one point, the urticaria was induced by pressure, however this is not the typical scenario for this patient.  We will rule out other potential etiologies with labs. For symptom relief, patient is to take oral antihistamines as directed.  The following labs have been ordered: FCeRI antibody, anti-thyroglobulin antibody, thyroid peroxidase antibody, tryptase, H. pylori serology, CBC, CMP, ESR, ANA, and alpha-gal panel.  The patient will be called with further recommendations after lab results have returned.  Instructions have been discussed and provided for H1/H2 receptor blockade with titration to find lowest effective dose.  Levocetirizine has been prescribed (as above).  Should there be a significant increase or change in symptoms, a journal is to be kept recording any foods eaten, beverages consumed, medications taken within a 6 hour period prior to the onset of symptoms, as well as record activities being performed, and environmental conditions. For any symptoms concerning for anaphylaxis, 911 is to be called immediately.  History of penicillin allergy The patient's history suggests penicillin allergy as a child.  Penicillin sensitivity tends to wane over the years.  He will return to the clinic for penicillin testing in the future.  History of food allergy Possible food allergy.  The patients history suggests seafood allergy, though todays skin tests were negative despite a positive histamine control.  Food allergen skin testing has excellent negative predictive value however there is still a 5% chance that the allergy exists.  Therefore, we will investigate further with serum specific IgE levels and, if negative, open graded oral challenge.  A laboratory order form has been provided for serum specific IgE against shellfish panel and a fish panel.  Until the food allergy has been definitively ruled out, the patient is to continue meticulous avoidance.   Meds ordered this encounter    Medications  . levocetirizine (XYZAL) 5 MG tablet    Sig: Take 1 tablet once daily as needed for runny nose or itchy eyes.    Dispense:  34 tablet    Refill:  5  . fluticasone (FLONASE) 50 MCG/ACT nasal spray    Sig: Place 2 sprays into both nostrils daily as needed for allergies or rhinitis.    Dispense:  18.2 mL    Refill:  5  . EPINEPHrine 0.3 mg/0.3 mL IJ SOAJ injection    Sig: Inject 0.3 mLs (0.3 mg total) into the muscle once for 1 dose.    Dispense:  2 each    Refill:  1    Diagnostics: Spirometry: Spirometry reveals an FVC of 2.69 L (50% predicted and FEV1 was 1.33 L (32% predicted) with 13% postbronchodilator improvement.  Please see scanned spirometry results for details. Environmental skin testing: Reactive to ragweed pollen, tree pollen, dust mite antigen, and cat hair. Food allergen skin testing: Negative despite a positive histamine control.    Physical examination: Blood pressure 110/70, pulse 95, temperature 97.8 F (36.6 C), temperature source Oral, resp. rate 16, height 6' (1.829 m), weight 192 lb (87.1 kg), SpO2 99 %.  General: Alert, interactive, in no acute distress. HEENT: TMs pearly gray, turbinates moderately edematous with thick discharge, post-pharynx erythematous. Neck: Supple without lymphadenopathy. Lungs: Decreased breath sounds with slightly expiratory wheezing bilaterally. CV: Normal S1, S2 without murmurs. Abdomen: Nondistended, nontender. Skin: Warm and dry, without lesions or rashes. Extremities:  No clubbing, cyanosis or edema. Neuro:   Grossly intact.  Review of systems:  Review of  systems negative except as noted in HPI / PMHx or noted below: Review of Systems  Constitutional: Negative.   HENT: Negative.   Eyes: Negative.   Respiratory: Negative.   Cardiovascular: Negative.   Gastrointestinal: Negative.   Genitourinary: Negative.   Musculoskeletal: Negative.   Skin: Negative.   Neurological: Negative.   Endo/Heme/Allergies:  Negative.   Psychiatric/Behavioral: Negative.     Past medical history:  Past Medical History:  Diagnosis Date  . Anxiety   . Asthma   . COPD (chronic obstructive pulmonary disease) ([place])   . Environmental allergies   . Esophagitis   . Hx of migraines   . OCD (obsessive compulsive disorder)   . Psoriasis   . Urticaria     Past surgical history:  Past Surgical History:  Procedure Laterality Date  . KNEE SURGERY      Family history: Family History  Problem Relation Age of Onset  . Multiple sclerosis Sister   . Psoriasis Maternal Grandmother   . Allergic rhinitis Neg Hx   . Angioedema Neg Hx   . Asthma Neg Hx   . Eczema Neg Hx   . Immunodeficiency Neg Hx   . Urticaria Neg Hx     Social history: Social History   Socioeconomic History  . Marital status: Single    Spouse name: Not on file  . Number of children: Not on file  . Years of education: Not on file  . Highest education level: Not on file  Occupational History  . Not on file  Tobacco Use  . Smoking status: Former Smoker    Packs/day: 1.00    Years: 30.00    Pack years: 30.00    Types: Cigarettes    Quit date: 01/19/2018    Years since quitting: 1.5  . Smokeless tobacco: Never Used  Vaping Use  . Vaping Use: Never used  Substance and Sexual Activity  . Alcohol use: No  . Drug use: No  . Sexual activity: Not on file  Other Topics Concern  . Not on file  Social History Narrative  . Not on file   Social Determinants of Health   Financial Resource Strain:   . Difficulty of Paying Living Expenses:   Food Insecurity:   . Worried About Charity fundraiser in the Last Year:   . Arboriculturist in the Last Year:   Transportation Needs:   . Film/video editor (Medical):   Marland Kitchen Lack of Transportation (Non-Medical):   Physical Activity:   . Days of Exercise per Week:   . Minutes of Exercise per Session:   Stress:   . Feeling of Stress :   Social Connections:   . Frequency of Communication with  Friends and Family:   . Frequency of Social Gatherings with Friends and Family:   . Attends Religious Services:   . Active Member of Clubs or Organizations:   . Attends Archivist Meetings:   Marland Kitchen Marital Status:   Intimate Partner Violence:   . Fear of Current or Ex-Partner:   . Emotionally Abused:   Marland Kitchen Physically Abused:   . Sexually Abused:     Environmental History: The patient lives in a 51 year old condominium with carpeting in the bedroom and central air/heat.  There is no known mold/water damage in the home.  There is a cat in the home which has access to his bedroom.  He is a former cigarette smoker having quit in January 2020 with a 70-pack-year history.  Current  Outpatient Medications  Medication Sig Dispense Refill  . albuterol (VENTOLIN HFA) 108 (90 Base) MCG/ACT inhaler Inhale 2 puffs into the lungs every 6 (six) hours as needed for wheezing or shortness of breath. 19 g 1  . Fluticasone-Umeclidin-Vilant (TRELEGY ELLIPTA) 200-62.5-25 MCG/INH AEPB Inhale 1 puff into the lungs daily. 60 each 3  . EPINEPHrine 0.3 mg/0.3 mL IJ SOAJ injection Inject 0.3 mLs (0.3 mg total) into the muscle once for 1 dose. 2 each 1  . fluticasone (FLONASE) 50 MCG/ACT nasal spray Place 2 sprays into both nostrils daily as needed for allergies or rhinitis. 18.2 mL 5  . levocetirizine (XYZAL) 5 MG tablet Take 1 tablet once daily as needed for runny nose or itchy eyes. 34 tablet 5   No current facility-administered medications for this visit.    Known medication allergies: Allergies  Allergen Reactions  . Penicillins Other (See Comments)    Childhood allergy  Has patient had a PCN reaction causing immediate rash, facial/tongue/throat swelling, SOB or lightheadedness with hypotension: n/a Has patient had a PCN reaction causing severe rash involving mucus membranes or skin necrosis: n/a Has patient had a PCN reaction that required hospitalization: n/a Has patient had a PCN reaction occurring  within the last 10 years: n/a If all of the above answers are "NO", then may proceed with Cephalosporin use.   . Shellfish Allergy     I appreciate the opportunity to take part in Hilliard's care. Please do not hesitate to contact me with questions.  Sincerely,   R. Edgar Frisk, MD

## 2019-07-24 NOTE — Assessment & Plan Note (Signed)
Environmental allergen skin tests were positive today to tree pollen, ragweed pollen, dust mite antigen, and mixed feathers.  Aeroallergen avoidance measures have been discussed and provided in written form.  A prescription has been provided for levocetirizine(Xyzal), 5 mg daily as needed.  A prescription has been provided for fluticasone nasal spray, 2 sprays per nostril daily as needed. Proper nasal spray technique has been discussed and demonstrated.  Nasal saline spray (i.e., Simply Saline) or nasal saline lavage (i.e., NeilMed) is recommended as needed and prior to medicated nasal sprays.

## 2019-07-24 NOTE — Assessment & Plan Note (Addendum)
Possible food allergy.  The patients history suggests seafood allergy, though todays skin tests were negative despite a positive histamine control.  Food allergen skin testing has excellent negative predictive value however there is still a 5% chance that the allergy exists.  Therefore, we will investigate further with serum specific IgE levels and, if negative, open graded oral challenge.  A laboratory order form has been provided for serum specific IgE against shellfish panel and a fish panel.  Until the food allergy has been definitively ruled out, the patient is to continue meticulous avoidance.

## 2019-07-24 NOTE — Patient Instructions (Addendum)
COPD with emphysema (HCC)GOLD D   Continue current regimen of Trelegy Ellipta daily and albuterol DuoNeb as needed.  Follow-up with Dr. Asencion Noble as recommended.  Perennial and seasonal allergic rhinitis Environmental allergen skin tests were positive today to tree pollen, ragweed pollen, dust mite antigen, and mixed feathers.  Aeroallergen avoidance measures have been discussed and provided in written form.  A prescription has been provided for levocetirizine(Xyzal), 5 mg daily as needed.  A prescription has been provided for fluticasone nasal spray, 2 sprays per nostril daily as needed. Proper nasal spray technique has been discussed and demonstrated.  Nasal saline spray (i.e., Simply Saline) or nasal saline lavage (i.e., NeilMed) is recommended as needed and prior to medicated nasal sprays.  Recurrent urticaria Unclear etiology. Skin tests to select food allergens were negative today. NSAIDs exacerbate urticaria but are not the underlying etiology in this case.  Emotional stress/anxiety may contribute to urticaria exacerbations.  At one point, the urticaria was induced by pressure, however this is not the typical scenario for this patient.  We will rule out other potential etiologies with labs. For symptom relief, patient is to take oral antihistamines as directed.  The following labs have been ordered: FCeRI antibody, anti-thyroglobulin antibody, thyroid peroxidase antibody, tryptase, H. pylori serology, CBC, CMP, ESR, ANA, and alpha-gal panel.  The patient will be called with further recommendations after lab results have returned.  Instructions have been discussed and provided for H1/H2 receptor blockade with titration to find lowest effective dose.  Levocetirizine has been prescribed (as above).  Should there be a significant increase or change in symptoms, a journal is to be kept recording any foods eaten, beverages consumed, medications taken within a 6 hour period prior to  the onset of symptoms, as well as record activities being performed, and environmental conditions. For any symptoms concerning for anaphylaxis, 911 is to be called immediately.  History of penicillin allergy The patient's history suggests penicillin allergy as a child.  Penicillin sensitivity tends to wane over the years.  He will return to the clinic for penicillin testing in the future.  History of food allergy Possible food allergy.  The patients history suggests seafood allergy, though todays skin tests were negative despite a positive histamine control.  Food allergen skin testing has excellent negative predictive value however there is still a 5% chance that the allergy exists.  Therefore, we will investigate further with serum specific IgE levels and, if negative, open graded oral challenge.  A laboratory order form has been provided for serum specific IgE against shellfish panel and a fish panel.  Until the food allergy has been definitively ruled out, the patient is to continue meticulous avoidance.   When lab results have returned you will be called with further recommendations. With the newly implemented Cures Act, the labs may be visible to you at the same time they become visible to [place]. However, the results will typically not be addressed until all of the results are back, so please be patient.  Until you have heard from [place], please continue the treatment plan as outlined on your take home sheet.  Urticaria (Hives)  . Levocetirizine (Xyzal) 5 mg twice a day and famotidine (Pepcid) 20 mg twice a day. If no symptoms for 7-14 days then decrease to. . Levocetirizine (Xyzal) 5 mg twice a day and famotidine (Pepcid) 20 mg once a day.  If no symptoms for 7-14 days then decrease to. . Levocetirizine (Xyzal) 5 mg twice a day.  If no  symptoms for 7-14 days then decrease to. . Levocetirizine (Xyzal) 5 mg once a day.  May use Benadryl (diphenhydramine) as needed for breakthrough  symptoms       If symptoms return, then step up dosage   Control of [place] dust mites play a major role in allergic asthma and rhinitis.  They occur in environments with high humidity wherever human skin, the food for dust mites is found. High levels have been detected in dust obtained from mattresses, pillows, carpets, upholstered furniture, bed covers, clothes and soft toys.  The principal allergen of the house dust mite is found in its feces.  A gram of dust may contain 1,000 mites and 250,000 fecal particles.  Mite antigen is easily measured in the air during house cleaning activities.    1. Encase mattresses, including the box spring, and pillow, in an air tight cover.  Seal the zipper end of the encased mattresses with wide adhesive tape. 2. Wash the bedding in water of 130 degrees Farenheit weekly.  Avoid cotton comforters/quilts and flannel bedding: the most ideal bed covering is the dacron comforter. 3. Remove all upholstered furniture from the bedroom. 4. Remove carpets, carpet padding, rugs, and non-washable window drapes from the bedroom.  Wash drapes weekly or use plastic window coverings. 5. Remove all non-washable stuffed toys from the bedroom.  Wash stuffed toys weekly. 6. Have the room cleaned frequently with a vacuum cleaner and a damp dust-mop.  The patient should not be in a room which is being cleaned and should wait 1 hour after cleaning before going into the room. 7. Close and seal all heating outlets in the bedroom.  Otherwise, the room will become filled with dust-laden air.  An electric heater can be used to heat the room. 8. Reduce indoor humidity to less than 50%.  Do not use a humidifier.  Reducing Pollen Exposure    1. Use nasal saline spray (i.e., Simply Saline) as needed. 2. Use eye lubricant drops (i.e., Natural Tears) as needed. 3. Do not hang sheets or clothing out to dry; pollen may collect on these items. 4. Do not mow lawns or  spend time around freshly cut grass; mowing stirs up pollen. 5. Keep windows closed at night.  Keep car windows closed while driving. 6. Minimize morning activities outdoors, a time when pollen counts are usually at their highest. 7. Stay indoors as much as possible when pollen counts or humidity is high and on windy days when pollen tends to remain in the air longer. 8. Use air conditioning when possible.  Many air conditioners have filters that trap the pollen spores. 9. Use a HEPA room air filter to remove pollen form the indoor air you breathe.

## 2019-07-24 NOTE — Assessment & Plan Note (Signed)
The patient's history suggests penicillin allergy as a child.  Penicillin sensitivity tends to wane over the years.  He will return to the clinic for penicillin testing in the future.

## 2019-07-24 NOTE — Assessment & Plan Note (Signed)
   Continue current regimen of Trelegy Ellipta daily and albuterol DuoNeb as needed.  Follow-up with Dr. Shan Levans as recommended.

## 2019-07-24 NOTE — Assessment & Plan Note (Addendum)
Unclear etiology. Skin tests to select food allergens were negative today. NSAIDs exacerbate urticaria but are not the underlying etiology in this case.  Emotional stress/anxiety may contribute to urticaria exacerbations.  At one point, the urticaria was induced by pressure, however this is not the typical scenario for this patient.  We will rule out other potential etiologies with labs. For symptom relief, patient is to take oral antihistamines as directed.  The following labs have been ordered: FCeRI antibody, anti-thyroglobulin antibody, thyroid peroxidase antibody, tryptase, H. pylori serology, CBC, CMP, ESR, ANA, and alpha-gal panel.  The patient will be called with further recommendations after lab results have returned.  Instructions have been discussed and provided for H1/H2 receptor blockade with titration to find lowest effective dose.  Levocetirizine has been prescribed (as above).  Should there be a significant increase or change in symptoms, a journal is to be kept recording any foods eaten, beverages consumed, medications taken within a 6 hour period prior to the onset of symptoms, as well as record activities being performed, and environmental conditions. For any symptoms concerning for anaphylaxis, 911 is to be called immediately.

## 2019-07-30 DIAGNOSIS — J432 Centrilobular emphysema: Secondary | ICD-10-CM | POA: Diagnosis not present

## 2019-07-30 DIAGNOSIS — Z7682 Awaiting organ transplant status: Secondary | ICD-10-CM | POA: Diagnosis not present

## 2019-07-30 DIAGNOSIS — F419 Anxiety disorder, unspecified: Secondary | ICD-10-CM | POA: Diagnosis not present

## 2019-07-30 DIAGNOSIS — J439 Emphysema, unspecified: Secondary | ICD-10-CM | POA: Diagnosis not present

## 2019-07-30 DIAGNOSIS — Z87891 Personal history of nicotine dependence: Secondary | ICD-10-CM | POA: Diagnosis not present

## 2019-07-30 DIAGNOSIS — R918 Other nonspecific abnormal finding of lung field: Secondary | ICD-10-CM | POA: Diagnosis not present

## 2019-07-30 DIAGNOSIS — Z01818 Encounter for other preprocedural examination: Secondary | ICD-10-CM | POA: Diagnosis not present

## 2019-07-31 ENCOUNTER — Other Ambulatory Visit: Payer: Self-pay

## 2019-07-31 ENCOUNTER — Telehealth: Payer: Self-pay

## 2019-07-31 ENCOUNTER — Encounter: Payer: Self-pay | Admitting: Critical Care Medicine

## 2019-07-31 ENCOUNTER — Ambulatory Visit: Payer: Medicaid Other | Attending: Critical Care Medicine | Admitting: Critical Care Medicine

## 2019-07-31 VITALS — BP 108/73 | HR 88 | Temp 98.1°F | Resp 18 | Ht 73.0 in | Wt 190.0 lb

## 2019-07-31 DIAGNOSIS — Z8616 Personal history of COVID-19: Secondary | ICD-10-CM | POA: Insufficient documentation

## 2019-07-31 DIAGNOSIS — F429 Obsessive-compulsive disorder, unspecified: Secondary | ICD-10-CM | POA: Diagnosis not present

## 2019-07-31 DIAGNOSIS — E7800 Pure hypercholesterolemia, unspecified: Secondary | ICD-10-CM | POA: Diagnosis not present

## 2019-07-31 DIAGNOSIS — F411 Generalized anxiety disorder: Secondary | ICD-10-CM | POA: Diagnosis not present

## 2019-07-31 DIAGNOSIS — Z87891 Personal history of nicotine dependence: Secondary | ICD-10-CM | POA: Insufficient documentation

## 2019-07-31 DIAGNOSIS — L400 Psoriasis vulgaris: Secondary | ICD-10-CM | POA: Insufficient documentation

## 2019-07-31 DIAGNOSIS — Z01818 Encounter for other preprocedural examination: Secondary | ICD-10-CM | POA: Diagnosis not present

## 2019-07-31 DIAGNOSIS — R072 Precordial pain: Secondary | ICD-10-CM | POA: Insufficient documentation

## 2019-07-31 DIAGNOSIS — J449 Chronic obstructive pulmonary disease, unspecified: Secondary | ICD-10-CM | POA: Diagnosis not present

## 2019-07-31 DIAGNOSIS — J432 Centrilobular emphysema: Secondary | ICD-10-CM | POA: Diagnosis not present

## 2019-07-31 DIAGNOSIS — F4310 Post-traumatic stress disorder, unspecified: Secondary | ICD-10-CM | POA: Diagnosis not present

## 2019-07-31 DIAGNOSIS — Z79899 Other long term (current) drug therapy: Secondary | ICD-10-CM | POA: Insufficient documentation

## 2019-07-31 DIAGNOSIS — F422 Mixed obsessional thoughts and acts: Secondary | ICD-10-CM | POA: Diagnosis not present

## 2019-07-31 DIAGNOSIS — Z880 Allergy status to penicillin: Secondary | ICD-10-CM | POA: Insufficient documentation

## 2019-07-31 MED ORDER — VITAMIN D (ERGOCALCIFEROL) 1.25 MG (50000 UNIT) PO CAPS: 50000.0000 [IU] | ORAL_CAPSULE | ORAL | 1 refills | Status: DC

## 2019-07-31 MED FILL — VIT D2 1.25 MG (50,000 UNIT: 1.25 MG | 84 days supply | Qty: 12 | Fill #0

## 2019-07-31 NOTE — Assessment & Plan Note (Signed)
Persistent plaque psoriasis and progressing  We will see if we can obtain for the patient Humira

## 2019-07-31 NOTE — Progress Notes (Signed)
Subjective:    Patient ID: Richard Fuentes, male    DOB: 14-Jul-1970, 49 y.o.   MRN: [id number]  History of Present Illness: 49 y.o.M with Copd/ asthma with freq ED visits ref from PCP for pulmonary evaluation The patient notes since 2013 the onset of asthma type symptoms of the progressively gotten worse he is never seen a pulmonologist.  At that time he also had significant periodontal disease and a dental infection that was not addressed.  Patient has history of generalized anxiety disorder and obsessive-compulsive disorder and this prevents him from taking medications at times.  He is very fearful of any particular treatments and tries to minimize medications.  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  He was in the emergency room in February given a pulse dose of prednisone but did not seem to help.  He has been followed at [facility] however he is on no medications does not return to [place] since prior to Covid onset.  He did himself develop Covid viral infection in January and since that time has had chronic fatigue.  The patient is on Symbicort 2 inhalations twice daily through patient assistance and states despite this is not under optimal control.  He states he has difficulty with laying flat also difficulty with eating meals without becoming full early The patient has not had pulmonary functions as of now  05/01/2019 Patient seen in return follow-up for severe COPD with bullous emphysema.  CT scan of chest has been obtained and shows severe bullous emphysema in the apices but also centrilobular emphysema diffusely.  The patient states the Trelegy inhaler has been helpful however the patient continues to have difficulty with dyspnea on exertion.  He wakens at night with severe shortness of breath as well.  He no longer is smoking tobacco  products.  Anxiety is a major issue for this patient and he states the antianxiety medication has been of some benefit at this time.  The patient's pulmonary functions have been obtained and show Gold stage D COPD with FEV1 less than 30% predicted diffusion capacity less than 30% predicted and severe residual volume air trapping  06/05/2019 This is a video visit and the patient returns in follow-up and does have an upcoming appointment at the [facility] transplant center and has received Medicaid for disability  Patient still has severe dyspnea with minimal exertion.  He has a sleep study pending for July.  He is now on multivitamins.  He complains of worsening plaque psoriasis is requesting dermatology referral.  He has had psoriasis for 14 years but it appears to be worsening more recently.  He has a nonproductive cough has difficulty raising secretions at this time this appears to have worsened as well.  He maintains Trelegy daily and uses his nebulizer at least twice daily and does have a flutter valve in place  07/31/2019 This is a face-to-face visit follow-up for severe COPD end-stage Gold stage D centrilobular emphysema.  The patient also has severe plaque psoriasis. Note this patient went to a dermatologist for the plaque psoriasis and it was felt that the patient would only benefit from topical therapy that he had tried these before and did not see improvement.  He states his psoriasis is progressing.  Note the patient is already been screened to the transplant clinic for tuberculosis hepatitis AB and C  all of which were negative  Patient also is in the transplant clinic being evaluated for potential transplant they wish to hold on this for now.  He is also being evaluated for potential lung volume reduction  Patient's blood gases in the clinic are unchanged recent chest x-ray is unchanged CT scan is pending  Patient states his dyspnea is so severe he cannot complete a shower and is requesting a  shower stool he also would like an alpha-1 antitrypsin level checked I thought we had checked this previously but no evidence of this is seen so an alpha-1 level needs to be checked   Patient states he is also requesting a cardiology referral as [facility] is requesting this as part of his pulmonary transplant eval  Shortness of Breath This is a chronic problem. The current episode started more than 1 year ago. The problem occurs constantly. The problem has been rapidly worsening. Associated symptoms include chest pain, headaches, orthopnea, PND, a rash, sputum production and wheezing. Pertinent negatives include no ear pain, hemoptysis or rhinorrhea. The symptoms are aggravated by any activity, exercise, weather changes, fumes, odors, pollens, emotional upset and eating. Risk factors include smoking. He has tried beta agonist inhalers, oral steroids and steroid inhalers for the symptoms. His past medical history is significant for allergies, asthma, COPD and pneumonia. There is no history of CAD, DVT, a heart failure or PE.   Past Medical History:  Diagnosis Date  . Anxiety   . Asthma   . COPD (chronic obstructive pulmonary disease) (HCC)   . Environmental allergies   . Esophagitis   . Hx of migraines   . OCD (obsessive compulsive disorder)   . Psoriasis   . Urticaria      Family History  Problem Relation Age of Onset  . Multiple sclerosis Sister   . Psoriasis Maternal Grandmother   . Allergic rhinitis Neg Hx   . Angioedema Neg Hx   . Asthma Neg Hx   . Eczema Neg Hx   . Immunodeficiency Neg Hx   . Urticaria Neg Hx      Social History   Socioeconomic History  . Marital status: Single    Spouse name: Not on file  . Number of children: Not on file  . Years of education: Not on file  . Highest education level: Not on file  Occupational History  . Not on file  Tobacco Use  . Smoking status: Former Smoker    Packs/day: 1.00    Years: 30.00    Pack years: 30.00    Types: Cigarettes     Quit date: 01/19/2018    Years since quitting: 1.5  . Smokeless tobacco: Never Used  Vaping Use  . Vaping Use: Never used  Substance and Sexual Activity  . Alcohol use: No  . Drug use: No  . Sexual activity: Not on file  Other Topics Concern  . Not on file  Social History Narrative  . Not on file   Social Determinants of Health   Financial Resource Strain:   . Difficulty of Paying Living Expenses:   Food Insecurity:   . Worried About Programme researcher, broadcasting/film/video in the Last Year:   . Barista in the Last Year:   Transportation Needs:   . Freight forwarder (Medical):   Marland Kitchen Lack of Transportation (Non-Medical):   Physical Activity:   . Days of Exercise per Week:   . Minutes of Exercise per Session:   Stress:   .  Feeling of Stress :   Social Connections:   . Frequency of Communication with Friends and Family:   . Frequency of Social Gatherings with Friends and Family:   . Attends Religious Services:   . Active Member of Clubs or Organizations:   . Attends Banker Meetings:   Marland Kitchen Marital Status:   Intimate Partner Violence:   . Fear of Current or Ex-Partner:   . Emotionally Abused:   Marland Kitchen Physically Abused:   . Sexually Abused:      Allergies  Allergen Reactions  . Penicillins Other (See Comments)    Childhood allergy  Has patient had a PCN reaction causing immediate rash, facial/tongue/throat swelling, SOB or lightheadedness with hypotension: n/a Has patient had a PCN reaction causing severe rash involving mucus membranes or skin necrosis: n/a Has patient had a PCN reaction that required hospitalization: n/a Has patient had a PCN reaction occurring within the last 10 years: n/a If all of the above answers are "NO", then may proceed with Cephalosporin use.   Marland Kitchen Shellfish Allergy      Outpatient Medications Prior to Visit  Medication Sig Dispense Refill  . albuterol (VENTOLIN HFA) 108 (90 Base) MCG/ACT inhaler Inhale 2 puffs into the lungs every 6 (six)  hours as needed for wheezing or shortness of breath. 19 g 1  . fluticasone (FLONASE) 50 MCG/ACT nasal spray Place 2 sprays into both nostrils daily as needed for allergies or rhinitis. 18.2 mL 5  . Fluticasone-Umeclidin-Vilant (TRELEGY ELLIPTA) 200-62.5-25 MCG/INH AEPB Inhale 1 puff into the lungs daily. 60 each 3  . levocetirizine (XYZAL) 5 MG tablet Take 1 tablet once daily as needed for runny nose or itchy eyes. 34 tablet 5   No facility-administered medications prior to visit.      Review of Systems  HENT: Negative for ear pain and rhinorrhea.   Respiratory: Positive for sputum production, shortness of breath and wheezing. Negative for hemoptysis.   Cardiovascular: Positive for chest pain, orthopnea and PND.  Skin: Positive for rash.  Neurological: Positive for headaches.       Objective:   Physical Exam Vitals:   07/31/19 1332  BP: 108/73  Pulse: 88  Resp: 18  Temp: 98.1 F (36.7 C)  SpO2: 97%  Weight: 190 lb (86.2 kg)  Height: 6\' 1"  (1.854 m)    Gen: Pleasant, well-nourished, in no distress, mild anxiety affect  ENT: No lesions,  mouth clear,  oropharynx clear, no postnasal drip  Neck: No JVD, no TMG, no carotid bruits  Lungs: No use of accessory muscles, no dullness to percussion, distant breath sounds hyperresonance to percussion Cardiovascular: RRR, heart sounds normal, no murmur or gallops, no peripheral edema  Abdomen: soft and NT, no HSM,  BS normal  Musculoskeletal: No deformities, no cyanosis or clubbing  Neuro: alert, non focal  Skin: Warm, diffuse plaque psoriasis seen on both dorsum of hands scalp and groin area    CXR reviewed 02/2019:  Hyperinflation and changes c/w centrilobular emphysema  CT Chest: 4/20 FINDINGS: Cardiovascular: Heart is normal in size. Trace anterior pericardial fluid.  No evidence of thoracic aortic aneurysm.  Mediastinum/Nodes: No suspicious mediastinal lymphadenopathy.  Visualized thyroid is  unremarkable.  Lungs/Pleura: Moderate to severe centrilobular and paraseptal emphysematous changes, upper lung predominant.  No focal consolidation.  No suspicious pulmonary nodules.  No pleural effusion or pneumothorax.  Upper Abdomen: Visualized upper abdomen is grossly unremarkable.  Musculoskeletal: Visualized osseous structures are within normal limits.  IMPRESSION: No evidence of acute cardiopulmonary  disease.  Emphysema (ICD10-J43.9).  PFTs 4/6:  Ref Range & Units 3 wk ago  FVC-Pre L 2.94   FVC-%Pred-Pre % 52   FVC-Post L 3.02   FVC-%Pred-Post % 54   FVC-%Change-Post % 2   FEV1-Pre L 1.12   FEV1-%Pred-Pre % 25   FEV1-Post L 1.24   FEV1-%Pred-Post % 28   FEV1-%Change-Post % 10   FEV6-Pre L 2.40   FEV6-%Pred-Pre % 44   FEV6-Post L 2.75   FEV6-%Pred-Post % 51   FEV6-%Change-Post % 14   Pre FEV1/FVC ratio % 38   FEV1FVC-%Pred-Pre % 49   Post FEV1/FVC ratio % 41   FEV1FVC-%Change-Post % 7   Pre FEV6/FVC Ratio % 82   FEV6FVC-%Pred-Pre % 84   Post FEV6/FVC ratio % 91   FEV6FVC-%Pred-Post % 94   FEV6FVC-%Change-Post % 11   FEF 25-75 Pre L/sec 0.36   FEF2575-%Pred-Pre % 9   FEF 25-75 Post L/sec 0.53   FEF2575-%Pred-Post % 14   FEF2575-%Change-Post % 49   RV L 5.62   RV % pred % 255   TLC L 8.43   TLC % pred % 111   DLCO unc ml/min/mmHg 10.64   DLCO unc % pred % 33   DL/VA ml/min/mmHg/L 1.612.71   DL/VA % pred % 61        Assessment & Plan:  I personally reviewed all images and lab [facility] as well as any outside material available during this office visit and agree with the  radiology impressions.   COPD with emphysema (HCC)GOLD D  Gold stage D COPD severe with bullous emphysema  Continue inhaled medications as prescribed  Follow-up with [facility] transplant clinic and testing  Referral to cardiology as a pretransplant evaluation  We will provide a shower stool  Obtain alpha-1 antitrypsin level  Plaque psoriasis Persistent  plaque psoriasis and progressing  We will see if we can obtain for the patient Humira   Diagnoses and all orders for this visit:  Centrilobular emphysema (HCC) -     For home use only DME Other see comment -     AMB referral to pulmonary rehabilitation -     Ambulatory referral to Cardiology -     Alpha-1-antitrypsin; Future -     Alpha-1-antitrypsin  Plaque psoriasis  Generalized anxiety disorder  Precordial pain -     Ambulatory referral to Cardiology  Pure hypercholesterolemia -     Ambulatory referral to Cardiology  Other orders -     Vitamin D, Ergocalciferol, (DRISDOL) 1.25 MG (50000 UNIT) CAPS capsule; Take 1 capsule (50,000 Units total) by mouth every 7 (seven) days.

## 2019-07-31 NOTE — Assessment & Plan Note (Signed)
Gold stage D COPD severe with bullous emphysema  Continue inhaled medications as prescribed  Follow-up with [facility] transplant clinic and testing  Referral to cardiology as a pretransplant evaluation  We will provide a shower stool  Obtain alpha-1 antitrypsin level

## 2019-07-31 NOTE — Telephone Encounter (Signed)
Order received for shower stool.  Call placed to patient to inquire if he has a preference for DME companies and he has not preference.informed him that the order will be sent to [employer] [facility] and they will verify insurance coverage and then contact him.   Order faxed to [employer] [facility]

## 2019-07-31 NOTE — Patient Instructions (Addendum)
We will check into humira for your psoriasis  Start Vitamin D one capsule weekly  No other medication changes   Cardiology referral will be made  Return 2 months dr Delford Field

## 2019-07-31 NOTE — Progress Notes (Signed)
Here to f/ on  COPD

## 2019-08-01 ENCOUNTER — Telehealth: Payer: Self-pay | Admitting: Nurse Practitioner

## 2019-08-01 DIAGNOSIS — J432 Centrilobular emphysema: Secondary | ICD-10-CM | POA: Diagnosis not present

## 2019-08-01 DIAGNOSIS — Z01818 Encounter for other preprocedural examination: Secondary | ICD-10-CM | POA: Insufficient documentation

## 2019-08-01 DIAGNOSIS — F419 Anxiety disorder, unspecified: Secondary | ICD-10-CM | POA: Insufficient documentation

## 2019-08-01 LAB — ALPHA-1-ANTITRYPSIN: A-1 Antitrypsin: 141 mg/dL (ref 101–187)

## 2019-08-01 NOTE — Progress Notes (Signed)
Patient ID: Richard Fuentes, male   DOB: Mar 31, 1970, 49 y.o.   MRN: [id number] Pt had more ?s on his [facility] labs: I went over each and every one of the labs with the patient and answered all his ?s

## 2019-08-01 NOTE — Telephone Encounter (Signed)
Please advise.  Copied from CRM [id number]. Topic: General - Inquiry >> Jul 31, 2019  5:24 PM Floria Raveling A wrote: Reason for CRM: pt called in and stated he would like to see if a nurse could call him back about his blood test.  He has some question about them and would like to take to nurse about  Best number -[phone]

## 2019-08-01 NOTE — Telephone Encounter (Signed)
Message routed to MD Wright/

## 2019-08-02 DIAGNOSIS — Z91018 Allergy to other foods: Secondary | ICD-10-CM | POA: Diagnosis not present

## 2019-08-02 DIAGNOSIS — T7800XD Anaphylactic reaction due to unspecified food, subsequent encounter: Secondary | ICD-10-CM | POA: Diagnosis not present

## 2019-08-02 DIAGNOSIS — L500 Allergic urticaria: Secondary | ICD-10-CM | POA: Diagnosis not present

## 2019-08-02 DIAGNOSIS — K2970 Gastritis, unspecified, without bleeding: Secondary | ICD-10-CM | POA: Diagnosis not present

## 2019-08-02 MED FILL — LEVOCETIRIZINE 5 MG TABLET: 5 | 34 days supply | Qty: 34 | Fill #0

## 2019-08-02 MED FILL — DOVONEX 0.005% CREAM: 0.005 | 30 days supply | Qty: 60 | Fill #0

## 2019-08-02 MED FILL — EPINEPHRINE 0.3 MG AUTO-INJ: 0.3 | 1 days supply | Qty: 2 | Fill #0

## 2019-08-02 MED FILL — FLUTICASONE PROP 50 MCG SPR: 50 | 30 days supply | Qty: 16 | Fill #0

## 2019-08-07 ENCOUNTER — Telehealth: Payer: Self-pay | Admitting: Critical Care Medicine

## 2019-08-07 ENCOUNTER — Telehealth: Payer: Self-pay | Admitting: *Deleted

## 2019-08-07 DIAGNOSIS — Z20822 Contact with and (suspected) exposure to covid-19: Secondary | ICD-10-CM | POA: Diagnosis not present

## 2019-08-07 DIAGNOSIS — Z87891 Personal history of nicotine dependence: Secondary | ICD-10-CM | POA: Diagnosis not present

## 2019-08-07 DIAGNOSIS — J432 Centrilobular emphysema: Secondary | ICD-10-CM | POA: Diagnosis not present

## 2019-08-07 DIAGNOSIS — R05 Cough: Secondary | ICD-10-CM | POA: Diagnosis not present

## 2019-08-07 DIAGNOSIS — Z01812 Encounter for preprocedural laboratory examination: Secondary | ICD-10-CM | POA: Diagnosis not present

## 2019-08-07 DIAGNOSIS — R0609 Other forms of dyspnea: Secondary | ICD-10-CM | POA: Diagnosis not present

## 2019-08-07 DIAGNOSIS — R262 Difficulty in walking, not elsewhere classified: Secondary | ICD-10-CM | POA: Diagnosis not present

## 2019-08-07 DIAGNOSIS — Z79899 Other long term (current) drug therapy: Secondary | ICD-10-CM | POA: Diagnosis not present

## 2019-08-07 NOTE — Telephone Encounter (Signed)
Patient just wanted to call and let us know that he is scheduled for the Zephyr valve on August 27th at [facility].

## 2019-08-07 NOTE — Telephone Encounter (Signed)
Noted. Thanks.

## 2019-08-07 NOTE — Telephone Encounter (Signed)
Pt called and wants Dr Delford Field to know just got back from [facility] with appt set up by him with Dr Carmie Kanner. Pt states 3-d CT scan and NM Lung Diff and they felt necessary to go ahead and set him up for a Zephyr valve on Aug 27th. Pt very appreciative to Dr Delford Field for all he has done.

## 2019-08-08 ENCOUNTER — Ambulatory Visit (HOSPITAL_BASED_OUTPATIENT_CLINIC_OR_DEPARTMENT_OTHER): Payer: Medicaid Other | Attending: Critical Care Medicine | Admitting: Internal Medicine

## 2019-08-08 ENCOUNTER — Other Ambulatory Visit: Payer: Self-pay

## 2019-08-08 DIAGNOSIS — R0683 Snoring: Secondary | ICD-10-CM | POA: Insufficient documentation

## 2019-08-08 DIAGNOSIS — G4733 Obstructive sleep apnea (adult) (pediatric): Secondary | ICD-10-CM | POA: Insufficient documentation

## 2019-08-08 DIAGNOSIS — J439 Emphysema, unspecified: Secondary | ICD-10-CM | POA: Insufficient documentation

## 2019-08-08 DIAGNOSIS — G4736 Sleep related hypoventilation in conditions classified elsewhere: Secondary | ICD-10-CM | POA: Diagnosis not present

## 2019-08-08 NOTE — Telephone Encounter (Signed)
I called the patient and encouraged him to f/u and get the valve procedure done

## 2019-08-09 ENCOUNTER — Telehealth: Payer: Self-pay

## 2019-08-09 LAB — ALPHA-GAL PANEL
Alpha Gal IgE*: <0.1 kU/L (ref <0.10)
Beef (Bos spp) IgE: <0.1 kU/L (ref <0.35)
Class Interpretation: 0
Class Interpretation: 0
Class Interpretation: 0
Lamb/Mutton (Ovis spp) IgE: <0.1 kU/L (ref <0.35)
Pork (Sus spp) IgE: <0.1 kU/L (ref <0.35)

## 2019-08-09 LAB — TRYPTASE: Tryptase: 7.8 ug/L (ref 2.2–13.2)

## 2019-08-09 NOTE — Telephone Encounter (Signed)
Call placed to [employer] [facility] to check on status of order for shower chair. Spoke to [place] who stated that the patient received the item 08/02/2019

## 2019-08-11 LAB — COMPREHENSIVE METABOLIC PANEL
ALT: 18 IU/L (ref 0–44)
AST: 20 IU/L (ref 0–40)
Albumin/Globulin Ratio: 1.5 (ref 1.2–2.2)
Albumin: 4.3 g/dL (ref 4.0–5.0)
Alkaline Phosphatase: 90 IU/L (ref 48–121)
BUN/Creatinine Ratio: 16 (ref 9–20)
BUN: 18 mg/dL (ref 6–24)
Bilirubin Total: 0.3 mg/dL (ref 0.0–1.2)
CO2: 26 mmol/L (ref 20–29)
Calcium: 8.9 mg/dL (ref 8.7–10.2)
Chloride: 102 mmol/L (ref 96–106)
Creatinine, Ser: 1.1 mg/dL (ref 0.76–1.27)
GFR calc Af Amer: 90 mL/min/{1.73_m2} (ref >59)
GFR calc non Af Amer: 78 mL/min/{1.73_m2} (ref >59)
Globulin, Total: 2.9 g/dL (ref 1.5–4.5)
Glucose: 94 mg/dL (ref 65–99)
Potassium: 4.2 mmol/L (ref 3.5–5.2)
Sodium: 139 mmol/L (ref 134–144)
Total Protein: 7.2 g/dL (ref 6.0–8.5)

## 2019-08-11 LAB — ALLERGEN PROFILE, FOOD-FISH
Allergen Mackerel IgE: <0.1 kU/L
Allergen Salmon IgE: <0.1 kU/L
Allergen Trout IgE: <0.1 kU/L
Allergen Walley Pike IgE: <0.1 kU/L
Codfish IgE: <0.1 kU/L
Halibut IgE: <0.1 kU/L
Tuna: <0.1 kU/L

## 2019-08-11 LAB — CBC WITH DIFFERENTIAL/PLATELET
Basophils Absolute: 0.1 10*3/uL (ref 0.0–0.2)
Basos: 1 %
EOS (ABSOLUTE): 0.1 10*3/uL (ref 0.0–0.4)
Eos: 1 %
Hematocrit: 42.6 % (ref 37.5–51.0)
Hemoglobin: 14.1 g/dL (ref 13.0–17.7)
Immature Grans (Abs): 0 10*3/uL (ref 0.0–0.1)
Immature Granulocytes: 0 %
Lymphocytes Absolute: 2.1 10*3/uL (ref 0.7–3.1)
Lymphs: 17 %
MCH: 30.2 pg (ref 26.6–33.0)
MCHC: 33.1 g/dL (ref 31.5–35.7)
MCV: 91 fL (ref 79–97)
Monocytes Absolute: 0.9 10*3/uL (ref 0.1–0.9)
Monocytes: 7 %
Neutrophils Absolute: 9.2 10*3/uL — ABNORMAL HIGH (ref 1.4–7.0)
Neutrophils: 74 %
Platelets: 272 10*3/uL (ref 150–450)
RBC: 4.67 x10E6/uL (ref 4.14–5.80)
RDW: 12.2 % (ref 11.6–15.4)
WBC: 12.3 10*3/uL — ABNORMAL HIGH (ref 3.4–10.8)

## 2019-08-11 LAB — ALLERGEN PROFILE, SHELLFISH
Clam IgE: <0.1 kU/L
F023-IgE Crab: <0.1 kU/L
F080-IgE Lobster: <0.1 kU/L
F290-IgE Oyster: <0.1 kU/L
Scallop IgE: <0.1 kU/L
Shrimp IgE: <0.1 kU/L

## 2019-08-11 LAB — SEDIMENTATION RATE: Sed Rate: 23 mm/hr (ref 0–30)

## 2019-08-11 LAB — H PYLORI, IGM, IGG, IGA AB
H pylori, IgM Abs: <9 units (ref 0.0–8.9)
H. pylori, IgA Abs: >35 units — ABNORMAL HIGH (ref 0.0–8.9)
H. pylori, IgG AbS: 0.16 Index Value (ref 0.00–0.79)

## 2019-08-11 LAB — ANA W/REFLEX IF POSITIVE: Anti Nuclear Antibody (ANA): NEGATIVE

## 2019-08-11 LAB — THYROID PEROXIDASE ANTIBODY: Thyroperoxidase Ab SerPl-aCnc: 8 IU/mL (ref 0–34)

## 2019-08-11 LAB — THYROGLOBULIN ANTIBODY: Thyroglobulin Antibody: <1 IU/mL (ref 0.0–0.9)

## 2019-08-11 LAB — CHRONIC URTICARIA: cu index: <4.6 (ref <10)

## 2019-08-16 ENCOUNTER — Other Ambulatory Visit: Payer: Self-pay | Admitting: Critical Care Medicine

## 2019-08-16 DIAGNOSIS — J432 Centrilobular emphysema: Secondary | ICD-10-CM

## 2019-08-17 ENCOUNTER — Telehealth (HOSPITAL_COMMUNITY): Payer: Self-pay

## 2019-08-17 NOTE — Telephone Encounter (Signed)
Called and spoke with Latoya with Medicaid who stated Pulmonary rehab is not covered under pt Medicaid plan. WLN#[id number]

## 2019-08-18 DIAGNOSIS — G4733 Obstructive sleep apnea (adult) (pediatric): Secondary | ICD-10-CM | POA: Diagnosis not present

## 2019-08-18 DIAGNOSIS — J439 Emphysema, unspecified: Secondary | ICD-10-CM

## 2019-08-18 DIAGNOSIS — G4736 Sleep related hypoventilation in conditions classified elsewhere: Secondary | ICD-10-CM | POA: Diagnosis not present

## 2019-08-18 NOTE — Procedures (Signed)
    Patient Name: Richard Fuentes, Richard Fuentes Date: 08/11/2019 Gender: Male D.O.B: 11-24-1970 Age (years): 48 Referring Provider: Shan Levans Height (inches): 73 Interpreting Physician: Jetty Duhamel MD, ABSM Weight (lbs): 185 RPSGT: Bowdon Sink BMI: 24 MRN: [id number] Neck Size: 16.00  CLINICAL INFORMATION Sleep Study Type: HST Indication for sleep study: OSA Epworth Sleepiness Score: 4  SLEEP STUDY TECHNIQUE A multi-channel overnight portable sleep study was performed. The channels recorded were: nasal airflow, thoracic respiratory movement, and oxygen saturation with a pulse oximetry. Snoring was also monitored.  MEDICATIONS Patient self administered medications include: none reported.  SLEEP ARCHITECTURE Patient was studied for 369.9 minutes. The sleep efficiency was 100.0 % and the patient was supine for 79.7%. The arousal index was 0.0 per hour.  RESPIRATORY PARAMETERS The overall AHI was 21.7 per hour, with a central apnea index of 0.0 per hour. The oxygen nadir was 82% during sleep.  CARDIAC DATA Mean heart rate during sleep was 68.6 bpm.  IMPRESSIONS - Moderate obstructive sleep apnea occurred during this study (AHI = 21.7/h). - No significant central sleep apnea occurred during this study (CAI = 0.0/h). - Oxygen desaturation was noted during this study (Min O2 = 82%). Mean sat 93%. - Patient snored.  DIAGNOSIS - Obstructive Sleep Apnea (G47.33)  RECOMMENDATIONS - Suggest CPAP titration sleep study or autopap. Otherr options would be based on clinical judgment. - Be careful with alcohol, sedatives and other CNS depressants that may worsen sleep apnea and disrupt normal sleep architecture. - Sleep hygiene should be reviewed to assess factors that may improve sleep quality. - Weight management and regular exercise should be initiated or continued.  [Electronically signed] 08/18/2019 11:02 AM  Jetty Duhamel MD, ABSM Diplomate, American Board of Sleep  Medicine   NPI: [id number]                         Jetty Duhamel Diplomate, American Board of Sleep Medicine  ELECTRONICALLY SIGNED ON:  08/18/2019, 10:59 AM  [facility] PH: [phone]   FX: [phone] ACCREDITED BY THE AMERICAN ACADEMY OF SLEEP MEDICINE

## 2019-08-20 ENCOUNTER — Telehealth (HOSPITAL_COMMUNITY): Payer: Self-pay | Admitting: *Deleted

## 2019-08-20 ENCOUNTER — Ambulatory Visit (INDEPENDENT_AMBULATORY_CARE_PROVIDER_SITE_OTHER): Payer: Medicaid Other | Admitting: Cardiology

## 2019-08-20 ENCOUNTER — Other Ambulatory Visit: Payer: Self-pay

## 2019-08-20 ENCOUNTER — Encounter: Payer: Self-pay | Admitting: Cardiology

## 2019-08-20 VITALS — BP 90/62 | HR 77 | Ht 73.0 in | Wt 191.0 lb

## 2019-08-20 DIAGNOSIS — Z8249 Family history of ischemic heart disease and other diseases of the circulatory system: Secondary | ICD-10-CM

## 2019-08-20 DIAGNOSIS — E785 Hyperlipidemia, unspecified: Secondary | ICD-10-CM | POA: Insufficient documentation

## 2019-08-20 DIAGNOSIS — Z01810 Encounter for preprocedural cardiovascular examination: Secondary | ICD-10-CM

## 2019-08-20 DIAGNOSIS — R079 Chest pain, unspecified: Secondary | ICD-10-CM | POA: Diagnosis not present

## 2019-08-20 DIAGNOSIS — J431 Panlobular emphysema: Secondary | ICD-10-CM | POA: Diagnosis not present

## 2019-08-20 MED ORDER — METOPROLOL TARTRATE 100 MG PO TABS
ORAL_TABLET | ORAL | 0 refills | Status: DC
Start: 2019-08-20 — End: 2019-10-11

## 2019-08-20 NOTE — Progress Notes (Signed)
Primary Care Provider: Claiborne Rigg, NP Cardiologist: No primary care provider on file.  Pulmonologist: Dr. Shan Levans Lung transplant team: [facility] Electrophysiologist: None  Clinic Note: No chief complaint on file.  HPI:    Richard Fuentes is a 49 y.o. male with a PMH notable for severe/end-stage COPD-bolus emphysema Gold stage D FEV1<30% who presents today for PRETRANSPLANT EVALUATION  & the evaluation of CHEST TIGHTNESS, RACING HEART, SHORTNESS OF BREATH and PULMONARY HYPERTENSION at the request of Claiborne Rigg, NP & Shan Levans, MD (Pulmonary Medicine)  Richard Fuentes was seen on July 31, 2019 by Dr. Delford Field, his note indicates that the patient had been treated for chronic asthma since 2013, but had not seen pulmonologist until this year.--> Fredy has been referred to [facility] transplant clinic, and actually has upcoming bronchoscopy with potential Zephyr valve placement for nonsurgical lung decompression of the upper lobes.  As part of his preoperative evaluation he was referred for cardiology evaluation.  Recent Hospitalizations:   ER visit February 24, 2019-had worsening dyspnea since being tested positive for COVID-19 on January 27, 2019.  There was concern for possible PE, but was determined the tachycardia was related to anxiety.  Was given nebulizers and told to follow-up with PCP.  Reviewed  CV studies:    The following studies were reviewed today: (if available, images/films reviewed: From Epic Chart or Care Everywhere) . CT chest April 2021: No acute cardiopulmonary disease.  Severe centrilobular and paraseptal emphysematous changes upper lung predominant. . Echocardiogram has been ordered by [facility] transplant team.  Interval History:   Richard Fuentes is here for cardiology evaluation.  He has severe COPD as well as anxiety which complicates issues.  He has significant dyspnea, and profound orthopnea unable to lie flat.  This is probably all related to COPD.  He is actually  not currently on oxygen.  When he does have attacks of dyspnea and wheezing, he will have profound tightness across his chest.  He is always chalked this up to tightness or wheezing, but is also a little bit concerned because of the pretty significant family history of premature CAD.  (His father had an MI in his early 53s, and his paternal grandfather died of an MI in his 66s while a paternal cousin had an MI at age 40.  His mother eventually had an MI later on in life, but several maternal uncles had premature CAD as well).  He has never had a cardiac evaluation.  He quit smoking about a year and a half ago, and has never had high blood pressure.  His lipids however not been well controlled. He is not very good at taking medications and other than his Trelegy and fluticasone would appear in albuterol, he does not take them any medications.  He feels his heart racing when he short of breath, but has not noted any prolonged tachycardia or irregular heartbeats.  Later on our conversation, he did indicate that he may have some chest discomfort with exertion, but it is really 1 is hard for him to breathe but he feels tightness that is diffuse across his chest.  CV Review of Symptoms (Summary) Cardiovascular ROS: positive for - chest pain, dyspnea on exertion, orthopnea, rapid heart rate, shortness of breath and He is always attributed mostly symptoms to COPD negative for - edema or Although he may get lightheaded okay on occasion, no syncope or near syncope.  No TIA amaurosis fugax.  The patient does not have symptoms concerning for COVID-19  infection (fever, chills, cough, or new shortness of breath).  He has chronic dyspnea with cough The patient is practicing social distancing & Masking.   Developed COVID-19 infection in January 21-has been troubled with chronic fatigue since then.  REVIEWED OF SYSTEMS   Review of Systems  Constitutional: Positive for malaise/fatigue. Negative for weight loss.    HENT: Positive for congestion. Negative for nosebleeds.   Respiratory: Positive for cough, shortness of breath and wheezing. Negative for sputum production (Not lately).   Cardiovascular: Negative for leg swelling.  Gastrointestinal: Negative for blood in stool and melena.  Genitourinary: Negative for hematuria.  Musculoskeletal: Positive for back pain and joint pain.  Neurological: Positive for dizziness. Negative for focal weakness.  Psychiatric/Behavioral: Negative for memory loss. The patient is nervous/anxious and has insomnia.        Documented OCD   I have reviewed and (if needed) personally updated the patient's problem list, medications, allergies, past medical and surgical history, social and family history.   PAST MEDICAL HISTORY   Past Medical History:  Diagnosis Date  . Anxiety   . Asthma   . COPD (chronic obstructive pulmonary disease) (HCC)    Severe centrilobular and paraseptal bullous emphysema; Gold stage D; FEV1 and DLCO<30%.;  Currently undergoing transplant evaluation  . Environmental allergies   . Esophagitis   . Hx of migraines   . OCD (obsessive compulsive disorder)   . Psoriasis   . Urticaria     PAST SURGICAL HISTORY   Past Surgical History:  Procedure Laterality Date  . KNEE SURGERY      MEDICATIONS/ALLERGIES   Current Meds  Medication Sig  . albuterol (VENTOLIN HFA) 108 (90 Base) MCG/ACT inhaler Inhale 2 puffs into the lungs every 6 (six) hours as needed for wheezing or shortness of breath.  . fluticasone (FLONASE) 50 MCG/ACT nasal spray Place 2 sprays into both nostrils daily as needed for allergies or rhinitis.  . Fluticasone-Umeclidin-Vilant (TRELEGY ELLIPTA) 200-62.5-25 MCG/INH AEPB Inhale 1 puff into the lungs daily.  Marland Kitchen levocetirizine (XYZAL) 5 MG tablet Take 1 tablet once daily as needed for runny nose or itchy eyes.  . Vitamin D, Ergocalciferol, (DRISDOL) 1.25 MG (50000 UNIT) CAPS capsule Take 1 capsule (50,000 Units total) by mouth  every 7 (seven) days.    Allergies  Allergen Reactions  . Penicillins Other (See Comments)    Childhood allergy  Has patient had a PCN reaction causing immediate rash, facial/tongue/throat swelling, SOB or lightheadedness with hypotension: n/a Has patient had a PCN reaction causing severe rash involving mucus membranes or skin necrosis: n/a Has patient had a PCN reaction that required hospitalization: n/a Has patient had a PCN reaction occurring within the last 10 years: n/a If all of the above answers are "NO", then may proceed with Cephalosporin use.   Marland Kitchen Shellfish Allergy     SOCIAL HISTORY/FAMILY HISTORY   Social History   Tobacco Use  . Smoking status: Former Smoker    Packs/day: 1.00    Years: 30.00    Pack years: 30.00    Types: Cigarettes    Quit date: 01/19/2018    Years since quitting: 1.5  . Smokeless tobacco: Never Used  Vaping Use  . Vaping Use: Never used  Substance Use Topics  . Alcohol use: No  . Drug use: No   Social History   Social History Narrative  . Not on file   family history includes CAD in his father and maternal uncle; Heart attack in his maternal uncle  and mother; Heart attack (age of onset: 9) in his cousin; Heart attack (age of onset: 44) in his paternal grandfather; Heart attack (age of onset: 49) in his father; Multiple sclerosis in his sister; Psoriasis in his maternal grandmother; Sudden Cardiac Death (age of onset: 16) in his cousin; Sudden Cardiac Death (age of onset: 52) in his paternal grandfather.   OBJCTIVE -PE, EKG, labs   Wt Readings from Last 3 Encounters:  08/20/19 191 lb (86.6 kg)  08/09/19 185 lb (83.9 kg)  07/31/19 190 lb (86.2 kg)    Physical Exam: BP 90/62   Pulse 77   Ht  (1.854 m)   Wt 191 lb (86.6 kg)   BMI 25.20 kg/m  Physical Exam Vitals reviewed.  Constitutional:      General: He is not in acute distress.    Appearance: He is obese. He is ill-appearing (Chronically ill-appearing).     Comments:  Well-groomed.  HENT:     Head: Normocephalic and atraumatic.  Neck:     Vascular: No carotid bruit.  Cardiovascular:     Rate and Rhythm: Normal rate and regular rhythm. Occasional extrasystoles are present.    Chest Wall: PMI is displaced (Low and midline).     Pulses: Normal pulses.     Heart sounds: S2 normal. Heart sounds are distant. No murmur heard.  No friction rub. No gallop.   Pulmonary:     Effort: Accessory muscle usage and prolonged expiration present. No respiratory distress.     Breath sounds: Decreased air movement present. Examination of the right-upper field reveals decreased breath sounds. Examination of the left-upper field reveals decreased breath sounds. Decreased breath sounds, wheezing (Bilaterally, throughout) and rhonchi present. No rales.  Musculoskeletal:        General: No swelling. Normal range of motion.     Cervical back: Normal range of motion.  Neurological:     General: No focal deficit present.     Mental Status: He is alert and oriented to person, place, and time.  Psychiatric:        Mood and Affect: Mood normal.        Behavior: Behavior normal.        Thought Content: Thought content normal.        Judgment: Judgment normal.     Comments: He is a little bit anxious and fidgety.      Adult ECG Report Not checked  Recent Labs:    Lab Results  Component Value Date   CHOL 240 (H) 12/07/2018   HDL 50 12/07/2018   LDLCALC 165 (H) 12/07/2018   TRIG 139 12/07/2018   CHOLHDL 4.8 12/07/2018   Lab Results  Component Value Date   CREATININE 1.10 08/02/2019   BUN 18 08/02/2019   NA 139 08/02/2019   K 4.2 08/02/2019   CL 102 08/02/2019   CO2 26 08/02/2019   Lab Results  Component Value Date   TSH 0.821 04/28/2010    ASSESSMENT/PLAN    Problem List Items Addressed This Visit    COPD with emphysema (HCC)GOLD D  (Chronic)    Gold D with severe bullous emphysema.  On inhaled therapy.  Undergoing transplant evaluation, but not yet  "severe enough "  As part of pretransplant evaluation and temporizing measures there are plans for Zephyr valve bronchial occlusion device is to be placed.  He has an echocardiogram ordered which will hopefully provide [place] information as to right-sided pressures. I do suppose that he will end up  getting a right left heart catheterization prior to transplant.  Not sure how long that would be, and he is having some chest discomfort now.  We will order a Coronary CT Angiogram to exclude ischemia at this point.      Family history of premature coronary artery disease (Chronic)    With exertional chest pain, severe hyperlipidemia and prior smoking history with this family history, we need to exclude ischemic CAD.  Plan: Coronary CTA      Preoperative cardiovascular examination - Primary (Chronic)    This is a difficult preop evaluation.  He clearly has a significant family history of CAD and he has tightness in his chest that is probably related to COPD.  Very difficult to tell.  I would clearly recommend 2D echocardiogram which is going to be done at the transplant center.  That makes sense. I suspect that once his transplant is pending, he would probably get a right and left heart cath, but at this point I do not have enough indication to do that at this point.  We do want to exclude severe CAD at this point however.  Plan:  Await results of echocardiogram to assess EF and possible right-sided pressures.  Ischemic evaluation with coronary CT angiogram (provided to get his heart rate down low enough -> we will give him 1-2 dose of metoprolol) --> other option would be nuclear stress test.  However this would not show evidence of nonobstructive CAD.  He has a shellfish allergy listed --> this does not translate to CT contrast hypersensitivity.  HE WILL NOT NEED PRETREATMENT      Exertional chest pain    Again, he is right that is probably COPD related, but with his family history of CAD at  roughly this age, we do need to exclude ischemic CAD.  Plan: Coronary CTA      Relevant Orders   Basic metabolic panel   CT CORONARY MORPH W/CTA COR W/SCORE W/CA W/CM &/OR WO/CM   CT CORONARY FRACTIONAL FLOW RESERVE DATA PREP   CT CORONARY FRACTIONAL FLOW RESERVE FLUID ANALYSIS   Hyperlipidemia with target LDL less than 100    LDL is 165.  With his family history of CAD and personal smoking history, we would at least like LDL to be less than.  Coronary CT angiogram with coronary calcium score will help provide supportive evidence for initiating treatment.      Relevant Medications   metoprolol tartrate (LOPRESSOR) 100 MG tablet       COVID-19 Education: The signs and symptoms of COVID-19 were discussed with the patient and how to seek care for testing (follow up with PCP or arrange E-visit).   The importance of social distancing and COVID-19 vaccination was discussed today.  I spent a total of with the patient. >  50% of the time was spent in direct patient consultation.  Additional time spent with chart review  / charting (studies, outside notes, etc): 18 Total Time: 44 min   Current medicines are reviewed at length with the patient today.  (+/- concerns) N/A  Notice: This dictation was prepared with Dragon dictation along with smaller phrase technology. Any transcriptional errors that result from this process are unintentional and may not be corrected upon review.  Patient Instructions / Medication Changes & Studies & Tests Ordered   Patient Instructions  Medication Instructions:  Take Metoprolol 100 mg 2 hours before CT when scheduled.   *If you need a refill on your cardiac medications before  your next appointment, please call your pharmacy*   Lab Work: BMET- 1 week before CT when scheduled.  No lab appointment needed.  If you have labs (blood work) drawn today and your tests are completely normal, you will receive your results only by: Marland Kitchen MyChart Message  (if you have MyChart) OR . A paper copy in the mail If you have any lab test that is abnormal or we need to change your treatment, we will call you to review the results.   Testing/Procedures: Your physician has requested that you have cardiac CT. Cardiac computed tomography (CT) is a painless test that uses an x-ray machine to take clear, detailed pictures of your heart. For further information please visit [email or web address] Please follow instruction sheet as given.   Follow-Up: At [facility], you and your health needs are our priority.  As part of our continuing mission to provide you with exceptional heart care, we have created designated Provider Care Teams.  These Care Teams include your primary Cardiologist (physician) and Advanced Practice Providers (APPs -  Physician Assistants and Nurse Practitioners) who all work together to provide you with the care you need, when you need it.  We recommend signing up for the patient portal called "MyChart".  Sign up information is provided on this After Visit Summary.  MyChart is used to connect with patients for Virtual Visits (Telemedicine).  Patients are able to view lab/test results, encounter notes, upcoming appointments, etc.  Non-urgent messages can be sent to your provider as well.   To learn more about what you can do with MyChart, go to [email or web address].    Your next appointment:   3 month(s)  The format for your next appointment:   In Person  Provider:   Bryan Lemma, MD  Studies Ordered:   Orders Placed This Encounter  Procedures  . CT CORONARY MORPH W/CTA COR W/SCORE W/CA W/CM &/OR WO/CM  . CT CORONARY FRACTIONAL FLOW RESERVE DATA PREP  . CT CORONARY FRACTIONAL FLOW RESERVE FLUID ANALYSIS  . Basic metabolic panel     Bryan Lemma, M.D., M.S. Interventional Cardiologist   Pager # [phone] Phone # [phone] [address] [place], [place] [address]   Thank you for choosing Heartcare at  [facility]!!

## 2019-08-20 NOTE — Patient Instructions (Signed)
Medication Instructions:  Take Metoprolol 100 mg 2 hours before CT when scheduled.   *If you need a refill on your cardiac medications before your next appointment, please call your pharmacy*   Lab Work: BMET- 1 week before CT when scheduled.  No lab appointment needed.  If you have labs (blood work) drawn today and your tests are completely normal, you will receive your results only by:  [facility] (if you have MyChart) OR  A paper copy in the mail If you have any lab test that is abnormal or we need to change your treatment, we will call you to review the results.   Testing/Procedures: Your physician has requested that you have cardiac CT. Cardiac computed tomography (CT) is a painless test that uses an x-ray machine to take clear, detailed pictures of your heart. For further information please visit [email or web address]. Please follow instruction sheet as given.   Follow-Up: At [facility] you and your health needs are our priority.  As part of our continuing mission to provide you with exceptional heart care, we have created designated Provider Care Teams.  These Care Teams include your primary Cardiologist (physician) and Advanced Practice Providers (APPs -  Physician Assistants and Nurse Practitioners) who all work together to provide you with the care you need, when you need it.  We recommend signing up for the patient portal called "MyChart".  Sign up information is provided on this After Visit Summary.  MyChart is used to connect with patients for Virtual Visits (Telemedicine).  Patients are able to view lab/test results, encounter notes, upcoming appointments, etc.  Non-urgent messages can be sent to your provider as well.   To learn more about what you can do with MyChart, go to [email or web address].    Your next appointment:   3 month(s)  The format for your next appointment:   In Person  Provider:   Glenetta Hew, MD   Other Instructions Your cardiac CT  will be scheduled at one of the below locations:   [facility] [address] [place], [place] [address] [phone]  [place] [address] [place], [place] [address] [phone]  If scheduled at [facility], please arrive at the [facility] main entrance of [facility] 30 minutes prior to test start time. Proceed to the [facility] (first floor) to check-in and test prep.  If scheduled at [facility], please arrive 15 mins early for check-in and test prep.  Please follow these instructions carefully (unless otherwise directed):  Hold all erectile dysfunction medications at least 3 days (72 hrs) prior to test.  On the Night Before the Test:  Be sure to Drink plenty of water.  Do not consume any caffeinated/decaffeinated beverages or chocolate 12 hours prior to your test.  Do not take any antihistamines 12 hours prior to your test.  On the Day of the Test:  Drink plenty of water. Do not drink any water within one hour of the test.  Do not eat any food 4 hours prior to the test.  You may take your regular medications prior to the test.   Take metoprolol (Lopressor) two hours prior to test.  HOLD Furosemide/Hydrochlorothiazide morning of the test.  FEMALES- please wear underwire-free bra if available      After the Test:  Drink plenty of water.  After receiving IV contrast, you may experience a mild flushed feeling. This is normal.  On occasion, you may experience a mild rash up to 24 hours after the test. This is not dangerous. If this occurs, you can take Benadryl 25 mg and increase your fluid intake.  If you experience trouble breathing, this can be serious. If it is severe call 911 IMMEDIATELY. If it is mild, please call our office.  If you take any of these medications: Glipizide/Metformin, Avandament, Glucavance, please do not  take 48 hours after completing test unless otherwise instructed.   Once we have confirmed authorization from your insurance company, we will call you to set up a date and time for your test. Based on how quickly your insurance processes prior authorizations requests, please allow up to 4 weeks to be contacted for scheduling your Cardiac CT appointment. Be advised that routine Cardiac CT appointments could be scheduled as many as 8 weeks after your provider has ordered it.  For non-scheduling related questions, please contact the cardiac imaging nurse navigator should you have any questions/concerns: Marchia Bond, Cardiac Imaging Nurse Navigator Burley Saver, Interim Cardiac Imaging Nurse [place] [facility] Direct Office Dial: [phone]   For scheduling needs, including cancellations and rescheduling, please call Vivien Rota at [phone], option 3.

## 2019-08-20 NOTE — Telephone Encounter (Signed)
Reviewed telephone note from support staff.  Called and informed pt that it is confirmed that Pulmonary rehab is not a covered benefit with his particular Medicaid plan. - Direct medicaid.  Pt verbalized understanding.  Pt is also aware that Dr. Delford Field is looking at resources available to community health and wellness pt offset much needed medical care.  Pt is on his way to Dr. Herbie Baltimore for cardiology consult. [name], BSN Cardiac and Emergency planning/management officer

## 2019-08-21 ENCOUNTER — Other Ambulatory Visit: Payer: Self-pay

## 2019-08-21 DIAGNOSIS — L500 Allergic urticaria: Secondary | ICD-10-CM

## 2019-08-21 MED FILL — METOPROLOL TARTRATE 100 MG: 100 | 1 days supply | Qty: 1 | Fill #0

## 2019-08-22 ENCOUNTER — Encounter: Payer: Self-pay | Admitting: Cardiology

## 2019-08-22 DIAGNOSIS — Z01810 Encounter for preprocedural cardiovascular examination: Secondary | ICD-10-CM | POA: Insufficient documentation

## 2019-08-22 NOTE — Assessment & Plan Note (Signed)
With exertional chest pain, severe hyperlipidemia and prior smoking history with this family history, we need to exclude ischemic CAD.  Plan: Coronary CTA

## 2019-08-22 NOTE — Assessment & Plan Note (Signed)
Gold D with severe bullous emphysema.  On inhaled therapy.  Undergoing transplant evaluation, but not yet "severe enough "  As part of pretransplant evaluation and temporizing measures there are plans for Zephyr valve bronchial occlusion device is to be placed.  He has an echocardiogram ordered which will hopefully provide [place] information as to right-sided pressures. I do suppose that he will end up getting a right left heart catheterization prior to transplant.  Not sure how long that would be, and he is having some chest discomfort now.  We will order a Coronary CT Angiogram to exclude ischemia at this point.

## 2019-08-22 NOTE — Assessment & Plan Note (Signed)
Again, he is right that is probably COPD related, but with his family history of CAD at roughly this age, we do need to exclude ischemic CAD.  Plan: Coronary CTA

## 2019-08-22 NOTE — Assessment & Plan Note (Addendum)
This is a difficult preop evaluation.  He clearly has a significant family history of CAD and he has tightness in his chest that is probably related to COPD.  Very difficult to tell.  I would clearly recommend 2D echocardiogram which is going to be done at the transplant center.  That makes sense. I suspect that once his transplant is pending, he would probably get a right and left heart cath, but at this point I do not have enough indication to do that at this point.  We do want to exclude severe CAD at this point however.  Plan:  Await results of echocardiogram to assess EF and possible right-sided pressures.  Ischemic evaluation with coronary CT angiogram (provided to get his heart rate down low enough -> we will give him 1-2 dose of metoprolol) --> other option would be nuclear stress test.  However this would not show evidence of nonobstructive CAD.  He has a shellfish allergy listed --> this does not translate to CT contrast hypersensitivity.  HE WILL NOT NEED PRETREATMENT

## 2019-08-22 NOTE — Assessment & Plan Note (Signed)
LDL is 165.  With his family history of CAD and personal smoking history, we would at least like LDL to be less than.  Coronary CT angiogram with coronary calcium score will help provide supportive evidence for initiating treatment.

## 2019-08-31 ENCOUNTER — Telehealth (HOSPITAL_COMMUNITY): Payer: Self-pay | Admitting: Emergency Medicine

## 2019-08-31 MED FILL — METOPROLOL TARTRATE 100 MG: 100 | 1 days supply | Qty: 1 | Fill #0

## 2019-08-31 NOTE — Telephone Encounter (Signed)
Reaching out to patient to offer assistance regarding upcoming cardiac imaging study; pt verbalizes understanding of appt date/time, parking situation and where to check in, pre-test NPO status and medications ordered, and verified current allergies; name and call back number provided for further questions should they arise Sima Lindenberger RN Navigator Cardiac Imaging [facility] [phone] office [phone] cell 

## 2019-09-03 ENCOUNTER — Other Ambulatory Visit: Payer: Self-pay

## 2019-09-03 ENCOUNTER — Other Ambulatory Visit: Payer: Self-pay | Admitting: Cardiology

## 2019-09-03 ENCOUNTER — Ambulatory Visit (HOSPITAL_COMMUNITY)
Admission: RE | Admit: 2019-09-03 | Discharge: 2019-09-03 | Disposition: A | Discharge status: Home / Self Care | Payer: Medicaid Other | Source: Ambulatory Visit | Attending: Cardiology | Admitting: Cardiology

## 2019-09-03 DIAGNOSIS — R079 Chest pain, unspecified: Secondary | ICD-10-CM

## 2019-09-03 DIAGNOSIS — J432 Centrilobular emphysema: Secondary | ICD-10-CM | POA: Diagnosis not present

## 2019-09-03 MED ORDER — NITROGLYCERIN 0.4 MG SL SUBL
0.8000 mg | SUBLINGUAL_TABLET | Freq: Once | SUBLINGUAL | Status: AC
  Administered 2019-09-03: 0.8 mg via SUBLINGUAL

## 2019-09-03 MED ORDER — SODIUM CHLORIDE 0.9 % IV SOLN: Freq: Once | INTRAVENOUS | Status: AC

## 2019-09-03 MED ORDER — NITROGLYCERIN 0.4 MG SL SUBL
SUBLINGUAL_TABLET | SUBLINGUAL | Status: AC
  Filled 2019-09-03: qty 2

## 2019-09-03 MED FILL — $VENTOLIN HFA 18G INHALER: 108 (90 BAS | 25 days supply | Qty: 18 | Fill #1

## 2019-09-03 MED FILL — $TRELEGY 200-62.5-25: 200-62.5-25 | 30 days supply | Qty: 60 | Fill #1

## 2019-09-05 ENCOUNTER — Other Ambulatory Visit: Payer: Self-pay | Admitting: Critical Care Medicine

## 2019-09-05 MED ORDER — TRELEGY ELLIPTA 200-62.5-25 MCG/INH IN AEPB: 1.0000 | INHALATION_SPRAY | Freq: Every day | RESPIRATORY_TRACT | 3 refills | Status: DC

## 2019-09-07 NOTE — Telephone Encounter (Signed)
Called pt unable to reach or leave message/  

## 2019-09-07 NOTE — Telephone Encounter (Signed)
Patient returned Richard Fuentes's call. Please call patient

## 2019-09-13 ENCOUNTER — Ambulatory Visit: Payer: Medicaid Other | Attending: Critical Care Medicine | Admitting: Critical Care Medicine

## 2019-09-13 ENCOUNTER — Ambulatory Visit: Payer: Medicaid Other | Admitting: Cardiovascular Disease

## 2019-09-13 ENCOUNTER — Encounter: Payer: Self-pay | Admitting: Critical Care Medicine

## 2019-09-13 DIAGNOSIS — G4733 Obstructive sleep apnea (adult) (pediatric): Secondary | ICD-10-CM

## 2019-09-13 DIAGNOSIS — L400 Psoriasis vulgaris: Secondary | ICD-10-CM | POA: Diagnosis not present

## 2019-09-13 DIAGNOSIS — J431 Panlobular emphysema: Secondary | ICD-10-CM

## 2019-09-13 NOTE — Assessment & Plan Note (Signed)
Severe pulmonary emphysema now has been seen by the lung transplant program at [facility] he is not ready for transplantation.  They are requesting pulmonary rehab.  We will try to get this covered locally as Medicaid will not cover.  There will be no change in the patient's plan inhaled medications

## 2019-09-13 NOTE — Telephone Encounter (Signed)
Pt concerns were addressed by MD Delford Field on today's visit /

## 2019-09-13 NOTE — Assessment & Plan Note (Signed)
Plaque psoriasis with lack of response to topical therapy  Patient wishing a second opinion

## 2019-09-13 NOTE — Assessment & Plan Note (Signed)
Sleep apnea demonstrated on recent sleep study with an AHI of 21.7 no central apnea desaturation to 82% the patient will begin CPAP therapy

## 2019-09-13 NOTE — Progress Notes (Signed)
Subjective:    Patient ID: Richard Fuentes, male    DOB: [birth year], 49 y.o.   MRN: [id number] Virtual Visit via Video Note  I connected with@ on 09/13/19 at@ by a video enabled telemedicine application and verified that I am speaking with the correct person using two identifiers.   Consent:  I discussed the limitations, risks, security and privacy concerns of performing an evaluation and management service by video visit and the availability of in person appointments. I also discussed with the patient that there may be a patient responsible charge related to this service. The patient expressed understanding and agreed to proceed.  Location of patient: Patient was at home  Location of provider: I was in my office  Persons participating in the televisit with the patient.   No one else on the call  History of Present Illness: First visit March 20, 2019 49 y.o.M with Copd/ asthma with freq ED visits ref from PCP for pulmonary evaluation The patient notes since 2013 the onset of asthma type symptoms of the progressively gotten worse he is never seen a pulmonologist.  At that time he also had significant periodontal disease and a dental infection that was not addressed.  Patient has history of generalized anxiety disorder and obsessive-compulsive disorder and this prevents him from taking medications at times.  He is very fearful of any particular treatments and tries to minimize medications.  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  He was in the emergency room in February given a pulse dose of prednisone but did not seem to help.  He has been followed at [facility] however he is on no medications does not return to [facility] since prior to Covid onset.  He did himself develop Covid viral infection in January and since that time has had chronic  fatigue.  The patient is on Symbicort 2 inhalations twice daily through patient assistance and states despite this is not under optimal control.  He states he has difficulty with laying flat also difficulty with eating meals without becoming full early The patient has not had pulmonary functions as of now  05/01/2019 Patient seen in return follow-up for severe COPD with bullous emphysema.  CT scan of chest has been obtained and shows severe bullous emphysema in the apices but also centrilobular emphysema diffusely.  The patient states the Trelegy inhaler has been helpful however the patient continues to have difficulty with dyspnea on exertion.  He wakens at night with severe shortness of breath as well.  He no longer is smoking tobacco products.  Anxiety is a major issue for this patient and he states the antianxiety medication has been of some benefit at this time.  The patient's pulmonary functions have been obtained and show Gold stage D COPD with FEV1 less than 30% predicted diffusion capacity less than 30% predicted and severe residual volume air trapping  06/05/2019 This is a video visit and the patient returns in follow-up and does have an upcoming [facility] and has received Medicaid for disability  Patient still has severe dyspnea with minimal exertion.  He has a sleep study pending for July.  He is now on multivitamins.  He complains of worsening plaque psoriasis is requesting dermatology referral.  He has had psoriasis for 14 years but it appears to be worsening more recently.  He has a nonproductive cough has difficulty raising secretions at this time this appears to have worsened as well.  He maintains Trelegy daily and uses his nebulizer at least twice daily and does have a flutter valve in place  07/31/2019 This is a face-to-face visit follow-up for severe COPD end-stage Gold stage D centrilobular emphysema.  The patient also has severe plaque  psoriasis. Note this patient went to a dermatologist for the plaque psoriasis and it was felt that the patient would only benefit from topical therapy that he had tried these before and did not see improvement.  He states his psoriasis is progressing.  Note the patient is already been screened to the transplant clinic for tuberculosis hepatitis AB and C all of which were negative  Patient also is in the transplant clinic being evaluated for potential transplant they wish to hold on this for now.  He is also being evaluated for potential lung volume reduction  Patient's blood gases in the clinic are unchanged recent chest x-ray is unchanged CT scan is pending  Patient states his dyspnea is so severe he cannot complete a shower and is requesting a shower stool he also would like an alpha-1 antitrypsin level checked I thought we had checked this previously but no evidence of this is seen so an alpha-1 level needs to be checked   Patient states he is also requesting a cardiology referral as [facility] is requesting this as part of his pulmonary transplant eval  09/13/2019 Video visit The patient wishes to follow-up to discuss several issues #1 cost of covering pulmonary rehab #2 concerns over psoriasis wishing a second opinion #3 needing CPAP machine now that he has Medicaid #4 cost of placing pulmonary valve treatments and the lack of Medicaid coverage  Patient went through mediation and it was determined by Medicaid his pulmonary valves would not be covered therefore he is not having this done.  The patient needs financial assistance for pulmonary rehab or attempting to identify funds for this.  Patient now has Medicaid is wishing a CPAP machine.  Patient complains of lack of progress with his dermatologist which is a second opinion.  There is been no real change in the patient's level of dyspnea.  His transplant of his lung needs to be put off for a period of time.  Shortness of Breath This is a chronic  problem. The current episode started more than 1 year ago. The problem occurs constantly. The problem has been rapidly worsening. Associated symptoms include chest pain, headaches, orthopnea, PND, a rash, sputum production and wheezing. Pertinent negatives include no ear pain, hemoptysis or rhinorrhea. The symptoms are aggravated by any activity, exercise, weather changes, fumes, odors, pollens, emotional upset and eating. Risk factors include smoking. He has tried beta agonist inhalers, oral steroids and steroid inhalers for the symptoms. His past medical history is significant for allergies, asthma, COPD and pneumonia. There is no history of CAD, DVT, a heart failure or PE.   Past Medical History:  Diagnosis Date  . Anxiety   . Asthma   . COPD (chronic obstructive pulmonary disease) (HCC)    Severe centrilobular and paraseptal bullous emphysema; Gold stage D; FEV1 and DLCO<30%.;  Currently undergoing transplant evaluation  . Environmental allergies   . Esophagitis   . Hx of migraines   . OCD (obsessive compulsive disorder)   . Psoriasis   . Urticaria      Family History  Problem Relation Age of Onset  . Multiple sclerosis Sister   .  Psoriasis Maternal Grandmother   . Heart attack Mother        Late in life  . Heart attack Father 69  . CAD Father   . Heart attack Paternal Grandfather 30       Sudden cardiac death  . Sudden Cardiac Death Paternal Grandfather 54  . Heart attack Maternal Uncle   . CAD Maternal Uncle   . Heart attack Cousin 32  . Sudden Cardiac Death Cousin 39  . Allergic rhinitis Neg Hx   . Angioedema Neg Hx   . Asthma Neg Hx   . Eczema Neg Hx   . Immunodeficiency Neg Hx   . Urticaria Neg Hx      Social History   Socioeconomic History  . Marital status: Single    Spouse name: Not on file  . Number of children: Not on file  . Years of education: Not on file  . Highest education level: Not on file  Occupational History  . Not on file  Tobacco Use  .  Smoking status: Former Smoker    Packs/day: 1.00    Years: 30.00    Pack years: 30.00    Types: Cigarettes    Quit date: 01/19/2018    Years since quitting: 1.6  . Smokeless tobacco: Never Used  Vaping Use  . Vaping Use: Never used  Substance and Sexual Activity  . Alcohol use: No  . Drug use: No  . Sexual activity: Not Currently  Other Topics Concern  . Not on file  Social History Narrative  . Not on file   Social Determinants of Health   Financial Resource Strain:   . Difficulty of Paying Living Expenses: Not on file  Food Insecurity:   . Worried About Programme researcher, broadcasting/film/video in the Last Year: Not on file  . Ran Out of Food in the Last Year: Not on file  Transportation Needs:   . Lack of Transportation (Medical): Not on file  . Lack of Transportation (Non-Medical): Not on file  Physical Activity:   . Days of Exercise per Week: Not on file  . Minutes of Exercise per Session: Not on file  Stress:   . Feeling of Stress : Not on file  Social Connections:   . Frequency of Communication with Friends and Family: Not on file  . Frequency of Social Gatherings with Friends and Family: Not on file  . Attends Religious Services: Not on file  . Active Member of Clubs or Organizations: Not on file  . Attends Banker Meetings: Not on file  . Marital Status: Not on file  Intimate Partner Violence:   . Fear of Current or Ex-Partner: Not on file  . Emotionally Abused: Not on file  . Physically Abused: Not on file  . Sexually Abused: Not on file     Allergies  Allergen Reactions  . Other Anaphylaxis    Wool  . Penicillins Other (See Comments)    Childhood allergy  Has patient had a PCN reaction causing immediate rash, facial/tongue/throat swelling, SOB or lightheadedness with hypotension: n/a Has patient had a PCN reaction causing severe rash involving mucus membranes or skin necrosis: n/a Has patient had a PCN reaction that required hospitalization: n/a Has patient  had a PCN reaction occurring within the last 10 years: n/a If all of the above answers are "NO", then may proceed with Cephalosporin use.   Marland Kitchen Shellfish Allergy      Outpatient Medications Prior to Visit  Medication Sig  Dispense Refill  . albuterol (VENTOLIN HFA) 108 (90 Base) MCG/ACT inhaler Inhale 2 puffs into the lungs every 6 (six) hours as needed for wheezing or shortness of breath. 19 g 1  . fluticasone (FLONASE) 50 MCG/ACT nasal spray Place 2 sprays into both nostrils daily as needed for allergies or rhinitis. 18.2 mL 5  . Fluticasone-Umeclidin-Vilant (TRELEGY ELLIPTA) 200-62.5-25 MCG/INH AEPB Inhale 1 puff into the lungs daily. 60 each 3  . ipratropium-albuterol (DUONEB) 0.5-2.5 (3) MG/3ML SOLN Inhale into the lungs.    Marland Kitchen levocetirizine (XYZAL) 5 MG tablet Take 1 tablet once daily as needed for runny nose or itchy eyes. 34 tablet 5  . Vitamin D, Ergocalciferol, (DRISDOL) 1.25 MG (50000 UNIT) CAPS capsule Take 1 capsule (50,000 Units total) by mouth every 7 (seven) days. 12 capsule 1  . metoprolol tartrate (LOPRESSOR) 100 MG tablet Take 1 tablet by mouth once for procedure. (Patient not taking: Reported on 09/13/2019) 1 tablet 0   No facility-administered medications prior to visit.      Review of Systems  HENT: Negative for ear pain and rhinorrhea.   Respiratory: Positive for sputum production, shortness of breath and wheezing. Negative for hemoptysis.   Cardiovascular: Positive for chest pain, orthopnea and PND.  Skin: Positive for rash.  Neurological: Positive for headaches.       Objective:   Physical Exam There were no vitals filed for this visit. No exam this is a video visit the patient was in no distress on the video CXR reviewed 02/2019:  Hyperinflation and changes c/w centrilobular emphysema  Sleep study 08/2019 IMPRESSIONS - Moderate obstructive sleep apnea occurred during this study (AHI = 21.7/h). - No significant central sleep apnea occurred during this study  (CAI = 0.0/h). - Oxygen desaturation was noted during this study (Min O2 = 82%). Mean sat 93%. - Patient snored.  DIAGNOSIS - Obstructive Sleep Apnea (G47.33)  RECOMMENDATIONS - Suggest CPAP titration sleep study or autopap. Otherr options would be based on clinical judgment. - Be careful with alcohol, sedatives and other CNS depressants that may worsen sleep apnea and disrupt normal sleep architecture. - Sleep hygiene should be reviewed to assess factors that may improve sleep quality. - Weight management and regular exercise should be initiated or continued.  CT Chest: 4/20 FINDINGS: Cardiovascular: Heart is normal in size. Trace anterior pericardial fluid.  No evidence of thoracic aortic aneurysm.  Mediastinum/Nodes: No suspicious mediastinal lymphadenopathy.  Visualized thyroid is unremarkable.  Lungs/Pleura: Moderate to severe centrilobular and paraseptal emphysematous changes, upper lung predominant.  No focal consolidation.  No suspicious pulmonary nodules.  No pleural effusion or pneumothorax.  Upper Abdomen: Visualized upper abdomen is grossly unremarkable.  Musculoskeletal: Visualized osseous structures are within normal limits.  IMPRESSION: No evidence of acute cardiopulmonary disease.  Emphysema (ICD10-J43.9).  PFTs 4/6:  Ref Range & Units 3 wk ago  FVC-Pre L 2.94   FVC-%Pred-Pre % 52   FVC-Post L 3.02   FVC-%Pred-Post % 54   FVC-%Change-Post % 2   FEV1-Pre L 1.12   FEV1-%Pred-Pre % 25   FEV1-Post L 1.24   FEV1-%Pred-Post % 28   FEV1-%Change-Post % 10   FEV6-Pre L 2.40   FEV6-%Pred-Pre % 44   FEV6-Post L 2.75   FEV6-%Pred-Post % 51   FEV6-%Change-Post % 14   Pre FEV1/FVC ratio % 38   FEV1FVC-%Pred-Pre % 49   Post FEV1/FVC ratio % 41   FEV1FVC-%Change-Post % 7   Pre FEV6/FVC Ratio % 82   FEV6FVC-%Pred-Pre % 84  Post FEV6/FVC ratio % 91   FEV6FVC-%Pred-Post % 94   FEV6FVC-%Change-Post % 11   FEF 25-75 Pre L/sec 0.36    FEF2575-%Pred-Pre % 9   FEF 25-75 Post L/sec 0.53   FEF2575-%Pred-Post % 14   FEF2575-%Change-Post % 49   RV L 5.62   RV % pred % 255   TLC L 8.43   TLC % pred % 111   DLCO unc ml/min/mmHg 10.64   DLCO unc % pred % 33   DL/VA ml/min/mmHg/L 9.98   DL/VA % pred % 61        Assessment & Plan:  I personally reviewed all images and lab data in the [facility] system as well as any outside material available during this office visit and agree with the  radiology impressions.   COPD with emphysema (HCC)GOLD D  Severe pulmonary emphysema now has been seen by the lung transplant program at [facility] he is not ready for transplantation.  They are requesting pulmonary rehab.  We will try to get this covered locally as Medicaid will not cover.  There will be no change in the patient's plan inhaled medications  OSA (obstructive sleep apnea) Sleep apnea demonstrated on recent sleep study with an AHI of 21.7 no central apnea desaturation to 82% the patient will begin CPAP therapy  Plaque psoriasis Plaque psoriasis with lack of response to topical therapy  Patient wishing a second opinion   Diagnoses and all orders for this visit:  Panlobular emphysema (HCC)  OSA (obstructive sleep apnea) -     For home use only DME continuous positive airway pressure (CPAP)  Plaque psoriasis -     Ambulatory referral to Dermatology      Follow Up Instructions: Patient knows a follow-up office visit will be obtained   I discussed the assessment and treatment plan with the patient. The patient was provided an opportunity to ask questions and all were answered. The patient agreed with the plan and demonstrated an understanding of the instructions.   The patient was advised to call back or seek an in-person evaluation if the symptoms worsen or if the condition fails to improve as anticipated.  I provided 30 minutes of non-face-to-face time during this encounter  including  median intraservice time , review of  notes, labs, imaging, medications  and explaining diagnosis and management to the patient .    Shan Levans, MD

## 2019-09-13 NOTE — Telephone Encounter (Signed)
Addressed today by MD Delford Field

## 2019-09-14 MED FILL — $VENTOLIN HFA 18G INHALER: 108 (90 BAS | 25 days supply | Qty: 18 | Fill #1

## 2019-09-14 MED FILL — $TRELEGY 200-62.5-25: 200-62.5-25 | 30 days supply | Qty: 60 | Fill #0

## 2019-09-21 MED FILL — $VENTOLIN HFA 18G INHALER: 108 (90 BAS | 25 days supply | Qty: 18 | Fill #1

## 2019-09-21 MED FILL — $TRELEGY 200-62.5-25: 200-62.5-25 | 120 days supply | Qty: 240 | Fill #0

## 2019-09-24 ENCOUNTER — Telehealth (HOSPITAL_COMMUNITY): Payer: Self-pay | Admitting: *Deleted

## 2019-09-24 NOTE — Telephone Encounter (Signed)
Received approval from community health and wellness charity fund for this pt to participate in pulmonary rehab.  Dollar amount is up to 1,000.  Called and spoke to Kenneth who was aware that efforts were being made on his behalf to participate in pulmonary rehab prior to lung transplant.  Pt scheduled for walk test on 10/7 at 2:30 and exercise on 10/12 at 11:00.  Pt will complete walk test and 6 exercise sessions in our pulmonary rehab undergraduate program.  Pt will then transfer to pulmonary rehab maintenance self pay program.  Pt stated that he could afford the monthly cost.  Will mail appointment information to confirmed address.  [name], BSN Cardiac and Emergency planning/management officer

## 2019-09-26 ENCOUNTER — Telehealth: Payer: Self-pay | Admitting: Allergy and Immunology

## 2019-09-26 NOTE — Telephone Encounter (Signed)
Pt. States he was waiting on a test because of results of the H Pylori results. Says some one called him and told him they would be sending something to his home and he has not received anything yet.

## 2019-09-26 NOTE — Telephone Encounter (Signed)
I mailed out those request forms for labs but will remail them

## 2019-09-26 NOTE — Telephone Encounter (Signed)
Remailed out lab request forms

## 2019-09-27 ENCOUNTER — Telehealth: Payer: Self-pay

## 2019-09-27 NOTE — Telephone Encounter (Signed)
Call placed to patient regarding financial assistance for his pulmonary rehab program .  He stated that he is aware of the plan and understands there is a $1000 limit on assistance with the cost of the program.  He said that has been explained to him by the pulmonary department and he is very appreciative of the assistance.  Instructed him to save the bills that he receives for their services and turn them into this CM at [facility].  He explained that he has the schedule for his appointments and is anxious to get started.

## 2019-10-04 DIAGNOSIS — R079 Chest pain, unspecified: Secondary | ICD-10-CM | POA: Diagnosis not present

## 2019-10-05 LAB — BASIC METABOLIC PANEL
BUN/Creatinine Ratio: 15 (ref 9–20)
BUN: 16 mg/dL (ref 6–24)
CO2: 25 mmol/L (ref 20–29)
Calcium: 9.6 mg/dL (ref 8.7–10.2)
Chloride: 100 mmol/L (ref 96–106)
Creatinine, Ser: 1.07 mg/dL (ref 0.76–1.27)
GFR calc Af Amer: 93 mL/min/{1.73_m2} (ref >59)
GFR calc non Af Amer: 81 mL/min/{1.73_m2} (ref >59)
Glucose: 105 mg/dL — ABNORMAL HIGH (ref 65–99)
Potassium: 4.6 mmol/L (ref 3.5–5.2)
Sodium: 140 mmol/L (ref 134–144)

## 2019-10-09 ENCOUNTER — Telehealth: Payer: Self-pay | Admitting: Nurse Practitioner

## 2019-10-09 ENCOUNTER — Telehealth: Payer: Self-pay

## 2019-10-09 NOTE — Telephone Encounter (Signed)
Copied from CRM [id number]. Topic: General - Other >> Oct 08, 2019  5:03 PM Marylen Ponto wrote: Reason for CRM: Pt requests call back from [facility] regarding pulmonary rehab. Cb# [phone]

## 2019-10-09 NOTE — Telephone Encounter (Signed)
Call returned to patient.  He explained that medicaid denied coverage for the endobronchial valve procedure.  He now has an upcoming hearing on 10/30/2019 to appeal the denial and is not sure what he needs to do to prepare. He said he does not know who his medicaid caseworker is.  Instructed him to call DSS # [phone] to inquire who the caseworker is.  Also explained to him that [employer] may be able to assist him with the denied procedure appeal  and he was in agreement to placing the referral to Legal Aid. Referral then sent to [facility].  He said that he received a message from [facility] but he was not sure what department noting that the cost of this procedure is $62,000 and they are giving him a $43,000 discount but he is not able to afford the $19,000 balance. He would like to know more about the itemized costs.  He has been trying to contact the provider that he sees at [facility].  He has been sending emails and calling and leaving messages; but has not received a response.  He said that the message he received about the cost of the procedure has no contact information and the message has now disappeared from MyChart.He will continue to try to reach out to his provider at [facility].   He inquired about transportation to medical appointments. Explained to him that medicaid provides transportation to those appointments free of charge and they usually have certain days that they will transport out of [facility].  Instructed him to call DSS to register for transportation services.   He is requesting a handicap placard.  Informed him that he would be contacted after Dr Delford Field signs off on it.

## 2019-10-11 ENCOUNTER — Encounter (HOSPITAL_COMMUNITY): Payer: Self-pay

## 2019-10-11 ENCOUNTER — Encounter (HOSPITAL_COMMUNITY)
Admission: RE | Admit: 2019-10-11 | Discharge: 2019-10-11 | Disposition: A | Discharge status: Home / Self Care | Payer: Medicaid Other | Source: Ambulatory Visit | Attending: Critical Care Medicine | Admitting: Critical Care Medicine

## 2019-10-11 ENCOUNTER — Other Ambulatory Visit: Payer: Self-pay

## 2019-10-11 VITALS — BP 98/70 | HR 87 | Resp 14

## 2019-10-11 DIAGNOSIS — J432 Centrilobular emphysema: Secondary | ICD-10-CM | POA: Diagnosis not present

## 2019-10-11 NOTE — Progress Notes (Signed)
Richard Fuentes 49 y.o. male Pulmonary Rehab Orientation Note [name] who was referred to Pulmonary rehab by Dr. Shan Levans with the diagnosis of centrilobular emphysema arrived today in Cardiac and Pulmonary Rehab department ambulatory from the [facility]. He short of breath and need several minutes to catch his breath. Richard Fuentes has a normal gait. He does not carry portable oxygen.  Per pt, he uses oxygen never. Color good, skin warm and dry. Patient is oriented to time and place. Patient's medical history, psychosocial health, and medications reviewed. Psychosocial assessment reveals pt lives with an adult companion. Pt is currently unemployed. 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  Although it is difficult for him to concentrate due to his history of OCD. Pt reports his stress level is high. Areas of stress/anxiety include Health Finances.  Pt does exhibit signs of depression. Signs of depression include anxiety and difficulty falling asleep, difficulty maintaining sleep and early morning awakenings. PHQ2/9 score 4/22. Pt shows fair  coping skills with negative outlook due to the many setbacks he has endured dealing with chronic illness of Emphysema.  Pt has needed medication such as trilogy that work well for him but are not covered by [facility].  Pt also qualifies for the Zipher procedure and again Medicaid has denied the treatment.  Pt has been evaluated by the lung transplant team at [facility].  Unfortunately even with the severity of his lung process, he is not a candidate at this point.  Adir was offered emotional support and reassurance. Will continue to monitor and evaluate progress toward psychosocial goal(s) of getting stronger in preparation for hopeful Zipher procedure at [facility].  Medicaid has not approved the procedure and Jachai has upcoming hearing after unsuccessful mitigation.  Physical assessment reveals heart rate is normal, breath sounds diminished, with no air flow on either the  right or left side.  Grip strength equal, strong. Distal pulses palpable with no swelling.  Vital signs taken which showed low bp 94/60.  Pt has not eaten today.  Pt stated that he simply forgot.  Pt given water, chips and cookies.   Pt  states that he doesn't;t eat much because it causes his abdominal area to swell which interferes with his breathing. Pt reminded that he must eat something prior to coming to exercise.  Pt stated that he would do better. Patient reports he does take medications as prescribed. Patient states he follows a Regular diet. Pt primary concern is his breathing and admits this is the heaviest he has been.  Patient's weight will be monitored closely. Demonstration and practice of PLB using pulse oximeter. Patient able to return demonstration satisfactorily. Safety and hand hygiene in the exercise area reviewed with patient. Patient voices understanding of the information reviewed. Department expectations discussed with patient and achievable goals were set. The patient shows enthusiasm about attending the program and we look forward to working with this nice gentleman.  Looking at transportation service through [employer]. Pt does not have drivers license.  Pt Aunt brought him today for this appt.   45 minutes was spent on a variety of activities such as assessment of the patient, obtaining baseline data including height, weight, BMI, and grip strength, verifying medical history, allergies, and current medications, and teaching patient strategies for performing tasks with less respiratory effort with emphasis on pursed lip breathing. [name], BSN Cardiac and Emergency planning/management officer

## 2019-10-11 NOTE — Progress Notes (Signed)
Pulmonary Individual Treatment Plan  Patient Details  Name: Richard Fuentes MRN: [id number] Date of Birth: 30-Aug-1970 Referring Provider:    Initial Encounter Date:   Visit Diagnosis: Centriacinar emphysema (HCC)  Patient's Home Medications on Admission:   Current Outpatient Medications:  .  albuterol (VENTOLIN HFA) 108 (90 Base) MCG/ACT inhaler, Inhale 2 puffs into the lungs every 6 (six) hours as needed for wheezing or shortness of breath., Disp: 19 g, Rfl: 1 .  Fluticasone-Umeclidin-Vilant (TRELEGY ELLIPTA) 200-62.5-25 MCG/INH AEPB, Inhale 1 puff into the lungs daily., Disp: 60 each, Rfl: 3 .  ipratropium-albuterol (DUONEB) 0.5-2.5 (3) MG/3ML SOLN, Inhale into the lungs., Disp: , Rfl:  .  Vitamin D, Ergocalciferol, (DRISDOL) 1.25 MG (50000 UNIT) CAPS capsule, Take 1 capsule (50,000 Units total) by mouth every 7 (seven) days., Disp: 12 capsule, Rfl: 1 .  fluticasone (FLONASE) 50 MCG/ACT nasal spray, Place 2 sprays into both nostrils daily as needed for allergies or rhinitis. (Patient not taking: Reported on 10/11/2019), Disp: 18.2 mL, Rfl: 5 .  levocetirizine (XYZAL) 5 MG tablet, Take 1 tablet once daily as needed for runny nose or itchy eyes. (Patient not taking: Reported on 10/11/2019), Disp: 34 tablet, Rfl: 5  Past Medical History: Past Medical History:  Diagnosis Date  . Anxiety   . Asthma   . COPD (chronic obstructive pulmonary disease) (HCC)    Severe centrilobular and paraseptal bullous emphysema; Gold stage D; FEV1 and DLCO<30%.;  Currently undergoing transplant evaluation  . Environmental allergies   . Esophagitis   . Hx of migraines   . OCD (obsessive compulsive disorder)   . Psoriasis   . Urticaria     Tobacco Use: Social History   Tobacco Use  Smoking Status Former Smoker  . Packs/day: 1.00  . Years: 30.00  . Pack years: 30.00  . Types: Cigarettes  . Quit date: 01/19/2018  . Years since quitting: 1.7  Smokeless Tobacco Never Used    Labs: Recent Review  Advice worker    Labs for ITP Cardiac and Pulmonary Rehab Latest Ref Rng & Units 04/28/2010 12/07/2018 05/28/2019   Cholestrol 100 - 199 mg/dL - 409(W) -   LDLCALC 0 - 99 mg/dL - 119(J) -   HDL >47 mg/dL - 50 -   Trlycerides 0 - 149 mg/dL - 829 -   Hemoglobin F6O 4.8 - 5.6 % - 5.8(H) -   PHART 7.35 - 7.45 - - 7.428   PCO2ART 32 - 48 mmHg - - 39.9   HCO3 20.0 - 28.0 mmol/L - - 25.9   TCO2 0 - 100 mmol/L 27 - -   O2SAT % - - 98.0      Capillary Blood Glucose: No results found for: GLUCAP   Pulmonary Assessment Scores:  Pulmonary Assessment Scores    Row Name 10/11/19 1645         ADL UCSD   ADL Phase Entry     SOB Score total 88       CAT Score   CAT Score 35       mMRC Score   mMRC Score 4           UCSD: Self-administered rating of dyspnea associated with activities of daily living (ADLs) 6-point scale (0 = "not at all" to 5 = "maximal or unable to do because of breathlessness")  Scoring Scores range from 0 to 120.  Minimally important difference is 5 units  CAT: CAT can identify the health impairment of COPD patients and is better correlated  with disease progression.  CAT has a scoring range of zero to 40. The CAT score is classified into four groups of low (less than 10), medium (10 - 20), high (21-30) and very high (31-40) based on the impact level of disease on health status. A CAT score over 10 suggests significant symptoms.  A worsening CAT score could be explained by an exacerbation, poor medication adherence, poor inhaler technique, or progression of COPD or comorbid conditions.  CAT MCID is 2 points  mMRC: mMRC (Modified Medical Research Council) Dyspnea Scale is used to assess the degree of baseline functional disability in patients of respiratory disease due to dyspnea. No minimal important difference is established. A decrease in score of 1 point or greater is considered a positive change.   Pulmonary Function Assessment:   Exercise Target  Goals: Exercise Program Goal: Individual exercise prescription set using results from initial 6 min walk test and THRR while considering  patient's activity barriers and safety.   Exercise Prescription Goal: Initial exercise prescription builds to 30-45 minutes a day of aerobic activity, 2-3 days per week.  Home exercise guidelines will be given to patient during program as part of exercise prescription that the participant will acknowledge.  Activity Barriers & Risk Stratification:  Activity Barriers & Cardiac Risk Stratification - 10/11/19 1518      Activity Barriers & Cardiac Risk Stratification   Activity Barriers Shortness of Breath;Deconditioning;Back Problems    Cardiac Risk Stratification Moderate           6 Minute Walk:  6 Minute Walk    Row Name 10/11/19 1620         6 Minute Walk   Phase Initial     Distance 1034 feet     Walk Time 6 minutes     # of Rest Breaks 0     MPH 1.96     METS 3.59     RPE 15     Perceived Dyspnea  3     VO2 Peak 12.57     Symptoms Yes (comment)     Comments SOB and chest tightness     Resting HR 83 bpm     Resting BP 94/60     Resting Oxygen Saturation  96 %     Exercise Oxygen Saturation  during 6 min walk 95 %     Max Ex. HR 111 bpm     Max Ex. BP 110/70     2 Minute Post BP 102/70       Interval HR   1 Minute HR 100     2 Minute HR 105     3 Minute HR 98     4 Minute HR 110     5 Minute HR 111     6 Minute HR 108     2 Minute Post HR 83     Interval Heart Rate? Yes       Interval Oxygen   Interval Oxygen? Yes     Baseline Oxygen Saturation % 96 %     1 Minute Oxygen Saturation % 96 %     1 Minute Liters of Oxygen 0 L     2 Minute Oxygen Saturation % 96 %     2 Minute Liters of Oxygen 0 L     3 Minute Oxygen Saturation % 97 %     3 Minute Liters of Oxygen 0 L     4 Minute Oxygen Saturation % 96 %  4 Minute Liters of Oxygen 0 L     5 Minute Oxygen Saturation % 95 %     5 Minute Liters of Oxygen 0 L     6  Minute Oxygen Saturation % 96 %     6 Minute Liters of Oxygen 0 L     2 Minute Post Oxygen Saturation % 98 %     2 Minute Post Liters of Oxygen 0 L            Oxygen Initial Assessment:  Oxygen Initial Assessment - 10/11/19 1619      Home Oxygen   Home Exercise Oxygen Prescription None    Home Resting Oxygen Prescription None    Compliance with Home Oxygen Use Yes      Initial 6 min Walk   Oxygen Used None      Program Oxygen Prescription   Program Oxygen Prescription None      Intervention   Short Term Goals To learn and understand importance of monitoring SPO2 with pulse oximeter and demonstrate accurate use of the pulse oximeter.;To learn and understand importance of maintaining oxygen saturations>88%;To learn and demonstrate proper pursed lip breathing techniques or other breathing techniques.;To learn and demonstrate proper use of respiratory medications    Long  Term Goals Verbalizes importance of monitoring SPO2 with pulse oximeter and return demonstration;Exhibits proper breathing techniques, such as pursed lip breathing or other method taught during program session;Maintenance of O2 saturations>88%;Compliance with respiratory medication;Demonstrates proper use of MDI's           Oxygen Re-Evaluation:   Oxygen Discharge (Final Oxygen Re-Evaluation):   Initial Exercise Prescription:   Perform Capillary Blood Glucose checks as needed.  Exercise Prescription Changes:   Exercise Comments:   Exercise Goals and Review:  Exercise Goals    Row Name 10/11/19 1517             Exercise Goals   Increase Physical Activity Yes       Intervention Provide advice, education, support and counseling about physical activity/exercise needs.;Develop an individualized exercise prescription for aerobic and resistive training based on initial evaluation findings, risk stratification, comorbidities and participant's personal goals.       Expected Outcomes Short Term: Attend  rehab on a regular basis to increase amount of physical activity.;Long Term: Exercising regularly at least 3-5 days a week.;Long Term: Add in home exercise to make exercise part of routine and to increase amount of physical activity.       Increase Strength and Stamina Yes       Intervention Provide advice, education, support and counseling about physical activity/exercise needs.;Develop an individualized exercise prescription for aerobic and resistive training based on initial evaluation findings, risk stratification, comorbidities and participant's personal goals.       Expected Outcomes Short Term: Increase workloads from initial exercise prescription for resistance, speed, and METs.;Short Term: Perform resistance training exercises routinely during rehab and add in resistance training at home;Long Term: Improve cardiorespiratory fitness, muscular endurance and strength as measured by increased METs and functional capacity ( )       Able to understand and use rate of perceived exertion (RPE) scale Yes       Intervention Provide education and explanation on how to use RPE scale       Expected Outcomes Short Term: Able to use RPE daily in rehab to express subjective intensity level;Long Term:  Able to use RPE to guide intensity level when exercising independently  Able to understand and use Dyspnea scale Yes       Intervention Provide education and explanation on how to use Dyspnea scale       Expected Outcomes Short Term: Able to use Dyspnea scale daily in rehab to express subjective sense of shortness of breath during exertion;Long Term: Able to use Dyspnea scale to guide intensity level when exercising independently       Knowledge and understanding of Target Heart Rate Range (THRR) Yes       Intervention Provide education and explanation of THRR including how the numbers were predicted and where they are located for reference       Expected Outcomes Short Term: Able to state/look up  THRR;Long Term: Able to use THRR to govern intensity when exercising independently;Short Term: Able to use daily as guideline for intensity in rehab       Understanding of Exercise Prescription Yes       Intervention Provide education, explanation, and written materials on patient's individual exercise prescription       Expected Outcomes Short Term: Able to explain program exercise prescription;Long Term: Able to explain home exercise prescription to exercise independently              Exercise Goals Re-Evaluation :   Discharge Exercise Prescription (Final Exercise Prescription Changes):   Nutrition:  Target Goals: Understanding of nutrition guidelines, daily intake of sodium 1500mg , cholesterol 200mg , calories 30% from fat and 7% or less from saturated fats, daily to have 5 or more servings of fruits and vegetables.  Biometrics:  Pre Biometrics - 10/11/19 1644      Pre Biometrics   Grip Strength 47 kg            Nutrition Therapy Plan and Nutrition Goals:   Nutrition Assessments:   Nutrition Goals Re-Evaluation:   Nutrition Goals Discharge (Final Nutrition Goals Re-Evaluation):   Psychosocial: Target Goals: Acknowledge presence or absence of significant depression and/or stress, maximize coping skills, provide positive support system. Participant is able to verbalize types and ability to use techniques and skills needed for reducing stress and depression.  Initial Review & Psychosocial Screening:  Initial Psych Review & Screening - 10/11/19 1521      Initial Review   Current issues with History of Depression;Current Depression;Current Anxiety/Panic;Current Sleep Concerns;Current Stress Concerns    Source of Stress Concerns Chronic Illness;Financial;Retirement/disability;Unable to participate in former interests or hobbies;Unable to perform yard/household activities    Comments lives with girlfriend who is supportive      Family Dynamics   Good Support System?  Yes    Concerns Inappropriate over/under dependence on family/friends      Barriers   Psychosocial barriers to participate in program The patient should benefit from training in stress management and relaxation.;Psychosocial barriers identified (see note)      Screening Interventions   Interventions To provide support and resources with identified psychosocial needs;Provide feedback about the scores to participant;Encouraged to exercise           Quality of Life Scores:  Scores of 19 and below usually indicate a poorer quality of life in these areas.  A difference of  2-3 points is a clinically meaningful difference.  A difference of 2-3 points in the total score of the Quality of Life Index has been associated with significant improvement in overall quality of life, self-image, physical symptoms, and general health in studies assessing change in quality of life.  PHQ-9: Recent Review Flowsheet Data    Depression  screen [facility] 2/9 10/11/2019 07/31/2019 05/01/2019 12/07/2018 12/07/2017   Decreased Interest 2 0 3 0 2   Down, Depressed, Hopeless 2 2 3  0 2   PHQ - 2 Score 4 2 6  0 4   Altered sleeping 3 3 3  0 3   Tired, decreased energy 3 3 3  0 3   Change in appetite 3 0 2 0 3   Feeling bad or failure about yourself  3 3 3  0 2   Trouble concentrating 3 3 3  0 2   Moving slowly or fidgety/restless 3 1 3  0 2   Suicidal thoughts 0 0 0 0 0   PHQ-9 Score 22 15 23  0 19   Difficult doing work/chores Somewhat difficult - Extremely dIfficult - -     Interpretation of Total Score  Total Score Depression Severity:  1-4 = Minimal depression, 5-9 = Mild depression, 10-14 = Moderate depression, 15-19 = Moderately severe depression, 20-27 = Severe depression   Psychosocial Evaluation and Intervention:  Psychosocial Evaluation - 10/11/19 1525      Psychosocial Evaluation & Interventions   Interventions Stress management education;Relaxation education;Encouraged to exercise with the program and follow  exercise prescription    Continue Psychosocial Services  No Follow up required           Psychosocial Re-Evaluation:   Psychosocial Discharge (Final Psychosocial Re-Evaluation):   Education: Education Goals: Education classes will be provided on a weekly basis, covering required topics. Participant will state understanding/return demonstration of topics presented.  Learning Barriers/Preferences:  Learning Barriers/Preferences - 10/11/19 1526      Learning Barriers/Preferences   Learning Barriers Sight    Learning Preferences Computer/Internet;Individual Instruction;Video           Education Topics: Risk Factor Reduction:  -Group instruction that is supported by a PowerPoint presentation. Instructor discusses the definition of a risk factor, different risk factors for pulmonary disease, and how the heart and lungs work together.     Nutrition for Pulmonary Patient:  -Group instruction provided by PowerPoint slides, verbal discussion, and written materials to support subject matter. The instructor gives an explanation and review of healthy diet recommendations, which includes a discussion on weight management, recommendations for fruit and vegetable consumption, as well as protein, fluid, caffeine, fiber, sodium, sugar, and alcohol. Tips for eating when patients are short of breath are discussed.   Pursed Lip Breathing:  -Group instruction that is supported by demonstration and informational handouts. Instructor discusses the benefits of pursed lip and diaphragmatic breathing and detailed demonstration on how to preform both.     Oxygen Safety:  -Group instruction provided by PowerPoint, verbal discussion, and written material to support subject matter. There is an overview of "What is Oxygen" and "Why do we need it".  Instructor also reviews how to create a safe environment for oxygen use, the importance of using oxygen as prescribed, and the risks of noncompliance. There is a  brief discussion on traveling with oxygen and resources the patient may utilize.   Oxygen Equipment:  -Group instruction provided by [facility] Staff utilizing handouts, written materials, and equipment demonstrations.   Signs and Symptoms:  -Group instruction provided by written material and verbal discussion to support subject matter. Warning signs and symptoms of infection, stroke, and heart attack are reviewed and when to call the physician/911 reinforced. Tips for preventing the spread of infection discussed.   Advanced Directives:  -Group instruction provided by verbal instruction and written material to support subject matter. Instructor reviews Advanced  Directive laws and proper instruction for filling out document.   Pulmonary Video:  -Group video education that reviews the importance of medication and oxygen compliance, exercise, good nutrition, pulmonary hygiene, and pursed lip and diaphragmatic breathing for the pulmonary patient.   Exercise for the Pulmonary Patient:  -Group instruction that is supported by a PowerPoint presentation. Instructor discusses benefits of exercise, core components of exercise, frequency, duration, and intensity of an exercise routine, importance of utilizing pulse oximetry during exercise, safety while exercising, and options of places to exercise outside of rehab.     Pulmonary Medications:  -Verbally interactive group education provided by instructor with focus on inhaled medications and proper administration.   Anatomy and Physiology of the Respiratory System and Intimacy:  -Group instruction provided by PowerPoint, verbal discussion, and written material to support subject matter. Instructor reviews respiratory cycle and anatomical components of the respiratory system and their functions. Instructor also reviews differences in obstructive and restrictive respiratory diseases with examples of each. Intimacy, Sex, and Sexuality differences are  reviewed with a discussion on how relationships can change when diagnosed with pulmonary disease. Common sexual concerns are reviewed.   MD DAY -A group question and answer session with a medical doctor that allows participants to ask questions that relate to their pulmonary disease state.   OTHER EDUCATION -Group or individual verbal, written, or video instructions that support the educational goals of the pulmonary rehab program.   Holiday Eating Survival Tips:  -Group instruction provided by PowerPoint slides, verbal discussion, and written materials to support subject matter. The instructor gives patients tips, tricks, and techniques to help them not only survive but enjoy the holidays despite the onslaught of food that accompanies the holidays.   Knowledge Questionnaire Score:  Knowledge Questionnaire Score - 10/11/19 1642      Knowledge Questionnaire Score   Pre Score 15/18           Core Components/Risk Factors/Patient Goals at Admission:  Personal Goals and Risk Factors at Admission - 10/11/19 1527      Core Components/Risk Factors/Patient Goals on Admission   Improve shortness of breath with ADL's Yes    Intervention Provide education, individualized exercise plan and daily activity instruction to help decrease symptoms of SOB with activities of daily living.    Expected Outcomes Short Term: Improve cardiorespiratory fitness to achieve a reduction of symptoms when performing ADLs;Long Term: Be able to perform more ADLs without symptoms or delay the onset of symptoms    Stress Yes    Intervention Offer individual and/or small group education and counseling on adjustment to heart disease, stress management and health-related lifestyle change. Teach and support self-help strategies.;Refer participants experiencing significant psychosocial distress to appropriate mental health specialists for further evaluation and treatment. When possible, include family members and significant  others in education/counseling sessions.    Expected Outcomes Short Term: Participant demonstrates changes in health-related behavior, relaxation and other stress management skills, ability to obtain effective social support, and compliance with psychotropic medications if prescribed.;Long Term: Emotional wellbeing is indicated by absence of clinically significant psychosocial distress or social isolation.           Core Components/Risk Factors/Patient Goals Review:    Core Components/Risk Factors/Patient Goals at Discharge (Final Review):    ITP Comments:   Comments:

## 2019-10-15 NOTE — Telephone Encounter (Signed)
Call placed to patient and informed him that Dr Delford Field signed the handicap placard application and it will be at the front desk for him to pick up.

## 2019-10-16 ENCOUNTER — Encounter (HOSPITAL_COMMUNITY)
Admission: RE | Admit: 2019-10-16 | Discharge: 2019-10-16 | Disposition: A | Discharge status: Home / Self Care | Payer: Medicaid Other | Source: Ambulatory Visit | Attending: Critical Care Medicine | Admitting: Critical Care Medicine

## 2019-10-16 ENCOUNTER — Other Ambulatory Visit: Payer: Self-pay

## 2019-10-16 DIAGNOSIS — J432 Centrilobular emphysema: Secondary | ICD-10-CM

## 2019-10-16 NOTE — Progress Notes (Signed)
Incomplete Session Note  Patient Details  Name: Richard Fuentes MRN: [id number] Date of Birth: May 27, 1970 Referring Provider:     Pulmonary Rehab Walk Test from 10/11/2019 in [facility]ARDI[facility]  Referring Provider Dr. Shan Levans      Billie Ruddy did not complete his rehab session.  Pt in today for his first exercise session.  Pt arrived distraught and short of breath. Pt stated that he was not having a good breathing day.  Pt sat down in chair.  Oxygen level 97% heart rate 80.  Bp checked 98/56 soft to hear.  Pt had not eaten anything this morning or had anything to drink.  Pt stated that he ate too much pizza last night which impairs his diaphragm for breathing.  Pt further reports that he knew he ate too much.  Pt anxiety level is very high today, pt aunt arrived early which caused pt inner anxiety although she indicated she was early and did not mind waiting outside in the car.  [name] admits he got panicky and did not want to come to exercise.  Pt only came because he thought he would be charged for today even if he did not participate.  Explained to him the billing and charging process.  Pt will return on Thursday and will consider if coming to a later class would cause him to not feel as anxious verses morning appt. [name], BSN Cardiac and Emergency planning/management officer

## 2019-10-18 ENCOUNTER — Encounter (HOSPITAL_COMMUNITY)
Admission: RE | Admit: 2019-10-18 | Discharge: 2019-10-18 | Disposition: A | Discharge status: Home / Self Care | Payer: Medicaid Other | Source: Ambulatory Visit | Attending: Critical Care Medicine | Admitting: Critical Care Medicine

## 2019-10-18 ENCOUNTER — Other Ambulatory Visit: Payer: Self-pay

## 2019-10-18 DIAGNOSIS — J432 Centrilobular emphysema: Secondary | ICD-10-CM

## 2019-10-18 NOTE — Progress Notes (Signed)
Daily Session Note  Patient Details  Name: Richard Fuentes MRN: [id number] Date of Birth: 12-Jan-1970 Referring Provider:     Pulmonary Rehab Walk Test from 10/11/2019 in [place]  Referring Provider Dr. Asencion Fuentes      Encounter Date: 10/18/2019  Check In:  Session Check In - 10/18/19 1150      Check-In   Supervising physician immediately available to respond to emergencies [facility] immediately available    Physician(s) Dr. Lonny Prude    Location [facility]    Staff Present [name], RN, BSN;[name], RN;[name], MS, ACSM-CEP, Exercise Physiologist    Virtual Visit No    Medication changes reported     No    Fall or balance concerns reported    No    Tobacco Cessation No Change    Warm-up and Cool-down Performed on first and last piece of equipment    Resistance Training Performed Yes    VAD Patient? No    PAD/SET Patient? No      Pain Assessment   Currently in Pain? No/denies    Pain Score 0-No pain    Multiple Pain Sites No           Capillary Blood Glucose: No results found for this or any previous visit (from the past 24 hour(s)).    Social History   Tobacco Use  Smoking Status Former Smoker  . Packs/day: 1.00  . Years: 30.00  . Pack years: 30.00  . Types: Cigarettes  . Quit date: 01/19/2018  . Years since quitting: 1.7  Smokeless Tobacco Never Used    Goals Met:  Proper associated with RPD/PD & O2 Sat Exercise tolerated well Strength training completed today  Goals Unmet:  Not Applicable  Comments: Service time is from 1102 to 1210 Pt completed first day of exercise and tolerated well    Dr. Fransico Him is Medical Director for Cardiac Rehab at [facility].

## 2019-10-23 ENCOUNTER — Encounter (HOSPITAL_COMMUNITY): Payer: Medicaid Other

## 2019-10-23 ENCOUNTER — Telehealth (HOSPITAL_COMMUNITY): Payer: Self-pay

## 2019-10-23 NOTE — Telephone Encounter (Signed)
Pt called and stated that he is not feeling well and that he will not be able to attend his pulmonary rehab session today. Advised his nurse and EP.

## 2019-10-24 ENCOUNTER — Encounter: Payer: Self-pay | Admitting: Critical Care Medicine

## 2019-10-24 ENCOUNTER — Other Ambulatory Visit: Payer: Self-pay

## 2019-10-24 ENCOUNTER — Ambulatory Visit: Payer: Medicaid Other | Attending: Critical Care Medicine | Admitting: Critical Care Medicine

## 2019-10-24 VITALS — BP 115/82 | HR 71 | Resp 16 | Wt 194.8 lb

## 2019-10-24 DIAGNOSIS — Z8249 Family history of ischemic heart disease and other diseases of the circulatory system: Secondary | ICD-10-CM | POA: Diagnosis not present

## 2019-10-24 DIAGNOSIS — J431 Panlobular emphysema: Secondary | ICD-10-CM | POA: Insufficient documentation

## 2019-10-24 DIAGNOSIS — F419 Anxiety disorder, unspecified: Secondary | ICD-10-CM | POA: Insufficient documentation

## 2019-10-24 DIAGNOSIS — Z79899 Other long term (current) drug therapy: Secondary | ICD-10-CM | POA: Diagnosis not present

## 2019-10-24 DIAGNOSIS — F411 Generalized anxiety disorder: Secondary | ICD-10-CM | POA: Diagnosis not present

## 2019-10-24 DIAGNOSIS — G4733 Obstructive sleep apnea (adult) (pediatric): Secondary | ICD-10-CM | POA: Insufficient documentation

## 2019-10-24 DIAGNOSIS — F429 Obsessive-compulsive disorder, unspecified: Secondary | ICD-10-CM | POA: Diagnosis not present

## 2019-10-24 DIAGNOSIS — Z87891 Personal history of nicotine dependence: Secondary | ICD-10-CM | POA: Diagnosis not present

## 2019-10-24 DIAGNOSIS — Z880 Allergy status to penicillin: Secondary | ICD-10-CM | POA: Diagnosis not present

## 2019-10-24 NOTE — Progress Notes (Signed)
Subjective:    Patient ID: Richard Fuentes, male    DOB: June 08, 1970, 49 y.o.   MRN: [id number] History of Present Illness: First visit March 20, 2019 49 y.o.M with Copd/ asthma with freq ED visits ref from PCP for pulmonary evaluation The patient notes since 2013 the onset of asthma type symptoms of the progressively gotten worse he is never seen a pulmonologist.  At that time he also had significant periodontal disease and a dental infection that was not addressed.  Patient has history of generalized anxiety disorder and obsessive-compulsive disorder and this prevents him from taking medications at times.  He is very fearful of any particular treatments and tries to minimize medications.  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  He was in the emergency room in February given a pulse dose of prednisone but did not seem to help.  He has been followed at [facility] however he is on no medications does not return to [place] since prior to Covid onset.  He did himself develop Covid viral infection in January and since that time has had chronic fatigue.  The patient is on Symbicort 2 inhalations twice daily through patient assistance and states despite this is not under optimal control.  He states he has difficulty with laying flat also difficulty with eating meals without becoming full early The patient has not had pulmonary functions as of now  05/01/2019 Patient seen in return follow-up for severe COPD with bullous emphysema.  CT scan of chest has been obtained and shows severe bullous emphysema in the apices but also centrilobular emphysema diffusely.  The patient states the Trelegy inhaler has been helpful however the patient continues to have difficulty with dyspnea on exertion.  He wakens at night with severe shortness of breath as well.  He no longer  is smoking tobacco products.  Anxiety is a major issue for this patient and he states the antianxiety medication has been of some benefit at this time.  The patient's pulmonary functions have been obtained and show Gold stage D COPD with FEV1 less than 30% predicted diffusion capacity less than 30% predicted and severe residual volume air trapping  06/05/2019 This is a video visit and the patient returns in follow-up and does have an upcoming appointment at the [facility] transplant center and has received Medicaid for disability  Patient still has severe dyspnea with minimal exertion.  He has a sleep study pending for July.  He is now on multivitamins.  He complains of worsening plaque psoriasis is requesting dermatology referral.  He has had psoriasis for 14 years but it appears to be worsening more recently.  He has a nonproductive cough has difficulty raising secretions at this time this appears to have worsened as well.  He maintains Trelegy daily and uses his nebulizer at least twice daily and does have a flutter valve in place  07/31/2019 This is a face-to-face visit follow-up for severe COPD end-stage Gold stage D centrilobular emphysema.  The patient also has severe plaque psoriasis. Note this patient went to a dermatologist for the plaque psoriasis and it was felt that the patient would only benefit from topical therapy that he had tried these before and did not see improvement.  He states his psoriasis is progressing.  Note the patient is already been screened to the transplant clinic for tuberculosis  hepatitis AB and C all of which were negative  Patient also is in the transplant clinic being evaluated for potential transplant they wish to hold on this for now.  He is also being evaluated for potential lung volume reduction  Patient's blood gases in the clinic are unchanged recent chest x-ray is unchanged CT scan is pending  Patient states his dyspnea is so severe he cannot complete a shower  and is requesting a shower stool he also would like an alpha-1 antitrypsin level checked I thought we had checked this previously but no evidence of this is seen so an alpha-1 level needs to be checked   Patient states he is also requesting a cardiology referral as [facility] is requesting this as part of his pulmonary transplant eval  09/13/2019 Video visit The patient wishes to follow-up to discuss several issues #1 cost of covering pulmonary rehab #2 concerns over psoriasis wishing a second opinion #3 needing CPAP machine now that he has Medicaid #4 cost of placing pulmonary valve treatments and the lack of Medicaid coverage  Patient went through mediation and it was determined by Medicaid his pulmonary valves would not be covered therefore he is not having this done.  The patient needs financial assistance for pulmonary rehab or attempting to identify funds for this.  Patient now has Medicaid is wishing a CPAP machine.  Patient complains of lack of progress with his dermatologist which is a second opinion.  There is been no real change in the patient's level of dyspnea.  His transplant of his lung needs to be put off for a period of time.  10/24/2019 Patient seen return follow-up still has severe COPD symptoms with shortness of breath with minimal exertion.  [facility].  He is a state he is not far enough along to receive this yet.  The patient now has insurance access and is wishing to proceed with CPAP machine because of he has sleep apnea due to the elevated AHI  The patient was [facility] because his insurance would not cover  Shortness of Breath This is a chronic problem. The current episode started more than 1 year ago. The problem occurs constantly. The problem has been rapidly worsening. Associated symptoms include chest pain, headaches, orthopnea, PND, a rash, sputum production and wheezing. Pertinent negatives  include no ear pain, hemoptysis or rhinorrhea. The symptoms are aggravated by any activity, exercise, weather changes, fumes, odors, pollens, emotional upset and eating. Risk factors include smoking. He has tried beta agonist inhalers, oral steroids and steroid inhalers for the symptoms. His past medical history is significant for allergies, asthma, COPD and pneumonia. There is no history of CAD, DVT, a heart failure or PE.   Past Medical History:  Diagnosis Date  . Anxiety   . Asthma   . COPD (chronic obstructive pulmonary disease) (HCC)    Severe centrilobular and paraseptal bullous emphysema; Gold stage D; FEV1 and DLCO<30%.;  Currently undergoing transplant evaluation  . Environmental allergies   . Esophagitis   . Hx of migraines   . OCD (obsessive compulsive disorder)   . Psoriasis   . Urticaria      Family History  Problem Relation Age of Onset  . Multiple sclerosis Sister   . Psoriasis Maternal Grandmother   . Heart attack Mother        Late in life  . Heart attack Father [id number]  . CAD Father   .  Heart attack Paternal Grandfather 61       Sudden cardiac death  . Sudden Cardiac Death Paternal Grandfather 27  . Heart attack Maternal Uncle   . CAD Maternal Uncle   . Heart attack Cousin 32  . Sudden Cardiac Death Cousin 74  . Allergic rhinitis Neg Hx   . Angioedema Neg Hx   . Asthma Neg Hx   . Eczema Neg Hx   . Immunodeficiency Neg Hx   . Urticaria Neg Hx      Social History   Socioeconomic History  . Marital status: Single    Spouse name: Not on file  . Number of children: Not on file  . Years of education: Not on file  . Highest education level: Not on file  Occupational History  . Not on file  Tobacco Use  . Smoking status: Former Smoker    Packs/day: 1.00    Years: 30.00    Pack years: 30.00    Types: Cigarettes    Quit date: 01/19/2018    Years since quitting: 1.7  . Smokeless tobacco: Never Used  Vaping Use  . Vaping Use: Never used  Substance and  Sexual Activity  . Alcohol use: No  . Drug use: No  . Sexual activity: Not Currently  Other Topics Concern  . Not on file  Social History Narrative  . Not on file   Social Determinants of Health   Financial Resource Strain:   . Difficulty of Paying Living Expenses: Not on file  Food Insecurity:   . Worried About Programme researcher, broadcasting/film/video in the Last Year: Not on file  . Ran Out of Food in the Last Year: Not on file  Transportation Needs:   . Lack of Transportation (Medical): Not on file  . Lack of Transportation (Non-Medical): Not on file  Physical Activity:   . Days of Exercise per Week: Not on file  . Minutes of Exercise per Session: Not on file  Stress:   . Feeling of Stress : Not on file  Social Connections:   . Frequency of Communication with Friends and Family: Not on file  . Frequency of Social Gatherings with Friends and Family: Not on file  . Attends Religious Services: Not on file  . Active Member of Clubs or Organizations: Not on file  . Attends Banker Meetings: Not on file  . Marital Status: Not on file  Intimate Partner Violence:   . Fear of Current or Ex-Partner: Not on file  . Emotionally Abused: Not on file  . Physically Abused: Not on file  . Sexually Abused: Not on file     Allergies  Allergen Reactions  . Other Anaphylaxis    Wool  . Penicillins Other (See Comments)    Childhood allergy  Has patient had a PCN reaction causing immediate rash, facial/tongue/throat swelling, SOB or lightheadedness with hypotension: n/a Has patient had a PCN reaction causing severe rash involving mucus membranes or skin necrosis: n/a Has patient had a PCN reaction that required hospitalization: n/a Has patient had a PCN reaction occurring within the last 10 years: n/a If all of the above answers are "NO", then may proceed with Cephalosporin use.   Marland Kitchen Shellfish Allergy      Outpatient Medications Prior to Visit  Medication Sig Dispense Refill  . albuterol  (VENTOLIN HFA) 108 (90 Base) MCG/ACT inhaler Inhale 2 puffs into the lungs every 6 (six) hours as needed for wheezing or shortness of breath. 19 g  1  . fluticasone (FLONASE) 50 MCG/ACT nasal spray Place 2 sprays into both nostrils daily as needed for allergies or rhinitis. 18.2 mL 5  . Fluticasone-Umeclidin-Vilant (TRELEGY ELLIPTA) 200-62.5-25 MCG/INH AEPB Inhale 1 puff into the lungs daily. 60 each 3  . ipratropium-albuterol (DUONEB) 0.5-2.5 (3) MG/3ML SOLN Inhale into the lungs.    Marland Kitchen levocetirizine (XYZAL) 5 MG tablet Take 1 tablet once daily as needed for runny nose or itchy eyes. 34 tablet 5  . Vitamin D, Ergocalciferol, (DRISDOL) 1.25 MG (50000 UNIT) CAPS capsule Take 1 capsule (50,000 Units total) by mouth every 7 (seven) days. 12 capsule 1   No facility-administered medications prior to visit.      Review of Systems  HENT: Negative for ear pain and rhinorrhea.   Respiratory: Positive for sputum production, shortness of breath and wheezing. Negative for hemoptysis.   Cardiovascular: Positive for chest pain, orthopnea and PND.  Skin: Positive for rash.  Neurological: Positive for headaches.       Objective:   Physical Exam Vitals:   10/24/19 1040  BP: 115/82  Pulse: 71  Resp: 16  SpO2: 98%  Weight: 194 lb 12.8 oz (88.4 kg)   Gen: Pleasant, well-nourished, in no distress, anxious affect  ENT: No lesions,  mouth clear,  oropharynx clear, no postnasal drip  Neck: No JVD, no TMG, no carotid bruits  Lungs: No use of accessory muscles, no dullness to percussion, distant breath sounds hyperresonance to percussion  Cardiovascular: RRR, heart sounds normal, no murmur or gallops, no peripheral edema  Abdomen: soft and NT, no HSM,  BS normal  Musculoskeletal: No deformities, no cyanosis or clubbing  Neuro: alert, non focal  Skin: Warm, no lesions or rashes   CXR reviewed 02/2019:  Hyperinflation and changes c/w centrilobular emphysema  Sleep study 08/2019 IMPRESSIONS -  Moderate obstructive sleep apnea occurred during this study (AHI = 21.7/h). - No significant central sleep apnea occurred during this study (CAI = 0.0/h). - Oxygen desaturation was noted during this study (Min O2 = 82%). Mean sat 93%. - Patient snored.  DIAGNOSIS - Obstructive Sleep Apnea (G47.33)  RECOMMENDATIONS - Suggest CPAP titration sleep study or autopap. Otherr options would be based on clinical judgment. - Be careful with alcohol, sedatives and other CNS depressants that may worsen sleep apnea and disrupt normal sleep architecture. - Sleep hygiene should be reviewed to assess factors that may improve sleep quality. - Weight management and regular exercise should be initiated or continued.  CT Chest: 4/20 FINDINGS: Cardiovascular: Heart is normal in size. Trace anterior pericardial fluid.  No evidence of thoracic aortic aneurysm.  Mediastinum/Nodes: No suspicious mediastinal lymphadenopathy.  Visualized thyroid is unremarkable.  Lungs/Pleura: Moderate to severe centrilobular and paraseptal emphysematous changes, upper lung predominant.  No focal consolidation.  No suspicious pulmonary nodules.  No pleural effusion or pneumothorax.  Upper Abdomen: Visualized upper abdomen is grossly unremarkable.  Musculoskeletal: Visualized osseous structures are within normal limits.  IMPRESSION: No evidence of acute cardiopulmonary disease.  Emphysema (ICD10-J43.9).  PFTs 4/6:  Ref Range & Units 3 wk ago  FVC-Pre L 2.94   FVC-%Pred-Pre % 52   FVC-Post L 3.02   FVC-%Pred-Post % 54   FVC-%Change-Post % 2   FEV1-Pre L 1.12   FEV1-%Pred-Pre % 25   FEV1-Post L 1.24   FEV1-%Pred-Post % 28   FEV1-%Change-Post % 10   FEV6-Pre L 2.40   FEV6-%Pred-Pre % 44   FEV6-Post L 2.75   FEV6-%Pred-Post % 51   FEV6-%Change-Post % 14  Pre FEV1/FVC ratio % 38   FEV1FVC-%Pred-Pre % 49   Post FEV1/FVC ratio % 41   FEV1FVC-%Change-Post % 7   Pre FEV6/FVC Ratio % 82    FEV6FVC-%Pred-Pre % 84   Post FEV6/FVC ratio % 91   FEV6FVC-%Pred-Post % 94   FEV6FVC-%Change-Post % 11   FEF 25-75 Pre L/sec 0.36   FEF2575-%Pred-Pre % 9   FEF 25-75 Post L/sec 0.53   FEF2575-%Pred-Post % 14   FEF2575-%Change-Post % 49   RV L 5.62   RV % pred % 255   TLC L 8.43   TLC % pred % 111   DLCO unc ml/min/mmHg 10.64   DLCO unc % pred % 33   DL/VA ml/min/mmHg/L 4.09   DL/VA % pred % 61        Assessment & Plan:  I personally reviewed all images and lab data in the [facility] system as well as any outside material available during this office visit and agree with the  radiology impressions.   OSA (obstructive sleep apnea) Severe sleep apnea will order CPAP machine with CPAP range of 8 to 15 cm water pressure AutoSet with download  COPD with emphysema (HCC)GOLD D  Advanced COPD with primary emphysematous component not yet ready for transplant  Continue inhaled medications as prescribed   Carston was seen today for follow-up.  Diagnoses and all orders for this visit:  OSA (obstructive sleep apnea) -     For home use only DME continuous positive airway pressure (CPAP)  Panlobular emphysema (HCC)

## 2019-10-24 NOTE — Assessment & Plan Note (Signed)
Severe sleep apnea will order CPAP machine with CPAP range of 8 to 15 cm water pressure AutoSet with download

## 2019-10-24 NOTE — Assessment & Plan Note (Signed)
Advanced COPD with primary emphysematous component not yet ready for transplant  Continue inhaled medications as prescribed

## 2019-10-24 NOTE — Patient Instructions (Signed)
Refills sent to pharmacy for future refills  No other medication changes  A cpap machine was ordered through [facility] home health  Please follow up with a mental health provider  Please obtain a Covid vaccine COVID-19 Vaccine Information can be found at: [email or web address] For questions related to vaccine distribution or appointments, please email [email or web address] or call [phone].    Telehealth visit with Dr Delford Field in 2 months

## 2019-10-25 ENCOUNTER — Encounter (HOSPITAL_COMMUNITY): Payer: Medicaid Other

## 2019-10-29 ENCOUNTER — Telehealth (HOSPITAL_COMMUNITY): Payer: Self-pay | Admitting: *Deleted

## 2019-10-29 NOTE — Telephone Encounter (Signed)
Called and spoke to pt regarding his absences from pulmonary rehab.  Pt indicated that he was having a really bad "breathing week".  Much better now with prednisone.  Pt will be out tomorrow due to his appeal hearing is from [phone]. Pt also has an  Tele health appt with Dr. Renae Fickle at [facility]. Pt will be unable to exercise on tomorrow and is looking forward to returning on Thursday.  Pt declined the need for assistance to get transportation set up. [name], BSN Cardiac and Emergency planning/management officer

## 2019-10-30 ENCOUNTER — Encounter (HOSPITAL_COMMUNITY): Payer: Medicaid Other

## 2019-10-30 DIAGNOSIS — Z87891 Personal history of nicotine dependence: Secondary | ICD-10-CM | POA: Diagnosis not present

## 2019-10-30 DIAGNOSIS — J432 Centrilobular emphysema: Secondary | ICD-10-CM | POA: Diagnosis not present

## 2019-11-01 ENCOUNTER — Telehealth (HOSPITAL_COMMUNITY): Payer: Self-pay | Admitting: *Deleted

## 2019-11-01 ENCOUNTER — Encounter (HOSPITAL_COMMUNITY): Payer: Medicaid Other

## 2019-11-01 NOTE — Progress Notes (Signed)
Pulmonary Individual Treatment Plan  Patient Details  Name: Richard Fuentes MRN: [id number] Date of Birth: 1970/06/19 Referring Provider:     Pulmonary Rehab Walk Test from 10/11/2019 in [facility] CARDIAC [facility]  Referring Provider Dr. Shan Levans      Initial Encounter Date:    Pulmonary Rehab Walk Test from 10/11/2019 in [facility] CARDIAC REHAB  Date 10/11/19      Visit Diagnosis: Centriacinar emphysema (HCC)  Patient's Home Medications on Admission:   Current Outpatient Medications:  .  albuterol (VENTOLIN HFA) 108 (90 Base) MCG/ACT inhaler, Inhale 2 puffs into the lungs every 6 (six) hours as needed for wheezing or shortness of breath., Disp: 19 g, Rfl: 1 .  fluticasone (FLONASE) 50 MCG/ACT nasal spray, Place 2 sprays into both nostrils daily as needed for allergies or rhinitis., Disp: 18.2 mL, Rfl: 5 .  Fluticasone-Umeclidin-Vilant (TRELEGY ELLIPTA) 200-62.5-25 MCG/INH AEPB, Inhale 1 puff into the lungs daily., Disp: 60 each, Rfl: 3 .  ipratropium-albuterol (DUONEB) 0.5-2.5 (3) MG/3ML SOLN, Inhale into the lungs., Disp: , Rfl:  .  levocetirizine (XYZAL) 5 MG tablet, Take 1 tablet once daily as needed for runny nose or itchy eyes., Disp: 34 tablet, Rfl: 5 .  Vitamin D, Ergocalciferol, (DRISDOL) 1.25 MG (50000 UNIT) CAPS capsule, Take 1 capsule (50,000 Units total) by mouth every 7 (seven) days., Disp: 12 capsule, Rfl: 1  Past Medical History: Past Medical History:  Diagnosis Date  . Anxiety   . Asthma   . COPD (chronic obstructive pulmonary disease) (HCC)    Severe centrilobular and paraseptal bullous emphysema; Gold stage D; FEV1 and DLCO<30%.;  Currently undergoing transplant evaluation  . Environmental allergies   . Esophagitis   . Hx of migraines   . OCD (obsessive compulsive disorder)   . Psoriasis   . Urticaria     Tobacco Use: Social History   Tobacco Use  Smoking Status Former Smoker  . Packs/day: 1.00  . Years: 30.00    . Pack years: 30.00  . Types: Cigarettes  . Quit date: 01/19/2018  . Years since quitting: 1.7  Smokeless Tobacco Never Used    Labs: Recent Review Advice worker    Labs for ITP Cardiac and Pulmonary Rehab Latest Ref Rng & Units 04/28/2010 12/07/2018 05/28/2019   Cholestrol 100 - 199 mg/dL - 371(I) -   LDLCALC 0 - 99 mg/dL - 967(E) -   HDL >93 mg/dL - 50 -   Trlycerides 0 - 149 mg/dL - 810 -   Hemoglobin F7P 4.8 - 5.6 % - 5.8(H) -   PHART 7.35 - 7.45 - - 7.428   PCO2ART 32 - 48 mmHg - - 39.9   HCO3 20.0 - 28.0 mmol/L - - 25.9   TCO2 0 - 100 mmol/L 27 - -   O2SAT % - - 98.0      Capillary Blood Glucose: No results found for: GLUCAP   Pulmonary Assessment Scores:  Pulmonary Assessment Scores    Row Name 10/11/19 1645         ADL UCSD   ADL Phase Entry     SOB Score total 88       CAT Score   CAT Score 35       mMRC Score   mMRC Score 4           UCSD: Self-administered rating of dyspnea associated with activities of daily living (ADLs) 6-point scale (0 = "not at all" to 5 = "maximal or  unable to do because of breathlessness")  Scoring Scores range from 0 to 120.  Minimally important difference is 5 units  CAT: CAT can identify the health impairment of COPD patients and is better correlated with disease progression.  CAT has a scoring range of zero to 40. The CAT score is classified into four groups of low (less than 10), medium (10 - 20), high (21-30) and very high (31-40) based on the impact level of disease on health status. A CAT score over 10 suggests significant symptoms.  A worsening CAT score could be explained by an exacerbation, poor medication adherence, poor inhaler technique, or progression of COPD or comorbid conditions.  CAT MCID is 2 points  mMRC: mMRC (Modified Medical Research Council) Dyspnea Scale is used to assess the degree of baseline functional disability in patients of respiratory disease due to dyspnea. No minimal important difference is  established. A decrease in score of 1 point or greater is considered a positive change.   Pulmonary Function Assessment:   Exercise Target Goals: Exercise Program Goal: Individual exercise prescription set using results from initial 6 min walk test and THRR while considering  patient's activity barriers and safety.   Exercise Prescription Goal: Initial exercise prescription builds to 30-45 minutes a day of aerobic activity, 2-3 days per week.  Home exercise guidelines will be given to patient during program as part of exercise prescription that the participant will acknowledge.  Activity Barriers & Risk Stratification:  Activity Barriers & Cardiac Risk Stratification - 10/11/19 1518      Activity Barriers & Cardiac Risk Stratification   Activity Barriers Shortness of Breath;Deconditioning;Back Problems    Cardiac Risk Stratification Moderate           6 Minute Walk:  6 Minute Walk    Row Name 10/11/19 1620         6 Minute Walk   Phase Initial     Distance 1034 feet     Walk Time 6 minutes     # of Rest Breaks 0     MPH 1.96     METS 3.59     RPE 15     Perceived Dyspnea  3     VO2 Peak 12.57     Symptoms Yes (comment)     Comments SOB and chest tightness     Resting HR 83 bpm     Resting BP 94/60     Resting Oxygen Saturation  96 %     Exercise Oxygen Saturation  during 6 min walk 95 %     Max Ex. HR 111 bpm     Max Ex. BP 110/70     2 Minute Post BP 102/70       Interval HR   1 Minute HR 100     2 Minute HR 105     3 Minute HR 98     4 Minute HR 110     5 Minute HR 111     6 Minute HR 108     2 Minute Post HR 83     Interval Heart Rate? Yes       Interval Oxygen   Interval Oxygen? Yes     Baseline Oxygen Saturation % 96 %     1 Minute Oxygen Saturation % 96 %     1 Minute Liters of Oxygen 0 L     2 Minute Oxygen Saturation % 96 %     2 Minute Liters of Oxygen  0 L     3 Minute Oxygen Saturation % 97 %     3 Minute Liters of Oxygen 0 L     4 Minute  Oxygen Saturation % 96 %     4 Minute Liters of Oxygen 0 L     5 Minute Oxygen Saturation % 95 %     5 Minute Liters of Oxygen 0 L     6 Minute Oxygen Saturation % 96 %     6 Minute Liters of Oxygen 0 L     2 Minute Post Oxygen Saturation % 98 %     2 Minute Post Liters of Oxygen 0 L            Oxygen Initial Assessment:  Oxygen Initial Assessment - 10/11/19 1619      Home Oxygen   Home Exercise Oxygen Prescription None    Home Resting Oxygen Prescription None    Compliance with Home Oxygen Use Yes      Initial 6 min Walk   Oxygen Used None      Program Oxygen Prescription   Program Oxygen Prescription None      Intervention   Short Term Goals To learn and understand importance of monitoring SPO2 with pulse oximeter and demonstrate accurate use of the pulse oximeter.;To learn and understand importance of maintaining oxygen saturations>88%;To learn and demonstrate proper pursed lip breathing techniques or other breathing techniques.;To learn and demonstrate proper use of respiratory medications    Long  Term Goals Verbalizes importance of monitoring SPO2 with pulse oximeter and return demonstration;Exhibits proper breathing techniques, such as pursed lip breathing or other method taught during program session;Maintenance of O2 saturations>88%;Compliance with respiratory medication;Demonstrates proper use of MDI's           Oxygen Re-Evaluation:  Oxygen Re-Evaluation    Row Name 11/01/19 0748             Program Oxygen Prescription   Program Oxygen Prescription None         Home Oxygen   Home Oxygen Device None       Sleep Oxygen Prescription None       Home Exercise Oxygen Prescription None       Home Resting Oxygen Prescription None       Compliance with Home Oxygen Use Yes         Goals/Expected Outcomes   Short Term Goals To learn and understand importance of monitoring SPO2 with pulse oximeter and demonstrate accurate use of the pulse oximeter.;To learn and  understand importance of maintaining oxygen saturations>88%;To learn and demonstrate proper pursed lip breathing techniques or other breathing techniques.;To learn and demonstrate proper use of respiratory medications       Long  Term Goals Verbalizes importance of monitoring SPO2 with pulse oximeter and return demonstration;Exhibits proper breathing techniques, such as pursed lip breathing or other method taught during program session;Maintenance of O2 saturations>88%;Compliance with respiratory medication;Demonstrates proper use of MDI's       Goals/Expected Outcomes compliance and understanding of oxygen saturation and pursed lip breathing              Oxygen Discharge (Final Oxygen Re-Evaluation):  Oxygen Re-Evaluation - 11/01/19 0748      Program Oxygen Prescription   Program Oxygen Prescription None      Home Oxygen   Home Oxygen Device None    Sleep Oxygen Prescription None    Home Exercise Oxygen Prescription None    Home Resting Oxygen Prescription None  Compliance with Home Oxygen Use Yes      Goals/Expected Outcomes   Short Term Goals To learn and understand importance of monitoring SPO2 with pulse oximeter and demonstrate accurate use of the pulse oximeter.;To learn and understand importance of maintaining oxygen saturations>88%;To learn and demonstrate proper pursed lip breathing techniques or other breathing techniques.;To learn and demonstrate proper use of respiratory medications    Long  Term Goals Verbalizes importance of monitoring SPO2 with pulse oximeter and return demonstration;Exhibits proper breathing techniques, such as pursed lip breathing or other method taught during program session;Maintenance of O2 saturations>88%;Compliance with respiratory medication;Demonstrates proper use of MDI's    Goals/Expected Outcomes compliance and understanding of oxygen saturation and pursed lip breathing           Initial Exercise Prescription:  Initial Exercise  Prescription - 10/12/19 1500      Date of Initial Exercise RX and Referring Provider   Date 10/11/19    Referring Provider Dr. Shan Levans    Expected Discharge Date 12/20/19      Recumbant Bike   Level 1.5    Minutes 15      NuStep   Level 2    Minutes 15      Prescription Details   Frequency (times per week) 2    Duration Progress to 30 minutes of continuous aerobic without signs/symptoms of physical distress      Intensity   THRR 40-80% of Max Heartrate 68-136    Ratings of Perceived Exertion 11-13    Perceived Dyspnea 0-4      Progression   Progression Continue to progress workloads to maintain intensity without signs/symptoms of physical distress.      Resistance Training   Training Prescription Yes    Weight blue bands    Reps 10-15           Perform Capillary Blood Glucose checks as needed.  Exercise Prescription Changes:  Exercise Prescription Changes    Row Name 11/01/19 0700             Response to Exercise   Blood Pressure (Admit) 108/62       Blood Pressure (Exercise) 102/68       Blood Pressure (Exit) 102/80       Heart Rate (Admit) 87 bpm       Heart Rate (Exercise) 97 bpm       Heart Rate (Exit) 90 bpm       Oxygen Saturation (Admit) 97 %       Oxygen Saturation (Exercise) 97 %       Oxygen Saturation (Exit) 97 %       Rating of Perceived Exertion (Exercise) 11       Perceived Dyspnea (Exercise) 2       Duration Continue with 30 min of aerobic exercise without signs/symptoms of physical distress.       Intensity Other (comment)  40%-80% HR Max         Progression   Progression Continue to progress workloads to maintain intensity without signs/symptoms of physical distress.       Average METs 1.6         Resistance Training   Training Prescription Yes       Weight blue bands       Reps 10-15       Time 10 Minutes         Recumbant Bike   Level 1.5       Minutes 15  NuStep   Level 2       Minutes 15       METs 1.6               Exercise Comments:  Exercise Comments    Row Name 10/18/19 1221           Exercise Comments Pt completed first day of exercise and tolerated with no complaints or concerns.              Exercise Goals and Review:  Exercise Goals    Row Name 10/11/19 1517 10/12/19 1540           Exercise Goals   Increase Physical Activity Yes Yes      Intervention Provide advice, education, support and counseling about physical activity/exercise needs.;Develop an individualized exercise prescription for aerobic and resistive training based on initial evaluation findings, risk stratification, comorbidities and participant's personal goals. Provide advice, education, support and counseling about physical activity/exercise needs.;Develop an individualized exercise prescription for aerobic and resistive training based on initial evaluation findings, risk stratification, comorbidities and participant's personal goals.      Expected Outcomes Short Term: Attend rehab on a regular basis to increase amount of physical activity.;Long Term: Exercising regularly at least 3-5 days a week.;Long Term: Add in home exercise to make exercise part of routine and to increase amount of physical activity. Short Term: Attend rehab on a regular basis to increase amount of physical activity.;Long Term: Add in home exercise to make exercise part of routine and to increase amount of physical activity.;Long Term: Exercising regularly at least 3-5 days a week.      Increase Strength and Stamina Yes Yes      Intervention Provide advice, education, support and counseling about physical activity/exercise needs.;Develop an individualized exercise prescription for aerobic and resistive training based on initial evaluation findings, risk stratification, comorbidities and participant's personal goals. Provide advice, education, support and counseling about physical activity/exercise needs.;Develop an individualized exercise  prescription for aerobic and resistive training based on initial evaluation findings, risk stratification, comorbidities and participant's personal goals.      Expected Outcomes Short Term: Increase workloads from initial exercise prescription for resistance, speed, and METs.;Short Term: Perform resistance training exercises routinely during rehab and add in resistance training at home;Long Term: Improve cardiorespiratory fitness, muscular endurance and strength as measured by increased METs and functional capacity ( ) Short Term: Increase workloads from initial exercise prescription for resistance, speed, and METs.;Short Term: Perform resistance training exercises routinely during rehab and add in resistance training at home;Long Term: Improve cardiorespiratory fitness, muscular endurance and strength as measured by increased METs and functional capacity ( )      Able to understand and use rate of perceived exertion (RPE) scale Yes Yes      Intervention Provide education and explanation on how to use RPE scale Provide education and explanation on how to use RPE scale      Expected Outcomes Short Term: Able to use RPE daily in rehab to express subjective intensity level;Long Term:  Able to use RPE to guide intensity level when exercising independently Short Term: Able to use RPE daily in rehab to express subjective intensity level;Long Term:  Able to use RPE to guide intensity level when exercising independently      Able to understand and use Dyspnea scale Yes Yes      Intervention Provide education and explanation on how to use Dyspnea scale Provide education and explanation on how to use Dyspnea scale  Expected Outcomes Short Term: Able to use Dyspnea scale daily in rehab to express subjective sense of shortness of breath during exertion;Long Term: Able to use Dyspnea scale to guide intensity level when exercising independently Short Term: Able to use Dyspnea scale daily in rehab to express  subjective sense of shortness of breath during exertion;Long Term: Able to use Dyspnea scale to guide intensity level when exercising independently      Knowledge and understanding of Target Heart Rate Range (THRR) Yes Yes      Intervention Provide education and explanation of THRR including how the numbers were predicted and where they are located for reference Provide education and explanation of THRR including how the numbers were predicted and where they are located for reference      Expected Outcomes Short Term: Able to state/look up THRR;Long Term: Able to use THRR to govern intensity when exercising independently;Short Term: Able to use daily as guideline for intensity in rehab Short Term: Able to state/look up THRR;Long Term: Able to use THRR to govern intensity when exercising independently;Short Term: Able to use daily as guideline for intensity in rehab      Understanding of Exercise Prescription Yes Yes      Intervention Provide education, explanation, and written materials on patient's individual exercise prescription Provide education, explanation, and written materials on patient's individual exercise prescription      Expected Outcomes Short Term: Able to explain program exercise prescription;Long Term: Able to explain home exercise prescription to exercise independently Short Term: Able to explain program exercise prescription;Long Term: Able to explain home exercise prescription to exercise independently             Exercise Goals Re-Evaluation :  Exercise Goals Re-Evaluation    Row Name 11/01/19 0746             Exercise Goal Re-Evaluation   Exercise Goals Review Increase Physical Activity;Increase Strength and Stamina;Able to understand and use rate of perceived exertion (RPE) scale;Able to understand and use Dyspnea scale;Knowledge and understanding of Target Heart Rate Range (THRR);Understanding of Exercise Prescription       Comments Pt has only completed 1 exercise  session and has had a few absences since then. Pt is deconditioned and needs a lot of one-on-one time. Will continue to monitor and progress as able.       Expected Outcomes Through exercise at rehab and home the patient will decrease shortness of breath with daily activities and feel confident in carrying out an exercise regimn at home.              Discharge Exercise Prescription (Final Exercise Prescription Changes):  Exercise Prescription Changes - 11/01/19 0700      Response to Exercise   Blood Pressure (Admit) 108/62    Blood Pressure (Exercise) 102/68    Blood Pressure (Exit) 102/80    Heart Rate (Admit) 87 bpm    Heart Rate (Exercise) 97 bpm    Heart Rate (Exit) 90 bpm    Oxygen Saturation (Admit) 97 %    Oxygen Saturation (Exercise) 97 %    Oxygen Saturation (Exit) 97 %    Rating of Perceived Exertion (Exercise) 11    Perceived Dyspnea (Exercise) 2    Duration Continue with 30 min of aerobic exercise without signs/symptoms of physical distress.    Intensity Other (comment)   40%-80% HR Max     Progression   Progression Continue to progress workloads to maintain intensity without signs/symptoms of physical distress.  Average METs 1.6      Resistance Training   Training Prescription Yes    Weight blue bands    Reps 10-15    Time 10 Minutes      Recumbant Bike   Level 1.5    Minutes 15      NuStep   Level 2    Minutes 15    METs 1.6           Nutrition:  Target Goals: Understanding of nutrition guidelines, daily intake of sodium 1500mg , cholesterol 200mg , calories 30% from fat and 7% or less from saturated fats, daily to have 5 or more servings of fruits and vegetables.  Biometrics:  Pre Biometrics - 10/11/19 1644      Pre Biometrics   Grip Strength 47 kg            Nutrition Therapy Plan and Nutrition Goals:  Nutrition Therapy & Goals - 11/01/19 0706      Nutrition Therapy   RD appointment deferred Yes           Nutrition  Assessments:   Nutrition Goals Re-Evaluation:   Nutrition Goals Discharge (Final Nutrition Goals Re-Evaluation):   Psychosocial: Target Goals: Acknowledge presence or absence of significant depression and/or stress, maximize coping skills, provide positive support system. Participant is able to verbalize types and ability to use techniques and skills needed for reducing stress and depression.  Initial Review & Psychosocial Screening:  Initial Psych Review & Screening - 10/11/19 1521      Initial Review   Current issues with History of Depression;Current Depression;Current Anxiety/Panic;Current Sleep Concerns;Current Stress Concerns    Source of Stress Concerns Chronic Illness;Financial;Retirement/disability;Unable to participate in former interests or hobbies;Unable to perform yard/household activities    Comments lives with girlfriend who is supportive      Family Dynamics   Good Support System? Yes    Concerns Inappropriate over/under dependence on family/friends      Barriers   Psychosocial barriers to participate in program The patient should benefit from training in stress management and relaxation.;Psychosocial barriers identified (see note)      Screening Interventions   Interventions To provide support and resources with identified psychosocial needs;Provide feedback about the scores to participant;Encouraged to exercise           Quality of Life Scores:  Scores of 19 and below usually indicate a poorer quality of life in these areas.  A difference of  2-3 points is a clinically meaningful difference.  A difference of 2-3 points in the total score of the Quality of Life Index has been associated with significant improvement in overall quality of life, self-image, physical symptoms, and general health in studies assessing change in quality of life.  PHQ-9: Recent Review Flowsheet Data    Depression screen [facility] 2/9 10/24/2019 10/12/2019 10/11/2019 07/31/2019 05/01/2019    Decreased Interest 2 - 2 0 3   Down, Depressed, Hopeless 3 - 2 2 3    PHQ - 2 Score 5 - 4 2 6    Altered sleeping 3 - 3 3 3    Tired, decreased energy 3 - 3 3 3    Change in appetite 2 - 3 0 2   Feeling bad or failure about yourself  3 - 3 3 3    Trouble concentrating 3 - 3 3 3    Moving slowly or fidgety/restless 2 - 3 1 3    Suicidal thoughts 0 - 0 0 0   PHQ-9 Score 21 - 22 15 23  Difficult doing work/chores - Very difficult Somewhat difficult - Extremely dIfficult     Interpretation of Total Score  Total Score Depression Severity:  1-4 = Minimal depression, 5-9 = Mild depression, 10-14 = Moderate depression, 15-19 = Moderately severe depression, 20-27 = Severe depression   Psychosocial Evaluation and Intervention:  Psychosocial Evaluation - 10/31/19 1501      Psychosocial Evaluation & Interventions   Expected Outcomes Richard Fuentes will report decrease in his PHQ9 scores along with positive and healthy stress managment           Psychosocial Re-Evaluation:  Psychosocial Re-Evaluation    Row Name 10/31/19 1459 10/31/19 1501           Psychosocial Re-Evaluation   Current issues with Current Stress Concerns;Current Sleep Concerns;Current Anxiety/Panic;Current Depression --      Comments Richard Fuentes has multiple mental health issues without the benefit of medication or counseling support.  Pt feels he"manages" this well.  PCP notified of significant higher PHQ9 scores - 21 Richard Fuentes has multiple mental health issues  anxiety, depresion and OCDwithout the benefit of medication or counseling support.  Pt feels he"manages" this well and the medications he was once prescribed caused his lung issues.  PCP notified of significant higher PHQ9 scores - 21 Richard Fuentes has multiple stessors with chronic illness - not ready for transplant /lung: financial - no income and very limited activity due to his lung disease.      Expected Outcomes -- Richard Fuentes will report decrease in his PHQ9 scores along with positive and healthy stress  managment      Interventions -- Stress management education;Encouraged to attend Pulmonary Rehabilitation for the exercise;Relaxation education;Physician referral      Continue Psychosocial Services  -- Follow up required by staff             Psychosocial Discharge (Final Psychosocial Re-Evaluation):  Psychosocial Re-Evaluation - 10/31/19 1501      Psychosocial Re-Evaluation   Comments Richard Fuentes has multiple mental health issues  anxiety, depresion and OCDwithout the benefit of medication or counseling support.  Pt feels he"manages" this well and the medications he was once prescribed caused his lung issues.  PCP notified of significant higher PHQ9 scores - 21 Richard Fuentes has multiple stessors with chronic illness - not ready for transplant /lung: financial - no income and very limited activity due to his lung disease.    Expected Outcomes Richard Fuentes will report decrease in his PHQ9 scores along with positive and healthy stress managment    Interventions Stress management education;Encouraged to attend Pulmonary Rehabilitation for the exercise;Relaxation education;Physician referral    Continue Psychosocial Services  Follow up required by staff           Education: Education Goals: Education classes will be provided on a weekly basis, covering required topics. Participant will state understanding/return demonstration of topics presented.  Learning Barriers/Preferences:  Learning Barriers/Preferences - 10/11/19 1526      Learning Barriers/Preferences   Learning Barriers Sight    Learning Preferences Computer/Internet;Individual Instruction;Video           Education Topics: Risk Factor Reduction:  -Group instruction that is supported by a PowerPoint presentation. Instructor discusses the definition of a risk factor, different risk factors for pulmonary disease, and how the heart and lungs work together.     Nutrition for Pulmonary Patient:  -Group instruction provided by PowerPoint slides, verbal  discussion, and written materials to support subject matter. The instructor gives an explanation and review of healthy diet recommendations, which includes a discussion  on weight management, recommendations for fruit and vegetable consumption, as well as protein, fluid, caffeine, fiber, sodium, sugar, and alcohol. Tips for eating when patients are short of breath are discussed.   Pursed Lip Breathing:  -Group instruction that is supported by demonstration and informational handouts. Instructor discusses the benefits of pursed lip and diaphragmatic breathing and detailed demonstration on how to preform both.     [facility]  Date 10/18/19  Educator Handout  Instruction Review Code 1- Verbalizes Understanding      Oxygen Safety:  -Group instruction provided by PowerPoint, verbal discussion, and written material to support subject matter. There is an overview of "What is Oxygen" and "Why do we need it".  Instructor also reviews how to create a safe environment for oxygen use, the importance of using oxygen as prescribed, and the risks of noncompliance. There is a brief discussion on traveling with oxygen and resources the patient may utilize.   Oxygen Equipment:  -Group instruction provided by [facility] utilizing handouts, written materials, and equipment demonstrations.   Signs and Symptoms:  -Group instruction provided by written material and verbal discussion to support subject matter. Warning signs and symptoms of infection, stroke, and heart attack are reviewed and when to call the physician/911 reinforced. Tips for preventing the spread of infection discussed.   Advanced Directives:  -Group instruction provided by verbal instruction and written material to support subject matter. Instructor reviews Advanced Directive laws and proper instruction for filling out document.   Pulmonary Video:   -Group video education that reviews the importance of medication and oxygen compliance, exercise, good nutrition, pulmonary hygiene, and pursed lip and diaphragmatic breathing for the pulmonary patient.   Exercise for the Pulmonary Patient:  -Group instruction that is supported by a PowerPoint presentation. Instructor discusses benefits of exercise, core components of exercise, frequency, duration, and intensity of an exercise routine, importance of utilizing pulse oximetry during exercise, safety while exercising, and options of places to exercise outside of rehab.     Pulmonary Medications:  -Verbally interactive group education provided by instructor with focus on inhaled medications and proper administration.   Anatomy and Physiology of the Respiratory System and Intimacy:  -Group instruction provided by PowerPoint, verbal discussion, and written material to support subject matter. Instructor reviews respiratory cycle and anatomical components of the respiratory system and their functions. Instructor also reviews differences in obstructive and restrictive respiratory diseases with examples of each. Intimacy, Sex, and Sexuality differences are reviewed with a discussion on how relationships can change when diagnosed with pulmonary disease. Common sexual concerns are reviewed.   MD DAY -A group question and answer session with a medical doctor that allows participants to ask questions that relate to their pulmonary disease state.   OTHER EDUCATION -Group or individual verbal, written, or video instructions that support the educational goals of the pulmonary rehab program.   Holiday Eating Survival Tips:  -Group instruction provided by PowerPoint slides, verbal discussion, and written materials to support subject matter. The instructor gives patients tips, tricks, and techniques to help them not only survive but enjoy the holidays despite the onslaught of food that accompanies the  holidays.   Knowledge Questionnaire Score:  Knowledge Questionnaire Score - 10/11/19 1642      Knowledge Questionnaire Score   Pre Score 15/18           Core Components/Risk Factors/Patient Goals at Admission:  Personal Goals and Risk Factors at  Admission - 10/11/19 1527      Core Components/Risk Factors/Patient Goals on Admission   Improve shortness of breath with ADL's Yes    Intervention Provide education, individualized exercise plan and daily activity instruction to help decrease symptoms of SOB with activities of daily living.    Expected Outcomes Short Term: Improve cardiorespiratory fitness to achieve a reduction of symptoms when performing ADLs;Long Term: Be able to perform more ADLs without symptoms or delay the onset of symptoms    Stress Yes    Intervention Offer individual and/or small group education and counseling on adjustment to heart disease, stress management and health-related lifestyle change. Teach and support self-help strategies.;Refer participants experiencing significant psychosocial distress to appropriate mental health specialists for further evaluation and treatment. When possible, include family members and significant others in education/counseling sessions.    Expected Outcomes Short Term: Participant demonstrates changes in health-related behavior, relaxation and other stress management skills, ability to obtain effective social support, and compliance with psychotropic medications if prescribed.;Long Term: Emotional wellbeing is indicated by absence of clinically significant psychosocial distress or social isolation.           Core Components/Risk Factors/Patient Goals Review:   Goals and Risk Factor Review    Row Name 10/31/19 1504             Core Components/Risk Factors/Patient Goals Review   Personal Goals Review Improve shortness of breath with ADL's;Develop more efficient breathing techniques such as purse lipped breathing and diaphragmatic  breathing and practicing self-pacing with activity.;Increase knowledge of respiratory medications and ability to use respiratory devices properly.;Stress       Review Richard Fuentes has completed 1 exercise session on 10/12.  Out the second week due to "bad breathing week"  pt had hearing on yesterday to see if Medicaid will reverse the decision not to pay for his procedure Zephyr valve placement at [facility] to improve his lung capacity.  His inability to work, no income has caused hi increased stress which impacts his breathing.  Not enough data to review  for progress       Expected Outcomes See Admission Goals              Core Components/Risk Factors/Patient Goals at Discharge (Final Review):   Goals and Risk Factor Review - 10/31/19 1504      Core Components/Risk Factors/Patient Goals Review   Personal Goals Review Improve shortness of breath with ADL's;Develop more efficient breathing techniques such as purse lipped breathing and diaphragmatic breathing and practicing self-pacing with activity.;Increase knowledge of respiratory medications and ability to use respiratory devices properly.;Stress    Review Richard Fuentes has completed 1 exercise session on 10/12.  Out the second week due to "bad breathing week"  pt had hearing on yesterday to see if Medicaid will reverse the decision not to pay for his procedure Zephyr valve placement at [facility] to improve his lung capacity.  His inability to work, no income has caused hi increased stress which impacts his breathing.  Not enough data to review  for progress    Expected Outcomes See Admission Goals           ITP Comments:   Comments:  Richard Fuentes has completed 1 exercise session in Pulmonary rehab.Pulmonary rehab staff will  continue to monitor and reassess progress toward goals during her participation in Pulmonary Rehab. [name], BSN Cardiac and Emergency planning/management officer

## 2019-11-01 NOTE — Telephone Encounter (Signed)
[name] called with complaint of fever of 102.  Richard Fuentes live in girlfriend coworker tested positive for Covid.  Advised pt that he should get tested.  [name] indicated that he was planning to go to [facility] for the rapid test. Encourage pt to stay hydrated and to keep [place] updated on his test results.[name], BSN Cardiac and Emergency planning/management officer

## 2019-11-06 ENCOUNTER — Ambulatory Visit (HOSPITAL_COMMUNITY): Payer: Medicaid Other

## 2019-11-06 DIAGNOSIS — L218 Other seborrheic dermatitis: Secondary | ICD-10-CM | POA: Diagnosis not present

## 2019-11-06 DIAGNOSIS — L400 Psoriasis vulgaris: Secondary | ICD-10-CM | POA: Diagnosis not present

## 2019-11-07 ENCOUNTER — Ambulatory Visit (HOSPITAL_COMMUNITY): Payer: Medicaid Other

## 2019-11-08 ENCOUNTER — Ambulatory Visit (HOSPITAL_COMMUNITY): Payer: Medicaid Other

## 2019-11-08 ENCOUNTER — Telehealth (HOSPITAL_COMMUNITY): Payer: Self-pay | Admitting: *Deleted

## 2019-11-08 ENCOUNTER — Telehealth: Payer: Self-pay

## 2019-11-08 NOTE — Telephone Encounter (Signed)
Call placed to [facility] to check on status of CPAP order.  Spoke to [place] who stated that they spoke to the patient yesterdya and informed him that it would be approximately 8-12 weeks until he receives his CPAP machine due to a Scientist, clinical (histocompatibility and immunogenetics) of machines

## 2019-11-08 NOTE — Telephone Encounter (Signed)
Received call from Richard Fuentes at 1058 inquiring where to park for pulmonary rehab for his arrival at 10:45 exercises at 11. At first it appeared as though he was on the hospital campus and needed guidance to the parking garage closest to [address].  Upon further questioning, pt [address] actually [address].  Advised pt that he would not be able to arrive by the allotted time to exercise today.  Upon further discussion with pt found out he had not gotten tested for covid based upon last week conversation. According to the pt he tried three different times to get tested but was turned away for a variety of reasons ie name not on the list, person already gone for the day, no test kits available. Richard Fuentes admits that he feels better and has been afebrile for 3 days.  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 Offered to locate a [facility] facility for testing. Reiterated to pt that he must be tested prior to discussion of when he may return to pulmonary rehab  because of the feverand chills he reported and exposure to person who tested positive for covid. Nyko verbalized that he understood. [name], BSN Cardiac and Emergency planning/management officer

## 2019-11-08 NOTE — Telephone Encounter (Signed)
Called and spoke to [place] with information on where [facility] was providing testing sites.  Gave him the phone number [phone] to call and schedule an appointment. Told him that [facility] on [address] has a testing clinic from 5:30-7:15.  This is the closest to his home ( he does not drive) thinks he can get someone to bring him.  Reminded [place] to please keep me updated so we can plan next steps. [name], BSN Cardiac and Emergency planning/management officer

## 2019-11-09 NOTE — Telephone Encounter (Signed)
Wow, supply chain issues hit cpaps

## 2019-11-12 ENCOUNTER — Other Ambulatory Visit: Payer: Self-pay

## 2019-11-12 ENCOUNTER — Encounter: Payer: Self-pay | Admitting: Cardiology

## 2019-11-12 ENCOUNTER — Ambulatory Visit (INDEPENDENT_AMBULATORY_CARE_PROVIDER_SITE_OTHER): Payer: Medicaid Other | Admitting: Cardiology

## 2019-11-12 VITALS — BP 118/78 | HR 84 | Ht 72.0 in | Wt 197.4 lb

## 2019-11-12 DIAGNOSIS — E785 Hyperlipidemia, unspecified: Secondary | ICD-10-CM | POA: Diagnosis not present

## 2019-11-12 DIAGNOSIS — Z8249 Family history of ischemic heart disease and other diseases of the circulatory system: Secondary | ICD-10-CM

## 2019-11-12 DIAGNOSIS — Z01810 Encounter for preprocedural cardiovascular examination: Secondary | ICD-10-CM | POA: Diagnosis not present

## 2019-11-12 NOTE — Progress Notes (Signed)
Primary Care Provider: Claiborne Rigg, NP Cardiologist: No primary care provider on file.  Pulmonologist: Dr. Shan Levans Lung transplant team: [facility] Electrophysiologist: None  Clinic Note: Chief Complaint  Patient presents with  . Follow-up    Only got a coronary calcium score not CTA  . Shortness of Breath    Most likely with severe COPD.   HPI:    Richard Fuentes is a 49 y.o. 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 Delford Field, his note indicates that the patient had been treated for chronic asthma since 2013, but had not seen pulmonologist until this year.--> Richard Fuentes has been referred to [facility] transplant clinic, and actually has upcoming bronchoscopy with potential Zephyr valve placement for nonsurgical lung decompression of the upper lobes.  As part of his preoperative evaluation Richard Fuentes was referred for cardiology evaluation. ER visit February 24, 2019-had worsening dyspnea since being tested positive for COVID-19 on January 27, 2019.  I saw him for initial consultation on August 20, 2019, and was somewhat confused at the request of nose being asked of me.  Richard Fuentes does have a family history of CAD but Richard Fuentes himself has severe end-stage COPD.  As part of possible preop evaluation for his Zephyr Valve pulmonary reduction procedure,   I ordered a coronary CTA:  Unfortunately, Richard Fuentes was not able to go through with the study because his blood pressure dropped dramatically with a dose of metoprolol to the point where his procedure was canceled.  Richard Fuentes was actually monitored for quite some time while in radiology.  Richard Fuentes was finally discharged home once the blood pressure level stayed stable.  Richard Fuentes told me that his pressures  remained low overnight and Richard Fuentes was extremely weak.  Echocardiogram was not ordered, because it was ostensibly ordered to be done at [facility] prior to his procedure.  Recent Hospitalizations:   None  Reviewed  CV studies:    The following studies were reviewed today: (if available, images/films reviewed: From Epic Chart or Care Everywhere) . CT chest April 2021: No acute cardiopulmonary disease.  Severe centrilobular and paraseptal emphysematous changes upper lung predominant. . Echocardiogram has been ordered by [facility] transplant team. . Coronary calcium score-4.  Calcium noted in proximal LAD only.  Interval History:   Richard Fuentes is here for follow-up evaluation indicating that Richard Fuentes is still having much of any chest discomfort.  The only symptom Richard Fuentes notes is tightness in his chest from having difficulty breathing.Richard Fuentes still denies any chest pain or pressure just tightness when Richard Fuentes has a hard time breathing.  Richard Fuentes himself is relatively reluctant to do much of a cardiac evaluation.  I did not order an echocardiogram, because according to his notes from [facility] pulmonary medicine, the plan was for them to do an echocardiogram there.  I do not want him to have double studies.  This was not performed, because the Zephyr Valve procedure was not done because it was not approved by his Medicaid coverage--which amazingly would have approved lung transplant.  CV Review of Symptoms (Summary): positive for - dyspnea on exertion, orthopnea, rapid heart rate, shortness of breath and Richard Fuentes is always attributed mostly symptoms to COPD including some chest tightness when Richard Fuentes is having difficulty catching his breath. negative for -  edema or Richard Fuentes has lightheaded on occasion, has not had any syncope or near syncope.  The closest Richard Fuentes came to syncope, was after getting the metoprolol for his CT scan.  No TIA or amaurosis fugax.  The patient DOES NOT currently have symptoms concerning for COVID-19 infection (fever, chills, cough, or new  shortness of breath).  Richard Fuentes has chronic dyspnea with cough The patient is practicing social distancing & Masking.   Developed COVID-19 infection in January 21-has been troubled with chronic fatigue since then.  REVIEWED OF SYSTEMS   Review of Systems  Constitutional: Positive for malaise/fatigue. Negative for weight loss.  HENT: Positive for congestion and sore throat. Negative for nosebleeds.   Respiratory: Positive for cough, shortness of breath and wheezing. Negative for hemoptysis and sputum production (Not lately).   Cardiovascular: Negative for leg swelling.  Gastrointestinal: Negative for blood in stool and melena.  Genitourinary: Negative for hematuria.  Musculoskeletal: Positive for back pain and joint pain.  Neurological: Positive for dizziness. Negative for focal weakness.  Psychiatric/Behavioral: Negative for memory loss. The patient is nervous/anxious (For obvious reasons) and has insomnia.        Diagnosed with OCD   I have reviewed and (if needed) personally updated the patient's problem list, medications, allergies, past medical and surgical history, social and family history.   PAST MEDICAL HISTORY   Past Medical History:  Diagnosis Date  . Anxiety   . Asthma   . COPD (chronic obstructive pulmonary disease) (HCC)    Severe centrilobular and paraseptal bullous emphysema; Gold stage D; FEV1 and DLCO<30%.;  Currently undergoing transplant evaluation  . Environmental allergies   . Esophagitis   . Hx of migraines   . OCD (obsessive compulsive disorder)   . Psoriasis   . Urticaria     PAST SURGICAL HISTORY   Past Surgical History:  Procedure Laterality Date  . KNEE SURGERY      MEDICATIONS/ALLERGIES   Current Meds  Medication Sig  . albuterol (VENTOLIN HFA) 108 (90 Base) MCG/ACT inhaler Inhale 2 puffs into the lungs every 6 (six) hours as needed for wheezing or shortness of breath.  . fluticasone (FLONASE) 50 MCG/ACT nasal spray Place 2 sprays into both  nostrils daily as needed for allergies or rhinitis.  . Fluticasone-Umeclidin-Vilant (TRELEGY ELLIPTA) 200-62.5-25 MCG/INH AEPB Inhale 1 puff into the lungs daily.  Marland Kitchen ipratropium-albuterol (DUONEB) 0.5-2.5 (3) MG/3ML SOLN Inhale into the lungs.  Marland Kitchen levocetirizine (XYZAL) 5 MG tablet Take 1 tablet once daily as needed for runny nose or itchy eyes.  . Vitamin D, Ergocalciferol, (DRISDOL) 1.25 MG (50000 UNIT) CAPS capsule Take 1 capsule (50,000 Units total) by mouth every 7 (seven) days.    Allergies  Allergen Reactions  . Other Anaphylaxis    Wool  . Penicillins Other (See Comments)    Childhood allergy  Has patient had a PCN reaction causing immediate rash, facial/tongue/throat swelling, SOB or lightheadedness with hypotension: n/a Has patient had a PCN reaction causing severe rash involving mucus membranes or skin necrosis: n/a Has patient had a PCN reaction that required hospitalization: n/a Has patient had a PCN reaction occurring within the last 10 years: n/a If all of the above answers are "NO", then may proceed with Cephalosporin use.   Marland Kitchen Shellfish Allergy     SOCIAL HISTORY/FAMILY HISTORY   Social History   Tobacco Use  . Smoking status: Former Smoker    Packs/day: 1.00    Years: 30.00    Pack years: 30.00  Types: Cigarettes    Quit date: 01/19/2018    Years since quitting: 1.8  . Smokeless tobacco: Never Used  Vaping Use  . Vaping Use: Never used  Substance Use Topics  . Alcohol use: No  . Drug use: No   Social History   Social History Narrative  . Not on file   family history includes CAD in his father and maternal uncle; Heart attack in his maternal uncle and mother; Heart attack (age of onset: 10) in his cousin; Heart attack (age of onset: 34) in his paternal grandfather; Heart attack (age of onset: 60) in his father; Multiple sclerosis in his sister; Psoriasis in his maternal grandmother; Sudden Cardiac Death (age of onset: 10) in his cousin; Sudden Cardiac  Death (age of onset: 44) in his paternal grandfather.   OBJCTIVE -PE, EKG, labs   Wt Readings from Last 3 Encounters:  11/12/19 197 lb 6.4 oz (89.5 kg)  10/24/19 194 lb 12.8 oz (88.4 kg)  08/20/19 191 lb (86.6 kg)    Physical Exam: BP 118/78   Pulse 84   Ht 6' (1.829 m)   Wt 197 lb 6.4 oz (89.5 kg)   SpO2 97%   BMI 26.77 kg/m  Physical Exam Vitals reviewed.  Constitutional:      General: Richard Fuentes is not in acute distress.    Appearance: Richard Fuentes is obese. Richard Fuentes is ill-appearing (Chronically ill-appearing).     Comments: Well-groomed.  HENT:     Head: Normocephalic and atraumatic.  Neck:     Vascular: No carotid bruit.  Cardiovascular:     Rate and Rhythm: Normal rate and regular rhythm. Occasional extrasystoles are present.    Chest Wall: PMI is displaced (Low and midline).     Pulses: Normal pulses.     Heart sounds: S2 normal. Heart sounds are distant. No murmur heard.  No friction rub. No gallop.   Pulmonary:     Effort: Accessory muscle usage and prolonged expiration present. No respiratory distress (Not in extreme distress, but likely chronically labored).     Breath sounds: Decreased air movement present. Examination of the right-upper field reveals decreased breath sounds. Examination of the left-upper field reveals decreased breath sounds. Decreased breath sounds, wheezing (Bilaterally, throughout) and rhonchi (Mild) present. No rales.  Musculoskeletal:        General: No swelling. Normal range of motion.     Cervical back: Normal range of motion.  Neurological:     General: No focal deficit present.     Mental Status: Richard Fuentes is alert and oriented to person, place, and time.  Psychiatric:        Mood and Affect: Mood normal.        Behavior: Behavior normal.        Thought Content: Thought content normal.        Judgment: Judgment normal.     Comments: Richard Fuentes is a little bit anxious and fidgety.      Adult ECG Report Not checked  Recent Labs:    Lab Results  Component Value  Date   CHOL 240 (H) 12/07/2018   HDL 50 12/07/2018   LDLCALC 165 (H) 12/07/2018   TRIG 139 12/07/2018   CHOLHDL 4.8 12/07/2018   Lab Results  Component Value Date   CREATININE 1.07 10/04/2019   BUN 16 10/04/2019   NA 140 10/04/2019   K 4.6 10/04/2019   CL 100 10/04/2019   CO2 25 10/04/2019   Lab Results  Component Value Date   TSH  0.821 04/28/2010    ASSESSMENT/PLAN   Problem List Items Addressed This Visit    Hyperlipidemia with target LDL less than 100 (Chronic)    Coronary calcium score was only 4, this is extremely low risk. This is contrasted by his significant elevated lipid levels.  I talked about potentially considering a statin, and Richard Fuentes was not overly excited at the idea of taking medicine that may make him worse that Richard Fuentes already does.  We can discuss again when Richard Fuentes comes back for follow-up.  For now Richard Fuentes wants to try dietary adjustment and over-the-counter remedies such as red yeast rice and co-Q10..      Relevant Orders   Lipid panel   Hepatic function panel   Family history of premature coronary artery disease - Primary (Chronic)    Coronary CTA was not able to be done, but coronary calcium score is extremely low, which would argue against significant CAD.  Do think that is reasonable to treat cardiac risk factors include lipids, but as noted, Richard Fuentes would like to avoid additional therapies and treatments.      Relevant Orders   Lipid panel   Hepatic function panel   Preoperative cardiovascular examination (Chronic)    Preop exam was for not because Richard Fuentes did not have his effort valves scheduled because of lack of coverage.  Richard Fuentes himself says that Richard Fuentes is going to try to get the funding to have the procedure.  If that is the case, that Richard Fuentes would also have an echocardiogram done.  Pending those results, may be beneficial consider right heart catheterization as part of monitoring during this procedure.  At this point I do not think Richard Fuentes wants to order any other studies or do any  further evaluation until Richard Fuentes knows was going on.  If Richard Fuentes could be on an echocardiogram at [facility], I do not think it makes any sense for [place] to return here.  Clearly, Richard Fuentes would not be a candidate for coronary CTA based on what happened last time.  Richard Fuentes also would not be ready candidate for [facility] with his baseline wheezing..  For now, we will hold off with any further testing locally, and I will see him back in 6 months to discuss plans at that point.Marland Kitchen        COVID-19 Education: The signs and symptoms of COVID-19 were discussed with the patient and how to seek care for testing (follow up with PCP or arrange E-visit).   The importance of social distancing and COVID-19 vaccination was discussed today.  I spent a total of 24 minutes with the patient. >  50% of the time was spent in direct patient consultation.  Additional time spent with chart review  / charting (studies, outside notes, etc): 10 Total Time: 34 min  Current medicines are reviewed at length with the patient today.  (+/- concerns) N/A  Notice: This dictation was prepared with Dragon dictation along with smaller phrase technology. Any transcriptional errors that result from this process are unintentional and may not be corrected upon review.  Patient Instructions / Medication Changes & Studies & Tests Ordered   Patient Instructions  Medication Instructions:  No changes  *If you need a refill on your cardiac medications before your next appointment, please call your pharmacy*   Lab Work: Lipid Hepatic --- fasting  If you have labs (blood work) drawn today and your tests are completely normal, you will receive your results only by: Marland Kitchen MyChart Message (if you have MyChart) OR .  A paper copy in the mail If you have any lab test that is abnormal or we need to change your treatment, we will call you to review the results.   Testing/Procedures:  Not needed  Follow-Up: At [facility], you and your health needs are our  priority.  As part of our continuing mission to provide you with exceptional heart care, we have created designated Provider Care Teams.  These Care Teams include your primary Cardiologist (physician) and Advanced Practice Providers (APPs -  Physician Assistants and Nurse Practitioners) who all work together to provide you with the care you need, when you need it.     Your next appointment:   6 month(s)  The format for your next appointment:   In Person  Provider:   Bryan Lemmaavid Barbaraann Avans, MD    Studies Ordered:   Orders Placed This Encounter  Procedures  . Lipid panel  . Hepatic function panel     Bryan Lemmaavid Darcella Shiffman, M.D., M.S. Interventional Cardiologist   Pager # [phone] Phone # [phone] [address] [place], [place] [address]   Thank you for choosing [facility] at [facility]!!

## 2019-11-12 NOTE — Patient Instructions (Signed)
Medication Instructions:  No changes  *If you need a refill on your cardiac medications before your next appointment, please call your pharmacy*   Lab Work: Lipid Hepatic --- fasting  If you have labs (blood work) drawn today and your tests are completely normal, you will receive your results only by: Marland Kitchen MyChart Message (if you have MyChart) OR . A paper copy in the mail If you have any lab test that is abnormal or we need to change your treatment, we will call you to review the results.   Testing/Procedures:  Not needed  Follow-Up: At [facility], you and your health needs are our priority.  As part of our continuing mission to provide you with exceptional heart care, we have created designated Provider Care Teams.  These Care Teams include your primary Cardiologist (physician) and Advanced Practice Providers (APPs -  Physician Assistants and Nurse Practitioners) who all work together to provide you with the care you need, when you need it.     Your next appointment:   6 month(s)  The format for your next appointment:   In Person  Provider:   Bryan Lemma, MD

## 2019-11-13 ENCOUNTER — Telehealth (HOSPITAL_COMMUNITY): Payer: Self-pay

## 2019-11-13 ENCOUNTER — Encounter (HOSPITAL_COMMUNITY)
Admission: RE | Admit: 2019-11-13 | Discharge: 2019-11-13 | Disposition: A | Discharge status: Home / Self Care | Payer: Medicaid Other | Source: Ambulatory Visit | Attending: Critical Care Medicine | Admitting: Critical Care Medicine

## 2019-11-13 ENCOUNTER — Encounter (HOSPITAL_COMMUNITY): Payer: Medicaid Other

## 2019-11-13 DIAGNOSIS — J432 Centrilobular emphysema: Secondary | ICD-10-CM | POA: Diagnosis not present

## 2019-11-13 NOTE — Progress Notes (Signed)
Daily Session Note  Patient Details  Name: Richard Fuentes MRN: [id number] Date of Birth: Aug 05, 1970 Referring Provider:     Pulmonary Rehab Walk Test from 10/11/2019 in [place]  Referring Provider Dr. Asencion Noble      Encounter Date: 11/13/2019  Check In:  Session Check In - 11/13/19 1104      Check-In   Supervising physician immediately available to respond to emergencies [facility] immediately available    Physician(s) Dr. Verlon Au    Location [facility]    Staff Present Rosebud Poles, RN, Isaac Laud, MS, ACSM-CEP, Exercise Physiologist;[name], RN    Virtual Visit No    Medication changes reported     No    Fall or balance concerns reported    No    Tobacco Cessation No Change    Warm-up and Cool-down Performed as group-led instruction    Resistance Training Performed Yes    VAD Patient? No    PAD/SET Patient? No      Pain Assessment   Currently in Pain? No/denies    Multiple Pain Sites No           Capillary Blood Glucose: No results found for this or any previous visit (from the past 24 hour(s)).    Social History   Tobacco Use  Smoking Status Former Smoker  . Packs/day: 1.00  . Years: 30.00  . Pack years: 30.00  . Types: Cigarettes  . Quit date: 01/19/2018  . Years since quitting: 1.8  Smokeless Tobacco Never Used    Goals Met:  Proper associated with RPD/PD & O2 Sat Exercise tolerated well Strength training completed today  Goals Unmet:  Not Applicable  Comments: Service time is from 1050 to 1150    Dr. Fransico Him is Medical Director for Cardiac Rehab at [facility].

## 2019-11-13 NOTE — Telephone Encounter (Signed)
Called pt to discuss if he was returning to pulmonary rehab today. Pt did not answer and does not have a voicemail set up. Will call at a later time to see if pt plans on returning to rehab.

## 2019-11-15 ENCOUNTER — Other Ambulatory Visit: Payer: Self-pay

## 2019-11-15 ENCOUNTER — Encounter (HOSPITAL_COMMUNITY)
Admission: RE | Admit: 2019-11-15 | Discharge: 2019-11-15 | Disposition: A | Discharge status: Home / Self Care | Payer: Medicaid Other | Source: Ambulatory Visit | Attending: Critical Care Medicine | Admitting: Critical Care Medicine

## 2019-11-15 ENCOUNTER — Encounter (HOSPITAL_COMMUNITY): Payer: Medicaid Other

## 2019-11-15 DIAGNOSIS — J432 Centrilobular emphysema: Secondary | ICD-10-CM

## 2019-11-15 NOTE — Progress Notes (Signed)
Richard Fuentes 49 y.o. male Nutrition Note Visit Diagnosis: Centriacinar emphysema [facility])   Past Medical History:  Diagnosis Date  . Anxiety   . Asthma   . COPD (chronic obstructive pulmonary disease) (HCC)    Severe centrilobular and paraseptal bullous emphysema; Gold stage D; FEV1 and DLCO<30%.;  Currently undergoing transplant evaluation  . Environmental allergies   . Esophagitis   . Hx of migraines   . OCD (obsessive compulsive disorder)   . Psoriasis   . Urticaria      Medications reviewed.   Current Outpatient Medications:  .  albuterol (VENTOLIN HFA) 108 (90 Base) MCG/ACT inhaler, Inhale 2 puffs into the lungs every 6 (six) hours as needed for wheezing or shortness of breath., Disp: 19 g, Rfl: 1 .  fluticasone (FLONASE) 50 MCG/ACT nasal spray, Place 2 sprays into both nostrils daily as needed for allergies or rhinitis., Disp: 18.2 mL, Rfl: 5 .  Fluticasone-Umeclidin-Vilant (TRELEGY ELLIPTA) 200-62.5-25 MCG/INH AEPB, Inhale 1 puff into the lungs daily., Disp: 60 each, Rfl: 3 .  ipratropium-albuterol (DUONEB) 0.5-2.5 (3) MG/3ML SOLN, Inhale into the lungs., Disp: , Rfl:  .  levocetirizine (XYZAL) 5 MG tablet, Take 1 tablet once daily as needed for runny nose or itchy eyes., Disp: 34 tablet, Rfl: 5 .  Vitamin D, Ergocalciferol, (DRISDOL) 1.25 MG (50000 UNIT) CAPS capsule, Take 1 capsule (50,000 Units total) by mouth every 7 (seven) days., Disp: 12 capsule, Rfl: 1   Ht Readings from Last 1 Encounters:  11/12/19 6' (1.829 m)     Wt Readings from Last 3 Encounters:  11/12/19 197 lb 6.4 oz (89.5 kg)  10/24/19 194 lb 12.8 oz (88.4 kg)  08/20/19 191 lb (86.6 kg)     There is no height or weight on file to calculate BMI.   Social History   Tobacco Use  Smoking Status Former Smoker  . Packs/day: 1.00  . Years: 30.00  . Pack years: 30.00  . Types: Cigarettes  . Quit date: 01/19/2018  . Years since quitting: 1.8  Smokeless Tobacco Never Used      Nutrition  Note  Spoke with pt. Nutrition Plan and Nutrition Survey goals reviewed with pt.   Pt has Pre-diabetes. Last A1c 5.8. Pt states he is concerned about this but has little energy to make diet changes. Recommended staying up to date with yearly A1C labs and walking/exercise as tolerated.   Acid reflux: Yes - recommended addressing with MD. He reports symptoms all the time. He does not lie down after eating, eats small meals, and tries to avoid trigger foods. He drinks Coke with his meals. Recommended avoiding carbonate beverages with meals.  Appetite: Ok  Unintentional weight loss: none Difficulty eating: Difficulty breathing when he eats.  Grocery shopping: his girlfriend helps and he uses a grocery cart to lean on when he's out of breath. Reviewed strategies to make it easier to grocery shop. Protein: unable to assess as pt states such variability with his diet. Fluids: 2 12 oz cans coke, 2-3 16 oz bottles water Meals per day: <2 Mucus production at night - has a difficult time coughing it up. Encouraged pt to increase water intake.  He notices more SOB when he eats high sodium foods. He has eliminated frozen meals for this reason. He does not salt foods. He eats out 2-3 times per week.  Pt expressed understanding of the information reviewed.   Nutrition Diagnosis ? Inadequate oral intake related to breathing difficulties as evidenced by patient report  Nutrition Intervention ? Pt's individual nutrition plan reviewed with pt. ? Benefits of adopting healthy diet reviewed with Rate My Plate survey ? Continue client-centered nutrition education by RD, as part of interdisciplinary care.  Goal(s) ? Pt to build a healthy plate including vegetables, fruits, whole grains, and low-fat dairy products in a heart healthy meal plan. ? Pt to eat meals when he feels the best ? Pt to increase water intake to minimum of 64 oz (from 32-48 oz per day) Plan:   Will provide client-centered nutrition  education as part of interdisciplinary care  Monitor and evaluate progress toward nutrition goal with team.   Andrey Campanile, MS, RDN, LDN

## 2019-11-15 NOTE — Progress Notes (Signed)
Daily Session Note  Patient Details  Name: Richard Fuentes MRN: [id number] Date of Birth: 1970/03/30 Referring Provider:     Pulmonary Rehab Walk Test from 10/11/2019 in [place]  Referring Provider Dr. Asencion Noble      Encounter Date: 11/15/2019  Check In:  Session Check In - 11/15/19 1111      Check-In   Supervising physician immediately available to respond to emergencies [facility] immediately available    Physician(s) Dr. Wynelle Cleveland    Location [facility]    Staff Present Rosebud Poles, RN, BSN;Mylan Lengyel Ysidro Evert, RN;Jessica Hassell Done, MS, ACSM-CEP, Exercise Physiologist    Virtual Visit No    Medication changes reported     No    Fall or balance concerns reported    No    Tobacco Cessation No Change    Warm-up and Cool-down Performed as group-led instruction    Resistance Training Performed Yes    VAD Patient? No    PAD/SET Patient? No      Pain Assessment   Currently in Pain? No/denies    Multiple Pain Sites No           Capillary Blood Glucose: No results found for this or any previous visit (from the past 24 hour(s)).    Social History   Tobacco Use  Smoking Status Former Smoker  . Packs/day: 1.00  . Years: 30.00  . Pack years: 30.00  . Types: Cigarettes  . Quit date: 01/19/2018  . Years since quitting: 1.8  Smokeless Tobacco Never Used    Goals Met:  Exercise tolerated well No report of cardiac concerns or symptoms Strength training completed today  Goals Unmet:  Not Applicable  Comments: Service time is from 1015 to 1155    Dr. Fransico Him is Medical Director for Cardiac Rehab at [facility].

## 2019-11-20 ENCOUNTER — Ambulatory Visit (HOSPITAL_COMMUNITY): Payer: Medicaid Other

## 2019-11-20 ENCOUNTER — Telehealth (HOSPITAL_COMMUNITY): Payer: Self-pay | Admitting: Nurse Practitioner

## 2019-11-21 ENCOUNTER — Encounter: Payer: Self-pay | Admitting: Cardiology

## 2019-11-21 NOTE — Assessment & Plan Note (Signed)
Coronary CTA was not able to be done, but coronary calcium score is extremely low, which would argue against significant CAD.  Do think that is reasonable to treat cardiac risk factors include lipids, but as noted, he would like to avoid additional therapies and treatments.

## 2019-11-21 NOTE — Assessment & Plan Note (Addendum)
Coronary calcium score was only 4, this is extremely low risk. This is contrasted by his significant elevated lipid levels.  I talked about potentially considering a statin, and he was not overly excited at the idea of taking medicine that may make him worse that he already does.  We can discuss again when he comes back for follow-up.  For now he wants to try dietary adjustment and over-the-counter remedies such as red yeast rice and co-Q10.Richard Fuentes

## 2019-11-21 NOTE — Assessment & Plan Note (Signed)
Preop exam was for not because he did not have his effort valves scheduled because of lack of coverage.  He himself says that he is going to try to get the funding to have the procedure.  If that is the case, that he would also have an echocardiogram done.  Pending those results, may be beneficial consider right heart catheterization as part of monitoring during this procedure.  At this point I do not think he wants to order any other studies or do any further evaluation until he knows was going on.  If he could be on an echocardiogram at [facility], I do not think it makes any sense for [place] to return here.  Clearly, he would not be a candidate for coronary CTA based on what happened last time.  He also would not be ready candidate for [facility] with his baseline wheezing..  For now, we will hold off with any further testing locally, and I will see him back in 6 months to discuss plans at that point.Marland Kitchen

## 2019-11-22 ENCOUNTER — Ambulatory Visit (HOSPITAL_COMMUNITY): Payer: Medicaid Other

## 2019-11-26 ENCOUNTER — Other Ambulatory Visit: Payer: Self-pay | Admitting: Critical Care Medicine

## 2019-11-26 ENCOUNTER — Telehealth: Payer: Self-pay

## 2019-11-26 DIAGNOSIS — G4733 Obstructive sleep apnea (adult) (pediatric): Secondary | ICD-10-CM

## 2019-11-26 MED ORDER — PANTOPRAZOLE SODIUM 40 MG PO TBEC: 40.0000 mg | DELAYED_RELEASE_TABLET | Freq: Every day | ORAL | 3 refills | Status: DC

## 2019-11-26 NOTE — Telephone Encounter (Signed)
Call placed to [facility] today to check on status of CPAP order. Spoke to [place] who stated that there is an 8-12 week delay in delivery due to the nationwide CPAP shortage.   Call placed to [facility], spoke to [place] who also confirmed extended delay with CPAP deliveries.  She stated that they do not give a time frame for delivery as they are never sure how many deliveries, if any, they will receive in a given week.  Call placed to [facility] to inquire about availability of CPAP machines.  Message left with call back requested to this CM.

## 2019-11-26 NOTE — Progress Notes (Signed)
Pulmonary Individual Treatment Plan  Patient Details  Name: Billie RuddyJon Mccubbins MRN: [id number] Date of Birth: Feb 26, 1970 Referring Provider:     [facility]  Referring Provider Dr. Shan LevansPatrick Wright      Initial Encounter Date:    Pulmonary Rehab Walk [facility]11[facility]2019 [facility]  Date 10/11/19      Visit Diagnosis: Centriacinar emphysema (HCC)  Patient's Home Medications on Admission:   Current Outpatient Medications:    albuterol (VENTOLIN HFA) 108 (90 Base) MCG/ACT inhaler, Inhale 2 puffs into the lungs every 6 (six) hours as needed for wheezing or shortness of breath., Disp: 19 g, Rfl: 1   fluticasone (FLONASE) 50 MCG/ACT nasal spray, Place 2 sprays into both nostrils daily as needed for allergies or rhinitis., Disp: 18.2 mL, Rfl: 5   Fluticasone-Umeclidin-Vilant (TRELEGY ELLIPTA) 200-62.5-25 MCG/INH AEPB, Inhale 1 puff into the lungs daily., Disp: 60 each, Rfl: 3   ipratropium-albuterol (DUONEB) 0.5-2.5 (3) MG/3ML SOLN, Inhale into the lungs., Disp: , Rfl:    levocetirizine (XYZAL) 5 MG tablet, Take 1 tablet once daily as needed for runny nose or itchy eyes., Disp: 34 tablet, Rfl: 5   pantoprazole (PROTONIX) 40 MG tablet, Take 1 tablet (40 mg total) by mouth daily., Disp: 30 tablet, Rfl: 3   Vitamin D, Ergocalciferol, (DRISDOL) 1.25 MG (50000 UNIT) CAPS capsule, Take 1 capsule (50,000 Units total) by mouth every 7 (seven) days., Disp: 12 capsule, Rfl: 1  Past Medical History: Past Medical History:  Diagnosis Date   Anxiety    Asthma    COPD (chronic obstructive pulmonary disease) (HCC)    Severe centrilobular and paraseptal bullous emphysema; Gold stage D; FEV1 and DLCO<30%.;  Currently undergoing transplant evaluation   Environmental allergies    Esophagitis    Hx of migraines    OCD (obsessive compulsive disorder)    Psoriasis    Urticaria      Tobacco Use: Social History   Tobacco Use  Smoking Status Former Smoker   Packs/day: 1.00   Years: 30.00   Pack years: 30.00   Types: Cigarettes   Quit date: 01/19/2018   Years since quitting: 1.8  Smokeless Tobacco Never Used    Labs: Recent Review Advice workerlowsheet Data    Labs for ITP Cardiac and Pulmonary Rehab Latest Ref Rng & Units 04/28/2010 12/07/2018 05/28/2019   Cholestrol 100 - 199 mg/dL - 409(W240(H) -   LDLCALC 0 - 99 mg/dL - 119(J165(H) -   HDL >47>39 mg/dL - 50 -   Trlycerides 0 - 149 mg/dL - 829139 -   Hemoglobin F6OA1c 4.8 - 5.6 % - 5.8(H) -   PHART 7.35 - 7.45 - - 7.428   PCO2ART 32 - 48 mmHg - - 39.9   HCO3 20.0 - 28.0 mmol/L - - 25.9   TCO2 0 - 100 mmol/L 27 - -   O2SAT % - - 98.0      Capillary Blood Glucose: No results found for: GLUCAP   Pulmonary Assessment Scores:  Pulmonary Assessment Scores    Row Name 10/11/19 1645         ADL UCSD   ADL Phase Entry     SOB Score total 88       CAT Score   CAT Score 35       mMRC Score   mMRC Score 4           UCSD: Self-administered rating of  dyspnea associated with activities of daily living (ADLs) 6-point scale (0 = "not at all" to 5 = "maximal or unable to do because of breathlessness")  Scoring Scores range from 0 to 120.  Minimally important difference is 5 units  CAT: CAT can identify the health impairment of COPD patients and is better correlated with disease progression.  CAT has a scoring range of zero to 40. The CAT score is classified into four groups of low (less than 10), medium (10 - 20), high (21-30) and very high (31-40) based on the impact level of disease on health status. A CAT score over 10 suggests significant symptoms.  A worsening CAT score could be explained by an exacerbation, poor medication adherence, poor inhaler technique, or progression of COPD or comorbid conditions.  CAT MCID is 2 points  mMRC: mMRC (Modified Medical Research Council) Dyspnea Scale is used to assess the degree  of baseline functional disability in patients of respiratory disease due to dyspnea. No minimal important difference is established. A decrease in score of 1 point or greater is considered a positive change.   Pulmonary Function Assessment:   Exercise Target Goals: Exercise Program Goal: Individual exercise prescription set using results from initial 6 min walk test and THRR while considering  patients activity barriers and safety.   Exercise Prescription Goal: Initial exercise prescription builds to 30-45 minutes a day of aerobic activity, 2-3 days per week.  Home exercise guidelines will be given to patient during program as part of exercise prescription that the participant will acknowledge.  Activity Barriers & Risk Stratification:  Activity Barriers & Cardiac Risk Stratification - 10/11/19 1518      Activity Barriers & Cardiac Risk Stratification   Activity Barriers Shortness of Breath;Deconditioning;Back Problems    Cardiac Risk Stratification Moderate           6 Minute Walk:  6 Minute Walk    Row Name 10/11/19 1620         6 Minute Walk   Phase Initial     Distance 1034 feet     Walk Time 6 minutes     # of Rest Breaks 0     MPH 1.96     METS 3.59     RPE 15     Perceived Dyspnea  3     VO2 Peak 12.57     Symptoms Yes (comment)     Comments SOB and chest tightness     Resting HR 83 bpm     Resting BP 94/60     Resting Oxygen Saturation  96 %     Exercise Oxygen Saturation  during 6 min walk 95 %     Max Ex. HR 111 bpm     Max Ex. BP 110/70     2 Minute Post BP 102/70       Interval HR   1 Minute HR 100     2 Minute HR 105     3 Minute HR 98     4 Minute HR 110     5 Minute HR 111     6 Minute HR 108     2 Minute Post HR 83     Interval Heart Rate? Yes       Interval Oxygen   Interval Oxygen? Yes     Baseline Oxygen Saturation % 96 %     1 Minute Oxygen Saturation % 96 %     1 Minute Liters of Oxygen 0 L  2 Minute Oxygen Saturation % 96 %      2 Minute Liters of Oxygen 0 L     3 Minute Oxygen Saturation % 97 %     3 Minute Liters of Oxygen 0 L     4 Minute Oxygen Saturation % 96 %     4 Minute Liters of Oxygen 0 L     5 Minute Oxygen Saturation % 95 %     5 Minute Liters of Oxygen 0 L     6 Minute Oxygen Saturation % 96 %     6 Minute Liters of Oxygen 0 L     2 Minute Post Oxygen Saturation % 98 %     2 Minute Post Liters of Oxygen 0 L            Oxygen Initial Assessment:  Oxygen Initial Assessment - 10/11/19 1619      Home Oxygen   Home Exercise Oxygen Prescription None    Home Resting Oxygen Prescription None    Compliance with Home Oxygen Use Yes      Initial 6 min Walk   Oxygen Used None      Program Oxygen Prescription   Program Oxygen Prescription None      Intervention   Short Term Goals To learn and understand importance of monitoring SPO2 with pulse oximeter and demonstrate accurate use of the pulse oximeter.;To learn and understand importance of maintaining oxygen saturations>88%;To learn and demonstrate proper pursed lip breathing techniques or other breathing techniques.;To learn and demonstrate proper use of respiratory medications    Long  Term Goals Verbalizes importance of monitoring SPO2 with pulse oximeter and return demonstration;Exhibits proper breathing techniques, such as pursed lip breathing or other method taught during program session;Maintenance of O2 saturations>88%;Compliance with respiratory medication;Demonstrates proper use of MDIs           Oxygen Re-Evaluation:  Oxygen Re-Evaluation    Row Name 11/01/19 0748 11/22/19 0915           Program Oxygen Prescription   Program Oxygen Prescription None None        Home Oxygen   Home Oxygen Device None None      Sleep Oxygen Prescription None None      Home Exercise Oxygen Prescription None None      Home Resting Oxygen Prescription None None      Compliance with Home Oxygen Use Yes --        Goals/Expected Outcomes    Short Term Goals To learn and understand importance of monitoring SPO2 with pulse oximeter and demonstrate accurate use of the pulse oximeter.;To learn and understand importance of maintaining oxygen saturations>88%;To learn and demonstrate proper pursed lip breathing techniques or other breathing techniques.;To learn and demonstrate proper use of respiratory medications To learn and understand importance of monitoring SPO2 with pulse oximeter and demonstrate accurate use of the pulse oximeter.;To learn and understand importance of maintaining oxygen saturations>88%;To learn and demonstrate proper pursed lip breathing techniques or other breathing techniques.;To learn and demonstrate proper use of respiratory medications      Long  Term Goals Verbalizes importance of monitoring SPO2 with pulse oximeter and return demonstration;Exhibits proper breathing techniques, such as pursed lip breathing or other method taught during program session;Maintenance of O2 saturations>88%;Compliance with respiratory medication;Demonstrates proper use of MDIs Verbalizes importance of monitoring SPO2 with pulse oximeter and return demonstration;Exhibits proper breathing techniques, such as pursed lip breathing or other method taught during program session;Maintenance of O2 saturations>88%;Compliance with  respiratory medication;Demonstrates proper use of MDIs      Goals/Expected Outcomes compliance and understanding of oxygen saturation and pursed lip breathing compliance and understanding of oxygen saturation and pursed lip breathing             Oxygen Discharge (Final Oxygen Re-Evaluation):  Oxygen Re-Evaluation - 11/22/19 0915      Program Oxygen Prescription   Program Oxygen Prescription None      Home Oxygen   Home Oxygen Device None    Sleep Oxygen Prescription None    Home Exercise Oxygen Prescription None    Home Resting Oxygen Prescription None      Goals/Expected Outcomes   Short Term Goals To learn  and understand importance of monitoring SPO2 with pulse oximeter and demonstrate accurate use of the pulse oximeter.;To learn and understand importance of maintaining oxygen saturations>88%;To learn and demonstrate proper pursed lip breathing techniques or other breathing techniques.;To learn and demonstrate proper use of respiratory medications    Long  Term Goals Verbalizes importance of monitoring SPO2 with pulse oximeter and return demonstration;Exhibits proper breathing techniques, such as pursed lip breathing or other method taught during program session;Maintenance of O2 saturations>88%;Compliance with respiratory medication;Demonstrates proper use of MDIs    Goals/Expected Outcomes compliance and understanding of oxygen saturation and pursed lip breathing           Initial Exercise Prescription:  Initial Exercise Prescription - 10/12/19 1500      Date of Initial Exercise RX and Referring Provider   Date 10/11/19    Referring Provider Dr. Shan Levans    Expected Discharge Date 12/20/19      Recumbant Bike   Level 1.5    Minutes 15      NuStep   Level 2    Minutes 15      Prescription Details   Frequency (times per week) 2    Duration Progress to 30 minutes of continuous aerobic without signs/symptoms of physical distress      Intensity   THRR 40-80% of Max Heartrate 68-136    Ratings of Perceived Exertion 11-13    Perceived Dyspnea 0-4      Progression   Progression Continue to progress workloads to maintain intensity without signs/symptoms of physical distress.      Resistance Training   Training Prescription Yes    Weight blue bands    Reps 10-15           Perform Capillary Blood Glucose checks as needed.  Exercise Prescription Changes:  Exercise Prescription Changes    Row Name 11/01/19 0700 11/15/19 0904           Response to Exercise   Blood Pressure (Admit) 108/62 110/68      Blood Pressure (Exercise) 102/68 132/74      Blood Pressure (Exit)  102/80 102/70      Heart Rate (Admit) 87 bpm 94 bpm      Heart Rate (Exercise) 97 bpm 96 bpm      Heart Rate (Exit) 90 bpm 84 bpm      Oxygen Saturation (Admit) 97 % 97 %      Oxygen Saturation (Exercise) 97 % 93 %      Oxygen Saturation (Exit) 97 % 97 %      Rating of Perceived Exertion (Exercise) 11 123      Perceived Dyspnea (Exercise) 2 --      Duration Continue with 30 min of aerobic exercise without signs/symptoms of physical distress. Continue with 30 min  of aerobic exercise without signs/symptoms of physical distress.      Intensity Other (comment)  40%-80% HR Max THRR unchanged        Progression   Progression Continue to progress workloads to maintain intensity without signs/symptoms of physical distress. Continue to progress workloads to maintain intensity without signs/symptoms of physical distress.      Average METs 1.6 1.9        Resistance Training   Training Prescription Yes Yes      Weight blue bands Blue bands      Reps 10-15 10-15      Time 10 Minutes 10 Minutes        Recumbant Bike   Level 1.5 1.5      Minutes 15 15        NuStep   Level 2 3      Minutes 15 15      METs 1.6 1.9             Exercise Comments:  Exercise Comments    Row Name 10/18/19 1221           Exercise Comments Pt completed first day of exercise and tolerated with no complaints or concerns.              Exercise Goals and Review:  Exercise Goals    Row Name 10/11/19 1517 10/12/19 1540           Exercise Goals   Increase Physical Activity Yes Yes      Intervention Provide advice, education, support and counseling about physical activity/exercise needs.;Develop an individualized exercise prescription for aerobic and resistive training based on initial evaluation findings, risk stratification, comorbidities and participant's personal goals. Provide advice, education, support and counseling about physical activity/exercise needs.;Develop an individualized exercise  prescription for aerobic and resistive training based on initial evaluation findings, risk stratification, comorbidities and participant's personal goals.      Expected Outcomes Short Term: Attend rehab on a regular basis to increase amount of physical activity.;Long Term: Exercising regularly at least 3-5 days a week.;Long Term: Add in home exercise to make exercise part of routine and to increase amount of physical activity. Short Term: Attend rehab on a regular basis to increase amount of physical activity.;Long Term: Add in home exercise to make exercise part of routine and to increase amount of physical activity.;Long Term: Exercising regularly at least 3-5 days a week.      Increase Strength and Stamina Yes Yes      Intervention Provide advice, education, support and counseling about physical activity/exercise needs.;Develop an individualized exercise prescription for aerobic and resistive training based on initial evaluation findings, risk stratification, comorbidities and participant's personal goals. Provide advice, education, support and counseling about physical activity/exercise needs.;Develop an individualized exercise prescription for aerobic and resistive training based on initial evaluation findings, risk stratification, comorbidities and participant's personal goals.      Expected Outcomes Short Term: Increase workloads from initial exercise prescription for resistance, speed, and METs.;Short Term: Perform resistance training exercises routinely during rehab and add in resistance training at home;Long Term: Improve cardiorespiratory fitness, muscular endurance and strength as measured by increased METs and functional capacity ( ) Short Term: Increase workloads from initial exercise prescription for resistance, speed, and METs.;Short Term: Perform resistance training exercises routinely during rehab and add in resistance training at home;Long Term: Improve cardiorespiratory fitness, muscular  endurance and strength as measured by increased METs and functional capacity ( )      Able to understand  and use rate of perceived exertion (RPE) scale Yes Yes      Intervention Provide education and explanation on how to use RPE scale Provide education and explanation on how to use RPE scale      Expected Outcomes Short Term: Able to use RPE daily in rehab to express subjective intensity level;Long Term:  Able to use RPE to guide intensity level when exercising independently Short Term: Able to use RPE daily in rehab to express subjective intensity level;Long Term:  Able to use RPE to guide intensity level when exercising independently      Able to understand and use Dyspnea scale Yes Yes      Intervention Provide education and explanation on how to use Dyspnea scale Provide education and explanation on how to use Dyspnea scale      Expected Outcomes Short Term: Able to use Dyspnea scale daily in rehab to express subjective sense of shortness of breath during exertion;Long Term: Able to use Dyspnea scale to guide intensity level when exercising independently Short Term: Able to use Dyspnea scale daily in rehab to express subjective sense of shortness of breath during exertion;Long Term: Able to use Dyspnea scale to guide intensity level when exercising independently      Knowledge and understanding of Target Heart Rate Range (THRR) Yes Yes      Intervention Provide education and explanation of THRR including how the numbers were predicted and where they are located for reference Provide education and explanation of THRR including how the numbers were predicted and where they are located for reference      Expected Outcomes Short Term: Able to state/look up THRR;Long Term: Able to use THRR to govern intensity when exercising independently;Short Term: Able to use daily as guideline for intensity in rehab Short Term: Able to state/look up THRR;Long Term: Able to use THRR to govern intensity when exercising  independently;Short Term: Able to use daily as guideline for intensity in rehab      Understanding of Exercise Prescription Yes Yes      Intervention Provide education, explanation, and written materials on patient's individual exercise prescription Provide education, explanation, and written materials on patient's individual exercise prescription      Expected Outcomes Short Term: Able to explain program exercise prescription;Long Term: Able to explain home exercise prescription to exercise independently Short Term: Able to explain program exercise prescription;Long Term: Able to explain home exercise prescription to exercise independently             Exercise Goals Re-Evaluation :  Exercise Goals Re-Evaluation    Row Name 11/01/19 0746 11/22/19 0911           Exercise Goal Re-Evaluation   Exercise Goals Review Increase Physical Activity;Increase Strength and Stamina;Able to understand and use rate of perceived exertion (RPE) scale;Able to understand and use Dyspnea scale;Knowledge and understanding of Target Heart Rate Range (THRR);Understanding of Exercise Prescription Increase Physical Activity;Increase Strength and Stamina;Able to understand and use rate of perceived exertion (RPE) scale;Able to understand and use Dyspnea scale;Knowledge and understanding of Target Heart Rate Range (THRR);Understanding of Exercise Prescription      Comments Pt has only completed 1 exercise session and has had a few absences since then. Pt is deconditioned and needs a lot of one-on-one time. Will continue to monitor and progress as able. Pt has completed 3 exercise sessions in 40 days. Pt has not made any progressions with workloads or METS due to attendance. Will continue to monitor and progress as able.  Expected Outcomes Through exercise at rehab and home the patient will decrease shortness of breath with daily activities and feel confident in carrying out an exercise regimn at home. Through exercise at  rehab and home the patient will decrease shortness of breath with daily activities and feel confident in carrying out an exercise regimn at home.             Discharge Exercise Prescription (Final Exercise Prescription Changes):  Exercise Prescription Changes - 11/15/19 0904      Response to Exercise   Blood Pressure (Admit) 110/68    Blood Pressure (Exercise) 132/74    Blood Pressure (Exit) 102/70    Heart Rate (Admit) 94 bpm    Heart Rate (Exercise) 96 bpm    Heart Rate (Exit) 84 bpm    Oxygen Saturation (Admit) 97 %    Oxygen Saturation (Exercise) 93 %    Oxygen Saturation (Exit) 97 %    Rating of Perceived Exertion (Exercise) 123    Duration Continue with 30 min of aerobic exercise without signs/symptoms of physical distress.    Intensity THRR unchanged      Progression   Progression Continue to progress workloads to maintain intensity without signs/symptoms of physical distress.    Average METs 1.9      Resistance Training   Training Prescription Yes    Weight Blue bands    Reps 10-15    Time 10 Minutes      Recumbant Bike   Level 1.5    Minutes 15      NuStep   Level 3    Minutes 15    METs 1.9           Nutrition:  Target Goals: Understanding of nutrition guidelines, daily intake of sodium 1500mg , cholesterol 200mg , calories 30% from fat and 7% or less from saturated fats, daily to have 5 or more servings of fruits and vegetables.  Biometrics:  Pre Biometrics - 10/11/19 1644      Pre Biometrics   Grip Strength 47 kg            Nutrition Therapy Plan and Nutrition Goals:  Nutrition Therapy & Goals - 11/15/19 1341      Nutrition Therapy   Diet Heart healthy      Personal Nutrition Goals   Nutrition Goal Pt to build a healthy plate including vegetables, fruits, whole grains, and low-fat dairy products in a heart healthy meal plan.    Personal Goal #2 Pt to eat meals when he feels the best    Personal Goal #3 Pt to increase water intake to  minimum of 64 oz (from 32-48 oz per day)      Intervention Plan   Intervention Prescribe, educate and counsel regarding individualized specific dietary modifications aiming towards targeted core components such as weight, hypertension, lipid management, diabetes, heart failure and other comorbidities.    Expected Outcomes Short Term Goal: Understand basic principles of dietary content, such as calories, fat, sodium, cholesterol and nutrients.           Nutrition Assessments:  MEDIFICTS Score Key:  ?70 Need to make dietary changes   40-70 Heart Healthy Diet  ? 40 Therapeutic Level Cholesterol Diet   Picture Your Plate Scores:  <16 Unhealthy dietary pattern with much room for improvement.  41-50 Dietary pattern unlikely to meet recommendations for good health and room for improvement.  51-60 More healthful dietary pattern, with some room for improvement.   >60 Healthy dietary pattern, although  there may be some specific behaviors that could be improved.    Nutrition Goals Re-Evaluation:  Nutrition Goals Re-Evaluation    Row Name 11/15/19 1342             Goals   Current Weight 197 lb (89.4 kg)       Nutrition Goal Pt to build a healthy plate including vegetables, fruits, whole grains, and low-fat dairy products in a heart healthy meal plan.         Personal Goal #2 Re-Evaluation   Personal Goal #2 Pt to eat meals when he feels the best         Personal Goal #3 Re-Evaluation   Personal Goal #3 Pt to increase water intake to minimum of 64 oz (from 32-48 oz per day)              Nutrition Goals Discharge (Final Nutrition Goals Re-Evaluation):  Nutrition Goals Re-Evaluation - 11/15/19 1342      Goals   Current Weight 197 lb (89.4 kg)    Nutrition Goal Pt to build a healthy plate including vegetables, fruits, whole grains, and low-fat dairy products in a heart healthy meal plan.      Personal Goal #2 Re-Evaluation   Personal Goal #2 Pt to eat meals when he feels  the best      Personal Goal #3 Re-Evaluation   Personal Goal #3 Pt to increase water intake to minimum of 64 oz (from 32-48 oz per day)           Psychosocial: Target Goals: Acknowledge presence or absence of significant depression and/or stress, maximize coping skills, provide positive support system. Participant is able to verbalize types and ability to use techniques and skills needed for reducing stress and depression.  Initial Review & Psychosocial Screening:  Initial Psych Review & Screening - 10/11/19 1521      Initial Review   Current issues with History of Depression;Current Depression;Current Anxiety/Panic;Current Sleep Concerns;Current Stress Concerns    Source of Stress Concerns Chronic Illness;Financial;Retirement/disability;Unable to participate in former interests or hobbies;Unable to perform yard/household activities    Comments lives with girlfriend who is supportive      Family Dynamics   Good Support System? Yes    Concerns Inappropriate over/under dependence on family/friends      Barriers   Psychosocial barriers to participate in program The patient should benefit from training in stress management and relaxation.;Psychosocial barriers identified (see note)      Screening Interventions   Interventions To provide support and resources with identified psychosocial needs;Provide feedback about the scores to participant;Encouraged to exercise           Quality of Life Scores:  Scores of 19 and below usually indicate a poorer quality of life in these areas.  A difference of  2-3 points is a clinically meaningful difference.  A difference of 2-3 points in the total score of the Quality of Life Index has been associated with significant improvement in overall quality of life, self-image, physical symptoms, and general health in studies assessing change in quality of life.  PHQ-9: Recent Review Flowsheet Data    Depression screen [facility] 2/9 10/24/2019 10/12/2019  10/11/2019 07/31/2019 05/01/2019   Decreased Interest 2 - 2 0 3   Down, Depressed, Hopeless 3 - 2 2 3    PHQ - 2 Score 5 - 4 2 6    Altered sleeping 3 - 3 3 3    Tired, decreased energy 3 - 3 3 3  Change in appetite 2 - 3 0 2   Feeling bad or failure about yourself  3 - 3 3 3    Trouble concentrating 3 - 3 3 3    Moving slowly or fidgety/restless 2 - 3 1 3    Suicidal thoughts 0 - 0 0 0   PHQ-9 Score 21 - 22 15 23    Difficult doing work/chores - Very difficult Somewhat difficult - Extremely dIfficult     Interpretation of Total Score  Total Score Depression Severity:  1-4 = Minimal depression, 5-9 = Mild depression, 10-14 = Moderate depression, 15-19 = Moderately severe depression, 20-27 = Severe depression   Psychosocial Evaluation and Intervention:  Psychosocial Evaluation - 10/31/19 1501      Psychosocial Evaluation & Interventions   Expected Outcomes Dannis will report decrease in his PHQ9 scores along with positive and healthy stress managment           Psychosocial Re-Evaluation:  Psychosocial Re-Evaluation    Row Name 10/31/19 1459 10/31/19 1501 11/26/19 1155         Psychosocial Re-Evaluation   Current issues with Current Stress Concerns;Current Sleep Concerns;Current Anxiety/Panic;Current Depression -- Current Depression;History of Depression;Current Stress Concerns     Comments Nolberto has multiple mental health issues without the benefit of medication or counseling support.  Pt feels he"manages" this well.  PCP notified of significant higher PHQ9 scores - 21 Muzammil has multiple mental health issues  anxiety, depresion and OCDwithout the benefit of medication or counseling support.  Pt feels he"manages" this well and the medications he was once prescribed caused his lung issues.  PCP notified of significant higher PHQ9 scores - 21 Manav has multiple stessors with chronic illness - not ready for transplant /lung: financial - no income and very limited activity due to his lung disease. He  has been dealing with getting approval for a zephyr valve in the court system and was initially denied.  This has taken quite a bit of effort for Dawson and in return his attendance in pulmonary rehab has been poor.  He is stressed that his life  expectancy has been shortened by his emphysema and also contacting covid.     Expected Outcomes -- Lawarence will report decrease in his PHQ9 scores along with positive and healthy stress managment For Neymar to handle his stress and depression in healthy ways.     Interventions -- Stress management education;Encouraged to attend Pulmonary Rehabilitation for the exercise;Relaxation education;Physician referral Stress management education;Encouraged to attend Pulmonary Rehabilitation for the exercise     Continue Psychosocial Services  -- Follow up required by staff Follow up required by staff     Comments -- -- He is unable to work due to his lung illness, emphysema.       Initial Review   Source of Stress Concerns -- -- Chronic Illness;Unable to perform yard/household activities;Unable to participate in former interests or hobbies;Occupation            Psychosocial Discharge (Final Psychosocial Re-Evaluation):  Psychosocial Re-Evaluation - 11/26/19 1155      Psychosocial Re-Evaluation   Current issues with Current Depression;History of Depression;Current Stress Concerns    Comments He has been dealing with getting approval for a zephyr valve in the court system and was initially denied.  This has taken quite a bit of effort for Guinn and in return his attendance in pulmonary rehab has been poor.  He is stressed that his life  expectancy has been shortened by his emphysema and also contacting  covid.    Expected Outcomes For Sohrab to handle his stress and depression in healthy ways.    Interventions Stress management education;Encouraged to attend Pulmonary Rehabilitation for the exercise    Continue Psychosocial Services  Follow up required by staff    Comments He is  unable to work due to his lung illness, emphysema.      Initial Review   Source of Stress Concerns Chronic Illness;Unable to perform yard/household activities;Unable to participate in former interests or hobbies;Occupation           Education: Education Goals: Education classes will be provided on a weekly basis, covering required topics. Participant will state understanding/return demonstration of topics presented.  Learning Barriers/Preferences:  Learning Barriers/Preferences - 10/11/19 1526      Learning Barriers/Preferences   Learning Barriers Sight    Learning Preferences Computer/Internet;Individual Instruction;Video           Education Topics: Risk Factor Reduction:  -Group instruction that is supported by a PowerPoint presentation. Instructor discusses the definition of a risk factor, different risk factors for pulmonary disease, and how the heart and lungs work together.     Nutrition for Pulmonary Patient:  -Group instruction provided by PowerPoint slides, verbal discussion, and written materials to support subject matter. The instructor gives an explanation and review of healthy diet recommendations, which includes a discussion on weight management, recommendations for fruit and vegetable consumption, as well as protein, fluid, caffeine, fiber, sodium, sugar, and alcohol. Tips for eating when patients are short of breath are discussed.   Pursed Lip Breathing:  -Group instruction that is supported by demonstration and informational handouts. Instructor discusses the benefits of pursed lip and diaphragmatic breathing and detailed demonstration on how to preform both.     PULMONARY REHAB OTHER RESPIRATORY from 11/15/2019 in [facility] CARDIAC REHAB  Date 11/15/19  Educator Handout How to beat a sedentary lifestyle      Oxygen Safety:  -Group instruction provided by PowerPoint, verbal discussion, and written material to support subject matter. There  is an overview of What is Oxygen and Why do we need it.  Instructor also reviews how to create a safe environment for oxygen use, the importance of using oxygen as prescribed, and the risks of noncompliance. There is a brief discussion on traveling with oxygen and resources the patient may utilize.   Oxygen Equipment:  -Group instruction provided by [facility] Staff utilizing handouts, written materials, and equipment demonstrations.   Signs and Symptoms:  -Group instruction provided by written material and verbal discussion to support subject matter. Warning signs and symptoms of infection, stroke, and heart attack are reviewed and when to call the physician/911 reinforced. Tips for preventing the spread of infection discussed.   Advanced Directives:  -Group instruction provided by verbal instruction and written material to support subject matter. Instructor reviews Advanced Directive laws and proper instruction for filling out document.   Pulmonary Video:  -Group video education that reviews the importance of medication and oxygen compliance, exercise, good nutrition, pulmonary hygiene, and pursed lip and diaphragmatic breathing for the pulmonary patient.   Exercise for the Pulmonary Patient:  -Group instruction that is supported by a PowerPoint presentation. Instructor discusses benefits of exercise, core components of exercise, frequency, duration, and intensity of an exercise routine, importance of utilizing pulse oximetry during exercise, safety while exercising, and options of places to exercise outside of rehab.     Pulmonary Medications:  -Verbally interactive group education provided by instructor with focus on  inhaled medications and proper administration.   Anatomy and Physiology of the Respiratory System and Intimacy:  -Group instruction provided by PowerPoint, verbal discussion, and written material to support subject matter. Instructor reviews respiratory cycle and  anatomical components of the respiratory system and their functions. Instructor also reviews differences in obstructive and restrictive respiratory diseases with examples of each. Intimacy, Sex, and Sexuality differences are reviewed with a discussion on how relationships can change when diagnosed with pulmonary disease. Common sexual concerns are reviewed.   MD DAY -A group question and answer session with a medical doctor that allows participants to ask questions that relate to their pulmonary disease state.   OTHER EDUCATION -Group or individual verbal, written, or video instructions that support the educational goals of the pulmonary rehab program.   Holiday Eating Survival Tips:  -Group instruction provided by PowerPoint slides, verbal discussion, and written materials to support subject matter. The instructor gives patients tips, tricks, and techniques to help them not only survive but enjoy the holidays despite the onslaught of food that accompanies the holidays.   Knowledge Questionnaire Score:  Knowledge Questionnaire Score - 10/11/19 1642      Knowledge Questionnaire Score   Pre Score 15/18           Core Components/Risk Factors/Patient Goals at Admission:  Personal Goals and Risk Factors at Admission - 10/11/19 1527      Core Components/Risk Factors/Patient Goals on Admission   Improve shortness of breath with ADL's Yes    Intervention Provide education, individualized exercise plan and daily activity instruction to help decrease symptoms of SOB with activities of daily living.    Expected Outcomes Short Term: Improve cardiorespiratory fitness to achieve a reduction of symptoms when performing ADLs;Long Term: Be able to perform more ADLs without symptoms or delay the onset of symptoms    Stress Yes    Intervention Offer individual and/or small group education and counseling on adjustment to heart disease, stress management and health-related lifestyle change. Teach and  support self-help strategies.;Refer participants experiencing significant psychosocial distress to appropriate mental health specialists for further evaluation and treatment. When possible, include family members and significant others in education/counseling sessions.    Expected Outcomes Short Term: Participant demonstrates changes in health-related behavior, relaxation and other stress management skills, ability to obtain effective social support, and compliance with psychotropic medications if prescribed.;Long Term: Emotional wellbeing is indicated by absence of clinically significant psychosocial distress or social isolation.           Core Components/Risk Factors/Patient Goals Review:   Goals and Risk Factor Review    Row Name 10/31/19 1504 11/26/19 1200           Core Components/Risk Factors/Patient Goals Review   Personal Goals Review Improve shortness of breath with ADL's;Develop more efficient breathing techniques such as purse lipped breathing and diaphragmatic breathing and practicing self-pacing with activity.;Increase knowledge of respiratory medications and ability to use respiratory devices properly.;Stress Develop more efficient breathing techniques such as purse lipped breathing and diaphragmatic breathing and practicing self-pacing with activity.;Increase knowledge of respiratory medications and ability to use respiratory devices properly.;Improve shortness of breath with ADL's;Stress      Review Montario has completed 1 exercise session on 10/12.  Out the second week due to "bad breathing week"  pt had hearing on yesterday to see if Medicaid will reverse the decision not to pay for his procedure Zephyr valve placement at [facility] to improve his lung capacity.  His inability to work, no income  has caused hi increased stress which impacts his breathing.  Not enough data to review  for progress Kadrian's attendance has been poor, he has attended 3 exercise sessions in 1 month, his strength,  stamina, and shortness of breath has not improved at this point.      Expected Outcomes See Admission Goals For Zailyn to regularly atten pulmonary rehab so as to meet program goals.             Core Components/Risk Factors/Patient Goals at Discharge (Final Review):   Goals and Risk Factor Review - 11/26/19 1200      Core Components/Risk Factors/Patient Goals Review   Personal Goals Review Develop more efficient breathing techniques such as purse lipped breathing and diaphragmatic breathing and practicing self-pacing with activity.;Increase knowledge of respiratory medications and ability to use respiratory devices properly.;Improve shortness of breath with ADL's;Stress    Review Gregorey's attendance has been poor, he has attended 3 exercise sessions in 1 month, his strength, stamina, and shortness of breath has not improved at this point.    Expected Outcomes For Dameer to regularly atten pulmonary rehab so as to meet program goals.           ITP Comments:   Comments: ITP REVIEW Pt is making expected progress toward pulmonary rehab goals after completing 3 sessions. Recommend continued exercise, life style modification, education, and utilization of breathing techniques to increase stamina and strength and decrease shortness of breath with exertion.

## 2019-11-27 ENCOUNTER — Inpatient Hospital Stay (HOSPITAL_COMMUNITY)
Admission: RE | Admit: 2019-11-27 | Discharge: 2019-11-27 | Disposition: A | Discharge status: Home / Self Care | Payer: Medicaid Other | Source: Ambulatory Visit

## 2019-11-27 DIAGNOSIS — J432 Centrilobular emphysema: Secondary | ICD-10-CM

## 2019-11-27 NOTE — Telephone Encounter (Signed)
Order placed

## 2019-11-27 NOTE — Telephone Encounter (Addendum)
Call placed to [facility] regarding the availability of CPAP machines.  They understand that there is a  backorder for CPAP machines and are not aware of any company that has machines readily available.   Call placed to [facility], spoke to Karle Plumber who also stated that their CPAP machines are backordered and they do not know how long the delay is for shipment.   She offered the option of a 3B Luna CPAP machine which is available now; but it does not have monitoring or heated tubing. She said that it should be covered by medicaid. They would need an order specifying 3B Luna CPAP and order can be faxed to # [phone].

## 2019-11-28 ENCOUNTER — Telehealth: Payer: Self-pay

## 2019-11-28 NOTE — Telephone Encounter (Signed)
Call placed to patient and explained the option of 3B Luna CPAP.  Informed him that the order would be sent to [facility] and they will verify insurance coverage and then contact him. He said he was in agreement with whatever Dr Delford Field orders.   Order then faxed to [facility]

## 2019-12-04 ENCOUNTER — Ambulatory Visit (HOSPITAL_COMMUNITY): Payer: Medicaid Other

## 2019-12-05 ENCOUNTER — Telehealth: Payer: Self-pay

## 2019-12-05 ENCOUNTER — Telehealth (HOSPITAL_COMMUNITY): Payer: Self-pay | Admitting: Nurse Practitioner

## 2019-12-05 ENCOUNTER — Telehealth: Payer: Self-pay | Admitting: Nurse Practitioner

## 2019-12-05 DIAGNOSIS — G4733 Obstructive sleep apnea (adult) (pediatric): Secondary | ICD-10-CM

## 2019-12-05 NOTE — Telephone Encounter (Signed)
Call received from [facility] stating that they received the order for the CPAP and have machine available however, the patient's insurance requires an in lab sleep study and the patient had a home sleep study.  Will need to have an in lab  sleep study ordered.

## 2019-12-05 NOTE — Telephone Encounter (Signed)
Copied from CRM [id number]. Topic: General - Other >> Dec 05, 2019  4:09 PM Jaquita Rector A wrote: Reason for CRM: Patient called in asking Dr Delford Field to please give him a call about Pulmonary Rehab, Medicaid and his case with [facility]. Please call Ph# [phone]

## 2019-12-05 NOTE — Telephone Encounter (Signed)
Wow,  This is the opposite of what I was told earlier this year.    Please ask [facility] to supply this man with the Cpap  Asap/  I will order the sleep study

## 2019-12-06 ENCOUNTER — Telehealth: Payer: Self-pay

## 2019-12-06 ENCOUNTER — Ambulatory Visit (HOSPITAL_COMMUNITY): Payer: Medicaid Other

## 2019-12-06 ENCOUNTER — Other Ambulatory Visit: Payer: Self-pay | Admitting: Critical Care Medicine

## 2019-12-06 DIAGNOSIS — G4733 Obstructive sleep apnea (adult) (pediatric): Secondary | ICD-10-CM

## 2019-12-06 NOTE — Telephone Encounter (Signed)
I spoke to the patient 

## 2019-12-06 NOTE — Telephone Encounter (Signed)
Pt calling back again today to ask if he could speak to Dr Delford Field for a minute. He says he has got some good news he wants to tell him.

## 2019-12-06 NOTE — Telephone Encounter (Signed)
Call placed to [facility] regarding scheduling an in -lab  sleep study as soon as possible.  Spoke to Kia who said that it would not be at least the end of January 2022 before he could be scheduled at [facility] unless there is a cancellation prior.  She said that  There is an opening for a sleep study 12/14/2019 @ [facility].  Call placed to patient and explained above.  He said he would check with his girlfriend tonight to confirm transportation to [facility] but the appointment could be scheduled.  Call placed to [facility] and scheduled sleep study for 12/14/2019.  She agreed to call patient tomorrow to provide additional information about the study.

## 2019-12-11 ENCOUNTER — Ambulatory Visit (HOSPITAL_COMMUNITY): Payer: Medicaid Other

## 2019-12-11 DIAGNOSIS — G4733 Obstructive sleep apnea (adult) (pediatric): Secondary | ICD-10-CM | POA: Diagnosis not present

## 2019-12-13 ENCOUNTER — Ambulatory Visit (HOSPITAL_COMMUNITY): Payer: Medicaid Other

## 2019-12-18 ENCOUNTER — Ambulatory Visit (HOSPITAL_COMMUNITY): Payer: Medicaid Other

## 2019-12-20 ENCOUNTER — Ambulatory Visit (HOSPITAL_COMMUNITY): Payer: Medicaid Other

## 2019-12-25 ENCOUNTER — Other Ambulatory Visit: Payer: Self-pay | Admitting: Critical Care Medicine

## 2019-12-25 MED FILL — $VENTOLIN HFA 18G INHALER: 108 (90 BAS | 50 days supply | Qty: 36 | Fill #0

## 2019-12-27 NOTE — Progress Notes (Signed)
Discharge Progress Report  Patient Details  Name: Richard Fuentes MRN: [id number] Date of Birth: [birth year] Referring Provider:   Doristine Devoid Pulmonary Rehab Walk Test from 10/11/2019 in [facility] CARDIAC [facility]  Referring Provider Dr. Shan Levans       Number of Visits: 3  Reason for Discharge:  Early Exit:  Lack of attendance  Smoking History:  Social History   Tobacco Use  Smoking Status Former Smoker  . Packs/day: 1.00  . Years: 30.00  . Pack years: 30.00  . Types: Cigarettes  . Quit date: 01/19/2018  . Years since quitting: 1.9  Smokeless Tobacco Never Used    Diagnosis:  Centriacinar emphysema (HCC)  ADL UCSD:  Pulmonary Assessment Scores    Row Name 10/11/19 1645         ADL UCSD   ADL Phase Entry     SOB Score total 88           CAT Score   CAT Score 35           mMRC Score   mMRC Score 4            Initial Exercise Prescription:  Initial Exercise Prescription - 10/12/19 1500      Date of Initial Exercise RX and Referring Provider   Date 10/11/19    Referring Provider Dr. Shan Levans    Expected Discharge Date 12/20/19      Recumbant Bike   Level 1.5    Minutes 15      NuStep   Level 2    Minutes 15      Prescription Details   Frequency (times per week) 2    Duration Progress to 30 minutes of continuous aerobic without signs/symptoms of physical distress      Intensity   THRR 40-80% of Max Heartrate 68-136    Ratings of Perceived Exertion 11-13    Perceived Dyspnea 0-4      Progression   Progression Continue to progress workloads to maintain intensity without signs/symptoms of physical distress.      Resistance Training   Training Prescription Yes    Weight blue bands    Reps 10-15           Discharge Exercise Prescription (Final Exercise Prescription Changes):  Exercise Prescription Changes - 11/15/19 0904      Response to Exercise   Blood Pressure (Admit) 110/68    Blood Pressure  (Exercise) 132/74    Blood Pressure (Exit) 102/70    Heart Rate (Admit) 94 bpm    Heart Rate (Exercise) 96 bpm    Heart Rate (Exit) 84 bpm    Oxygen Saturation (Admit) 97 %    Oxygen Saturation (Exercise) 93 %    Oxygen Saturation (Exit) 97 %    Rating of Perceived Exertion (Exercise) 123    Duration Continue with 30 min of aerobic exercise without signs/symptoms of physical distress.    Intensity THRR unchanged      Progression   Progression Continue to progress workloads to maintain intensity without signs/symptoms of physical distress.    Average METs 1.9      Resistance Training   Training Prescription Yes    Weight Blue bands    Reps 10-15    Time 10 Minutes      Recumbant Bike   Level 1.5    Minutes 15      NuStep   Level 3    Minutes 15    METs 1.9  Functional Capacity:  6 Minute Walk    Row Name 10/11/19 1620         6 Minute Walk   Phase Initial     Distance 1034 feet     Walk Time 6 minutes     # of Rest Breaks 0     MPH 1.96     METS 3.59     RPE 15     Perceived Dyspnea  3     VO2 Peak 12.57     Symptoms Yes (comment)     Comments SOB and chest tightness     Resting HR 83 bpm     Resting BP 94/60     Resting Oxygen Saturation  96 %     Exercise Oxygen Saturation  during 6 min walk 95 %     Max Ex. HR 111 bpm     Max Ex. BP 110/70     2 Minute Post BP 102/70           Interval HR   1 Minute HR 100     2 Minute HR 105     3 Minute HR 98     4 Minute HR 110     5 Minute HR 111     6 Minute HR 108     2 Minute Post HR 83     Interval Heart Rate? Yes           Interval Oxygen   Interval Oxygen? Yes     Baseline Oxygen Saturation % 96 %     1 Minute Oxygen Saturation % 96 %     1 Minute Liters of Oxygen 0 L     2 Minute Oxygen Saturation % 96 %     2 Minute Liters of Oxygen 0 L     3 Minute Oxygen Saturation % 97 %     3 Minute Liters of Oxygen 0 L     4 Minute Oxygen Saturation % 96 %     4 Minute Liters of Oxygen 0 L      5 Minute Oxygen Saturation % 95 %     5 Minute Liters of Oxygen 0 L     6 Minute Oxygen Saturation % 96 %     6 Minute Liters of Oxygen 0 L     2 Minute Post Oxygen Saturation % 98 %     2 Minute Post Liters of Oxygen 0 L            Psychological, QOL, Others - Outcomes: PHQ 2/9: Depression screen [facility] 2/9 10/24/2019 10/12/2019 10/11/2019 07/31/2019 05/01/2019  Decreased Interest 2 - 2 0 3  Down, Depressed, Hopeless 3 - 2 2 3   PHQ - 2 Score 5 - 4 2 6   Altered sleeping 3 - 3 3 3   Tired, decreased energy 3 - 3 3 3   Change in appetite 2 - 3 0 2  Feeling bad or failure about yourself  3 - 3 3 3   Trouble concentrating 3 - 3 3 3   Moving slowly or fidgety/restless 2 - 3 1 3   Suicidal thoughts 0 - 0 0 0  PHQ-9 Score 21 - 22 15 23   Difficult doing work/chores - Very difficult Somewhat difficult - Extremely dIfficult    Quality of Life:   Personal Goals: Goals established at orientation with interventions provided to work toward goal.  Personal Goals and Risk Factors at Admission - 10/11/19 1527  Core Components/Risk Factors/Patient Goals on Admission   Improve shortness of breath with ADL's Yes    Intervention Provide education, individualized exercise plan and daily activity instruction to help decrease symptoms of SOB with activities of daily living.    Expected Outcomes Short Term: Improve cardiorespiratory fitness to achieve a reduction of symptoms when performing ADLs;Long Term: Be able to perform more ADLs without symptoms or delay the onset of symptoms    Stress Yes    Intervention Offer individual and/or small group education and counseling on adjustment to heart disease, stress management and health-related lifestyle change. Teach and support self-help strategies.;Refer participants experiencing significant psychosocial distress to appropriate mental health specialists for further evaluation and treatment. When possible, include family members and significant others in  education/counseling sessions.    Expected Outcomes Short Term: Participant demonstrates changes in health-related behavior, relaxation and other stress management skills, ability to obtain effective social support, and compliance with psychotropic medications if prescribed.;Long Term: Emotional wellbeing is indicated by absence of clinically significant psychosocial distress or social isolation.            Personal Goals Discharge:  Goals and Risk Factor Review    Row Name 10/31/19 1504 11/26/19 1200           Core Components/Risk Factors/Patient Goals Review   Personal Goals Review Improve shortness of breath with ADL's;Develop more efficient breathing techniques such as purse lipped breathing and diaphragmatic breathing and practicing self-pacing with activity.;Increase knowledge of respiratory medications and ability to use respiratory devices properly.;Stress Develop more efficient breathing techniques such as purse lipped breathing and diaphragmatic breathing and practicing self-pacing with activity.;Increase knowledge of respiratory medications and ability to use respiratory devices properly.;Improve shortness of breath with ADL's;Stress      Review Hosam has completed 1 exercise session on 10/12.  Out the second week due to "bad breathing week"  pt had hearing on yesterday to see if Medicaid will reverse the decision not to pay for his procedure Zephyr valve placement at [facility] to improve his lung capacity.  His inability to work, no income has caused hi increased stress which impacts his breathing.  Not enough data to review  for progress Nathaneil's attendance has been poor, he has attended 3 exercise sessions in 1 month, his strength, stamina, and shortness of breath has not improved at this point.      Expected Outcomes See Admission Goals For Javis to regularly atten pulmonary rehab so as to meet program goals.             Exercise Goals and Review:  Exercise Goals    Row Name 10/11/19 1517  10/12/19 1540           Exercise Goals   Increase Physical Activity Yes Yes      Intervention Provide advice, education, support and counseling about physical activity/exercise needs.;Develop an individualized exercise prescription for aerobic and resistive training based on initial evaluation findings, risk stratification, comorbidities and participant's personal goals. Provide advice, education, support and counseling about physical activity/exercise needs.;Develop an individualized exercise prescription for aerobic and resistive training based on initial evaluation findings, risk stratification, comorbidities and participant's personal goals.      Expected Outcomes Short Term: Attend rehab on a regular basis to increase amount of physical activity.;Long Term: Exercising regularly at least 3-5 days a week.;Long Term: Add in home exercise to make exercise part of routine and to increase amount of physical activity. Short Term: Attend rehab on a regular basis to increase  amount of physical activity.;Long Term: Add in home exercise to make exercise part of routine and to increase amount of physical activity.;Long Term: Exercising regularly at least 3-5 days a week.      Increase Strength and Stamina Yes Yes      Intervention Provide advice, education, support and counseling about physical activity/exercise needs.;Develop an individualized exercise prescription for aerobic and resistive training based on initial evaluation findings, risk stratification, comorbidities and participant's personal goals. Provide advice, education, support and counseling about physical activity/exercise needs.;Develop an individualized exercise prescription for aerobic and resistive training based on initial evaluation findings, risk stratification, comorbidities and participant's personal goals.      Expected Outcomes Short Term: Increase workloads from initial exercise prescription for resistance, speed, and METs.;Short Term:  Perform resistance training exercises routinely during rehab and add in resistance training at home;Long Term: Improve cardiorespiratory fitness, muscular endurance and strength as measured by increased METs and functional capacity ( ) Short Term: Increase workloads from initial exercise prescription for resistance, speed, and METs.;Short Term: Perform resistance training exercises routinely during rehab and add in resistance training at home;Long Term: Improve cardiorespiratory fitness, muscular endurance and strength as measured by increased METs and functional capacity ( )      Able to understand and use rate of perceived exertion (RPE) scale Yes Yes      Intervention Provide education and explanation on how to use RPE scale Provide education and explanation on how to use RPE scale      Expected Outcomes Short Term: Able to use RPE daily in rehab to express subjective intensity level;Long Term:  Able to use RPE to guide intensity level when exercising independently Short Term: Able to use RPE daily in rehab to express subjective intensity level;Long Term:  Able to use RPE to guide intensity level when exercising independently      Able to understand and use Dyspnea scale Yes Yes      Intervention Provide education and explanation on how to use Dyspnea scale Provide education and explanation on how to use Dyspnea scale      Expected Outcomes Short Term: Able to use Dyspnea scale daily in rehab to express subjective sense of shortness of breath during exertion;Long Term: Able to use Dyspnea scale to guide intensity level when exercising independently Short Term: Able to use Dyspnea scale daily in rehab to express subjective sense of shortness of breath during exertion;Long Term: Able to use Dyspnea scale to guide intensity level when exercising independently      Knowledge and understanding of Target Heart Rate Range (THRR) Yes Yes      Intervention Provide education and explanation of THRR including  how the numbers were predicted and where they are located for reference Provide education and explanation of THRR including how the numbers were predicted and where they are located for reference      Expected Outcomes Short Term: Able to state/look up THRR;Long Term: Able to use THRR to govern intensity when exercising independently;Short Term: Able to use daily as guideline for intensity in rehab Short Term: Able to state/look up THRR;Long Term: Able to use THRR to govern intensity when exercising independently;Short Term: Able to use daily as guideline for intensity in rehab      Understanding of Exercise Prescription Yes Yes      Intervention Provide education, explanation, and written materials on patient's individual exercise prescription Provide education, explanation, and written materials on patient's individual exercise prescription      Expected Outcomes Short Term:  Able to explain program exercise prescription;Long Term: Able to explain home exercise prescription to exercise independently Short Term: Able to explain program exercise prescription;Long Term: Able to explain home exercise prescription to exercise independently             Exercise Goals Re-Evaluation:  Exercise Goals Re-Evaluation    Row Name 11/01/19 0746 11/22/19 0911           Exercise Goal Re-Evaluation   Exercise Goals Review Increase Physical Activity;Increase Strength and Stamina;Able to understand and use rate of perceived exertion (RPE) scale;Able to understand and use Dyspnea scale;Knowledge and understanding of Target Heart Rate Range (THRR);Understanding of Exercise Prescription Increase Physical Activity;Increase Strength and Stamina;Able to understand and use rate of perceived exertion (RPE) scale;Able to understand and use Dyspnea scale;Knowledge and understanding of Target Heart Rate Range (THRR);Understanding of Exercise Prescription      Comments Pt has only completed 1 exercise session and has had a few  absences since then. Pt is deconditioned and needs a lot of one-on-one time. Will continue to monitor and progress as able. Pt has completed 3 exercise sessions in 40 days. Pt has not made any progressions with workloads or METS due to attendance. Will continue to monitor and progress as able.      Expected Outcomes Through exercise at rehab and home the patient will decrease shortness of breath with daily activities and feel confident in carrying out an exercise regimn at home. Through exercise at rehab and home the patient will decrease shortness of breath with daily activities and feel confident in carrying out an exercise regimn at home.             Nutrition & Weight - Outcomes:  Pre Biometrics - 10/11/19 1644      Pre Biometrics   Grip Strength 47 kg            Nutrition:  Nutrition Therapy & Goals - 11/15/19 1341      Nutrition Therapy   Diet Heart healthy      Personal Nutrition Goals   Nutrition Goal Pt to build a healthy plate including vegetables, fruits, whole grains, and low-fat dairy products in a heart healthy meal plan.    Personal Goal #2 Pt to eat meals when he feels the best    Personal Goal #3 Pt to increase water intake to minimum of 64 oz (from 32-48 oz per day)      Intervention Plan   Intervention Prescribe, educate and counsel regarding individualized specific dietary modifications aiming towards targeted core components such as weight, hypertension, lipid management, diabetes, heart failure and other comorbidities.    Expected Outcomes Short Term Goal: Understand basic principles of dietary content, such as calories, fat, sodium, cholesterol and nutrients.           Nutrition Discharge:   Education Questionnaire Score:  Knowledge Questionnaire Score - 10/11/19 1642      Knowledge Questionnaire Score   Pre Score 15/18           Goals reviewed with patient; copy given to patient.

## 2019-12-27 NOTE — Addendum Note (Signed)
Encounter addended by: Drema Pry, RN on: 12/27/2019 9:33 AM  Actions taken: Clinical Note Signed, Episode resolved

## 2020-01-03 ENCOUNTER — Telehealth: Payer: Self-pay | Admitting: Family Medicine

## 2020-01-03 ENCOUNTER — Other Ambulatory Visit: Payer: Self-pay | Admitting: Critical Care Medicine

## 2020-01-03 DIAGNOSIS — G4733 Obstructive sleep apnea (adult) (pediatric): Secondary | ICD-10-CM

## 2020-01-03 NOTE — Telephone Encounter (Signed)
Hi Dr Nunzio Cobbs, can you please review this thread.

## 2020-01-03 NOTE — Telephone Encounter (Signed)
Can you please forward this to Dr. Nunzio Cobbs as I have not seen this patient before. Thank you. I called this patient today about 12:30. This patient reports that he is receiving an endobronchial valve on January 20th. He reports that [facility] wanted him to get testing for penicillin allergy. Patient reports that he had an unknown reaction to penicillin when he was a child and has avoided it since then. He further reports that the valve manufacturer is requesting testing to the materials that will be implanted including nickel, titanium, silicone, and nitinol. Patient notified that we have patch testing to nickel and titanium, however, we do not have tests for silicone or nitinol. He is going to get in touch with the manufacturer for further recommendations regarding where they have obtained testing to these materials for previous patients. As it stands right now he will keep the appointment for Monday Jan 3 and have metals patches placed, we will address penicillin testing the following week, and he will contact the manufacturer for further recommendations about the materials in the endotracheal valve. Please let me know if you have any further recommendations. Thank you

## 2020-01-03 NOTE — Telephone Encounter (Signed)
The manufacturer would need to provide the sample of the silicon and nitinol for patch testing. I agree with pt coming in for metal patch testing on Monday and PCN testing/challenge the following week. Please be sure we have the metal patch test and PCN for testing and challenging. Thanks.

## 2020-01-03 NOTE — Telephone Encounter (Signed)
Thank you :)

## 2020-01-03 NOTE — Telephone Encounter (Signed)
PT has an appt on monday 1/3 at 2:30 for office visit scheduled by [facility]. PT called back to confirm that he would be getting testing for Penicillin, nickel, titanium, silicone, nitinol for a surgery on january 18th. Advise he was not schedule for testing and there are multiple tests that would have to be performed. Please confirm how to schedule this patient. PT aware he would have to come M-W-F for the nickel and titanium patches. Unsure if penicillin testing is skin test or challenge? Unsure if we test for silicone or nitinol?   Jeryl [phone]

## 2020-01-05 DIAGNOSIS — G4733 Obstructive sleep apnea (adult) (pediatric): Secondary | ICD-10-CM | POA: Diagnosis not present

## 2020-01-07 ENCOUNTER — Encounter: Payer: Self-pay | Admitting: Family Medicine

## 2020-01-07 ENCOUNTER — Ambulatory Visit (INDEPENDENT_AMBULATORY_CARE_PROVIDER_SITE_OTHER): Payer: Medicaid Other | Admitting: Family Medicine

## 2020-01-07 ENCOUNTER — Other Ambulatory Visit: Payer: Self-pay

## 2020-01-07 VITALS — BP 104/72 | HR 82 | Temp 97.3°F | Resp 16

## 2020-01-07 DIAGNOSIS — Z880 Allergy status to penicillin: Secondary | ICD-10-CM

## 2020-01-07 DIAGNOSIS — L230 Allergic contact dermatitis due to metals: Secondary | ICD-10-CM | POA: Diagnosis not present

## 2020-01-07 DIAGNOSIS — L500 Allergic urticaria: Secondary | ICD-10-CM | POA: Diagnosis not present

## 2020-01-07 DIAGNOSIS — Z91018 Allergy to other foods: Secondary | ICD-10-CM

## 2020-01-07 MED ORDER — PENICILLIN V POTASSIUM 250 MG/5ML PO SOLR: ORAL | 0 refills | Status: DC

## 2020-01-07 NOTE — Progress Notes (Signed)
[address] [place] [address] Dept: [phone]  FOLLOW UP NOTE  Patient ID: Richard Fuentes, male    DOB: 05-23-1970  Age: 50 y.o. MRN: [id number] Date of Office Visit: 01/07/2020  Assessment  Chief Complaint: Follow-up  HPI Richard Fuentes is a 50 year old male who presents to the clinic for follow-up visit with the intention of metals patch testing placement. He is in the process of checking off items required before receiving endobronchial valve surgery that is currently scheduled for January, 20, 2022. He has an appointment to receive a COVID vaccine in the morning as he is required to be fully vaccinated before the date of surgery on January 24, 2020. . It was agreed today, that metals patch placement would be rescheduled on next Monday and penicillin testing would be scheduled for Thursday. He verbalizes agreement with this plan. He reports that he was told at a very young age that he had an allergic reaction to penicillin involving rash, swelling, and hypotension. He is unsure of the treatment he received for this reaction. 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 His current medications are listed in the chart.   Drug Allergies:  Allergies  Allergen Reactions  . Other Anaphylaxis    Wool  . Penicillins Other (See Comments)    Childhood allergy  Has patient had a PCN reaction causing immediate rash, facial/tongue/throat swelling, SOB or lightheadedness with hypotension: n/a Has patient had a PCN reaction causing severe rash involving mucus membranes or skin necrosis: n/a Has patient had a PCN reaction that required hospitalization: n/a Has patient had a PCN reaction occurring within the last 10 years: n/a If all of the above answers are "NO", then may proceed with Cephalosporin use.   Marland Kitchen Shellfish Allergy     Physical Exam: BP 104/72   Pulse 82   Temp (!) 97.3 F (36.3 C) (Tympanic)   Resp 16   SpO2 99%    Physical Exam Vitals reviewed.   Constitutional:      Appearance: Normal appearance.  HENT:     Head: Normocephalic and atraumatic.     Right Ear: Tympanic membrane normal.     Left Ear: Tympanic membrane normal.     Nose:     Comments: Bilateral nares normal. Pharynx normal. Eyes normal. Eyes:     Conjunctiva/sclera: Conjunctivae normal.  Cardiovascular:     Heart sounds: No murmur heard.   Musculoskeletal:        General: Normal range of motion.     Cervical back: Normal range of motion and neck supple.  Skin:    General: Skin is warm.     Comments: Red, rough patches on extensor surfaces of bilateral elbows.   Neurological:     Mental Status: He is alert and oriented to person, place, and time.  Psychiatric:        Mood and Affect: Mood normal.        Behavior: Behavior normal.        Thought Content: Thought content normal.        Judgment: Judgment normal.       Assessment and Plan: 1. 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 History of penicillin allergy   3. Allergic urticaria   4. History of food allergy     Patient Instructions  Allergic contact dermatitis to metals Return next week on Monday for metals patch placement  Penicillin allergy Return to the clinic on Thursday for skin  testing and possible oral challenge to penicillin. I will send an order into your pharmacy for penicillin that you will bring to your appointment.  Call the clinic if this treatment plan is not working well for you  Follow up in 3 days or sooner if needed.    Return in about 3 days (around 01/10/2020).    Thank you for the opportunity to care for this patient.  Please do not hesitate to contact me with questions.  Thermon Leyland, FNP [facility]

## 2020-01-07 NOTE — Patient Instructions (Addendum)
Allergic contact dermatitis to metals Return next week on Monday for metals patch placement  Penicillin allergy Return to the clinic on Thursday for skin testing and possible oral challenge to penicillin. I will send an order into your pharmacy for penicillin that you will bring to your appointment.  Call the clinic if this treatment plan is not working well for you  Follow up in 3 days or sooner if needed.

## 2020-01-08 ENCOUNTER — Other Ambulatory Visit: Payer: Self-pay | Admitting: Critical Care Medicine

## 2020-01-08 MED FILL — PANTOPRAZOLE SOD DR 40 MG T: 40 | 30 days supply | Qty: 30 | Fill #0

## 2020-01-08 MED FILL — $VENTOLIN HFA 18G INHALER: 108 (90 BAS | 50 days supply | Qty: 36 | Fill #0

## 2020-01-09 ENCOUNTER — Encounter: Payer: Medicaid Other | Admitting: Allergy and Immunology

## 2020-01-09 ENCOUNTER — Telehealth: Payer: Self-pay | Admitting: Family Medicine

## 2020-01-09 ENCOUNTER — Other Ambulatory Visit: Payer: Self-pay

## 2020-01-09 ENCOUNTER — Ambulatory Visit (HOSPITAL_BASED_OUTPATIENT_CLINIC_OR_DEPARTMENT_OTHER): Payer: Medicaid Other | Attending: Critical Care Medicine | Admitting: Internal Medicine

## 2020-01-09 DIAGNOSIS — G4733 Obstructive sleep apnea (adult) (pediatric): Secondary | ICD-10-CM | POA: Insufficient documentation

## 2020-01-09 DIAGNOSIS — I4891 Unspecified atrial fibrillation: Secondary | ICD-10-CM | POA: Diagnosis not present

## 2020-01-09 NOTE — Telephone Encounter (Signed)
Returning call from earlier today. Will try again tomorrow morning.

## 2020-01-09 NOTE — Telephone Encounter (Signed)
Requested medication (s) are due for refill today: yes  Requested medication (s) are on the active medication list: yes  Last refill: 09/05/19  #60 3 refills  Future visit scheduled no  Notes to clinic: off protocol  Requested Prescriptions  Pending Prescriptions Disp Refills   TRELEGY ELLIPTA 200-62.5-25 MCG/INH AEPB [Pharmacy Med Name: Harrel Carina 200-62.5-25 200-62.5-25 Aerosol] 240 each 3    Sig: Inhale 1 puff into the lungs daily.      Off-Protocol Failed - 01/08/2020  4:50 PM      Failed - Medication not assigned to a protocol, review manually.      Passed - Valid encounter within last 12 months    Recent Outpatient Visits           2 months ago Panlobular emphysema [facility])   [facility] Storm Frisk, MD   3 months ago Panlobular emphysema [facility])   [facility] Storm Frisk, MD   5 months ago Centrilobular emphysema [facility])   [place] Storm Frisk, MD   7 months ago Centrilobular emphysema [facility])   [facility] Storm Frisk[facility]   8 months ago Centrilobular emphysema [facility])   [facility] Storm Frisk, [facility]

## 2020-01-09 NOTE — Telephone Encounter (Signed)
Pt. had a sleep study and everywhere they applied tape he now has hives. He is aking what he should do as far as treatment for the hives since he has testing tomorrow  would like a call back.

## 2020-01-10 ENCOUNTER — Encounter: Payer: Self-pay | Admitting: Family

## 2020-01-10 ENCOUNTER — Telehealth: Payer: Self-pay | Admitting: Allergy

## 2020-01-10 ENCOUNTER — Ambulatory Visit (INDEPENDENT_AMBULATORY_CARE_PROVIDER_SITE_OTHER): Payer: Medicaid Other | Admitting: Family

## 2020-01-10 VITALS — BP 118/68 | HR 91 | Temp 97.1°F | Resp 22

## 2020-01-10 DIAGNOSIS — Z880 Allergy status to penicillin: Secondary | ICD-10-CM | POA: Diagnosis not present

## 2020-01-10 NOTE — Patient Instructions (Addendum)
History of penicillin allergy You were able to tolerate the percutaneous and intradermal challenge penicillin skin testing today.  You will need to return on January 21, 2020 and complete the oral challenge to penicillin V in order for [place] to make sure that you are no longer allergic. Return on (01/21/20) for the oral challenge to penicillin v. Make sure to not take this medication and to bring this medication to your appointment on 01/21/20  Also, return next week for metal patch testing. Make sure to stay off all oral steroids prior to this appointment.  Please let us know if this treatment plan is not working well for you. Schedule a follow up appointment 01/21/20 for oral penicillin challenge in [place] with me

## 2020-01-10 NOTE — Progress Notes (Cosign Needed Addendum)
[address] [place] [address] Dept: [phone]  FOLLOW UP NOTE  Patient ID: Richard Fuentes, male    DOB: December 19, 1970  Age: 50 y.o. MRN: [id number] Date of Office Visit: 01/10/2020  Assessment  Chief Complaint: Food/Drug Challenge  HPI Richard Fuentes is a 50 year old male who presents today for penicillin skin testing.  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  Prior to that he was seen on July 24, 2019 by Dr. Nunzio Cobbs for COPD with emphysema, perennial and seasonal allergic rhinitis, recurrent urticaria, history of penicillin allergy, history of food allergy.  On January 24, 2020 he will be having endobronchial valve surgery.  He is in the process of checking off items required prior to the surgery.  He reports an allergy to penicillin as a child, but is unaware of the reaction.  The chart does mention an allergic reaction to penicillin involving rash, swelling, and hypotension.  He forgot to pick up the penicillin V from the pharmacy that was prescribed by our office.  He reports that he is in good health today.  He is no longer having any rashes or hives from the sleep study electrodes he had the night before.  He denies any gastrointestinal symptoms.  His breathing is reported as at baseline.  He reports a non productive cough, wheezing, and shortness of breath.  He denies any tightness in his chest.  Yesterday he used his albuterol inhaler 7-8 times.   Drug Allergies:  Allergies  Allergen Reactions  . Other Anaphylaxis    Wool  . Penicillins Other (See Comments)    Childhood allergy  Has patient had a PCN reaction causing immediate rash, facial/tongue/throat swelling, SOB or lightheadedness with hypotension: n/a Has patient had a PCN reaction causing severe rash involving mucus membranes or skin necrosis: n/a Has patient had a PCN reaction that  required hospitalization: n/a Has patient had a PCN reaction occurring within the last 10 years: n/a If all of the above answers are "NO", then may proceed with Cephalosporin use.   . Shellfish Allergy     Review of Systems: Review of Systems  Constitutional: Negative for chills and fever.  HENT:       Denies rhinorrhea, nasal congestion, and postnasal drip  Eyes:       Denies itchy watery eyes  Respiratory: Positive for cough, shortness of breath and wheezing.        Cough is reported as dry  Gastrointestinal: Negative for abdominal pain and heartburn.  Genitourinary: Negative for dysuria.  Skin: Positive for itching.       Reports itching due to psoriasis  Neurological: Negative for headaches.    Physical Exam: BP 118/68   Pulse 91   Temp (!) 97.1 F (36.2 C) (Tympanic)   Resp (!) 22   SpO2 97%    Physical Exam Constitutional:      Appearance: Normal appearance.  HENT:     Head: Normocephalic and atraumatic.     Comments: Pharynx normal, eyes normal, ears normal, nose normal    Right Ear: Tympanic membrane, ear canal and external ear normal.     Left Ear: Tympanic membrane, ear canal and external ear normal.     Nose: Nose normal.     Mouth/Throat:     Mouth: Mucous membranes are moist.     Pharynx: Oropharynx is clear.  Eyes:  Conjunctiva/sclera: Conjunctivae normal.  Cardiovascular:     Rate and Rhythm: Regular rhythm.     Heart sounds: Normal heart sounds.  Pulmonary:     Comments: Bilateral upper lobes clear to auscultation.  Bilateral lower lobes diminished Skin:    General: Skin is warm.     Comments: No hives or urticarial lesions noted. Red rough patches on extensor surfaces of bilateral elbows and left hand knuckle region  Neurological:     Mental Status: He is alert and oriented to person, place, and time.  Psychiatric:        Mood and Affect: Mood normal.        Behavior: Behavior normal.        Thought Content: Thought content normal.         Judgment: Judgment normal.     Diagnostics:    Penicillin - 01/10/20 1054    Location Arm    Number of allergen test 9    Time Testing Placed 0944    Control SPT Negative    Histamine SPT Negative    Pre-Pen Puncture Negative    Penicillin-G 5000 u/ml SPT Negative    Time Testing Placed 1008    Control Intradermal Negative    Pre-Pen Intradermal Negative    Penicillin-G 50 u/ml Intradermal Negative    Time Testing Placed 1028    Penicillin-G 500u/ml Intradermal Negative    Time Testing Placed 1053    Penicillin-G 5000 u/ml Intradermal Negative            Assessment and Plan: 1. History of penicillin allergy     No orders of the defined types were placed in this encounter.   Patient Instructions  History of penicillin allergy You were able to tolerate the percutaneous and intradermal challenge penicillin skin testing today.  You will need to return on January 21, 2020 and complete the oral challenge to penicillin V in order for [place] to make sure that you are no longer allergic. Return on (01/21/20) for the oral challenge to penicillin v. Make sure to not take this medication and to bring this medication to your appointment on 01/21/20  Also, return next week for metal patch testing. Make sure to stay off all oral steroids prior to this appointment.  Please let us know if this treatment plan is not working well for you. Schedule a follow up appointment 01/21/20 for oral penicillin challenge in [place] with me   Return in about 4 days (around 01/14/2020) for patch reading.    Thank you for the opportunity to care for this patient.  Please do not hesitate to contact me with questions.  Nehemiah Settle, FNP [facility]

## 2020-01-10 NOTE — Telephone Encounter (Signed)
Late entry Called by patient on 01/09/2020 evening.  He states he had a sleep study on 01/09/2020 where they used latex tape/electrodes for the sleep study.  He states he was unaware he was allergic to latex.  He states where the electrodes were where the latex adhesive was he has a high hive-like rash.  It is all over his body where the electrodes were.  He states on his arms however he has areas right above his wrist.  He was wanting to know what he could do.  He did not want to take any antihistamine as he has penicillin testing scheduled for 01/10/2020 that he states he has to get done and patch testing the following week.  I advised them that he could use steroid cream if he had it.  He states he had hydrocortisone cream at home.  He also says he has Benadryl cream.  I asked him to use the hydrocortisone cream first to see if that would help with itch control.  If necessary he could use the Benadryl cream in the area is aware of the itch and rash are.  I do not believe the Benadryl cream would interfere with skin testing as long as he is not applying it over the area where the skin testing is placed.  I did discuss this with a nurse practitioner Neysa Bonito who is doing his testing today.

## 2020-01-10 NOTE — Telephone Encounter (Signed)
Pt was seen in clinic today with c dale fnp

## 2020-01-11 ENCOUNTER — Encounter: Payer: Medicaid Other | Admitting: Family Medicine

## 2020-01-11 ENCOUNTER — Other Ambulatory Visit: Payer: Self-pay | Admitting: Family Medicine

## 2020-01-14 ENCOUNTER — Encounter: Payer: Self-pay | Admitting: Family Medicine

## 2020-01-14 ENCOUNTER — Ambulatory Visit (INDEPENDENT_AMBULATORY_CARE_PROVIDER_SITE_OTHER): Payer: Medicaid Other | Admitting: Family Medicine

## 2020-01-14 ENCOUNTER — Other Ambulatory Visit: Payer: Self-pay

## 2020-01-14 DIAGNOSIS — L230 Allergic contact dermatitis due to metals: Secondary | ICD-10-CM | POA: Diagnosis not present

## 2020-01-14 NOTE — Addendum Note (Signed)
Addended by: Florence Canner on: 01/14/2020 05:07 PM   Modules accepted: Orders

## 2020-01-14 NOTE — Patient Instructions (Signed)
Allergic contact dermatitis Metals patches placed - Instructions provided on care of the patches for the next 48 hours. Richard Fuentes was instructed to avoid showering for the next 48 hours. Richard Fuentes will follow up in 48 hours and 96 hours for patch readings.   Call the clinic if this treatment plan is not working well for you  Follow up in 2 days or sooner if needed.  Thank you for the opportunity to care for this patient.  Please do not hesitate to contact me with questions.

## 2020-01-14 NOTE — Progress Notes (Signed)
Follow-up Note  RE: Richard Fuentes MRN: [id number] DOB: Jun 03, 1970 Date of Office Visit: 01/14/2020  Primary care provider: Claiborne Rigg, NP Referring provider: Claiborne Rigg, NP   [name] returns to the office today for the patch test placement, given suspected history of contact dermatitis.    Diagnostics: Metals testing patches placed.    Plan:   Allergic contact dermatitis - Instructions provided on care of the patches for the next 48 hours. [name] was instructed to avoid showering for the next 48 hours. [name] will follow up in 48 hours and 96 hours for patch readings.   Call the clinic if this treatment plan is not working well for you  Follow up in 2 days or sooner if needed.  Thank you for the opportunity to care for this patient.  Please do not hesitate to contact me with questions.  Thermon Leyland, FNP [facility]

## 2020-01-15 NOTE — Addendum Note (Signed)
Addended by: Berna Bue on: 01/15/2020 01:43 PM   Modules accepted: Orders

## 2020-01-16 ENCOUNTER — Other Ambulatory Visit: Payer: Self-pay

## 2020-01-16 ENCOUNTER — Ambulatory Visit: Payer: Medicaid Other | Admitting: Allergy and Immunology

## 2020-01-16 ENCOUNTER — Encounter: Payer: Self-pay | Admitting: Allergy and Immunology

## 2020-01-16 DIAGNOSIS — L230 Allergic contact dermatitis due to metals: Secondary | ICD-10-CM

## 2020-01-16 NOTE — Progress Notes (Signed)
    Follow-up Note  RE: Alwin Lanigan MRN: [id number] DOB: 08/13/1970 Date of Office Visit: 01/16/2020  Primary care provider: Claiborne Rigg, NP Referring provider: Claiborne Rigg, NP   Tylon returns to the office today for the initial patch test interpretation, given suspected history of contact dermatitis.    Diagnostics:  METAL TEST 48 hour reading: All negative.  Plan:   Follow-up in 2 days for next patch test reading/interpretation.

## 2020-01-16 NOTE — Patient Instructions (Signed)
Allergic contact dermatitis due to metals Metal patch test negative at 48-hours.  Follow-up in 2 days for next patch test reading/interpretation.   Return in about 2 days (around 01/18/2020) for metal patch test interpretation.

## 2020-01-16 NOTE — Assessment & Plan Note (Signed)
Metal patch test negative at 48-hours.  Follow-up in 2 days for next patch test reading/interpretation.

## 2020-01-18 ENCOUNTER — Other Ambulatory Visit: Payer: Self-pay

## 2020-01-18 ENCOUNTER — Encounter: Payer: Self-pay | Admitting: Family Medicine

## 2020-01-18 ENCOUNTER — Ambulatory Visit (INDEPENDENT_AMBULATORY_CARE_PROVIDER_SITE_OTHER): Payer: Medicaid Other | Admitting: Family Medicine

## 2020-01-18 DIAGNOSIS — L230 Allergic contact dermatitis due to metals: Secondary | ICD-10-CM

## 2020-01-18 NOTE — Patient Instructions (Signed)
Allergic contact dermatitis No reactions were detected to the metals that were tested.   Call the clinic if this treatment plan is not working well for you  Follow up on 01/21/2020 for oral penicillin challenge or sooner if needed.

## 2020-01-18 NOTE — Progress Notes (Addendum)
    Follow-up Note  RE: Richard Fuentes MRN: [id number] DOB: 1970/04/04 Date of Office Visit: 01/18/2020  Primary care provider: Claiborne Rigg, NP Referring provider: Claiborne Rigg, NP   Legacy returns to the office today for the final patch test interpretation, given suspected history of contact dermatitis.    Diagnostics:   Metals patch testing and paper clip: 96 hour reading  Metals Patch     Time Antigen Placed  01/14/2020    Location  Back    Reading Interval  96 hr reading    Chromium chloride 1%  0     Cobalt chloride hexahydrate 1%  0     Molybdenum chloride 0.5%  0     Nickel sulfate hexahydrate 5%  0     Potassium dichromate 0.25%  0     Copper sulfate pentahydrate 2%  0     Titanium 0.1%  0     Manganese chloride 0.5%  0     Tantal 1%  0    Vanadium pentoxide 10%  0   Aluminum hydroxide 10%  0    Nickel sulfate hexahydrate 5%  0     Paper Clip  0    Plan:   Allergic contact dermatitis No reactions were detected to the metals that were tested.  Call the clinic if this treatment plan is not working well for you  Follow up on 01/21/2020 for oral penicillin challenge or sooner if needed.  Thank you for the opportunity to care for this patient.  Please do not hesitate to contact me with questions.  Thermon Leyland, FNP [facility]

## 2020-01-20 DIAGNOSIS — G4733 Obstructive sleep apnea (adult) (pediatric): Secondary | ICD-10-CM

## 2020-01-20 NOTE — Procedures (Addendum)
Patient Name: Richard Fuentes, Richard Fuentes Date: 01/09/2020 Gender: Male D.O.B: 03-05-1970 Age (years): 49 Referring Provider: Shan Levans Height (inches): 73 Interpreting Physician: Jetty Duhamel MD, ABSM Weight (lbs): 185 RPSGT: [place] S[place] BMI: 24 MRN: [id number] Neck Size: 16.00  CLINICAL INFORMATION Sleep Study Type: NPSG Indication for sleep study: OSA Epworth Sleepiness Score: 4  Most recent polysomnogram dated 08/11/2019 revealed an AHI of 21.7/h and RDI of 21.7/h.  SLEEP STUDY TECHNIQUE As per the AASM Manual for the Scoring of Sleep and Associated Events v2.3 (April 2016) with a hypopnea requiring 4% desaturations.  The channels recorded and monitored were frontal, central and occipital EEG, electrooculogram (EOG), submentalis EMG (chin), nasal and oral airflow, thoracic and abdominal wall motion, anterior tibialis EMG, snore microphone, electrocardiogram, and pulse oximetry.  MEDICATIONS Medications self-administered by patient taken the night of the study : N/A  SLEEP ARCHITECTURE The study was initiated at 9:06:05 AM and ended at 3:06:13 PM.  Sleep onset time was 50.9 minutes and the sleep efficiency was 68.4%%. The total sleep time was 246.5 minutes.  Stage REM latency was 82.5 minutes.  The patient spent 17.8%% of the night in stage N1 sleep, 57.6%% in stage N2 sleep, 0.0%% in stage N3 and 24.5% in REM.  Alpha intrusion was absent.  Supine sleep was 61.70%.  RESPIRATORY PARAMETERS The overall apnea/hypopnea index (AHI) was 5.6 per hour. There were 0 total apneas, including 0 obstructive, 0 central and 0 mixed apneas. There were 23 hypopneas and 38 RERAs.  The AHI during Stage REM sleep was 4.0 per hour.  AHI while supine was 8.7 per hour.  The mean oxygen saturation was 95.0%. The minimum SpO2 during sleep was 87.0%.  loud snoring was noted during this study.  CARDIAC DATA The 2 lead EKG demonstrated sinus rhythm. The mean heart rate was 65.3  beats per minute. Other EKG findings include: Atrial Fibrillation.  LEG MOVEMENT DATA The total PLMS were 0 with a resulting PLMS index of 0.0. Associated arousal with leg movement index was 1.2 .  IMPRESSIONS - This study was performed as a daytime study to accomodate patient's usual sleep schedule.  - A Split-Night study was ordered. Patient had a diagnostic NPSG on 08/11/2019 with AHI 21.7/ hr and has been using home Autopap 6-15 cm H2O, but complains pressures are too high.  There were insufficient early events on the current study to meet protocol requiremnt for a Split CPAP titration. - Mild obstructive sleep apnea occurred during this study (AHI = 5.6/h). - No significant central sleep apnea occurred during this study (CAI = 0.0/h). - Mild oxygen desaturation was noted during this study (Min O2 = 87.0%). Mean O2 sat 95.2%. - The patient snored with loud snoring volume. - EKG findings include Atrial Fibrillation- unverified, single lead. - Clinically significant periodic limb movements did not occur during sleep. No significant associated arousals.  DIAGNOSIS - Obstructive Sleep Apnea (G47.33)  RECOMMENDATIONS - Treatment for mild OSA is directed at symptoms. Conservative measures may include observation, weight loss and sleep position off back. Other measures, including CPAP, a fitted oral appliance or ENT evaluation, would be based on clinical judgment. Suggest return for CPAP titration sleep study if CPAP is chosen, since patinet is not comfortable with current home settings. - Note significant difference in results between previous and current sleep studies.  - Be careful with alcohol, sedatives and other CNS depressants that may worsen sleep apnea and disrupt normal sleep architecture. - Sleep hygiene should be reviewed to  assess factors that may improve sleep quality. - Weight management and regular exercise should be initiated or continued if appropriate.  [Electronically signed]  01/20/2020 01:11 PM  Jetty Duhamel MD, ABSM Diplomate, American Board of Sleep Medicine   NPI: [id number]                         Jetty Duhamel Diplomate, American Board of Sleep Medicine  ELECTRONICALLY SIGNED ON:  01/20/2020, 12:56 PM [facility] PH: [phone]   FX: [phone] ACCREDITED BY THE AMERICAN ACADEMY OF SLEEP MEDICINE

## 2020-01-21 ENCOUNTER — Encounter: Payer: Self-pay | Admitting: Family Medicine

## 2020-01-21 ENCOUNTER — Telehealth: Payer: Self-pay | Admitting: Critical Care Medicine

## 2020-01-21 ENCOUNTER — Encounter: Payer: Self-pay | Admitting: Critical Care Medicine

## 2020-01-21 ENCOUNTER — Encounter: Payer: Self-pay | Admitting: Family

## 2020-01-21 NOTE — Telephone Encounter (Signed)
I connected with this patient and gave him the results of his sleep study.  He was not a complete full split-night study.  He shows only mild sleep apnea on this study.  He is going to have his valves placed in the lungs to deflate the lungs at [facility] February 10 and I told him to hold off using CPAP until that point in time I will send orders to the homecare company to adjust his pressure down with a machine

## 2020-01-28 ENCOUNTER — Encounter: Payer: Self-pay | Admitting: Family

## 2020-01-29 NOTE — Progress Notes (Signed)
Patient had his second COVID-19 vaccination on 01/29/20 in the PM. He will need to reschedule his oral penicillin challenge.  On February 14, 2020 he will be having endobronchial valve surgery.   Patient had some type of reaction as a child after taking penicillin - broke out in hives possibly. He is not sure about exact events or symptoms. Since then he has been avoiding penicillin.  Rescheduled for 02/06/20 at 8:30AM 

## 2020-01-30 ENCOUNTER — Other Ambulatory Visit: Payer: Self-pay

## 2020-01-30 ENCOUNTER — Encounter: Payer: Self-pay | Admitting: Allergy

## 2020-01-30 ENCOUNTER — Ambulatory Visit: Payer: Medicaid Other | Admitting: Allergy

## 2020-01-30 NOTE — Progress Notes (Signed)
Patient had his second COVID-19 vaccination on 01/29/20 in the PM. He will need to reschedule his oral penicillin challenge.  On February 14, 2020 he will be having endobronchial valve surgery.   Patient had some type of reaction as a child after taking penicillin - broke out in hives possibly. He is not sure about exact events or symptoms. Since then he has been avoiding penicillin.  Rescheduled for 02/06/20 at 8:30AM

## 2020-02-05 DIAGNOSIS — G4733 Obstructive sleep apnea (adult) (pediatric): Secondary | ICD-10-CM | POA: Diagnosis not present

## 2020-02-05 NOTE — Progress Notes (Addendum)
Follow Up Note  RE: Richard Fuentes MRN: [id number] DOB: 1970/10/05 Date of Office Visit: 02/06/2020  Referring provider: Claiborne Rigg, NP Primary care provider: Claiborne Rigg, NP  Chief Complaint: Si Raider Challenge (Penicillin)  Assessment and Plan: Richard Fuentes is a 50 y.o. male with: Drug reaction  Patient tolerated 10.35mL of penicillin V (250mg /57mL) today without any issues. Vitals stable throughout.   For next 24 hours monitor for hives, swelling, shortness of breath and dizziness. If you see these symptoms, use Benadryl for mild symptoms and epinephrine for more severe symptoms and call 911.  If no adverse symptoms in the next 24 hours then he may take penicillin type of antibiotics in the future.  His risk of developing a future allergy to penicillin is the same as the general population's.   Return in about 4 months (around 06/05/2020).  Plan: Challenge drug: penicillin v 250mg /83mL  Challenge as per protocol: Passed Total time: 123 min  History of Present Illness: I had the pleasure of seeing Richard Fuentes for a follow up visit at the [facility] on 02/06/2020. He is a 50 y.o. male, who is being followed for dermatitis and drug allergy. His previous allergy office visit was on 01/18/2020 with 42, FNP. Today he is here for penicillin drug challenge.   History of Reaction: On February 14, 2020 he will be havingendobronchial valve surgery.  Patient had some type of reaction as a child after taking penicillin - broke out in hives possibly. He is not sure about exact events or symptoms. Since then he has been avoiding penicillin.   Interval History: Patient has not been ill, he has not had any accidental exposures to the culprit medication.   Recent/Current History: Pulmonary disease: yes - stage IV emphysema.   Cardiac disease: no Respiratory infection: no Rash: no Itch: no Swelling: no Cough: yes at baseline Shortness of breath: yes at  baseline Runny/stuffy nose: no Itchy eyes: no Beta-blocker use: no  Patient/guardian was informed of the test procedure with verbalized understanding of the risk of anaphylaxis. Consent was signed.   Last antihistamine use: none in the past 3 days Last beta-blocker use: none.  Medication List:  Current Outpatient Medications  Medication Sig Dispense Refill   fluticasone (FLONASE) 50 MCG/ACT nasal spray Place 2 sprays into both nostrils daily as needed for allergies or rhinitis. 18.2 mL 5   ipratropium-albuterol (DUONEB) 0.5-2.5 (3) MG/3ML SOLN Inhale into the lungs.     levocetirizine (XYZAL) 5 MG tablet Take 1 tablet once daily as needed for runny nose or itchy eyes. 34 tablet 5   pantoprazole (PROTONIX) 40 MG tablet Take 1 tablet (40 mg total) by mouth daily. 30 tablet 3   penicillin v potassium (VEETID) 250 MG/5ML solution Do not take this medication. Bring this medication to your allergy testing appointment. 100 mL 0   TRELEGY ELLIPTA 200-62.5-25 MCG/INH AEPB INHALE 1 PUFF INTO THE LUNGS DAILY. 60 each 3   VENTOLIN HFA 108 (90 Base) MCG/ACT inhaler INHALE 2 PUFFS INTO THE LUNGS EVERY 6 (SIX) HOURS AS NEEDED FOR WHEEZING OR SHORTNESS OF BREATH. 18 g 1   Vitamin D, Ergocalciferol, (DRISDOL) 1.25 MG (50000 UNIT) CAPS capsule Take 1 capsule (50,000 Units total) by mouth every 7 (seven) days. 12 capsule 1   No current facility-administered medications for this visit.   Allergies: Allergies  Allergen Reactions   Other Anaphylaxis    Wool   Shellfish Allergy    I reviewed his  past medical history, social history, family history, and environmental history and no significant changes have been reported from his previous visit.  Review of Systems  Constitutional: Negative for appetite change, chills, fever and unexpected weight change.  HENT: Negative for congestion and rhinorrhea.   Eyes: Negative for itching.  Respiratory: Negative for cough, chest tightness, shortness of  breath and wheezing.   Gastrointestinal: Negative for abdominal pain.  Skin: Positive for rash.  Neurological: Negative for headaches.   Objective: BP 110/82    Pulse 82    Temp 97.8 F (36.6 C) (Temporal)    Resp 18    Ht 6' (1.829 m)    SpO2 98%    BMI 26.77 kg/m  Body mass index is 26.77 kg/m. Physical Exam Vitals and nursing note reviewed.  Constitutional:      Appearance: Normal appearance. He is well-developed.  HENT:     Head: Normocephalic and atraumatic.     Right Ear: Tympanic membrane and external ear normal.     Left Ear: Tympanic membrane and external ear normal.     Nose: Nose normal.     Mouth/Throat:     Mouth: Mucous membranes are moist.     Pharynx: Oropharynx is clear.  Eyes:     Conjunctiva/sclera: Conjunctivae normal.  Cardiovascular:     Rate and Rhythm: Normal rate and regular rhythm.     Heart sounds: Normal heart sounds. No murmur heard.   Pulmonary:     Effort: Pulmonary effort is normal.     Breath sounds: No wheezing, rhonchi or rales.     Comments: Decreased breath sounds throughout. Musculoskeletal:     Cervical back: Neck supple.  Skin:    General: Skin is warm.     Findings: Rash present.     Comments: Erythematous with silvery scales on elbows b/l and hands.  Neurological:     Mental Status: He is alert and oriented to person, place, and time.  Psychiatric:        Behavior: Behavior normal.     Diagnostics: Results discussed with patient/family.  Oral Challenge - 02/06/20 1100    Challenge Food/Drug Penicillin    Food/Drug provided by patient    BP 110/70    Pulse 70    Respirations 18    Lungs clear    Skin clear    Mouth clear    Time 0930    Dose 0.1 mL    BP 112/78    Pulse 80    Respirations 18    Lungs clear    Skin clear    Mouth clear    Time 0945    Dose 1 mL    BP 120/60    Pulse 82    Respirations 18    Lungs clear    Skin clear    Mouth clear    Time 1000    Dose 9 mL    BP 116/70    Pulse 70     Respirations 18    Lungs clear    Skin clear    Mouth clear           Previous notes and tests were reviewed. The plan was reviewed with the patient/family, and all questions/concerned were addressed.  It was my pleasure to see Richard Fuentes today and participate in his care. Please feel free to contact me with any questions or concerns.  Sincerely,  Wyline Mood, DO Allergy & Immunology  [facility]  [facility] office: [phone] [facility] office: [phone]

## 2020-02-06 ENCOUNTER — Ambulatory Visit (INDEPENDENT_AMBULATORY_CARE_PROVIDER_SITE_OTHER): Payer: Medicaid Other | Admitting: Allergy

## 2020-02-06 ENCOUNTER — Other Ambulatory Visit: Payer: Self-pay

## 2020-02-06 ENCOUNTER — Encounter: Payer: Self-pay | Admitting: Allergy

## 2020-02-06 VITALS — BP 110/82 | HR 82 | Temp 97.8°F | Resp 18 | Ht 72.0 in

## 2020-02-06 DIAGNOSIS — Z880 Allergy status to penicillin: Secondary | ICD-10-CM | POA: Diagnosis not present

## 2020-02-06 DIAGNOSIS — T50905A Adverse effect of unspecified drugs, medicaments and biological substances, initial encounter: Secondary | ICD-10-CM | POA: Insufficient documentation

## 2020-02-06 DIAGNOSIS — T50905D Adverse effect of unspecified drugs, medicaments and biological substances, subsequent encounter: Secondary | ICD-10-CM

## 2020-02-06 NOTE — Patient Instructions (Addendum)
You tolerated 66mL of penicillin V (250mg /28mL) today without any issues.   For next 24 hours monitor for hives, swelling, shortness of breath and dizziness. If you see these symptoms, use Benadryl for mild symptoms and epinephrine for more severe symptoms and call 911.  If no adverse symptoms in the next 24 hours then your risk of developing a future allergy to penicillin is the same as the general population's.   Follow up in 4 months or sooner if needed for your other allergic conditions.

## 2020-02-06 NOTE — Assessment & Plan Note (Signed)
   Patient tolerated 10.16mL of penicillin V (250mg /40mL) today without any issues. Vitals stable throughout.   For next 24 hours monitor for hives, swelling, shortness of breath and dizziness. If you see these symptoms, use Benadryl for mild symptoms and epinephrine for more severe symptoms and call 911.  If no adverse symptoms in the next 24 hours then he may take penicillin type of antibiotics in the future.  His risk of developing a future allergy to penicillin is the same as the general population's.

## 2020-02-11 DIAGNOSIS — Z20822 Contact with and (suspected) exposure to covid-19: Secondary | ICD-10-CM | POA: Diagnosis not present

## 2020-02-11 DIAGNOSIS — Z01812 Encounter for preprocedural laboratory examination: Secondary | ICD-10-CM | POA: Diagnosis not present

## 2020-02-11 DIAGNOSIS — Z79899 Other long term (current) drug therapy: Secondary | ICD-10-CM | POA: Diagnosis not present

## 2020-02-11 DIAGNOSIS — Z7682 Awaiting organ transplant status: Secondary | ICD-10-CM | POA: Diagnosis not present

## 2020-02-11 DIAGNOSIS — G4733 Obstructive sleep apnea (adult) (pediatric): Secondary | ICD-10-CM | POA: Diagnosis not present

## 2020-02-11 DIAGNOSIS — Z87891 Personal history of nicotine dependence: Secondary | ICD-10-CM | POA: Diagnosis not present

## 2020-02-11 DIAGNOSIS — R918 Other nonspecific abnormal finding of lung field: Secondary | ICD-10-CM | POA: Diagnosis not present

## 2020-02-11 DIAGNOSIS — J432 Centrilobular emphysema: Secondary | ICD-10-CM | POA: Diagnosis not present

## 2020-02-11 DIAGNOSIS — Z01818 Encounter for other preprocedural examination: Secondary | ICD-10-CM | POA: Diagnosis not present

## 2020-02-11 DIAGNOSIS — R0602 Shortness of breath: Secondary | ICD-10-CM | POA: Diagnosis not present

## 2020-02-11 DIAGNOSIS — D649 Anemia, unspecified: Secondary | ICD-10-CM | POA: Diagnosis not present

## 2020-02-11 DIAGNOSIS — Z8616 Personal history of COVID-19: Secondary | ICD-10-CM | POA: Diagnosis not present

## 2020-02-11 DIAGNOSIS — R0789 Other chest pain: Secondary | ICD-10-CM | POA: Diagnosis not present

## 2020-02-12 DIAGNOSIS — F429 Obsessive-compulsive disorder, unspecified: Secondary | ICD-10-CM | POA: Diagnosis not present

## 2020-02-12 DIAGNOSIS — J449 Chronic obstructive pulmonary disease, unspecified: Secondary | ICD-10-CM | POA: Diagnosis not present

## 2020-02-12 DIAGNOSIS — G4700 Insomnia, unspecified: Secondary | ICD-10-CM | POA: Diagnosis not present

## 2020-02-12 DIAGNOSIS — F4310 Post-traumatic stress disorder, unspecified: Secondary | ICD-10-CM | POA: Diagnosis not present

## 2020-02-12 DIAGNOSIS — Z7682 Awaiting organ transplant status: Secondary | ICD-10-CM | POA: Diagnosis not present

## 2020-02-12 DIAGNOSIS — J432 Centrilobular emphysema: Secondary | ICD-10-CM | POA: Diagnosis not present

## 2020-02-12 DIAGNOSIS — Z79899 Other long term (current) drug therapy: Secondary | ICD-10-CM | POA: Diagnosis not present

## 2020-02-27 ENCOUNTER — Other Ambulatory Visit: Payer: Self-pay | Admitting: Nurse Practitioner

## 2020-02-27 NOTE — Telephone Encounter (Signed)
Medication Refill - Medication: TRELEGY ELLIPTA 200-62.5-25 MCG/INH AEPB     Preferred Pharmacy (with phone number or street name):  [facility] - [place], [place] - [address] Phone:  [phone]  Fax:  [phone]       Agent: Please be advised that RX refills may take up to 3 business days. We ask that you follow-up with your pharmacy.

## 2020-02-27 NOTE — Telephone Encounter (Signed)
Requested medication (s) are due for refill today - no  Requested medication (s) are on the active medication list -yes  Future visit scheduled -no  Last refill: 01/11/20  Notes to clinic: Request RF medication not assigned to protocol- patient is requesting RF at different pharmacy from prescribed pharmacy  Requested Prescriptions  Pending Prescriptions Disp Refills   Fluticasone-Umeclidin-Vilant (TRELEGY ELLIPTA) 200-62.5-25 MCG/INH AEPB 60 each 3    Sig: Inhale 1 puff into the lungs daily.      Off-Protocol Failed - 02/27/2020  1:49 PM      Failed - Medication not assigned to a protocol, review manually.      Passed - Valid encounter within last 12 months    Recent Outpatient Visits           4 months ago Panlobular emphysema (HCC)   [facility] Storm Frisk, MD   5 months ago Panlobular emphysema [facility])   [facility] Storm Frisk, MD   7 months ago Centrilobular emphysema [facility])   [facility] Storm Frisk, MD   8 months ago Centrilobular emphysema [facility])   [facility] Storm Frisk, MD   10 months ago Centrilobular emphysema [facility])   [facility] Storm Frisk, MD                    Requested Prescriptions  Pending Prescriptions Disp Refills   Fluticasone-Umeclidin-Vilant (TRELEGY ELLIPTA) 200-62.5-25 MCG/INH AEPB 60 each 3    Sig: Inhale 1 puff into the lungs daily.      Off-Protocol Failed - 02/27/2020  1:49 PM      Failed - Medication not assigned to a protocol, review manually.      Passed - Valid encounter within last 12 months    Recent Outpatient Visits           4 months ago Panlobular emphysema [facility])   [facility] Storm Frisk, MD   5 months ago Panlobular emphysema [facility])   [place] Storm Frisk, MD   7 months  ago Centrilobular emphysema [facility])   [facility] Storm Frisk, MD   8 months ago Centrilobular emphysema [facility])    [facility] Storm Frisk, MD   10 months ago Centrilobular emphysema [facility])   [facility] Storm Frisk, MD

## 2020-03-04 DIAGNOSIS — G4733 Obstructive sleep apnea (adult) (pediatric): Secondary | ICD-10-CM | POA: Diagnosis not present

## 2020-03-06 MED FILL — $TRELEGY 200-62.5-25: 200-62.5-25 | 120 days supply | Qty: 240 | Fill #0

## 2020-03-13 DIAGNOSIS — Z23 Encounter for immunization: Secondary | ICD-10-CM | POA: Diagnosis not present

## 2020-03-20 DIAGNOSIS — J939 Pneumothorax, unspecified: Secondary | ICD-10-CM | POA: Diagnosis not present

## 2020-03-20 DIAGNOSIS — R918 Other nonspecific abnormal finding of lung field: Secondary | ICD-10-CM | POA: Diagnosis not present

## 2020-03-20 DIAGNOSIS — J9811 Atelectasis: Secondary | ICD-10-CM | POA: Diagnosis not present

## 2020-03-20 DIAGNOSIS — Z87891 Personal history of nicotine dependence: Secondary | ICD-10-CM | POA: Diagnosis not present

## 2020-03-20 DIAGNOSIS — J432 Centrilobular emphysema: Secondary | ICD-10-CM | POA: Diagnosis not present

## 2020-03-20 DIAGNOSIS — J438 Other emphysema: Secondary | ICD-10-CM | POA: Diagnosis not present

## 2020-03-20 DIAGNOSIS — R0989 Other specified symptoms and signs involving the circulatory and respiratory systems: Secondary | ICD-10-CM | POA: Diagnosis not present

## 2020-03-20 DIAGNOSIS — J95811 Postprocedural pneumothorax: Secondary | ICD-10-CM | POA: Diagnosis not present

## 2020-03-20 DIAGNOSIS — J439 Emphysema, unspecified: Secondary | ICD-10-CM | POA: Diagnosis not present

## 2020-03-20 HISTORY — PX: OTHER SURGICAL HISTORY: SHX169

## 2020-03-20 HISTORY — DX: Pneumothorax, unspecified: J93.9

## 2020-03-21 DIAGNOSIS — J432 Centrilobular emphysema: Secondary | ICD-10-CM | POA: Diagnosis not present

## 2020-03-21 DIAGNOSIS — J939 Pneumothorax, unspecified: Secondary | ICD-10-CM | POA: Diagnosis not present

## 2020-03-21 DIAGNOSIS — J948 Other specified pleural conditions: Secondary | ICD-10-CM | POA: Diagnosis not present

## 2020-03-21 DIAGNOSIS — J9811 Atelectasis: Secondary | ICD-10-CM | POA: Diagnosis not present

## 2020-03-21 DIAGNOSIS — Z4682 Encounter for fitting and adjustment of non-vascular catheter: Secondary | ICD-10-CM | POA: Diagnosis not present

## 2020-03-22 DIAGNOSIS — J939 Pneumothorax, unspecified: Secondary | ICD-10-CM | POA: Diagnosis not present

## 2020-03-22 DIAGNOSIS — J95811 Postprocedural pneumothorax: Secondary | ICD-10-CM | POA: Diagnosis not present

## 2020-03-22 DIAGNOSIS — Z4682 Encounter for fitting and adjustment of non-vascular catheter: Secondary | ICD-10-CM | POA: Diagnosis not present

## 2020-03-22 DIAGNOSIS — J432 Centrilobular emphysema: Secondary | ICD-10-CM | POA: Diagnosis not present

## 2020-03-22 DIAGNOSIS — R918 Other nonspecific abnormal finding of lung field: Secondary | ICD-10-CM | POA: Diagnosis not present

## 2020-03-23 DIAGNOSIS — J939 Pneumothorax, unspecified: Secondary | ICD-10-CM | POA: Diagnosis not present

## 2020-03-23 DIAGNOSIS — R918 Other nonspecific abnormal finding of lung field: Secondary | ICD-10-CM | POA: Diagnosis not present

## 2020-03-23 DIAGNOSIS — J432 Centrilobular emphysema: Secondary | ICD-10-CM | POA: Diagnosis not present

## 2020-03-23 DIAGNOSIS — J948 Other specified pleural conditions: Secondary | ICD-10-CM | POA: Diagnosis not present

## 2020-03-23 DIAGNOSIS — J95811 Postprocedural pneumothorax: Secondary | ICD-10-CM | POA: Diagnosis not present

## 2020-03-24 DIAGNOSIS — Z4682 Encounter for fitting and adjustment of non-vascular catheter: Secondary | ICD-10-CM | POA: Diagnosis not present

## 2020-03-24 DIAGNOSIS — J432 Centrilobular emphysema: Secondary | ICD-10-CM | POA: Diagnosis not present

## 2020-03-24 DIAGNOSIS — J939 Pneumothorax, unspecified: Secondary | ICD-10-CM | POA: Diagnosis not present

## 2020-03-24 DIAGNOSIS — J9811 Atelectasis: Secondary | ICD-10-CM | POA: Diagnosis not present

## 2020-03-25 DIAGNOSIS — J9819 Other pulmonary collapse: Secondary | ICD-10-CM | POA: Diagnosis not present

## 2020-03-25 DIAGNOSIS — J432 Centrilobular emphysema: Secondary | ICD-10-CM | POA: Diagnosis not present

## 2020-03-25 DIAGNOSIS — J939 Pneumothorax, unspecified: Secondary | ICD-10-CM | POA: Diagnosis not present

## 2020-03-26 DIAGNOSIS — J939 Pneumothorax, unspecified: Secondary | ICD-10-CM

## 2020-03-26 DIAGNOSIS — J432 Centrilobular emphysema: Secondary | ICD-10-CM | POA: Diagnosis not present

## 2020-03-26 DIAGNOSIS — Z4682 Encounter for fitting and adjustment of non-vascular catheter: Secondary | ICD-10-CM | POA: Diagnosis not present

## 2020-03-26 DIAGNOSIS — J95811 Postprocedural pneumothorax: Secondary | ICD-10-CM | POA: Diagnosis not present

## 2020-03-26 DIAGNOSIS — J441 Chronic obstructive pulmonary disease with (acute) exacerbation: Secondary | ICD-10-CM

## 2020-03-27 DIAGNOSIS — J939 Pneumothorax, unspecified: Secondary | ICD-10-CM | POA: Diagnosis not present

## 2020-03-27 DIAGNOSIS — J9811 Atelectasis: Secondary | ICD-10-CM | POA: Diagnosis not present

## 2020-03-27 DIAGNOSIS — J432 Centrilobular emphysema: Secondary | ICD-10-CM | POA: Diagnosis not present

## 2020-03-27 DIAGNOSIS — R918 Other nonspecific abnormal finding of lung field: Secondary | ICD-10-CM | POA: Diagnosis not present

## 2020-03-27 DIAGNOSIS — J9819 Other pulmonary collapse: Secondary | ICD-10-CM | POA: Diagnosis not present

## 2020-03-28 ENCOUNTER — Other Ambulatory Visit: Payer: Self-pay

## 2020-03-28 DIAGNOSIS — J432 Centrilobular emphysema: Secondary | ICD-10-CM | POA: Diagnosis not present

## 2020-03-28 DIAGNOSIS — J9811 Atelectasis: Secondary | ICD-10-CM | POA: Diagnosis not present

## 2020-03-28 DIAGNOSIS — J939 Pneumothorax, unspecified: Secondary | ICD-10-CM | POA: Diagnosis not present

## 2020-03-28 DIAGNOSIS — Z4682 Encounter for fitting and adjustment of non-vascular catheter: Secondary | ICD-10-CM | POA: Diagnosis not present

## 2020-03-31 NOTE — Telephone Encounter (Signed)
Richard Fuentes  pls double  Book this pt in for a post hosp visit next week

## 2020-04-01 ENCOUNTER — Ambulatory Visit (INDEPENDENT_AMBULATORY_CARE_PROVIDER_SITE_OTHER): Payer: Medicaid Other | Admitting: General Practice

## 2020-04-01 ENCOUNTER — Encounter: Payer: Self-pay | Admitting: General Practice

## 2020-04-01 ENCOUNTER — Other Ambulatory Visit: Payer: Self-pay

## 2020-04-01 VITALS — BP 116/72 | HR 90 | Ht 73.0 in | Wt 199.2 lb

## 2020-04-01 DIAGNOSIS — Z8249 Family history of ischemic heart disease and other diseases of the circulatory system: Secondary | ICD-10-CM

## 2020-04-01 DIAGNOSIS — E785 Hyperlipidemia, unspecified: Secondary | ICD-10-CM | POA: Diagnosis not present

## 2020-04-01 NOTE — Progress Notes (Signed)
Cardiology Clinic Note   Patient Name: Richard Fuentes Date of Encounter: 04/01/2020  Primary Care Provider:  Claiborne Rigg, NP Primary Cardiologist:  Bryan Lemma, MD  Patient Profile    Richard Fuentes 50 year old male presents the clinic today for follow-up evaluation of his hyperlipidemia.  Past Medical History    Past Medical History:  Diagnosis Date  . Anxiety   . Asthma   . COPD (chronic obstructive pulmonary disease) (HCC)    Severe centrilobular and paraseptal bullous emphysema; Gold stage D; FEV1 and DLCO<30%.;  Currently undergoing transplant evaluation  . Environmental allergies   . Esophagitis   . Hx of migraines   . OCD (obsessive compulsive disorder)   . Psoriasis   . Urticaria    Past Surgical History:  Procedure Laterality Date  . KNEE SURGERY      Allergies  Allergies  Allergen Reactions  . Other Anaphylaxis    Wool  . Shellfish Allergy     History of Present Illness    Richard Fuentes has a past medical history of severe end-stage COPD, pulmonary hypertension, OSA, plaque psoriasis, hyperlipidemia, family history of premature coronary artery disease, tobacco use, exertional chest pain, anxiety, and inguinal hernia.  He was seen and evaluated by Dr. Herbie Baltimore on 11/12/2019.  He had been referred by his PCP and pulmonologist for evaluation of chest tightness, racing heart rate, and shortness of breath.  He was seen on 7/21 by Dr. Delford Field pulmonology and was being treated for chronic asthma since 2013.  He had not seen a pulmonologist until 2021.  He was referred to [facility] transplant clinic for evaluation for Zephyr valve replacement and nonsurgical lung decompression of upper lobes.  As part of the preoperative evaluation he was referred to cardiology for preoperative evaluation.  2/21 his dyspnea worsened with COVID-19 infection which was + 01/27/2019.  His initial consultation with cardiology was 8/21.  A coronary CTA was ordered however he was not able to  perform the test due to his blood pressure traumatic dropping with a dose of metoprolol and the procedure was canceled.  He was discharged home once his blood pressure stabilized.  He reported that his blood pressure remained low throughout the night and he reported feeling very weak.  Echocardiogram was ordered to be done prior to his procedure at [facility].  Recently admitted at [facility] on 03/20/2020 for emphysema and underwent bronchoscopy for endobronchial valve placement to help with the COPD.  He developed pneumothorax and had 2 chest tubes placed on his left side.  Chest tubes were removed on 03/28/2020.  He was able to ambulate while maintaining O2 saturation in the 90s on room air.  He was discharged 03/28/2020 at 9:47 PM.  He presents the clinic today for follow-up evaluation states he is slowly beginning to increase his physical activity.  He reports that it is a challenge to walk any distance.  He is aware that it will take several weeks for him to fully recover.  His chest tube sites are clean dry intact and not draining.  He describes surgical site pain.  I will give him salty 6 diet sheet, have him continue to increase his physical activity as tolerated and follow-up in 6 months.  He is using 2 L nasal cannula with physical activity and is able to be on room air at rest.  Today he denies increased shortness of breath, lower extremity edema, fatigue, palpitations, melena, hematuria, hemoptysis, diaphoresis, weakness, presyncope, syncope, orthopnea, and PND.   Home  Medications    Prior to Admission medications   Medication Sig Start Date End Date Taking? Authorizing Provider  fluticasone (FLONASE) 50 MCG/ACT nasal spray Place 2 sprays into both nostrils daily as needed for allergies or rhinitis. 07/24/19   Bobbitt, Heywood Ilesalph Carter, MD  ipratropium-albuterol (DUONEB) 0.5-2.5 (3) MG/3ML SOLN Inhale into the lungs.    [provider]  levocetirizine (XYZAL) 5 MG tablet Take 1 tablet once daily as  needed for runny nose or itchy eyes. 07/24/19   Bobbitt, Heywood Ilesalph Carter, MD  pantoprazole (PROTONIX) 40 MG tablet Take 1 tablet (40 mg total) by mouth daily. 11/26/19   Storm FriskWright, Patrick E, MD  penicillin v potassium (VEETID) 250 MG/5ML solution Do not take this medication. Bring this medication to your allergy testing appointment. 01/07/20   Hetty BlendAmbs, Anne M, FNP  TRELEGY ELLIPTA 200-62.5-25 MCG/INH AEPB INHALE 1 PUFF INTO THE LUNGS DAILY. 01/11/20   Hoy RegisterNewlin, Enobong, MD  VENTOLIN HFA 108 (90 Base) MCG/ACT inhaler INHALE 2 PUFFS INTO THE LUNGS EVERY 6 (SIX) HOURS AS NEEDED FOR WHEEZING OR SHORTNESS OF BREATH. 12/25/19   Storm FriskWright, Patrick E, MD  Vitamin D, Ergocalciferol, (DRISDOL) 1.25 MG (50000 UNIT) CAPS capsule Take 1 capsule (50,000 Units total) by mouth every 7 (seven) days. 07/31/19   Storm FriskWright, Patrick E, MD  clonazePAM (KLONOPIN) 0.5 MG tablet Take 1 tablet (0.5 mg total) by mouth 2 (two) times daily as needed for anxiety. 08/13/12 03/20/19  Moreno-Coll, Adlih, MD    Family History    Family History  Problem Relation Age of Onset  . Multiple sclerosis Sister   . Psoriasis Maternal Grandmother   . Heart attack Mother        Late in life  . Heart attack Father 7950  . CAD Father   . Heart attack Paternal Grandfather [id number]       Sudden cardiac death  . Sudden Cardiac Death Paternal Grandfather [id number]  . Heart attack Maternal Uncle   . CAD Maternal Uncle   . Heart attack Cousin 32  . Sudden Cardiac Death Cousin 7332  . Allergic rhinitis Neg Hx   . Angioedema Neg Hx   . Asthma Neg Hx   . Eczema Neg Hx   . Immunodeficiency Neg Hx   . Urticaria Neg Hx    He indicated that his mother is deceased. He indicated that his father is deceased. He indicated that the status of his sister is unknown. He indicated that the status of his maternal grandmother is unknown. He indicated that his paternal grandfather is deceased. He indicated that the status of his maternal uncle is unknown. He indicated that his cousin is  deceased. He indicated that the status of his neg hx is unknown.  Social History    Social History   Socioeconomic History  . Marital status: Single    Spouse name: Not on file  . Number of children: Not on file  . Years of education: Not on file  . Highest education level: Not on file  Occupational History  . Not on file  Tobacco Use  . Smoking status: Former Smoker    Packs/day: 1.00    Years: 30.00    Pack years: 30.00    Types: Cigarettes    Quit date: 01/19/2018    Years since quitting: 2.2  . Smokeless tobacco: Never Used  Vaping Use  . Vaping Use: Never used  Substance and Sexual Activity  . Alcohol use: No  . Drug use: No  . Sexual activity:  Not Currently  Other Topics Concern  . Not on file  Social History Narrative  . Not on file   Social Determinants of Health   Financial Resource Strain: Not on file  Food Insecurity: Not on file  Transportation Needs: Not on file  Physical Activity: Not on file  Stress: Not on file  Social Connections: Not on file  Intimate Partner Violence: Not on file     Review of Systems    General:  No chills, fever, night sweats or weight changes.  Cardiovascular:  No chest pain, dyspnea on exertion, edema, orthopnea, palpitations, paroxysmal nocturnal dyspnea. Dermatological: No rash, lesions/masses Respiratory: No cough, dyspnea Urologic: No hematuria, dysuria Abdominal:   No nausea, vomiting, diarrhea, bright red blood per rectum, melena, or hematemesis Neurologic:  No visual changes, wkns, changes in mental status. All other systems reviewed and are otherwise negative except as noted above.  Physical Exam    VS:  BP 116/72 (BP Location: Right Arm, Patient Position: Sitting, Cuff Size: Normal)   Pulse 90   Ht 6\' 1"  (1.854 m)   Wt 199 lb 3.2 oz (90.4 kg)   SpO2 95%   BMI 26.28 kg/m  , BMI Body mass index is 26.28 kg/m. GEN: Well nourished, well developed, in no acute distress. HEENT: normal. Neck: Supple, no JVD,  carotid bruits, or masses. Cardiac: RRR, no murmurs, rubs, or gallops. No clubbing, cyanosis, edema.  Radials/DP/PT 2+ and equal bilaterally.  Respiratory:  Respirations regular and unlabored, clear to auscultation bilaterally. GI: Soft, nontender, nondistended, BS + x 4. MS: no deformity or atrophy. Skin: warm and dry, no rash. Neuro:  Strength and sensation are intact. Psych: Normal affect.  Accessory Clinical Findings    Recent Labs: 08/02/2019: ALT 18; Hemoglobin 14.1; Platelets 272 10/04/2019: BUN 16; Creatinine, Ser 1.07; Potassium 4.6; Sodium 140   Recent Lipid Panel    Component Value Date/Time   CHOL 240 (H) 12/07/2018 1055   TRIG 139 12/07/2018 1055   HDL 50 12/07/2018 1055   CHOLHDL 4.8 12/07/2018 1055   LDLCALC 165 (H) 12/07/2018 1055    ECG personally reviewed by me today- none today.  EKG 02/26/2019  Normal sinus rhythm 82 bpm no ST or T wave deviation.  Coronary calcium score- 4.   Calcium notable proximal LAD only  Chest CT 4/21  No acute cardiopulmonary disease.  Severe centrilobular and paraseptal emphysematous changes upper lung predominant.   Chest CT 03/21/2020 IMPRESSION:   1. Small right hydropneumothorax with apical and basilar components. Two  left chest tubes in place. The coiled portion of the lower pigtail catheter  appears to be surrounded by anterior mediastinal fat. Correlate with tube  function. The superior tube is peripheral and likely in the pleural space.  No evidence of kinking.  2. Status post left upper lobe endobronchial valve placement with left  upper lobe opacitiesand volume loss, likely due to collapse. Heterogeneous  appearance of the left upper lobe opacities, likely related to streak  artifact from arms down position, superimposed infection is not entirely  excluded.     Assessment & Plan   1.  Hyperlipidemia-LDL 165 on 12/07/2018.  Coronary calcium score of 4, low risk.  Previously had a conversation about  potentially starting statin therapy.  He wished to defer during previous appointment.  Red yeast rice, co-Q10, and dietary modifications were discussed. Heart healthy low-sodium high-fiber diet.   Increase physical activity as tolerated Order fasting lipids   Family history of premature coronary artery  disease-denies any recent episodes of chest pain, arm neck or throat discomfort.  Was not able to complete his coronary CTA due to hypotension beta-blocker therapy.  Coronary calcium score 4. Heart healthy low-sodium diet-salty 6 given Increase physical activity as tolerated  Disposition: Follow-up with Dr. Herbie Baltimore in 6 months.  Thomasene Ripple. Ernest Popowski NP-C    04/01/2020, 3:22 PM [facility] [address] Office [phone] Fax [phone]  Notice: This dictation was prepared with Dragon dictation along with smaller phrase technology. Any transcriptional errors that result from this process are unintentional and may not be corrected upon review.  I spent 12 minutes examining this patient, reviewing medications, and using patient centered shared decision making involving her cardiac care.  Prior to her visit I spent greater than 20 minutes reviewing her past medical history,  medications, and prior cardiac tests.

## 2020-04-01 NOTE — Patient Instructions (Signed)
Medication Instructions:  The current medical regimen is effective;  continue present plan and medications as directed. Please refer to the Current Medication list given to you today.  *If you need a refill on your cardiac medications before your next appointment, please call your pharmacy*  Lab Work:   Testing/Procedures:  NONE    NONE  Special Instructions  PLEASE READ AND FOLLOW SALTY 6-ATTACHED-1,800mg  daily  PLEASE INCREASE PHYSICAL ACTIVITY AS TOLERATED  Follow-Up: Your next appointment:  6 month(s) In Person with Bryan Lemma, MD OR IF UNAVAILABLE JESSE CLEAVER, FNP-C   Please call our office 2 months in advance to schedule this appointment   At [facility], you and your health needs are our priority.  As part of our continuing mission to provide you with exceptional heart care, we have created designated Provider Care Teams.  These Care Teams include your primary Cardiologist (physician) and Advanced Practice Providers (APPs -  Physician Assistants and Nurse Practitioners) who all work together to provide you with the care you need, when you need it.            6 SALTY THINGS TO AVOID     1,800MG  DAILY

## 2020-04-04 DIAGNOSIS — J432 Centrilobular emphysema: Secondary | ICD-10-CM | POA: Diagnosis not present

## 2020-04-04 DIAGNOSIS — G4733 Obstructive sleep apnea (adult) (pediatric): Secondary | ICD-10-CM | POA: Diagnosis not present

## 2020-04-04 DIAGNOSIS — J449 Chronic obstructive pulmonary disease, unspecified: Secondary | ICD-10-CM | POA: Diagnosis not present

## 2020-04-05 ENCOUNTER — Emergency Department (HOSPITAL_COMMUNITY)
Admission: EM | Admit: 2020-04-05 | Discharge: 2020-04-05 | Disposition: A | Discharge status: Home / Self Care | Payer: Medicaid Other | Attending: Emergency Medicine | Admitting: Emergency Medicine

## 2020-04-05 ENCOUNTER — Other Ambulatory Visit: Payer: Self-pay

## 2020-04-05 ENCOUNTER — Encounter (HOSPITAL_COMMUNITY): Payer: Self-pay

## 2020-04-05 ENCOUNTER — Emergency Department (HOSPITAL_COMMUNITY): Payer: Medicaid Other

## 2020-04-05 DIAGNOSIS — R059 Cough, unspecified: Secondary | ICD-10-CM | POA: Diagnosis not present

## 2020-04-05 DIAGNOSIS — R000 Tachycardia, unspecified: Secondary | ICD-10-CM | POA: Diagnosis not present

## 2020-04-05 DIAGNOSIS — J449 Chronic obstructive pulmonary disease, unspecified: Secondary | ICD-10-CM | POA: Diagnosis not present

## 2020-04-05 DIAGNOSIS — R457 State of emotional shock and stress, unspecified: Secondary | ICD-10-CM | POA: Diagnosis not present

## 2020-04-05 DIAGNOSIS — R0902 Hypoxemia: Secondary | ICD-10-CM | POA: Diagnosis not present

## 2020-04-05 DIAGNOSIS — G8918 Other acute postprocedural pain: Secondary | ICD-10-CM | POA: Diagnosis not present

## 2020-04-05 DIAGNOSIS — J45909 Unspecified asthma, uncomplicated: Secondary | ICD-10-CM | POA: Diagnosis not present

## 2020-04-05 DIAGNOSIS — R0600 Dyspnea, unspecified: Secondary | ICD-10-CM | POA: Diagnosis present

## 2020-04-05 DIAGNOSIS — R0602 Shortness of breath: Secondary | ICD-10-CM | POA: Diagnosis not present

## 2020-04-05 DIAGNOSIS — Z87891 Personal history of nicotine dependence: Secondary | ICD-10-CM | POA: Insufficient documentation

## 2020-04-05 DIAGNOSIS — R079 Chest pain, unspecified: Secondary | ICD-10-CM | POA: Insufficient documentation

## 2020-04-05 NOTE — ED Provider Notes (Signed)
[facility]-EMERGENCY DEPT Provider Note: Lowella Dell, MD, FACEP  CSN: [id number] MRN: [id number] ARRIVAL: 04/05/20 at 0023 ROOM: WA19/WA19   CHIEF COMPLAINT  Shortness of Breath   HISTORY OF PRESENT ILLNESS  04/05/20 2:42 AM Richard Fuentes is a 50 y.o. male with COPD who recently had an bronchoscopic valve surgery that was complicated with postoperative pneumothorax. This was done at [facility] 03/20/2020 and he was discharged 03/28/2020. He is currently on 2 L by nasal cannula.    He was brought in by EMS this morning after he had an episode of dyspnea and coughing at home.  He used his home albuterol inhaler 6 times and when EMS arrived gave him an albuterol and Atrovent neb treatment.  His symptoms have significantly improved and he feels back to baseline now.  He is having some chest pain which he relates to both the surgery and exacerbation of the postoperative pain due to his coughing attack earlier.  Currently rates his pain about 2 out of 10.   Past Medical History:  Diagnosis Date  . Anxiety   . Asthma   . COPD (chronic obstructive pulmonary disease) (HCC)    Severe centrilobular and paraseptal bullous emphysema; Gold stage D; FEV1 and DLCO<30%.;  Currently undergoing transplant evaluation  . Environmental allergies   . Esophagitis   . Hx of migraines   . OCD (obsessive compulsive disorder)   . Psoriasis   . Urticaria     Past Surgical History:  Procedure Laterality Date  . KNEE SURGERY      Family History  Problem Relation Age of Onset  . Multiple sclerosis Sister   . Psoriasis Maternal Grandmother   . Heart attack Mother        Late in life  . Heart attack Father 61  . CAD Father   . Heart attack Paternal Grandfather 58       Sudden cardiac death  . Sudden Cardiac Death Paternal Grandfather 54  . Heart attack Maternal Uncle   . CAD Maternal Uncle   . Heart attack Cousin 32  . Sudden Cardiac Death Cousin 41  . Allergic rhinitis Neg Hx   .  Angioedema Neg Hx   . Asthma Neg Hx   . Eczema Neg Hx   . Immunodeficiency Neg Hx   . Urticaria Neg Hx     Social History   Tobacco Use  . Smoking status: Former Smoker    Packs/day: 1.00    Years: 30.00    Pack years: 30.00    Types: Cigarettes    Quit date: 01/19/2018    Years since quitting: 2.2  . Smokeless tobacco: Never Used  Vaping Use  . Vaping Use: Never used  Substance Use Topics  . Alcohol use: No  . Drug use: No    Prior to Admission medications   Medication Sig Start Date End Date Taking? Authorizing Provider  benzonatate (TESSALON) 100 MG capsule Take 100 mg by mouth 3 (three) times daily as needed for cough. 03/28/20 04/25/20 Yes [provider]  EPINEPHrine 0.3 mg/0.3 mL IJ SOAJ injection Inject 0.3 mg into the muscle as needed for anaphylaxis.   Yes [provider]  ibuprofen (ADVIL) 200 MG tablet Take 600-800 mg by mouth every 6 (six) hours as needed for mild pain.   Yes [provider]  ipratropium-albuterol (DUONEB) 0.5-2.5 (3) MG/3ML SOLN Inhale 3 mLs into the lungs every 6 (six) hours as needed (sob).   Yes [provider]  levocetirizine (XYZAL) 5 MG tablet Take 1 tablet once daily as needed for runny nose or itchy eyes. 07/24/19  Yes Bobbitt, Heywood Iles, MD  pantoprazole (PROTONIX) 40 MG tablet Take 1 tablet (40 mg total) by mouth daily. 11/26/19  Yes Storm Frisk, MD  TRELEGY ELLIPTA 200-62.5-25 MCG/INH AEPB INHALE 1 PUFF INTO THE LUNGS DAILY. 01/11/20  Yes Newlin, Enobong, MD  VENTOLIN HFA 108 (90 Base) MCG/ACT inhaler INHALE 2 PUFFS INTO THE LUNGS EVERY 6 (SIX) HOURS AS NEEDED FOR WHEEZING OR SHORTNESS OF BREATH. 12/25/19  Yes Storm Frisk, MD  Vitamin D, Ergocalciferol, (DRISDOL) 1.25 MG (50000 UNIT) CAPS capsule Take 1 capsule (50,000 Units total) by mouth every 7 (seven) days. 07/31/19  Yes Storm Frisk, MD  clonazePAM (KLONOPIN) 0.5 MG tablet Take 1 tablet (0.5 mg total) by mouth 2 (two) times daily as  needed for anxiety. 08/13/12 03/20/19  Moreno-Coll, Adlih, MD    Allergies Other and Shellfish allergy   REVIEW OF SYSTEMS  Negative except as noted here or in the History of Present Illness.   PHYSICAL EXAMINATION  Initial Vital Signs Blood pressure 114/78, pulse 86, temperature 98.2 F (36.8 C), temperature source Oral, resp. rate (!) 21, height 6\' 1"  (1.854 m), weight 86.2 kg, SpO2 98 %.  Examination General: Well-developed, well-nourished male in no acute distress; appearance consistent with age of record HENT: normocephalic; atraumatic Eyes: pupils equal, round and reactive to light; extraocular muscles intact Neck: supple Heart: regular rate and rhythm Lungs: clear to auscultation bilaterally Abdomen: soft; nondistended; nontender; bowel sounds present Extremities: No deformity; full range of motion; pulses normal Neurologic: Awake, alert and oriented; motor function intact in all extremities and symmetric; no facial droop Skin: Warm and dry Psychiatric: Normal mood and affect   RESULTS  Summary of this visit's results, reviewed and interpreted by myself:   EKG Interpretation  Date/Time:  Saturday April 05 2020 00:44:22 EDT Ventricular Rate:  94 PR Interval:  148 QRS Duration: 88 QT Interval:  352 QTC Calculation: 441 R Axis:   91 Text Interpretation: Sinus rhythm Borderline right axis deviation No significant change was found Confirmed by Shyna Duignan ([id number]) on 04/05/2020 12:48:40 AM      Laboratory Studies: No results found for this or any previous visit (from the past 24 hour(s)). Imaging Studies: DG Chest 2 View  Result Date: 04/05/2020 CLINICAL DATA:  Shortness of breath EXAM: CHEST - 2 VIEW COMPARISON:  02/24/2019 FINDINGS: Increased left parahilar opacity. Lungs are otherwise clear. No pleural effusion or pneumothorax. IMPRESSION: Increased left parahilar opacity. This may be due to positioning, but noncontrast chest CT recommended to exclude mass.  Electronically Signed   By: 02/26/2019 M.D.   On: 04/05/2020 01:17    ED COURSE and MDM  Nursing notes, initial and subsequent vitals signs, including pulse oximetry, reviewed and interpreted by myself.  Vitals:   04/05/20 0053 04/05/20 0054 04/05/20 0145 04/05/20 0200  BP: 118/70  103/77 114/78  Pulse: 98  95 86  Resp: (!) 24  17 (!) 21  Temp: 98.2 F (36.8 C)     TempSrc: Oral     SpO2:   98% 98%  Weight:  86.2 kg    Height:  6\' 1"  (1.854 m)     Medications - No data to display  Patient has been observed for about 2-1/2 hours in the ED with no new dyspnea.  I believe he is safe for discharge home at this time.  PROCEDURES  Procedures  ED DIAGNOSES     ICD-10-CM   1. Shortness of breath  R06.02        Damir Leung, Jonny Ruiz, MD 04/05/20 0300

## 2020-04-05 NOTE — ED Triage Notes (Signed)
Pt from home with Shortness of breath, has hx of stage 4 emphysema, wears oxygen prn 2L at home.  Initially was on 6L when ems picked pt up, pt states he used rescue inhaler 6 times.  EMS gave albuterol and Atrovent.  Pt arrives on 1L 02 at 99%, removed 02, pt 99% on room air.  Pt reports he had endobronchial valve lung reduction surgery and then a pneumo afterwards and has been very short of breath since with minimal exertion.  Pt a&ox4.

## 2020-04-06 ENCOUNTER — Other Ambulatory Visit: Payer: Self-pay

## 2020-04-06 MED FILL — Benzonatate Cap 100 MG: ORAL | 14 days supply | Qty: 42 | Fill #0 | Status: CN

## 2020-04-06 MED FILL — Ergocalciferol Cap 1.25 MG (50000 Unit): ORAL | 56 days supply | Qty: 8 | Fill #0 | Status: CN

## 2020-04-07 ENCOUNTER — Other Ambulatory Visit: Payer: Self-pay

## 2020-04-07 MED FILL — Ergocalciferol Cap 1.25 MG (50000 Unit): ORAL | 28 days supply | Qty: 4 | Fill #0 | Status: CN

## 2020-04-07 MED FILL — Benzonatate Cap 100 MG: ORAL | 14 days supply | Qty: 42 | Fill #0 | Status: CN

## 2020-04-08 ENCOUNTER — Other Ambulatory Visit: Payer: Self-pay

## 2020-04-08 ENCOUNTER — Ambulatory Visit: Payer: Medicaid Other | Attending: Critical Care Medicine | Admitting: Critical Care Medicine

## 2020-04-08 ENCOUNTER — Encounter: Payer: Self-pay | Admitting: Critical Care Medicine

## 2020-04-08 VITALS — BP 133/78 | HR 123 | Resp 24 | Ht 72.0 in | Wt 198.2 lb

## 2020-04-08 DIAGNOSIS — Z7952 Long term (current) use of systemic steroids: Secondary | ICD-10-CM | POA: Insufficient documentation

## 2020-04-08 DIAGNOSIS — R000 Tachycardia, unspecified: Secondary | ICD-10-CM | POA: Diagnosis not present

## 2020-04-08 DIAGNOSIS — Z942 Lung transplant status: Secondary | ICD-10-CM | POA: Insufficient documentation

## 2020-04-08 DIAGNOSIS — Z79899 Other long term (current) drug therapy: Secondary | ICD-10-CM | POA: Insufficient documentation

## 2020-04-08 DIAGNOSIS — Z8616 Personal history of COVID-19: Secondary | ICD-10-CM | POA: Diagnosis not present

## 2020-04-08 DIAGNOSIS — F411 Generalized anxiety disorder: Secondary | ICD-10-CM | POA: Diagnosis not present

## 2020-04-08 DIAGNOSIS — J441 Chronic obstructive pulmonary disease with (acute) exacerbation: Secondary | ICD-10-CM | POA: Diagnosis not present

## 2020-04-08 DIAGNOSIS — R0902 Hypoxemia: Secondary | ICD-10-CM | POA: Insufficient documentation

## 2020-04-08 DIAGNOSIS — J431 Panlobular emphysema: Secondary | ICD-10-CM

## 2020-04-08 DIAGNOSIS — Z87891 Personal history of nicotine dependence: Secondary | ICD-10-CM | POA: Insufficient documentation

## 2020-04-08 DIAGNOSIS — Z8249 Family history of ischemic heart disease and other diseases of the circulatory system: Secondary | ICD-10-CM | POA: Insufficient documentation

## 2020-04-08 MED ORDER — METHYLPREDNISOLONE SODIUM SUCC 125 MG IJ SOLR
125.0000 mg | Freq: Once | INTRAMUSCULAR | Status: DC
Start: 2020-04-08 — End: 2020-04-08

## 2020-04-08 MED ORDER — DOXYCYCLINE HYCLATE 100 MG PO TABS
100.0000 mg | ORAL_TABLET | Freq: Two times a day (BID) | ORAL | 0 refills | Status: DC
  Filled 2020-04-08: qty 14, 7d supply, fill #0

## 2020-04-08 NOTE — Progress Notes (Signed)
Pain in lungs due to recent surgery.

## 2020-04-08 NOTE — Assessment & Plan Note (Signed)
COPD with primary emphysema he was originally referred in July 2021 to the [facility] transplant program who declined him for transplantation he then was referred to Dr. Carmie Kanner at [facility] for pulmonary bronchial valves which were performed and placed in the left upper lobe causing collapse of the left upper lobe.  The left diaphragm has elevated for this needs not had good expansion of the remaining portion of the left lower lobe based on the left upper lobe collapse  The patient now has significant bronchorrhea excess cough and tachycardia with hypoxic spells with acute anxiety attacks  Plan is to repulsed prednisone and to take doxycycline for 7 days as well he will increase his nebulizer treatments and use Tessalon Perles as needed for cough suppression  We will communicate with his surgeon to see if there is anything else that can be done for his postoperative course

## 2020-04-08 NOTE — Patient Instructions (Addendum)
Use your nebulizer 4 times daily  Take the prednisone for a day until the bottle is empty   Use the Tessalon Perles to help suppress the cough  Take doxycycline twice a day for 7 days  Stay at home and take it very easy and return to see Dr. Delford Field in 2 weeks  Keep your appointment with [facility] in early May as scheduled  If you get acutely worse and cannot break any cycle of shortness of breath go back to the emergency room

## 2020-04-08 NOTE — Progress Notes (Signed)
Subjective:    Patient ID: Richard Fuentes, male    DOB: June 08, 1970, 50 y.o.   MRN: [id number] History of Present Illness: First visit March 20, 2019 50 y.o.M with Copd/ asthma with freq ED visits ref from PCP for pulmonary evaluation The patient notes since 2013 the onset of asthma type symptoms of the progressively gotten worse he is never seen a pulmonologist.  At that time he also had significant periodontal disease and a dental infection that was not addressed.  Patient has history of generalized anxiety disorder and obsessive-compulsive disorder and this prevents him from taking medications at times.  He is very fearful of any particular treatments and tries to minimize medications.  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  He was in the emergency room in February given a pulse dose of prednisone but did not seem to help.  He has been followed at [facility] however he is on no medications does not return to [place] since prior to Covid onset.  He did himself develop Covid viral infection in January and since that time has had chronic fatigue.  The patient is on Symbicort 2 inhalations twice daily through patient assistance and states despite this is not under optimal control.  He states he has difficulty with laying flat also difficulty with eating meals without becoming full early The patient has not had pulmonary functions as of now  05/01/2019 Patient seen in return follow-up for severe COPD with bullous emphysema.  CT scan of chest has been obtained and shows severe bullous emphysema in the apices but also centrilobular emphysema diffusely.  The patient states the Trelegy inhaler has been helpful however the patient continues to have difficulty with dyspnea on exertion.  He wakens at night with severe shortness of breath as well.  He no longer  is smoking tobacco products.  Anxiety is a major issue for this patient and he states the antianxiety medication has been of some benefit at this time.  The patient's pulmonary functions have been obtained and show Gold stage D COPD with FEV1 less than 30% predicted diffusion capacity less than 30% predicted and severe residual volume air trapping  06/05/2019 This is a video visit and the patient returns in follow-up and does have an upcoming appointment at the [facility] transplant center and has received Medicaid for disability  Patient still has severe dyspnea with minimal exertion.  He has a sleep study pending for July.  He is now on multivitamins.  He complains of worsening plaque psoriasis is requesting dermatology referral.  He has had psoriasis for 14 years but it appears to be worsening more recently.  He has a nonproductive cough has difficulty raising secretions at this time this appears to have worsened as well.  He maintains Trelegy daily and uses his nebulizer at least twice daily and does have a flutter valve in place  07/31/2019 This is a face-to-face visit follow-up for severe COPD end-stage Gold stage D centrilobular emphysema.  The patient also has severe plaque psoriasis. Note this patient went to a dermatologist for the plaque psoriasis and it was felt that the patient would only benefit from topical therapy that he had tried these before and did not see improvement.  He states his psoriasis is progressing.  Note the patient is already been screened to the transplant clinic for tuberculosis  hepatitis AB and C all of which were negative  Patient also is in the transplant clinic being evaluated for potential transplant they wish to hold on this for now.  He is also being evaluated for potential lung volume reduction  Patient's blood gases in the clinic are unchanged recent chest x-ray is unchanged CT scan is pending  Patient states his dyspnea is so severe he cannot complete a shower  and is requesting a shower stool he also would like an alpha-1 antitrypsin level checked I thought we had checked this previously but no evidence of this is seen so an alpha-1 level needs to be checked   Patient states he is also requesting a cardiology referral as [facility] is requesting this as part of his pulmonary transplant eval  09/13/2019 Video visit The patient wishes to follow-up to discuss several issues #1 cost of covering pulmonary rehab #2 concerns over psoriasis wishing a second opinion #3 needing CPAP machine now that he has Medicaid #4 cost of placing pulmonary valve treatments and the lack of Medicaid coverage  Patient went through mediation and it was determined by Medicaid his pulmonary valves would not be covered therefore he is not having this done.  The patient needs financial assistance for pulmonary rehab or attempting to identify funds for this.  Patient now has Medicaid is wishing a CPAP machine.  Patient complains of lack of progress with his dermatologist which is a second opinion.  There is been no real change in the patient's level of dyspnea.  His transplant of his lung needs to be put off for a period of time.  10/24/2019 Patient seen return follow-up still has severe COPD symptoms with shortness of breath with minimal exertion.  He has been to [facility] for transplantation evaluation.  He is a state he is not far enough along to receive this yet.  The patient now has insurance access and is wishing to proceed with CPAP machine because of he has sleep apnea due to the elevated AHI  The patient was not able to achieve the bronchial valves at [facility] because his insurance would not cover  04/08/2020 This is a primary care follow-up visit for this patient with sleep apnea advanced emphysematous COPD status post recent bronchial valve placements in the left upper lobe with 4 different valves placed in the left upper lobe.  Patient had complete collapse of the left upper lobe he did  receive a complication of a pneumothorax in the left lung which was resolved with 2 chest tubes.  Below is the discharge summary at the [facility] where this procedure was performed The hospitalizations occurred in March last month. Richard Fuentes is a 50 y.o. male PMH COPD, tobacco use (68 pack years, quit 2021), OCD, HLD, GERD presenting for routine postoperative monitoring s/p endobronchial valve placement 3/17.  Increasing DOE, cough since late 2020. Worsening symptoms since COVID infection 01/2019. COPD exacerbations 3-4x/year for which he is usually treated with prednisone. Not on home oxygen. Transplant workup ongoing.  Last PFTs 02/2020 w/ FEV1/FVC 39% predicted, DLCO 32% predicted. CT scan 08/2019 w/ stable severe upper lobe emphysema. Echo 02/2020 WNL. Patient underwent lung volume reduction with placement of 4 valves in left upper lobe today. Postprocedure patient had acute chest discomfort and found to have moderate left pneumothorax, with placement of pigtail catheter in the left and lower mid axillary line with transient air leak. Follow-up chest x-ray with worsening pneumothorax, chest tube was placed to -20 cm water suction. Repeat chest  x-ray in 1 hour showed progressive pneumothorax which prompted placement of additional chest tube superiorly along left midaxillary line with immediate gush of air was noted with rapid Ronald reexpansion. Repeat chest x-ray with improvement of left pneumothorax. Currently both chest tubes are to waterseal."  HOSPITAL COURSE BY PROBLEM   # COPD # s/p endobronchial valve placement 3/17 # post-operative L pneumothorax s/p chest tube placementX2 # Hypoxic respiratory failure after endobronchial valves Mr. Mckenzie had a postoperative pneumothorax requring chest tube x2. Lower chest tube removed 3/20 as imaging revealed to mostly be in mediastinal fat - Pneumothoraces resolved and chest tubes removed prior to discharge - Interventional pulm followed -  pain regimen: - Acetaminophen  PO TID - Oxycodone 5-10mg  q6h PRN - Bowel regimen with BID miralax and senokot - continuedwith trelegyEllipta inhalation daily - continued with DuoNebs as needed. - hypertonic saline and mucinex for airway clearance - PT assessment for mobility and O2 requirement revealed need for 2 LPM via [place] at discharge. Home oxygen set up for discharge  #elevated Cr Baseline 1.0, elevated to 1.6. PO intake has been marginal so likely prerenal in origin. - 1L LR - Resolved and creatinine back to baseline after 1L LR  #OSA on CPAP VBG reassuring. We will hold CPAP in the setting of pneumothorax. - O2 via [place]  # OCD ?Generalized anxiety disorder Untreated, will need psych evaluation before transplant - CTM, psych mgmt per outpatient transplant team - PRN Atarax for anxiety.  # GERD - continue pantoprazole  PO QD - TUMS BID with lunch and dinner  # Allergic rhinitis - Loratadine  PO QD  #Psoriatic skin lesions Not on topical therapy.Usually uses calcipotriene ointment at home but left it when coming in and not available on hospital formulary. - Triamcinolone  Procedure(s): BRONCHOSCOPY, RIGID OR FLEXIBLE, INCL FLUOROSCOPIC GUIDANCE, WHEN PERFORMED; WITH BALLOON OCCLUSION, WHEN PERFORMED, ASSESSMENT AIR LEAK, AIRWAY SIZING, AND INSERTION OF BRONCHIAL VALVE, INITIAL LOBE  DISCHARGE PHYSICAL EXAM   BP 113/68 (BP Location: Left upper arm, Patient Position: Sitting)  Pulse 85  Temp 36.9 C (98.5 F) (Oral)  Resp 18  Ht 185.4 cm (6' 0.99")  Wt 90 kg (198 lb 6.6 oz)  SpO2 97%  BMI 26.18 kg/m   Post discharge patient has seen cardiology and they recommended healthy diet alone for the patient's elevated cholesterol and no statin therapy indicated.  His calcium coronary score was low. Saw cardiology 04/01/20:  Hyperlipidemia-LDL 165 on 12/07/2018.  Coronary calcium score of 4, low risk.  Previously had a conversation about potentially  starting statin therapy.  He wished to defer during previous appointment.  Red yeast rice, co-Q10, and dietary modifications were discussed. Heart healthy low-sodium high-fiber diet.   Increase physical activity as tolerated Order fasting lipids   Family history of premature coronary artery disease-denies any recent episodes of chest pain, arm neck or throat discomfort.  Was not able to complete his coronary CTA due to hypotension beta-blocker therapy.  Coronary calcium score 4. Heart healthy low-sodium diet-salty 6 given Increase physical activity as tolerated  Patient did go to the emergency room on April 2 because of tachycardia and hypoxemia he was were observed and then let go  The patient has severe obsessive-compulsive disorder obsesses over every small thing and gets quite anxious at times I suspect is hyperventilating becomes very tachycardic with this.  On arrival pulse was 123 blood pressure 133/78 saturation was 98% on 2 L  No patient was discharged home on 2 L.  He does have paroxysms of cough minimally productive.  The bronchoscope  note showed at the time of pulmonary valve placement there was minimal secretions in the left upper lobe  Review shortness of breath assessment  Shortness of Breath This is a chronic problem. The current episode started more than 1 year ago. The problem occurs constantly. The problem has been rapidly worsening. Associated symptoms include chest pain, headaches, orthopnea, PND, a rash, sputum production and wheezing. Pertinent negatives include no ear pain, hemoptysis or rhinorrhea. The symptoms are aggravated by any activity, exercise, weather changes, fumes, odors, pollens, emotional upset and eating. Risk factors include smoking. He has tried beta agonist inhalers, oral steroids and steroid inhalers for the symptoms. His past medical history is significant for allergies, asthma, COPD and pneumonia. There is no history of CAD, DVT, a heart failure or  PE.   Past Medical History:  Diagnosis Date  . Anxiety   . Asthma   . COPD (chronic obstructive pulmonary disease) (HCC)    Severe centrilobular and paraseptal bullous emphysema; Gold stage D; FEV1 and DLCO<30%.;  Currently undergoing transplant evaluation  . Environmental allergies   . Esophagitis   . Hx of migraines   . OCD (obsessive compulsive disorder)   . Psoriasis   . Urticaria      Family History  Problem Relation Age of Onset  . Multiple sclerosis Sister   . Psoriasis Maternal Grandmother   . Heart attack Mother        Late in life  . Heart attack Father 75  . CAD Father   . Heart attack Paternal Grandfather 2       Sudden cardiac death  . Sudden Cardiac Death Paternal Grandfather 93  . Heart attack Maternal Uncle   . CAD Maternal Uncle   . Heart attack Cousin 32  . Sudden Cardiac Death Cousin 51  . Allergic rhinitis Neg Hx   . Angioedema Neg Hx   . Asthma Neg Hx   . Eczema Neg Hx   . Immunodeficiency Neg Hx   . Urticaria Neg Hx      Social History   Socioeconomic History  . Marital status: Single    Spouse name: Not on file  . Number of children: Not on file  . Years of education: Not on file  . Highest education level: Not on file  Occupational History  . Not on file  Tobacco Use  . Smoking status: Former Smoker    Packs/day: 1.00    Years: 30.00    Pack years: 30.00    Types: Cigarettes    Quit date: 01/19/2018    Years since quitting: 2.2  . Smokeless tobacco: Never Used  Vaping Use  . Vaping Use: Never used  Substance and Sexual Activity  . Alcohol use: No  . Drug use: No  . Sexual activity: Not Currently  Other Topics Concern  . Not on file  Social History Narrative  . Not on file   Social Determinants of Health   Financial Resource Strain: Not on file  Food Insecurity: Not on file  Transportation Needs: Not on file  Physical Activity: Not on file  Stress: Not on file  Social Connections: Not on file  Intimate Partner  Violence: Not on file     Allergies  Allergen Reactions  . Other Anaphylaxis    Wool  . Shellfish Allergy      Outpatient Medications Prior to Visit  Medication Sig Dispense Refill  . benzonatate (TESSALON)  100 MG capsule Take 100 mg by mouth 3 (three) times daily as needed for cough.    . EPINEPHrine 0.3 mg/0.3 mL IJ SOAJ injection Inject 0.3 mg into the muscle as needed for anaphylaxis.    Marland Kitchen ibuprofen (ADVIL) 200 MG tablet Take 600-800 mg by mouth every 6 (six) hours as needed for mild pain.    Marland Kitchen ipratropium-albuterol (DUONEB) 0.5-2.5 (3) MG/3ML SOLN Inhale 3 mLs into the lungs every 6 (six) hours as needed (sob).    Marland Kitchen levocetirizine (XYZAL) 5 MG tablet Take 1 tablet once daily as needed for runny nose or itchy eyes. 34 tablet 5  . pantoprazole (PROTONIX) 40 MG tablet Take 1 tablet (40 mg total) by mouth daily. 30 tablet 3  . predniSONE (DELTASONE) 10 MG tablet Take by mouth. 4 for 2 then down by one every 2 days til gone    . TRELEGY ELLIPTA 200-62.5-25 MCG/INH AEPB INHALE 1 PUFF INTO THE LUNGS DAILY. 60 each 3  . VENTOLIN HFA 108 (90 Base) MCG/ACT inhaler INHALE 2 PUFFS INTO THE LUNGS EVERY 6 (SIX) HOURS AS NEEDED FOR WHEEZING OR SHORTNESS OF BREATH. 18 g 1  . Vitamin D, Ergocalciferol, (DRISDOL) 1.25 MG (50000 UNIT) CAPS capsule Take 1 capsule (50,000 Units total) by mouth every 7 (seven) days. 12 capsule 1   No facility-administered medications prior to visit.      Review of Systems  HENT: Negative for ear pain and rhinorrhea.   Respiratory: Positive for sputum production, shortness of breath and wheezing. Negative for hemoptysis.   Cardiovascular: Positive for chest pain, orthopnea and PND.  Skin: Positive for rash.  Neurological: Positive for headaches.       Objective:   Physical Exam Vitals:   04/08/20 1445  BP: 133/78  Pulse: (!) 123  Resp: (!) 24  SpO2: 95%  Weight: 198 lb 3.2 oz (89.9 kg)  Height: 6' (1.829 m)   Gen: well-nourished, in no distress, anxious  affect, push of speech often answering my questions with medical interpretations of his symptoms  ENT: No lesions,  mouth clear,  oropharynx clear, no postnasal drip  Neck: No JVD, no TMG, no carotid bruits  Lungs: No use of accessory muscles, no dullness to percussion, distant breath sounds hyperresonance to percussion,    Cardiovascular: RRR, heart sounds normal, no murmur or gallops, no peripheral edema  Abdomen: soft and NT, no HSM,  BS normal  Musculoskeletal: No deformities, no cyanosis or clubbing  Neuro: alert, non focal  Skin: Warm, no lesions or rashes   CXR reviewed 02/2019:  Hyperinflation and changes c/w centrilobular emphysema  Sleep study 08/2019 IMPRESSIONS - Moderate obstructive sleep apnea occurred during this study (AHI = 21.7/h). - No significant central sleep apnea occurred during this study (CAI = 0.0/h). - Oxygen desaturation was noted during this study (Min O2 = 82%). Mean sat 93%. - Patient snored.  DIAGNOSIS - Obstructive Sleep Apnea (G47.33)  RECOMMENDATIONS - Suggest CPAP titration sleep study or autopap. Otherr options would be based on clinical judgment. - Be careful with alcohol, sedatives and other CNS depressants that may worsen sleep apnea and disrupt normal sleep architecture. - Sleep hygiene should be reviewed to assess factors that may improve sleep quality. - Weight management and regular exercise should be initiated or continued.  CT Chest: 4/20 FINDINGS: Cardiovascular: Heart is normal in size. Trace anterior pericardial fluid.  No evidence of thoracic aortic aneurysm.  Mediastinum/Nodes: No suspicious mediastinal lymphadenopathy.  Visualized thyroid is unremarkable.  Lungs/Pleura: Moderate  to severe centrilobular and paraseptal emphysematous changes, upper lung predominant.  No focal consolidation.  No suspicious pulmonary nodules.  No pleural effusion or pneumothorax.  Upper Abdomen: Visualized upper abdomen  is grossly unremarkable.  Musculoskeletal: Visualized osseous structures are within normal limits.  IMPRESSION: No evidence of acute cardiopulmonary disease.  Emphysema (ICD10-J43.9).  PFTs 4/6:  Ref Range & Units 3 wk ago  FVC-Pre L 2.94   FVC-%Pred-Pre % 52   FVC-Post L 3.02   FVC-%Pred-Post % 54   FVC-%Change-Post % 2   FEV1-Pre L 1.12   FEV1-%Pred-Pre % 25   FEV1-Post L 1.24   FEV1-%Pred-Post % 28   FEV1-%Change-Post % 10   FEV6-Pre L 2.40   FEV6-%Pred-Pre % 44   FEV6-Post L 2.75   FEV6-%Pred-Post % 51   FEV6-%Change-Post % 14   Pre FEV1/FVC ratio % 38   FEV1FVC-%Pred-Pre % 49   Post FEV1/FVC ratio % 41   FEV1FVC-%Change-Post % 7   Pre FEV6/FVC Ratio % 82   FEV6FVC-%Pred-Pre % 84   Post FEV6/FVC ratio % 91   FEV6FVC-%Pred-Post % 94   FEV6FVC-%Change-Post % 11   FEF 25-75 Pre L/sec 0.36   FEF2575-%Pred-Pre % 9   FEF 25-75 Post L/sec 0.53   FEF2575-%Pred-Post % 14   FEF2575-%Change-Post % 49   RV L 5.62   RV % pred % 255   TLC L 8.43   TLC % pred % 111   DLCO unc ml/min/mmHg 10.64   DLCO unc % pred % 33   DL/VA ml/min/mmHg/L 1.49   DL/VA % pred % 61    Below overall the imaging studies obtained during his [facility] hospitalization 03/2020 imaging:  IMAGING AND DIAGNOSTIC PROCEDURES   X-ray chest single view portable  Result Date: 03/21/2020 XR CHEST SINGLE VIEW PORTABLE INDICATION: Tube Position, Lung Aeration, J93.9 Pneumothorax, unspecified COMPARISON: Today at 5:59 AM FINDINGS/IMPRESSION: The 2 left-sided chest tubes are unchanged in position. Endobronchial valves in the left hilum grossly unchanged. A small left basilar pneumothorax is likely still present. No significant apical component. No other change.. Electronically Signed by: Loyal Gambler, MD, [facility] Electronically Signed on: 03/21/2020 1:11 PM  X-ray chest single view portable  Result Date: 03/21/2020 XR CHEST SINGLE VIEW PORTABLE INDICATION: Lung Aeration, J43.2 Centrilobular emphysema  (CMS-HCC) COMPARISON: 03/20/2020 FINDINGS: Stable cardiac and mediastinal contours. Redemonstrated 2 left pleural drainage catheters. Left basilar pneumothorax. No pleural effusion. Asymmetric heterogeneous left pulmonary opacities centered around the left upper lobe endobronchial valves. IMPRESSION: 1. Persistent left basilar pneumothorax, no left apical pneumothorax. 2. Heterogeneous left lung opacities, centered around the endobronchial valve, likely atelectasis. Electronically Reviewed by: Cecile Hearing, MD, [facility] Electronically Reviewed on: 03/21/2020 10:09 AM I have reviewed the images and concur with the above findings. Electronically Signed by: Lennice Sites, MD, [facility] Electronically Signed on: 03/21/2020 10:19 AM  X-ray chest single view portable  Result Date: 03/21/2020 AP VIEW OF THE CHEST CLINICAL INFORMATION: Lung Aeration, J95.811 Postprocedural pneumothorax. COMPARISON: Prior exam March 10, 2020 FINDINGS/IMPRESSION: 1. Stable support apparatus. 2. Mild interval decreased size of left pneumothorax. 3. Stable cardiac and mediastinal silhouette. 4. Unchanged appearance of the right lung. Electronically Signed by: Imagene Sheller, MD, [facility] Electronically Signed on: 03/21/2020 6:57 AM  X-ray chest single view portable  Result Date: 03/21/2020 AP VIEW OF THE CHEST CLINICAL INFORMATION: Tube Position, Lung Aeration, J95.811 Postprocedural pneumothorax. COMPARISON: Prior March 20, 2020 FINDINGS/IMPRESSION: 1. Interval placement of additional left pigtail catheter. Prior left basilar pigtail catheter remains in place.  2. Mild decreased size of large left pneumothorax. 3. Cardiomediastinal contours are stable. 4. Right lung is stable in appearance. 5. No pleural effusion. Electronically Signed by: Imagene Sheller, MD, [facility] Electronically Signed on: 03/21/2020 5:58 AM  X-ray chest single view portable  Result Date: 03/21/2020 AP VIEW OF THE CHEST CLINICAL INFORMATION:  Lung Aeration, J95.811 Postprocedural pneumothorax. COMPARISON: Prior exam March 20, 2020 FINDINGS/IMPRESSION: 1. Left basilar pigtail catheter in stable position. There is persistent large left pneumothorax, which appears mildly in size from prior exam. 2. The cardiac and mediastinal contours are stable. Left lung bronchial valves similar to prior. 3. Right lung is stable appearance with mild hyperinflation. 4. No pleural effusion. Electronically Signed by: Imagene Sheller, MD, [facility] Electronically Signed on: 03/21/2020 5:26 AM  X-ray chest single view portable  Result Date: 03/20/2020 Procedure: Single frontal view of the chest. Indication: Tube Position, Lung Aeration, J95.811 Postprocedural pneumothorax Comparison: 03/20/2020. Impression: 1. Interval placement of a left pigtail drainage catheter. 2. Probable slight increase in size of a large left pneumothorax. 3. New bronchial valves remaining in place. 4. Stable appearance to the right lung which remains hyperinflated. Stable cardiac and mediastinal contours allowing for differences in patient rotation. Electronically Signed by: Dierdre Forth, MD, [facility] Electronically Signed on: 03/20/2020 5:39 PM  X-ray chest single view portable  Result Date: 03/20/2020 Procedure: Single frontal view of the chest. Indication: Lung Aeration, J43.2 Centrilobular emphysema (CMS-HCC) Comparison: 02/11/2020. Impression: 1. Stable cardiac and mediastinal contours. 2. Bronchial valves now seen overlying the left hilum. 3. New partial atelectasis of the left upper lobe, with moderate sized left pneumothorax. 4. Stable, normal appearance to the right lung and pleural space. Pneumothorax discussed with Dr. Carmie Kanner at 4:01 PM on 03/20/2020. Electronically Signed by: Dierdre Forth, MD, [facility] Electronically Signed on: 03/20/2020 5:17 PM  CT chest without contrast with 3D MIPS Protocol  Result Date: 03/22/2020 Procedure: CT CHEST WITHOUT CONTRAST WITH  3D MIPS PROTOCOL Indication: Pneumothorax, J95.811 Postprocedural pneumothorax. Comparison Exams: CT chest without contrast August 07, 2019. Protocol: Chest CT WO Protocol. Contiguous 0.625 mm axial images with 5 mm axial, 3 mm coronal, and 3 mm sagittal reconstructions were obtained from the neck base through the upper abdomen without IV contrast material. In addition, 3-D maximal intensity projection (MIP) reconstructions were performed to potentially increase the sensitivity for the detection of pulmonary nodules. Findings: FINDINGS: LUNGS/AIRWAYS: The central airways are patent. Severe upper lobe predominant centrilobular emphysema. Left upper lobe opacities with associated volume loss. Heterogeneous appearance of the left upper lobe opacities. High density material in multiple left upper lobe bronchi consistent with history of endobronchial valve placement. PLEURA: Small right hydropneumothorax with an apical and basilar component. Two left chest tubes in place. The coiled portion of the lower pigtail catheter appears to be surrounded by anterior mediastinal fat, series 3 image 340. The superior tube is more peripheral and likely in the pleural space. MEDIASTINUM: No mediastinal or hilar adenopathy. Normal esophagus. HEART/VESSELS: The heart is stable in size. No pericardial effusion. Mild LAD coronary artery calcifications. The aorta is normal in size and contour without significant atherosclerotic calcifications. The pulmonary artery is normal in size. VISIBLE ABDOMEN: Evaluation is limited due to lack of intravenous contrast. Within this limitation, visualized portions appear unremarkable. BONES/SOFT TISSUES/OTHER: No aggressive osseous lesions. Normal thyroid. No axillary adenopathy. IMPRESSION: 1. Small right hydropneumothorax with apical and basilar components. Two left chest tubes in place. The coiled portion of the lower pigtail catheter appears to be surrounded  by anterior mediastinal fat. Correlate  with tube function. The superior tube is peripheral and likely in the pleural space. No evidence of kinking. 2. Status post left upper lobe endobronchial valve placement with left upper lobe opacities and volume loss, likely due to collapse. Heterogeneous appearance of the left upper lobe opacities, likely related to streak artifact from arms down position, superimposed infection is not entirely excluded. The findings were discussed with Dr. Desma MaximAntonia via telephone on 03/21/2020 6:47 PM by Vernia BuffImarhia Enogieru, MD, [facility]. The preliminary report (critical or emergent communication) was reviewed prior to this dictation and there are no substantial differences between the preliminary results and the impressions in this final report. Electronically Reviewed by: Vernia BuffImarhia Enogieru, MD, [facility] Electronically Reviewed on: 03/22/2020 10:41 AM I have reviewed the images and concur with the above findings. Electronically Signed by: Allegra LaiLeah Strickland, MD, [facility] Electronically Signed on: 03/22/2020 12:52 PM      Assessment & Plan:  I personally reviewed all [facility] and [facility] as well as any outside material available during this office visit and agree with the  radiology impressions.   COPD with emphysema (HCC)GOLD D  COPD with primary emphysema he was originally referred in July 2021 to the [facility] transplant program who declined him for transplantation he [facility] which were performed and placed in the left upper lobe causing collapse of the left upper lobe.  The left diaphragm has elevated for this needs not had good expansion of the remaining portion of the left lower lobe based on the left upper lobe collapse  The patient now has significant bronchorrhea excess cough and tachycardia with hypoxic spells with acute anxiety attacks  Plan is to repulsed prednisone and to take doxycycline for 7 days as well he will increase  his nebulizer treatments and use Tessalon Perles as needed for cough suppression  We will communicate with his surgeon to see if there is anything else that can be done for his postoperative course   Cletis AthensJon was seen today for hospitalization follow-up.  Diagnoses and all orders for this visit:  COPD exacerbation (HCC) -     Discontinue: methylPREDNISolone sodium succinate (SOLU-MEDROL) 125 mg/2 mL injection 125 mg  Panlobular emphysema (HCC)  Other orders -     doxycycline (VIBRA-TABS) 100 MG tablet; Take 1 tablet (100 mg total) by mouth 2 (two) times daily.

## 2020-04-09 ENCOUNTER — Other Ambulatory Visit: Payer: Self-pay

## 2020-04-10 ENCOUNTER — Other Ambulatory Visit: Payer: Self-pay

## 2020-04-10 MED FILL — Ergocalciferol Cap 1.25 MG (50000 Unit): ORAL | 56 days supply | Qty: 8 | Fill #0 | Status: AC

## 2020-04-11 ENCOUNTER — Telehealth (HOSPITAL_COMMUNITY): Payer: Self-pay

## 2020-04-11 NOTE — Telephone Encounter (Signed)
Called and spoke with pt in regards to [place], pt stated that he recv'ed a letter from the attorney general. That stated "pulmonary rehab is now covered thru Medicaid, starting Jan. 1 2022".  Was not able to verify information on [place] track website.

## 2020-04-11 NOTE — Telephone Encounter (Signed)
Called patient to see if he is interested in the Pulmonary Rehab Program. Patient expressed interest. Explained scheduling process, patient verbalized understanding. ?

## 2020-04-16 ENCOUNTER — Ambulatory Visit: Payer: Medicaid Other | Attending: Critical Care Medicine

## 2020-04-16 ENCOUNTER — Other Ambulatory Visit: Payer: Self-pay

## 2020-04-16 ENCOUNTER — Ambulatory Visit
Admission: RE | Admit: 2020-04-16 | Discharge: 2020-04-16 | Disposition: A | Discharge status: Home / Self Care | Payer: Medicaid Other | Source: Ambulatory Visit | Attending: Critical Care Medicine | Admitting: Critical Care Medicine

## 2020-04-16 DIAGNOSIS — J441 Chronic obstructive pulmonary disease with (acute) exacerbation: Secondary | ICD-10-CM

## 2020-04-16 DIAGNOSIS — J449 Chronic obstructive pulmonary disease, unspecified: Secondary | ICD-10-CM | POA: Diagnosis not present

## 2020-04-16 DIAGNOSIS — J939 Pneumothorax, unspecified: Secondary | ICD-10-CM

## 2020-04-16 MED ORDER — SERTRALINE HCL 50 MG PO TABS
50.0000 mg | ORAL_TABLET | Freq: Every day | ORAL | 3 refills | Status: DC
  Filled 2020-04-16: qty 30, 30d supply, fill #0
  Filled 2020-05-29: qty 30, 30d supply, fill #1

## 2020-04-16 MED ORDER — METHYLPREDNISOLONE SODIUM SUCC 125 MG IJ SOLR
125.0000 mg | Freq: Once | INTRAMUSCULAR | Status: DC
Start: 2020-04-16 — End: 2020-07-10

## 2020-04-16 NOTE — Addendum Note (Signed)
Addended by: Storm Frisk on: 04/16/2020 06:25 AM   Modules accepted: Orders

## 2020-04-16 NOTE — Telephone Encounter (Signed)
This pt needs RN visit today to receive solumedrol injection, order entered

## 2020-04-22 ENCOUNTER — Other Ambulatory Visit: Payer: Self-pay

## 2020-04-22 MED ORDER — ALBUTEROL SULFATE HFA 108 (90 BASE) MCG/ACT IN AERS
2.0000 | INHALATION_SPRAY | Freq: Four times a day (QID) | RESPIRATORY_TRACT | 1 refills | Status: DC | PRN
  Filled 2020-04-22: qty 8.5, 25d supply, fill #0
  Filled 2020-07-08 – 2020-08-04 (×2): qty 8.5, 25d supply, fill #1

## 2020-04-22 MED FILL — Benzonatate Cap 100 MG: ORAL | 14 days supply | Qty: 42 | Fill #0 | Status: CN

## 2020-04-22 MED FILL — Pantoprazole Sodium EC Tab 40 MG (Base Equiv): ORAL | 30 days supply | Qty: 30 | Fill #0 | Status: AC

## 2020-04-24 ENCOUNTER — Other Ambulatory Visit: Payer: Self-pay

## 2020-04-28 DIAGNOSIS — J449 Chronic obstructive pulmonary disease, unspecified: Secondary | ICD-10-CM | POA: Diagnosis not present

## 2020-04-28 DIAGNOSIS — J432 Centrilobular emphysema: Secondary | ICD-10-CM | POA: Diagnosis not present

## 2020-04-30 ENCOUNTER — Other Ambulatory Visit: Payer: Self-pay

## 2020-05-04 DIAGNOSIS — J449 Chronic obstructive pulmonary disease, unspecified: Secondary | ICD-10-CM | POA: Diagnosis not present

## 2020-05-04 DIAGNOSIS — G4733 Obstructive sleep apnea (adult) (pediatric): Secondary | ICD-10-CM | POA: Diagnosis not present

## 2020-05-04 DIAGNOSIS — J432 Centrilobular emphysema: Secondary | ICD-10-CM | POA: Diagnosis not present

## 2020-05-06 DIAGNOSIS — E785 Hyperlipidemia, unspecified: Secondary | ICD-10-CM | POA: Diagnosis not present

## 2020-05-06 DIAGNOSIS — F429 Obsessive-compulsive disorder, unspecified: Secondary | ICD-10-CM | POA: Diagnosis not present

## 2020-05-06 DIAGNOSIS — J439 Emphysema, unspecified: Secondary | ICD-10-CM | POA: Diagnosis not present

## 2020-05-06 DIAGNOSIS — Z8709 Personal history of other diseases of the respiratory system: Secondary | ICD-10-CM | POA: Diagnosis not present

## 2020-05-06 DIAGNOSIS — K219 Gastro-esophageal reflux disease without esophagitis: Secondary | ICD-10-CM | POA: Diagnosis not present

## 2020-05-06 DIAGNOSIS — R918 Other nonspecific abnormal finding of lung field: Secondary | ICD-10-CM | POA: Diagnosis not present

## 2020-05-06 DIAGNOSIS — Z87891 Personal history of nicotine dependence: Secondary | ICD-10-CM | POA: Diagnosis not present

## 2020-05-06 DIAGNOSIS — R0602 Shortness of breath: Secondary | ICD-10-CM | POA: Diagnosis not present

## 2020-05-06 DIAGNOSIS — J432 Centrilobular emphysema: Secondary | ICD-10-CM | POA: Diagnosis not present

## 2020-05-06 DIAGNOSIS — J9811 Atelectasis: Secondary | ICD-10-CM | POA: Diagnosis not present

## 2020-05-06 DIAGNOSIS — Z95828 Presence of other vascular implants and grafts: Secondary | ICD-10-CM | POA: Diagnosis not present

## 2020-05-06 DIAGNOSIS — L409 Psoriasis, unspecified: Secondary | ICD-10-CM | POA: Diagnosis not present

## 2020-05-19 DIAGNOSIS — K4030 Unilateral inguinal hernia, with obstruction, without gangrene, not specified as recurrent: Secondary | ICD-10-CM

## 2020-05-19 NOTE — Telephone Encounter (Signed)
[place]  pls get this patient in for appt with DR Lindie Spruce to evaluate his inguinal hernia

## 2020-05-20 DIAGNOSIS — L400 Psoriasis vulgaris: Secondary | ICD-10-CM | POA: Diagnosis not present

## 2020-05-27 ENCOUNTER — Telehealth: Payer: Self-pay | Admitting: Critical Care Medicine

## 2020-05-27 DIAGNOSIS — Z1159 Encounter for screening for other viral diseases: Secondary | ICD-10-CM

## 2020-05-27 DIAGNOSIS — Z111 Encounter for screening for respiratory tuberculosis: Secondary | ICD-10-CM

## 2020-05-27 DIAGNOSIS — Z114 Encounter for screening for human immunodeficiency virus [HIV]: Secondary | ICD-10-CM

## 2020-05-27 NOTE — Telephone Encounter (Signed)
Pt needs labs for dermatology MD  I ordered the requested labs   See paperwork, results need to go to [facility] attn Mickel Crow

## 2020-05-28 DIAGNOSIS — J449 Chronic obstructive pulmonary disease, unspecified: Secondary | ICD-10-CM | POA: Diagnosis not present

## 2020-05-28 DIAGNOSIS — J432 Centrilobular emphysema: Secondary | ICD-10-CM | POA: Diagnosis not present

## 2020-05-29 ENCOUNTER — Other Ambulatory Visit: Payer: Self-pay

## 2020-05-29 MED FILL — Pantoprazole Sodium EC Tab 40 MG (Base Equiv): ORAL | 30 days supply | Qty: 30 | Fill #1 | Status: AC

## 2020-05-30 ENCOUNTER — Other Ambulatory Visit: Payer: Medicaid Other

## 2020-06-03 ENCOUNTER — Other Ambulatory Visit: Payer: Self-pay

## 2020-06-03 ENCOUNTER — Ambulatory Visit: Payer: Medicaid Other | Attending: Critical Care Medicine

## 2020-06-03 DIAGNOSIS — Z111 Encounter for screening for respiratory tuberculosis: Secondary | ICD-10-CM | POA: Diagnosis not present

## 2020-06-03 DIAGNOSIS — Z114 Encounter for screening for human immunodeficiency virus [HIV]: Secondary | ICD-10-CM | POA: Diagnosis not present

## 2020-06-03 DIAGNOSIS — Z1159 Encounter for screening for other viral diseases: Secondary | ICD-10-CM | POA: Diagnosis not present

## 2020-06-04 DIAGNOSIS — J449 Chronic obstructive pulmonary disease, unspecified: Secondary | ICD-10-CM | POA: Diagnosis not present

## 2020-06-04 DIAGNOSIS — G4733 Obstructive sleep apnea (adult) (pediatric): Secondary | ICD-10-CM | POA: Diagnosis not present

## 2020-06-04 DIAGNOSIS — J432 Centrilobular emphysema: Secondary | ICD-10-CM | POA: Diagnosis not present

## 2020-06-05 LAB — HIV ANTIBODY (ROUTINE TESTING W REFLEX): HIV Screen 4th Generation wRfx: NONREACTIVE

## 2020-06-05 LAB — QUANTIFERON-TB GOLD PLUS
QuantiFERON Mitogen Value: >10 IU/mL
QuantiFERON Nil Value: 0.2 IU/mL
QuantiFERON TB1 Ag Value: 0.31 IU/mL
QuantiFERON TB2 Ag Value: 0.24 IU/mL
QuantiFERON-TB Gold Plus: NEGATIVE

## 2020-06-05 LAB — HEPATITIS B CORE AB W/REFLEX: Hep B Core Total Ab: NEGATIVE

## 2020-06-05 LAB — HCV INTERPRETATION

## 2020-06-05 LAB — HCV AB W REFLEX TO QUANT PCR: HCV Ab: <0.1 s/co ratio (ref 0.0–0.9)

## 2020-06-05 LAB — HEPATITIS A ANTIBODY, TOTAL: hep A Total Ab: NEGATIVE

## 2020-06-06 ENCOUNTER — Telehealth: Payer: Self-pay

## 2020-06-06 NOTE — Telephone Encounter (Signed)
Patient name and DOB has been verified Patient was informed of lab results. Patient had no questions.  

## 2020-06-06 NOTE — Telephone Encounter (Signed)
-----   Message from Storm Frisk, MD sent at 06/04/2020  8:28 AM EDT ----- Let pt know All studies neg for infection.  Pls provide patient copies of results to take to his [facility] appts

## 2020-06-18 ENCOUNTER — Telehealth (INDEPENDENT_AMBULATORY_CARE_PROVIDER_SITE_OTHER): Payer: Self-pay

## 2020-06-18 DIAGNOSIS — K4030 Unilateral inguinal hernia, with obstruction, without gangrene, not specified as recurrent: Secondary | ICD-10-CM

## 2020-06-18 NOTE — Telephone Encounter (Signed)
Referral resent to [place]

## 2020-06-18 NOTE — Telephone Encounter (Signed)
Patient was called to cancel appointment with Dr. Lindie Spruce since he will not be in the office on June 23. Patient would like to be referred to a specialty provider for his inguinal hernia.  Patient has full medicaid.  Please advise and send referral.  Thank you

## 2020-06-18 NOTE — Addendum Note (Signed)
Addended by: Storm Frisk on: 06/18/2020 06:16 PM   Modules accepted: Orders

## 2020-06-20 ENCOUNTER — Telehealth (HOSPITAL_COMMUNITY): Payer: Self-pay

## 2020-06-20 NOTE — Telephone Encounter (Signed)
Called patient to see if he was interested in participating in the Pulmonary Rehab Program. Patient stated yes. Patient will come in for orientation on 07/11/20 @ 9AM and will attend the 1:15PM exercise class.  Pensions consultant.

## 2020-06-25 DIAGNOSIS — K4090 Unilateral inguinal hernia, without obstruction or gangrene, not specified as recurrent: Secondary | ICD-10-CM | POA: Diagnosis not present

## 2020-06-26 ENCOUNTER — Ambulatory Visit: Payer: Medicaid Other | Admitting: General Surgery

## 2020-06-28 DIAGNOSIS — J432 Centrilobular emphysema: Secondary | ICD-10-CM | POA: Diagnosis not present

## 2020-06-28 DIAGNOSIS — J449 Chronic obstructive pulmonary disease, unspecified: Secondary | ICD-10-CM | POA: Diagnosis not present

## 2020-07-04 DIAGNOSIS — J432 Centrilobular emphysema: Secondary | ICD-10-CM | POA: Diagnosis not present

## 2020-07-04 DIAGNOSIS — J449 Chronic obstructive pulmonary disease, unspecified: Secondary | ICD-10-CM | POA: Diagnosis not present

## 2020-07-08 ENCOUNTER — Other Ambulatory Visit: Payer: Self-pay

## 2020-07-10 ENCOUNTER — Telehealth (HOSPITAL_COMMUNITY): Payer: Self-pay | Admitting: *Deleted

## 2020-07-10 ENCOUNTER — Other Ambulatory Visit: Payer: Self-pay

## 2020-07-10 ENCOUNTER — Ambulatory Visit: Payer: Medicaid Other | Attending: Critical Care Medicine | Admitting: Critical Care Medicine

## 2020-07-10 ENCOUNTER — Encounter: Payer: Self-pay | Admitting: Critical Care Medicine

## 2020-07-10 ENCOUNTER — Other Ambulatory Visit: Payer: Self-pay | Admitting: Critical Care Medicine

## 2020-07-10 VITALS — BP 116/79 | HR 87 | Temp 98.2°F | Resp 16 | Ht 72.01 in | Wt 196.4 lb

## 2020-07-10 DIAGNOSIS — F411 Generalized anxiety disorder: Secondary | ICD-10-CM | POA: Insufficient documentation

## 2020-07-10 DIAGNOSIS — J309 Allergic rhinitis, unspecified: Secondary | ICD-10-CM | POA: Diagnosis not present

## 2020-07-10 DIAGNOSIS — F429 Obsessive-compulsive disorder, unspecified: Secondary | ICD-10-CM | POA: Insufficient documentation

## 2020-07-10 DIAGNOSIS — L400 Psoriasis vulgaris: Secondary | ICD-10-CM | POA: Diagnosis not present

## 2020-07-10 DIAGNOSIS — Z87891 Personal history of nicotine dependence: Secondary | ICD-10-CM | POA: Insufficient documentation

## 2020-07-10 DIAGNOSIS — F419 Anxiety disorder, unspecified: Secondary | ICD-10-CM

## 2020-07-10 DIAGNOSIS — K4031 Unilateral inguinal hernia, with obstruction, without gangrene, recurrent: Secondary | ICD-10-CM

## 2020-07-10 DIAGNOSIS — Z8249 Family history of ischemic heart disease and other diseases of the circulatory system: Secondary | ICD-10-CM | POA: Diagnosis not present

## 2020-07-10 DIAGNOSIS — G4733 Obstructive sleep apnea (adult) (pediatric): Secondary | ICD-10-CM | POA: Insufficient documentation

## 2020-07-10 DIAGNOSIS — Z8616 Personal history of COVID-19: Secondary | ICD-10-CM | POA: Diagnosis not present

## 2020-07-10 DIAGNOSIS — K219 Gastro-esophageal reflux disease without esophagitis: Secondary | ICD-10-CM | POA: Insufficient documentation

## 2020-07-10 DIAGNOSIS — Z79899 Other long term (current) drug therapy: Secondary | ICD-10-CM | POA: Diagnosis not present

## 2020-07-10 DIAGNOSIS — J431 Panlobular emphysema: Secondary | ICD-10-CM

## 2020-07-10 MED ORDER — TRAZODONE HCL 50 MG PO TABS
25.0000 mg | ORAL_TABLET | Freq: Every evening | ORAL | 3 refills | Status: DC | PRN
  Filled 2020-07-10 – 2020-07-17 (×2): qty 30, 30d supply, fill #0

## 2020-07-10 MED ORDER — BENZONATATE 100 MG PO CAPS
ORAL_CAPSULE | Freq: Three times a day (TID) | ORAL | 0 refills | Status: DC
  Filled 2020-07-10 – 2020-07-17 (×2): qty 42, 14d supply, fill #0

## 2020-07-10 NOTE — Assessment & Plan Note (Signed)
Left inguinal hernia certainly is an issue it is gotten worse I do want him to proceed with this procedure and I cleared him medically for this

## 2020-07-10 NOTE — Assessment & Plan Note (Signed)
I do not think this patient would benefit from Klonopin have declined this prescription I will give him trazodone to help with sleep  He does have a mental health provider I recommend he follow-up with a mental health provider also recommend he get the sertraline and take it but he declines to do so

## 2020-07-10 NOTE — Patient Instructions (Signed)
Trial of trazodone 50 mg at bedtime for sleep  Refill on Tessalon given  Please use 1 L of oxygen at night to sleep with until you decide about the CPAP machine contact [facility] to see if it is okay Richard Fuentes can restart your CPAP therapy at night  We will inquire about what other inhalers are an option for you with your Medicaid plan  You are cleared to have your hernia surgery repair I will let the surgeon know  Return to see Dr. Delford Field for primary care 4 months

## 2020-07-10 NOTE — Progress Notes (Signed)
Subjective:    Patient ID: Richard Fuentes, male    DOB: November 17, 1970, 50 y.o.   MRN: [id number] History of Present Illness: First visit March 20, 2019 50 y.o.M with Copd/ asthma with freq ED visits ref from PCP for pulmonary evaluation The patient notes since 2013 the onset of asthma type symptoms of the progressively gotten worse he is never seen a pulmonologist.  At that time he also had significant periodontal disease and a dental infection that was not addressed.  Patient has history of generalized anxiety disorder and obsessive-compulsive disorder and this prevents him from taking medications at times.  He is very fearful of any particular treatments and tries to minimize medications.  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  He was in the emergency room in February given a pulse dose of prednisone but did not seem to help.  He has been followed at [facility] however he is on no medications does not return to [facility] since prior to Covid onset.  He did himself develop Covid viral infection in January and since that time has had chronic fatigue.  The patient is on Symbicort 2 inhalations twice daily through patient assistance and states despite this is not under optimal control.  He states he has difficulty with laying flat also difficulty with eating meals without becoming full early The patient has not had pulmonary functions as of now  05/01/2019 Patient seen in return follow-up for severe COPD with bullous emphysema.  CT scan of chest has been obtained and shows severe bullous emphysema in the apices but also centrilobular emphysema diffusely.  The patient states the Trelegy inhaler has been helpful however the patient continues to have difficulty with dyspnea on exertion.  He wakens at night with severe shortness of breath as well.  He no  longer is smoking tobacco products.  Anxiety is a major issue for this patient and he states the antianxiety medication has been of some benefit at this time.  The patient's pulmonary functions have been obtained and show Gold stage D COPD with FEV1 less than 30% predicted diffusion capacity less than 30% predicted and severe residual volume air trapping  06/05/2019 This is a video visit and the patient returns in follow-up and does have an upcoming appointment at the [facility] transplant center and has received Medicaid for disability  Patient still has severe dyspnea with minimal exertion.  He has a sleep study pending for July.  He is now on multivitamins.  He complains of worsening plaque psoriasis is requesting dermatology referral.  He has had psoriasis for 14 years but it appears to be worsening more recently.  He has a nonproductive cough has difficulty raising secretions at this time this appears to have worsened as well.  He maintains Trelegy daily and uses his nebulizer at least twice daily and does have a flutter valve in place  07/31/2019 This is a face-to-face visit follow-up for severe COPD end-stage Gold stage D centrilobular emphysema.  The patient also has severe plaque psoriasis. Note this patient went to a dermatologist for the plaque psoriasis and it was felt that the patient would only benefit from topical therapy that he had tried these before and did not see improvement.  He states his psoriasis is progressing.  Note the patient is already been screened to the transplant clinic for  tuberculosis hepatitis AB and C all of which were negative  Patient also is in the transplant clinic being evaluated for potential transplant they wish to hold on this for now.  He is also being evaluated for potential lung volume reduction  Patient's blood gases in the clinic are unchanged recent chest x-ray is unchanged CT scan is pending  Patient states his dyspnea is so severe he cannot complete a  shower and is requesting a shower stool he also would like an alpha-1 antitrypsin level checked I thought we had checked this previously but no evidence of this is seen so an alpha-1 level needs to be checked   Patient states he is also requesting a cardiology referral as [facility] is requesting this as part of his pulmonary transplant eval  09/13/2019 Video visit The patient wishes to follow-up to discuss several issues #1 cost of covering pulmonary rehab #2 concerns over psoriasis wishing a second opinion #3 needing CPAP machine now that he has Medicaid #4 cost of placing pulmonary valve treatments and the lack of Medicaid coverage  Patient went through mediation and it was determined by Medicaid his pulmonary valves would not be covered therefore he is not having this done.  The patient needs financial assistance for pulmonary rehab or attempting to identify funds for this.  Patient now has Medicaid is wishing a CPAP machine.  Patient complains of lack of progress with his dermatologist which is a second opinion.  There is been no real change in the patient's level of dyspnea.  His transplant of his lung needs to be put off for a period of time.  10/24/2019 Patient seen return follow-up still has severe COPD symptoms with shortness of breath with minimal exertion.  He has been to [facility] for transplantation evaluation.  He is a state he is not far enough along to receive this yet.  The patient now has insurance access and is wishing to proceed with CPAP machine because of he has sleep apnea due to the elevated AHI  The patient was not able to achieve the bronchial valves at [facility] because his insurance would not cover  04/08/2020 This is a primary care follow-up visit for this patient with sleep apnea advanced emphysematous COPD status post recent bronchial valve placements in the left upper lobe with 4 different valves placed in the left upper lobe.  Patient had complete collapse of the left upper lobe he  did receive a complication of a pneumothorax in the left lung which was resolved with 2 chest tubes.  Below is the discharge summary at the [facility] where this procedure was performed The hospitalizations occurred in March last month. Richard Fuentes is a 50 y.o. male PMH COPD, tobacco use (68 pack years, quit 2021), OCD, HLD, GERD presenting for routine postoperative monitoring s/p endobronchial valve placement 3/17.   Increasing DOE, cough since late 2020. Worsening symptoms since COVID infection 01/2019. COPD exacerbations 3-4x/year for which he is usually treated with prednisone. Not on home oxygen. Transplant workup ongoing.   Last PFTs 02/2020 w/ FEV1/FVC 39% predicted, DLCO 32% predicted. CT scan 08/2019 w/ stable severe upper lobe emphysema. Echo 02/2020 WNL. Patient underwent lung volume reduction with placement of 4 valves in left upper lobe today. Postprocedure patient had acute chest discomfort and found to have moderate left pneumothorax, with placement of pigtail catheter in the left and lower mid axillary line with transient air leak. Follow-up chest x-ray with worsening pneumothorax, chest tube was placed to -20 cm water  suction. Repeat chest x-ray in 1 hour showed progressive pneumothorax which prompted placement of additional chest tube superiorly along left midaxillary line with immediate gush of air was noted with rapid Ronald reexpansion. Repeat chest x-ray with improvement of left pneumothorax. Currently both chest tubes are to waterseal."  HOSPITAL COURSE BY PROBLEM   # COPD # s/p endobronchial valve placement 3/17 # post-operative L pneumothorax s/p chest tube placementX2  # Hypoxic respiratory failure after endobronchial valves Mr. Karim had a postoperative pneumothorax requring chest tube x2. Lower chest tube removed 3/20 as imaging revealed to mostly be in mediastinal fat - Pneumothoraces resolved and chest tubes removed prior to discharge - Interventional pulm  followed - pain regimen: - Acetaminophen 975mg  PO TID - Oxycodone 5-10mg  q6h PRN - Bowel regimen with BID miralax and senokot - continued with trelegy Ellipta inhalation daily - continued with DuoNebs as needed. - hypertonic saline and mucinex for airway clearance - PT assessment for mobility and O2 requirement revealed need for 2 LPM via [place] at discharge. Home oxygen set up for discharge   #elevated Cr Baseline 1.0, elevated to 1.6. PO intake has been marginal so likely prerenal in origin. - 1L LR - Resolved and creatinine back to baseline after 1L LR   #OSA on CPAP VBG reassuring. We will hold CPAP in the setting of pneumothorax. - O2 via    # OCD ?  Generalized anxiety disorder Untreated, will need psych evaluation before transplant - CTM, psych mgmt per outpatient transplant team - PRN Atarax for anxiety.   # GERD - continue pantoprazole 40mg  PO QD - TUMS BID with lunch and dinner   # Allergic rhinitis - Loratadine 5mg  PO QD   #Psoriatic skin lesions Not on topical therapy. Usually uses calcipotriene ointment at home but left it when coming in and not available on hospital formulary. - Triamcinolone  Procedure(s): BRONCHOSCOPY, RIGID OR FLEXIBLE, INCL FLUOROSCOPIC GUIDANCE, WHEN PERFORMED; WITH BALLOON OCCLUSION, WHEN PERFORMED, ASSESSMENT AIR LEAK, AIRWAY SIZING, AND INSERTION OF BRONCHIAL VALVE, INITIAL LOBE  DISCHARGE PHYSICAL EXAM   BP 113/68 (BP Location: Left upper arm, Patient Position: Sitting)  Pulse 85  Temp 36.9 C (98.5 F) (Oral)  Resp 18  Ht 185.4 cm (6' 0.99")  Wt 90 kg (198 lb 6.6 oz)  SpO2 97%  BMI 26.18 kg/m   Post discharge patient has seen cardiology and they recommended healthy diet alone for the patient's elevated cholesterol and no statin therapy indicated.  His calcium coronary score was low. Saw cardiology 04/01/20:  Hyperlipidemia-LDL 165 on 12/07/2018.  Coronary calcium score of 4, low risk.  Previously had a conversation about  potentially starting statin therapy.  He wished to defer during previous appointment.  Red yeast rice, co-Q10, and dietary modifications were discussed. Heart healthy low-sodium high-fiber diet.   Increase physical activity as tolerated Order fasting lipids    Family history of premature coronary artery disease-denies any recent episodes of chest pain, arm neck or throat discomfort.  Was not able to complete his coronary CTA due to hypotension beta-blocker therapy.  Coronary calcium score 4. Heart healthy low-sodium diet-salty 6 given Increase physical activity as tolerated   Patient did go to the emergency room on April 2 because of tachycardia and hypoxemia he was were observed and then let go  The patient has severe obsessive-compulsive disorder obsesses over every small thing and gets quite anxious at times I suspect is hyperventilating becomes very tachycardic with this.  On arrival pulse was 123 blood pressure  133/78 saturation was 98% on 2 L  No patient was discharged home on 2 L.  He does have paroxysms of cough minimally productive.  The bronchoscope  note showed at the time of pulmonary valve placement there was minimal secretions in the left upper lobe  Review shortness of breath assessment  07/10/2020 Patient seen face-to-face for preop clearance for left inguinal herniorrhaphy which seems to occur from general surgery Dr. Derrell Lolling.  The patient had undergone pulmonary valve placements to the left upper lobe and this produced significant improvement in his status his cough is reduced he is on less oxygen.  Note he did desaturate at night with his sleep study in January of this year he supposed to be on a CPAP machine but has been holding until he heals up from the pulmonary valve placements.  When he records his oxygen sats on his phone at night using a pulse oximeter on the wrist he does see sats in the low 80s at times.  He states he has quite a bit of fatigue has trouble sleeping is  requesting a sleeping pill.  He is still having some cough but it is minimal controlled well with benzonatate.  He is requesting something for anxiety.  He was wanting Klonopin which we declined at this visit.  Patient was given a prescription for sertraline but he declined to fill this and take this.  With all the coughing he had from the pulmonary valve placements he did develop a left inguinal hernia that is worsened over time.  Patient was seen Dr. Precious Gilding surgery and has wants to schedule him for a left inguinal herniorrhaphy.  He needs medical clearance for this.   Preop clearance  Shortness of Breath This is a chronic problem. The current episode started more than 1 year ago. The problem occurs constantly. The problem has been rapidly improving. Pertinent negatives include no chest pain, ear pain, headaches, hemoptysis, orthopnea, PND, rash, rhinorrhea, sputum production or wheezing. The symptoms are aggravated by any activity, exercise, weather changes, fumes, odors, pollens, emotional upset and eating. Risk factors include smoking. He has tried beta agonist inhalers, oral steroids and steroid inhalers for the symptoms. His past medical history is significant for allergies, asthma, COPD and pneumonia. There is no history of CAD, DVT, a heart failure or PE.  Past Medical History:  Diagnosis Date   Anxiety    Asthma    COPD (chronic obstructive pulmonary disease) (HCC)    Severe centrilobular and paraseptal bullous emphysema; Gold stage D; FEV1 and DLCO<30%.;  Currently undergoing transplant evaluation   Environmental allergies    Esophagitis    Hx of migraines    OCD (obsessive compulsive disorder)    Pneumothorax on left 03/20/2020   Psoriasis    Urticaria      Family History  Problem Relation Age of Onset   Multiple sclerosis Sister    Psoriasis Maternal Grandmother    Heart attack Mother        Late in life   Heart attack Father 1   CAD Father    Heart  attack Paternal Grandfather 61       Sudden cardiac death   Sudden Cardiac Death Paternal Grandfather 73   Heart attack Maternal Uncle    CAD Maternal Uncle    Heart attack Cousin 89   Sudden Cardiac Death Cousin 24   Allergic rhinitis Neg Hx    Angioedema Neg Hx    Asthma Neg Hx  Eczema Neg Hx    Immunodeficiency Neg Hx    Urticaria Neg Hx      Social History   Socioeconomic History   Marital status: Single    Spouse name: Not on file   Number of children: Not on file   Years of education: Not on file   Highest education level: Not on file  Occupational History   Not on file  Tobacco Use   Smoking status: Former    Packs/day: 1.00    Years: 30.00    Pack years: 30.00    Types: Cigarettes    Quit date: 01/19/2018    Years since quitting: 2.4   Smokeless tobacco: Never  Vaping Use   Vaping Use: Never used  Substance and Sexual Activity   Alcohol use: No   Drug use: No   Sexual activity: Not Currently  Other Topics Concern   Not on file  Social History Narrative   Not on file   Social Determinants of Health   Financial Resource Strain: Not on file  Food Insecurity: Not on file  Transportation Needs: Not on file  Physical Activity: Not on file  Stress: Not on file  Social Connections: Not on file  Intimate Partner Violence: Not on file     Allergies  Allergen Reactions   Other Anaphylaxis    Wool   Shellfish Allergy      Outpatient Medications Prior to Visit  Medication Sig Dispense Refill   albuterol (VENTOLIN HFA) 108 (90 Base) MCG/ACT inhaler Inhale 2 puffs into the lungs every 6 (six) hours as needed for wheezing or shortness of breath. 8.5 g 1   EPINEPHrine 0.3 mg/0.3 mL IJ SOAJ injection Inject 0.3 mg into the muscle as needed for anaphylaxis.     Fluticasone-Umeclidin-Vilant 200-62.5-25 MCG/INH AEPB INHALE 1 PUFF INTO THE LUNGS DAILY. 60 each 3   HUMIRA PEN 40 MG/0.4ML PNKT Inject into the skin.     hydrocortisone 2.5 % cream Apply  liberally  to affected area twice a day     ibuprofen (ADVIL) 200 MG tablet Take 600-800 mg by mouth every 6 (six) hours as needed for mild pain.     ipratropium-albuterol (DUONEB) 0.5-2.5 (3) MG/3ML SOLN Inhale 3 mLs into the lungs every 6 (six) hours as needed (sob).     levocetirizine (XYZAL) 5 MG tablet Take 1 tablet once daily as needed for runny nose or itchy eyes. 34 tablet 5   pantoprazole (PROTONIX) 40 MG tablet TAKE 1 TABLET (40 MG TOTAL) BY MOUTH DAILY. 30 tablet 3   Vitamin D, Ergocalciferol, (DRISDOL) 1.25 MG (50000 UNIT) CAPS capsule TAKE 1 CAPSULE BY MOUTH ONCE A WEEK 8 capsule 0   benzonatate (TESSALON) 100 MG capsule TAKE 1 CAPSULE BY MOUTH EVERY 8 HOURS 42 capsule 0   doxycycline (VIBRA-TABS) 100 MG tablet Take 1 tablet (100 mg total) by mouth 2 (two) times daily. 14 tablet 0   pantoprazole (PROTONIX) 40 MG tablet Take 1 tablet (40 mg total) by mouth daily. 30 tablet 3   sertraline (ZOLOFT) 50 MG tablet Take 1 tablet (50 mg total) by mouth daily. 30 tablet 3   TRELEGY ELLIPTA 200-62.5-25 MCG/INH AEPB INHALE 1 PUFF INTO THE LUNGS DAILY. 60 each 3   Vitamin D, Ergocalciferol, (DRISDOL) 1.25 MG (50000 UNIT) CAPS capsule Take 1 capsule (50,000 Units total) by mouth every 7 (seven) days. 12 capsule 1   methylPREDNISolone sodium succinate (SOLU-MEDROL) 125 mg/2 mL injection 125 mg  No facility-administered medications prior to visit.      Review of Systems  HENT:  Negative for ear pain and rhinorrhea.   Respiratory:  Positive for shortness of breath. Negative for hemoptysis, sputum production and wheezing.   Cardiovascular:  Negative for chest pain, orthopnea and PND.  Skin:  Negative for rash.  Neurological:  Negative for headaches.      Objective:   Physical Exam Vitals:   07/10/20 0952  BP: 116/79  Pulse: 87  Resp: 16  Temp: 98.2 F (36.8 C)  SpO2: 96%  Weight: 196 lb 6.4 oz (89.1 kg)  Height: 6' 0.01" (1.829 m)   Gen: well-nourished, in no distress, anxious affect,  push of speech often answering my questions with medical interpretations of his symptoms  ENT: No lesions,  mouth clear,  oropharynx clear, no postnasal drip  Neck: No JVD, no TMG, no carotid bruits  Lungs: No use of accessory muscles, no dullness to percussion, distant breath sounds hyperresonance to percussion,    Cardiovascular: RRR, heart sounds normal, no murmur or gallops, no peripheral edema  Abdomen: soft and NT, no HSM,  BS normal  Musculoskeletal: No deformities, no cyanosis or clubbing  Neuro: alert, non focal  Skin: Warm, no lesions or rashes   CXR reviewed 02/2019:  Hyperinflation and changes c/w centrilobular emphysema  Sleep study 08/2019 IMPRESSIONS - Moderate obstructive sleep apnea occurred during this study (AHI = 21.7/h). - No significant central sleep apnea occurred during this study (CAI = 0.0/h). - Oxygen desaturation was noted during this study (Min O2 = 82%). Mean sat 93%. - Patient snored.   DIAGNOSIS - Obstructive Sleep Apnea (G47.33)   RECOMMENDATIONS - Suggest CPAP titration sleep study or autopap. Otherr options would be based on clinical judgment. - Be careful with alcohol, sedatives and other CNS depressants that may worsen sleep apnea and disrupt normal sleep architecture. - Sleep hygiene should be reviewed to assess factors that may improve sleep quality. - Weight management and regular exercise should be initiated or continued.   CT Chest: 4/20 FINDINGS: Cardiovascular: Heart is normal in size. Trace anterior pericardial fluid.   No evidence of thoracic aortic aneurysm.   Mediastinum/Nodes: No suspicious mediastinal lymphadenopathy.   Visualized thyroid is unremarkable.   Lungs/Pleura: Moderate to severe centrilobular and paraseptal emphysematous changes, upper lung predominant.   No focal consolidation.   No suspicious pulmonary nodules.   No pleural effusion or pneumothorax.   Upper Abdomen: Visualized upper abdomen is  grossly unremarkable.   Musculoskeletal: Visualized osseous structures are within normal limits.   IMPRESSION: No evidence of acute cardiopulmonary disease.   Emphysema (ICD10-J43.9).   PFTs 4/6:  Ref Range & Units 3 wk ago  FVC-Pre L 2.94   FVC-%Pred-Pre % 52   FVC-Post L 3.02   FVC-%Pred-Post % 54   FVC-%Change-Post % 2   FEV1-Pre L 1.12   FEV1-%Pred-Pre % 25   FEV1-Post L 1.24   FEV1-%Pred-Post % 28   FEV1-%Change-Post % 10   FEV6-Pre L 2.40   FEV6-%Pred-Pre % 44   FEV6-Post L 2.75   FEV6-%Pred-Post % 51   FEV6-%Change-Post % 14   Pre FEV1/FVC ratio % 38   FEV1FVC-%Pred-Pre % 49   Post FEV1/FVC ratio % 41   FEV1FVC-%Change-Post % 7   Pre FEV6/FVC Ratio % 82   FEV6FVC-%Pred-Pre % 84   Post FEV6/FVC ratio % 91   FEV6FVC-%Pred-Post % 94   FEV6FVC-%Change-Post % 11   FEF 25-75 Pre L/sec 0.36   FEF2575-%Pred-Pre %  9   FEF 25-75 Post L/sec 0.53   FEF2575-%Pred-Post % 14   FEF2575-%Change-Post % 49   RV L 5.62   RV % pred % 255   TLC L 8.43   TLC % pred % 111   DLCO unc ml/min/mmHg 10.64   DLCO unc % pred % 33   DL/VA ml/min/mmHg/L 4.09   DL/VA % pred % 61    Below overall the imaging studies obtained during his [facility] hospitalization 03/2020 imaging:  IMAGING AND DIAGNOSTIC PROCEDURES   X-ray chest single view portable  Result Date: 03/21/2020 XR CHEST SINGLE VIEW PORTABLE INDICATION: Tube Position, Lung Aeration, J93.9 Pneumothorax, unspecified COMPARISON: Today at 5:59 AM FINDINGS/IMPRESSION: The 2 left-sided chest tubes are unchanged in position. Endobronchial valves in the left hilum grossly unchanged. A small left basilar pneumothorax is likely still present. No significant apical component. No other change.. Electronically Signed by: Loyal Gambler, MD, [facility] Electronically Signed on: 03/21/2020 1:11 PM  X-ray chest single view portable  Result Date: 03/21/2020 XR CHEST SINGLE VIEW PORTABLE INDICATION: Lung Aeration, J43.2 Centrilobular emphysema  (CMS-HCC) COMPARISON: 03/20/2020 FINDINGS: Stable cardiac and mediastinal contours. Redemonstrated 2 left pleural drainage catheters. Left basilar pneumothorax. No pleural effusion. Asymmetric heterogeneous left pulmonary opacities centered around the left upper lobe endobronchial valves. IMPRESSION: 1. Persistent left basilar pneumothorax, no left apical pneumothorax. 2. Heterogeneous left lung opacities, centered around the endobronchial valve, likely atelectasis. Electronically Reviewed by: Cecile Hearing, MD, [facility] Electronically Reviewed on: 03/21/2020 10:09 AM I have reviewed the images and concur with the above findings. Electronically Signed by: Lennice Sites, MD, [facility] Electronically Signed on: 03/21/2020 10:19 AM  X-ray chest single view portable  Result Date: 03/21/2020 AP VIEW OF THE CHEST CLINICAL INFORMATION: Lung Aeration, J95.811 Postprocedural pneumothorax. COMPARISON: Prior exam March 10, 2020 FINDINGS/IMPRESSION: 1. Stable support apparatus. 2. Mild interval decreased size of left pneumothorax. 3. Stable cardiac and mediastinal silhouette. 4. Unchanged appearance of the right lung. Electronically Signed by: Imagene Sheller, MD, [facility] Electronically Signed on: 03/21/2020 6:57 AM  X-ray chest single view portable  Result Date: 03/21/2020 AP VIEW OF THE CHEST CLINICAL INFORMATION: Tube Position, Lung Aeration, J95.811 Postprocedural pneumothorax. COMPARISON: Prior March 20, 2020 FINDINGS/IMPRESSION: 1. Interval placement of additional left pigtail catheter. Prior left basilar pigtail catheter remains in place. 2. Mild decreased size of large left pneumothorax. 3. Cardiomediastinal contours are stable. 4. Right lung is stable in appearance. 5. No pleural effusion. Electronically Signed by: Imagene Sheller, MD, [facility] Electronically Signed on: 03/21/2020 5:58 AM  X-ray chest single view portable  Result Date: 03/21/2020 AP VIEW OF THE CHEST CLINICAL INFORMATION:  Lung Aeration, J95.811 Postprocedural pneumothorax. COMPARISON: Prior exam March 20, 2020 FINDINGS/IMPRESSION: 1. Left basilar pigtail catheter in stable position. There is persistent large left pneumothorax, which appears mildly in size from prior exam. 2. The cardiac and mediastinal contours are stable. Left lung bronchial valves similar to prior. 3. Right lung is stable appearance with mild hyperinflation. 4. No pleural effusion. Electronically Signed by: Imagene Sheller, MD, [facility] Electronically Signed on: 03/21/2020 5:26 AM  X-ray chest single view portable  Result Date: 03/20/2020 Procedure: Single frontal view of the chest. Indication: Tube Position, Lung Aeration, J95.811 Postprocedural pneumothorax Comparison: 03/20/2020. Impression: 1. Interval placement of a left pigtail drainage catheter. 2. Probable slight increase in size of a large left pneumothorax. 3. New bronchial valves remaining in place. 4. Stable appearance to the right lung which remains hyperinflated. Stable cardiac and mediastinal contours allowing for  differences in patient rotation. Electronically Signed by: Dierdre Forth, MD, [facility] Electronically Signed on: 03/20/2020 5:39 PM  X-ray chest single view portable  Result Date: 03/20/2020 Procedure: Single frontal view of the chest. Indication: Lung Aeration, J43.2 Centrilobular emphysema (CMS-HCC) Comparison: 02/11/2020. Impression: 1. Stable cardiac and mediastinal contours. 2. Bronchial valves now seen overlying the left hilum. 3. New partial atelectasis of the left upper lobe, with moderate sized left pneumothorax. 4. Stable, normal appearance to the right lung and pleural space. Pneumothorax discussed with Dr. Carmie Kanner at 4:01 PM on 03/20/2020. Electronically Signed by: Dierdre Forth, MD, [facility] Electronically Signed on: 03/20/2020 5:17 PM  CT chest without contrast with 3D MIPS Protocol  Result Date: 03/22/2020 Procedure: CT CHEST WITHOUT CONTRAST WITH  3D MIPS PROTOCOL Indication: Pneumothorax, J95.811 Postprocedural pneumothorax. Comparison Exams: CT chest without contrast August 07, 2019. Protocol: Chest CT WO Protocol. Contiguous 0.625 mm axial images with 5 mm axial, 3 mm coronal, and 3 mm sagittal reconstructions were obtained from the neck base through the upper abdomen without IV contrast material. In addition, 3-D maximal intensity projection (MIP) reconstructions were performed to potentially increase the sensitivity for the detection of pulmonary nodules. Findings: FINDINGS: LUNGS/AIRWAYS: The central airways are patent. Severe upper lobe predominant centrilobular emphysema. Left upper lobe opacities with associated volume loss. Heterogeneous appearance of the left upper lobe opacities. High density material in multiple left upper lobe bronchi consistent with history of endobronchial valve placement. PLEURA: Small right hydropneumothorax with an apical and basilar component. Two left chest tubes in place. The coiled portion of the lower pigtail catheter appears to be surrounded by anterior mediastinal fat, series 3 image 340. The superior tube is more peripheral and likely in the pleural space. MEDIASTINUM: No mediastinal or hilar adenopathy. Normal esophagus. HEART/VESSELS: The heart is stable in size. No pericardial effusion. Mild LAD coronary artery calcifications. The aorta is normal in size and contour without significant atherosclerotic calcifications. The pulmonary artery is normal in size. VISIBLE ABDOMEN: Evaluation is limited due to lack of intravenous contrast. Within this limitation, visualized portions appear unremarkable. BONES/SOFT TISSUES/OTHER: No aggressive osseous lesions. Normal thyroid. No axillary adenopathy. IMPRESSION: 1. Small right hydropneumothorax with apical and basilar components. Two left chest tubes in place. The coiled portion of the lower pigtail catheter appears to be surrounded by anterior mediastinal fat. Correlate  with tube function. The superior tube is peripheral and likely in the pleural space. No evidence of kinking. 2. Status post left upper lobe endobronchial valve placement with left upper lobe opacities and volume loss, likely due to collapse. Heterogeneous appearance of the left upper lobe opacities, likely related to streak artifact from arms down position, superimposed infection is not entirely excluded. The findings were discussed with Dr. Desma Maxim via telephone on 03/21/2020 6:47 PM by Vernia Buff, MD, [facility]. The preliminary report (critical or emergent communication) was reviewed prior to this dictation and there are no substantial differences between the preliminary results and the impressions in this final report. Electronically Reviewed by: Vernia Buff, MD, [facility] Electronically Reviewed on: 03/22/2020 10:41 AM I have reviewed the images and concur with the above findings. Electronically Signed by: Allegra Lai, MD, [facility] Electronically Signed on: 03/22/2020 12:52 PM      Assessment & Plan:  I personally reviewed all images and lab data in the [facility] system as well as any outside material available during this office visit and agree with the  radiology impressions.   COPD with emphysema (HCC)GOLD D  COPD  actually improved with pulmonary valve placement resulting in collapse of left upper lobe bullous blebs  The patient can be cleared for planned herniorrhaphy he can undergo general anesthesia note he does have sleep apnea he may need a CPAP device postop patient is going to call [facility] to make sure he can use the CPAP machine at this time I advised him use 1 to 2 L at night during sleep until he is cleared to use a CPAP machine for nocturnal oxygenation issues  Patient to maintain his inhaled medicines as prescribed  The patient is beginning pulmonary rehab I encouraged him to follow through on this  Plaque psoriasis Plaque psoriasis was better with dermatology  although with his Medicaid plan he will not cover Humira and he is working with the dermatologist on achieving this he is using a steroid cream instead for now  Inguinal hernia with obstruction without gangrene Left inguinal hernia certainly is an issue it is gotten worse I do want him to proceed with this procedure and I cleared him medically for this  Anxiety I do not think this patient would benefit from Klonopin have declined this prescription I will give him trazodone to help with sleep  He does have a mental health provider I recommend he follow-up with a mental health provider also recommend he get the sertraline and take it but he declines to do so   Richard Fuentes was seen today for follow-up.  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

## 2020-07-10 NOTE — Assessment & Plan Note (Addendum)
COPD actually improved with pulmonary valve placement resulting in collapse of left upper lobe bullous blebs  The patient can be cleared for planned herniorrhaphy he can undergo general anesthesia note he does have sleep apnea he may need a CPAP device postop patient is going to call [facility] to make sure he can use the CPAP machine at this time I advised him use 1 to 2 L at night during sleep until he is cleared to use a CPAP machine for nocturnal oxygenation issues  Patient to maintain his inhaled medicines as prescribed  The patient is beginning pulmonary rehab I encouraged him to follow through on this

## 2020-07-10 NOTE — Progress Notes (Signed)
Pt presents for pre-op clearance

## 2020-07-10 NOTE — Assessment & Plan Note (Signed)
Plaque psoriasis was better with dermatology although with his Medicaid plan he will not cover Humira and he is working with the dermatologist on achieving this he is using a steroid cream instead for now

## 2020-07-10 NOTE — Telephone Encounter (Signed)
Requested medication (s) are due for refill today:  yes  Requested medication (s) are on the active medication list:  yes   Last refill:  03/06/2020  Future visit scheduled: no  Notes to clinic:  Medication not assigned to a protocol, review manually   Requested Prescriptions  Pending Prescriptions Disp Refills   Fluticasone-Umeclidin-Vilant (TRELEGY ELLIPTA) 200-62.5-25 MCG/INH AEPB 60 each 3    Sig: INHALE 1 PUFF INTO THE LUNGS DAILY.      Off-Protocol Failed - 07/10/2020 10:42 AM      Failed - Medication not assigned to a protocol, review manually.      Passed - Valid encounter within last 12 months    Recent Outpatient Visits           Today    [facility] Storm Frisk, MD   3 months ago COPD exacerbation [facility])   [facility] Storm Frisk, MD   8 months ago Panlobular emphysema [facility])   [facility] Storm Frisk, MD   10 months ago Panlobular emphysema [facility])   [facility] Storm Frisk, MD   11 months ago Centrilobular emphysema [facility])   [facility] Storm Frisk, MD

## 2020-07-11 ENCOUNTER — Encounter (HOSPITAL_COMMUNITY)
Admission: RE | Admit: 2020-07-11 | Discharge: 2020-07-11 | Disposition: A | Discharge status: Home / Self Care | Payer: Medicaid Other | Source: Ambulatory Visit | Attending: Critical Care Medicine | Admitting: Critical Care Medicine

## 2020-07-11 ENCOUNTER — Encounter (HOSPITAL_COMMUNITY): Payer: Self-pay

## 2020-07-11 ENCOUNTER — Other Ambulatory Visit: Payer: Self-pay

## 2020-07-11 VITALS — BP 106/70 | HR 96 | Ht 71.5 in | Wt 197.1 lb

## 2020-07-11 DIAGNOSIS — J432 Centrilobular emphysema: Secondary | ICD-10-CM | POA: Insufficient documentation

## 2020-07-11 MED ORDER — TRELEGY ELLIPTA 200-62.5-25 MCG/INH IN AEPB
1.0000 | INHALATION_SPRAY | Freq: Every day | RESPIRATORY_TRACT | 3 refills | Status: DC
  Filled 2020-07-11 – 2020-07-17 (×2): qty 60, 30d supply, fill #0

## 2020-07-11 NOTE — Progress Notes (Deleted)
Pulmonary Individual Treatment Plan  Patient Details  Name: Richard Fuentes MRN: [id number] Date of Birth: 02-06-1970 Referring Provider:   Doristine Devoid Pulmonary Rehab Walk Test from 07/11/2020 in [facility] CARDIAC REHAB  Referring Provider Dr. Shan Levans       Initial Encounter Date:  Flowsheet Row Pulmonary Rehab Walk Test from 07/11/2020 in [facility] CARDIAC REHAB  Date 07/11/20       Visit Diagnosis: Centrilobular emphysema (HCC)  Patient's Home Medications on Admission:   Current Outpatient Medications:    albuterol (VENTOLIN HFA) 108 (90 Base) MCG/ACT inhaler, Inhale 2 puffs into the lungs every 6 (six) hours as needed for wheezing or shortness of breath., Disp: 8.5 g, Rfl: 1   benzonatate (TESSALON) 100 MG capsule, TAKE 1 CAPSULE BY MOUTH EVERY 8 HOURS, Disp: 42 capsule, Rfl: 0   EPINEPHrine 0.3 mg/0.3 mL IJ SOAJ injection, Inject 0.3 mg into the muscle as needed for anaphylaxis., Disp: , Rfl:    Fluticasone-Umeclidin-Vilant (TRELEGY ELLIPTA) 200-62.5-25 MCG/INH AEPB, INHALE 1 PUFF INTO THE LUNGS DAILY., Disp: 60 each, Rfl: 3   HUMIRA PEN 40 MG/0.4ML PNKT, Inject into the skin., Disp: , Rfl:    hydrocortisone 2.5 % cream, Apply  liberally to affected area twice a day, Disp: , Rfl:    ibuprofen (ADVIL) 200 MG tablet, Take 600-800 mg by mouth every 6 (six) hours as needed for mild pain., Disp: , Rfl:    ipratropium-albuterol (DUONEB) 0.5-2.5 (3) MG/3ML SOLN, Inhale 3 mLs into the lungs every 6 (six) hours as needed (sob)., Disp: , Rfl:    levocetirizine (XYZAL) 5 MG tablet, Take 1 tablet once daily as needed for runny nose or itchy eyes., Disp: 34 tablet, Rfl: 5   pantoprazole (PROTONIX) 40 MG tablet, TAKE 1 TABLET (40 MG TOTAL) BY MOUTH DAILY., Disp: 30 tablet, Rfl: 3   traZODone (DESYREL) 50 MG tablet, Take 0.5-1 tablets (25-50 mg total) by mouth at bedtime as needed for sleep., Disp: 30 tablet, Rfl: 3   Vitamin D, Ergocalciferol,  (DRISDOL) 1.25 MG (50000 UNIT) CAPS capsule, TAKE 1 CAPSULE BY MOUTH ONCE A WEEK, Disp: 8 capsule, Rfl: 0  Past Medical History: Past Medical History:  Diagnosis Date   Anxiety    Asthma    COPD (chronic obstructive pulmonary disease) (HCC)    Severe centrilobular and paraseptal bullous emphysema; Gold stage D; FEV1 and DLCO<30%.;  Currently undergoing transplant evaluation   Environmental allergies    Esophagitis    Hx of migraines    OCD (obsessive compulsive disorder)    Pneumothorax on left 03/20/2020   Psoriasis    Urticaria     Tobacco Use: Social History   Tobacco Use  Smoking Status Former   Packs/day: 1.00   Years: 30.00   Pack years: 30.00   Types: Cigarettes   Quit date: 01/19/2018   Years since quitting: 2.4  Smokeless Tobacco Never    Labs: Recent Review Flowsheet Data     Labs for ITP Cardiac and Pulmonary Rehab Latest Ref Rng & Units 04/28/2010 12/07/2018 05/28/2019   Cholestrol 100 - 199 mg/dL - 401(U) -   LDLCALC 0 - 99 mg/dL - 272(Z) -   HDL >36 mg/dL - 50 -   Trlycerides 0 - 149 mg/dL - 644 -   Hemoglobin I3K 4.8 - 5.6 % - 5.8(H) -   PHART 7.350 - 7.450 - - 7.428   PCO2ART 32.0 - 48.0 mmHg - - 39.9   HCO3 20.0 -  28.0 mmol/L - - 25.9   TCO2 0 - 100 mmol/L 27 - -   O2SAT % - - 98.0       Capillary Blood Glucose: No results found for: GLUCAP   Pulmonary Assessment Scores:  Pulmonary Assessment Scores     Row Name 07/11/20 1116         ADL UCSD   ADL Phase Entry     SOB Score total 56           CAT Score     CAT Score 27           mMRC Score     mMRC Score 4            UCSD: Self-administered rating of dyspnea associated with activities of daily living (ADLs) 6-point scale (0 = "not at all" to 5 = "maximal or unable to do because of breathlessness")  Scoring Scores range from 0 to 120.  Minimally important difference is 5 units  CAT: CAT can identify the health impairment of COPD patients and is better correlated with  disease progression.  CAT has a scoring range of zero to 40. The CAT score is classified into four groups of low (less than 10), medium (10 - 20), high (21-30) and very high (31-40) based on the impact level of disease on health status. A CAT score over 10 suggests significant symptoms.  A worsening CAT score could be explained by an exacerbation, poor medication adherence, poor inhaler technique, or progression of COPD or comorbid conditions.  CAT MCID is 2 points  mMRC: mMRC (Modified Medical Research Council) Dyspnea Scale is used to assess the degree of baseline functional disability in patients of respiratory disease due to dyspnea. No minimal important difference is established. A decrease in score of 1 point or greater is considered a positive change.   Pulmonary Function Assessment:  Pulmonary Function Assessment - 07/11/20 0948       Breath   Shortness of Breath Yes;Limiting activity             Exercise Target Goals: Exercise Program Goal: Individual exercise prescription set using results from initial 6 min walk test and THRR while considering  patient's activity barriers and safety.   Exercise Prescription Goal: Initial exercise prescription builds to 30-45 minutes a day of aerobic activity, 2-3 days per week.  Home exercise guidelines will be given to patient during program as part of exercise prescription that the participant will acknowledge.  Activity Barriers & Risk Stratification:  Activity Barriers & Cardiac Risk Stratification - 07/11/20 0943       Activity Barriers & Cardiac Risk Stratification   Activity Barriers Shortness of Breath;Deconditioning;Back Problems   R+L knee meniscus repair            6 Minute Walk:  6 Minute Walk     Row Name 07/11/20 1108         6 Minute Walk   Phase Initial     Distance 1097 feet     Walk Time 6 minutes     # of Rest Breaks 0     MPH 2.08     METS 3.62     RPE 12     Perceived Dyspnea  3     VO2 Peak  12.68     Symptoms Yes (comment)     Comments c/o chest tightness. this is due to lung condition, nothing with heart.     Resting HR 84 bpm  Resting BP 106/70     Resting Oxygen Saturation  97 %     Exercise Oxygen Saturation  during 6 min walk 93 %     Max Ex. HR 108 bpm     Max Ex. BP 114/76     2 Minute Post BP 108/72           Interval HR     1 Minute HR 103     2 Minute HR 102     3 Minute HR 108     4 Minute HR 108     5 Minute HR 106     6 Minute HR 107     2 Minute Post HR 89     Interval Heart Rate? Yes           Interval Oxygen     Interval Oxygen? Yes     Baseline Oxygen Saturation % 97 %     1 Minute Oxygen Saturation % 94 %     1 Minute Liters of Oxygen 0 L     2 Minute Oxygen Saturation % 95 %     2 Minute Liters of Oxygen 0 L     3 Minute Oxygen Saturation % 93 %     3 Minute Liters of Oxygen 0 L     4 Minute Oxygen Saturation % 95 %     4 Minute Liters of Oxygen 0 L     5 Minute Oxygen Saturation % 97 %     5 Minute Liters of Oxygen 0 L     6 Minute Oxygen Saturation % 96 %     6 Minute Liters of Oxygen 0 L     2 Minute Post Oxygen Saturation % 96 %     2 Minute Post Liters of Oxygen 0 L             Oxygen Initial Assessment:  Oxygen Initial Assessment - 07/11/20 0947       Home Oxygen   Home Oxygen Device Portable Concentrator;Home Concentrator    Sleep Oxygen Prescription Continuous    Liters per minute 1    Home Exercise Oxygen Prescription None    Home Resting Oxygen Prescription None    Compliance with Home Oxygen Use Yes      Initial 6 min Walk   Oxygen Used None      Program Oxygen Prescription   Program Oxygen Prescription None      Intervention   Short Term Goals To learn and understand importance of monitoring SPO2 with pulse oximeter and demonstrate accurate use of the pulse oximeter.;To learn and understand importance of maintaining oxygen saturations>88%;To learn and demonstrate proper pursed lip breathing techniques or  other breathing techniques. ;To learn and demonstrate proper use of respiratory medications    Long  Term Goals Verbalizes importance of monitoring SPO2 with pulse oximeter and return demonstration;Exhibits proper breathing techniques, such as pursed lip breathing or other method taught during program session;Maintenance of O2 saturations>88%;Compliance with respiratory medication;Demonstrates proper use of MDI's             Oxygen Re-Evaluation:   Oxygen Discharge (Final Oxygen Re-Evaluation):   Initial Exercise Prescription:  Initial Exercise Prescription - 07/11/20 1100       Date of Initial Exercise RX and Referring Provider   Date 07/11/20    Referring Provider Dr. Shan LevansPatrick Wright    Expected Discharge Date 09/11/20      NuStep   Level 1  SPM 80    Minutes 15      Track   Minutes 15      Prescription Details   Frequency (times per week) 2    Duration Progress to 30 minutes of continuous aerobic without signs/symptoms of physical distress      Intensity   THRR 40-80% of Max Heartrate 68-135    Ratings of Perceived Exertion 11-13    Perceived Dyspnea 0-4      Progression   Progression Continue to progress workloads to maintain intensity without signs/symptoms of physical distress.      Resistance Training   Training Prescription Yes    Weight Blue bands    Reps 10-15             Perform Capillary Blood Glucose checks as needed.  Exercise Prescription Changes:   Exercise Comments:   Exercise Goals and Review:   Exercise Goals     Row Name 07/11/20 1009             Exercise Goals   Increase Physical Activity Yes       Intervention Provide advice, education, support and counseling about physical activity/exercise needs.;Develop an individualized exercise prescription for aerobic and resistive training based on initial evaluation findings, risk stratification, comorbidities and participant's personal goals.       Expected Outcomes Short  Term: Attend rehab on a regular basis to increase amount of physical activity.;Long Term: Add in home exercise to make exercise part of routine and to increase amount of physical activity.;Long Term: Exercising regularly at least 3-5 days a week.       Increase Strength and Stamina Yes       Intervention Provide advice, education, support and counseling about physical activity/exercise needs.;Develop an individualized exercise prescription for aerobic and resistive training based on initial evaluation findings, risk stratification, comorbidities and participant's personal goals.       Expected Outcomes Short Term: Increase workloads from initial exercise prescription for resistance, speed, and METs.;Short Term: Perform resistance training exercises routinely during rehab and add in resistance training at home;Long Term: Improve cardiorespiratory fitness, muscular endurance and strength as measured by increased METs and functional capacity ( )       Able to understand and use rate of perceived exertion (RPE) scale Yes       Intervention Provide education and explanation on how to use RPE scale       Expected Outcomes Short Term: Able to use RPE daily in rehab to express subjective intensity level;Long Term:  Able to use RPE to guide intensity level when exercising independently       Able to understand and use Dyspnea scale Yes       Intervention Provide education and explanation on how to use Dyspnea scale       Expected Outcomes Short Term: Able to use Dyspnea scale daily in rehab to express subjective sense of shortness of breath during exertion;Long Term: Able to use Dyspnea scale to guide intensity level when exercising independently       Knowledge and understanding of Target Heart Rate Range (THRR) Yes       Intervention Provide education and explanation of THRR including how the numbers were predicted and where they are located for reference       Expected Outcomes Short Term: Able to  state/look up THRR;Long Term: Able to use THRR to govern intensity when exercising independently;Short Term: Able to use daily as guideline for intensity in rehab  Understanding of Exercise Prescription Yes       Intervention Provide education, explanation, and written materials on patient's individual exercise prescription       Expected Outcomes Short Term: Able to explain program exercise prescription;Long Term: Able to explain home exercise prescription to exercise independently                Exercise Goals Re-Evaluation :   Discharge Exercise Prescription (Final Exercise Prescription Changes):   Nutrition:  Target Goals: Understanding of nutrition guidelines, daily intake of sodium 1500mg , cholesterol 200mg , calories 30% from fat and 7% or less from saturated fats, daily to have 5 or more servings of fruits and vegetables.  Biometrics:  Pre Biometrics - 07/11/20 1116       Pre Biometrics   Grip Strength 50 kg              Nutrition Therapy Plan and Nutrition Goals:   Nutrition Assessments:  MEDIFICTS Score Key: ?70 Need to make dietary changes  40-70 Heart Healthy Diet ? 40 Therapeutic Level Cholesterol Diet   Picture Your Plate Scores: <65 Unhealthy dietary pattern with much room for improvement. 41-50 Dietary pattern unlikely to meet recommendations for good health and room for improvement. 51-60 More healthful dietary pattern, with some room for improvement.  >60 Healthy dietary pattern, although there may be some specific behaviors that could be improved.    Nutrition Goals Re-Evaluation:   Nutrition Goals Discharge (Final Nutrition Goals Re-Evaluation):   Psychosocial: Target Goals: Acknowledge presence or absence of significant depression and/or stress, maximize coping skills, provide positive support system. Participant is able to verbalize types and ability to use techniques and skills needed for reducing stress and  depression.  Initial Review & Psychosocial Screening:  Initial Psych Review & Screening - 07/11/20 1004       Initial Review   Comments He is unable to work due to his lung illness and condition. 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      Barriers   Psychosocial barriers to participate in program The patient should benefit from training in stress management and relaxation.;Psychosocial barriers identified (see note)      Screening Interventions   Interventions Encouraged to exercise    Expected Outcomes Long Term Goal: Stressors or current issues are controlled or eliminated.;Long Term goal: The participant improves quality of Life and PHQ9 Scores as seen by post scores and/or verbalization of changes             Quality of Life Scores:  Scores of 19 and below usually indicate a poorer quality of life in these areas.  A difference of  2-3 points is a clinically meaningful difference.  A difference of 2-3 points in the total score of the Quality of Life Index has been associated with significant improvement in overall quality of life, self-image, physical symptoms, and general health in studies assessing change in quality of life.  PHQ-9: Recent Review Flowsheet Data     Depression screen [facility] 2/9 07/11/2020 04/08/2020 10/24/2019 10/12/2019 10/11/2019   Decreased Interest 0 2 2 - 2   Down, Depressed, Hopeless - 2   PHQ - 2 Score - 4   Altered sleeping - 3   Tired, decreased energy - 3   Change in appetite 0 3 2 - 3   Feeling bad or failure about yourself  -  3   Trouble concentrating - 3   Moving slowly or fidgety/restless 0 2 2 - 3   Suicidal thoughts 0 0 0 - 0   PHQ-9 Score - 22 21 - 22   Difficult doing work/chores Somewhat difficult - - Very difficult Somewhat difficult      Interpretation of Total Score  Total Score Depression Severity:  1-4 = Minimal depression, 5-9 = Mild depression, 10-14 =  Moderate depression, 15-19 = Moderately severe depression, 20-27 = Severe depression   Psychosocial Evaluation and Intervention:  Psychosocial Evaluation - 07/11/20 1103       Psychosocial Evaluation & Interventions   Interventions Encouraged to exercise with the program and follow exercise prescription    Comments pt states that his stress level has decreased some since last time he was here. He is still unemployed and is unable to do things he enjoys due to his illness.    Expected Outcomes For pt to learn healthy coping mechanism for stress and depression and to improve quality of life as shown through PHQ9 scores.    Continue Psychosocial Services  Follow up required by staff             Psychosocial Re-Evaluation:   Psychosocial Discharge (Final Psychosocial Re-Evaluation):   Education: Education Goals: Education classes will be provided on a weekly basis, covering required topics. Participant will state understanding/return demonstration of topics presented.  Learning Barriers/Preferences:  Learning Barriers/Preferences - 07/11/20 0954       Learning Barriers/Preferences   Learning Barriers Sight   wears glasses   Learning Preferences Computer/Internet;Individual Instruction;Video             Education Topics: Risk Factor Reduction:  -Group instruction that is supported by a PowerPoint presentation. Instructor discusses the definition of a risk factor, different risk factors for pulmonary disease, and how the heart and lungs work together.     Nutrition for Pulmonary Patient:  -Group instruction provided by PowerPoint slides, verbal discussion, and written materials to support subject matter. The instructor gives an explanation and review of healthy diet recommendations, which includes a discussion on weight management, recommendations for fruit and vegetable consumption, as well as protein, fluid, caffeine, fiber, sodium, sugar, and alcohol. Tips for eating when  patients are short of breath are discussed.   Pursed Lip Breathing:  -Group instruction that is supported by demonstration and informational handouts. Instructor discusses the benefits of pursed lip and diaphragmatic breathing and detailed demonstration on how to preform both.   Flowsheet Row PULMONARY REHAB OTHER RESPIRATORY from 11/15/2019 in [facility] CARDIAC REHAB  Date 11/15/19  Educator Handout How to beat a sedentary lifestyle       Oxygen Safety:  -Group instruction provided by PowerPoint, verbal discussion, and written material to support subject matter. There is an overview of "What is Oxygen" and "Why do we need it".  Instructor also reviews how to create a safe environment for oxygen use, the importance of using oxygen as prescribed, and the risks of noncompliance. There is a brief discussion on traveling with oxygen and resources the patient may utilize.   Oxygen Equipment:  -Group instruction provided by [facility] Staff utilizing handouts, written materials, and equipment demonstrations.   Signs and Symptoms:  -Group instruction provided by written material and verbal discussion to support subject matter. Warning signs and symptoms of infection, stroke, and heart attack are reviewed and when to call the physician/911 reinforced. Tips for preventing the spread of infection  discussed.   Advanced Directives:  -Group instruction provided by verbal instruction and written material to support subject matter. Instructor reviews Advanced Directive laws and proper instruction for filling out document.   Pulmonary Video:  -Group video education that reviews the importance of medication and oxygen compliance, exercise, good nutrition, pulmonary hygiene, and pursed lip and diaphragmatic breathing for the pulmonary patient.   Exercise for the Pulmonary Patient:  -Group instruction that is supported by a PowerPoint presentation. Instructor discusses benefits of  exercise, core components of exercise, frequency, duration, and intensity of an exercise routine, importance of utilizing pulse oximetry during exercise, safety while exercising, and options of places to exercise outside of rehab.     Pulmonary Medications:  -Verbally interactive group education provided by instructor with focus on inhaled medications and proper administration.   Anatomy and Physiology of the Respiratory System and Intimacy:  -Group instruction provided by PowerPoint, verbal discussion, and written material to support subject matter. Instructor reviews respiratory cycle and anatomical components of the respiratory system and their functions. Instructor also reviews differences in obstructive and restrictive respiratory diseases with examples of each. Intimacy, Sex, and Sexuality differences are reviewed with a discussion on how relationships can change when diagnosed with pulmonary disease. Common sexual concerns are reviewed.   MD DAY -A group question and answer session with a medical doctor that allows participants to ask questions that relate to their pulmonary disease state.   OTHER EDUCATION -Group or individual verbal, written, or video instructions that support the educational goals of the pulmonary rehab program.   Holiday Eating Survival Tips:  -Group instruction provided by PowerPoint slides, verbal discussion, and written materials to support subject matter. The instructor gives patients tips, tricks, and techniques to help them not only survive but enjoy the holidays despite the onslaught of food that accompanies the holidays.   Knowledge Questionnaire Score:  Knowledge Questionnaire Score - 07/11/20 1120       Knowledge Questionnaire Score   Pre Score 12/18             Core Components/Risk Factors/Patient Goals at Admission:  Personal Goals and Risk Factors at Admission - 07/11/20 0954       Core Components/Risk Factors/Patient Goals on Admission     Weight Management Weight Loss;Yes    Intervention Weight Management: Develop a combined nutrition and exercise program designed to reach desired caloric intake, while maintaining appropriate intake of nutrient and fiber, sodium and fats, and appropriate energy expenditure required for the weight goal.;Weight Management/Obesity: Establish reasonable short term and long term weight goals.;Obesity: Provide education and appropriate resources to help participant work on and attain dietary goals.    Expected Outcomes Long Term: Adherence to nutrition and physical activity/exercise program aimed toward attainment of established weight goal;Weight Loss: Understanding of general recommendations for a balanced deficit meal plan, which promotes 1-2 lb weight loss per week and includes a negative energy balance of [phone] kcal/d    Improve shortness of breath with ADL's Yes    Intervention Provide education, individualized exercise plan and daily activity instruction to help decrease symptoms of SOB with activities of daily living.    Expected Outcomes Short Term: Improve cardiorespiratory fitness to achieve a reduction of symptoms when performing ADLs;Long Term: Be able to perform more ADLs without symptoms or delay the onset of symptoms             Core Components/Risk Factors/Patient Goals Review:    Core Components/Risk Factors/Patient Goals at Discharge (Final Review):  ITP Comments:   Comments:

## 2020-07-11 NOTE — Progress Notes (Signed)
Pulmonary Rehab Orientation Physical Assessment Note  Physical assessment reveals  Pt is alert and oriented x 3.  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 Reports mostly non-productive cough occasional productive. Bowel sounds not present.  Pt denies abdominal discomfort, nausea, vomiting or diarrhea. Grip strength equal, strong. Distal pulses palpable with no swelling to lower extremities. Geary who is known to pulmonary rehab staff is doing very well and he is pleased with his progress he has made thus far and he is looking forward to pulmonary rehab. [name], BSN Cardiac and Emergency planning/management officer

## 2020-07-11 NOTE — Progress Notes (Signed)
Pulmonary Individual Treatment Plan  Patient Details  Name: Richard Fuentes MRN: [id number] Date of Birth: 06/21/1970 Referring Provider:   Flowsheet Row Pulmonary Rehab Walk Test from 07/11/2020 in [facility]  Referring Provider Dr. Patrick Wright       Initial Encounter Date:  Flowsheet Row Pulmonary Rehab Walk Test from 07/11/2020 in [facility]  Date 07/11/20       Visit Diagnosis: Centrilobular emphysema (HCC)  Patient's Home Medications on Admission:   Current Outpatient Medications:    albuterol (VENTOLIN HFA) 108 (90 Base) MCG/ACT inhaler, Inhale 2 puffs into the lungs every 6 (six) hours as needed for wheezing or shortness of breath., Disp: 8.5 g, Rfl: 1   benzonatate (TESSALON) 100 MG capsule, TAKE 1 CAPSULE BY MOUTH EVERY 8 HOURS, Disp: 42 capsule, Rfl: 0   EPINEPHrine 0.3 mg/0.3 mL IJ SOAJ injection, Inject 0.3 mg into the muscle as needed for anaphylaxis., Disp: , Rfl:    Fluticasone-Umeclidin-Vilant (TRELEGY ELLIPTA) 200-62.5-25 MCG/INH AEPB, INHALE 1 PUFF INTO THE LUNGS DAILY., Disp: 60 each, Rfl: 3   HUMIRA PEN 40 MG/0.4ML PNKT, Inject into the skin., Disp: , Rfl:    hydrocortisone 2.5 % cream, Apply  liberally to affected area twice a day, Disp: , Rfl:    ibuprofen (ADVIL) 200 MG tablet, Take 600-800 mg by mouth every 6 (six) hours as needed for mild pain., Disp: , Rfl:    ipratropium-albuterol (DUONEB) 0.5-2.5 (3) MG/3ML SOLN, Inhale 3 mLs into the lungs every 6 (six) hours as needed (sob)., Disp: , Rfl:    levocetirizine (XYZAL) 5 MG tablet, Take 1 tablet once daily as needed for runny nose or itchy eyes., Disp: 34 tablet, Rfl: 5   pantoprazole (PROTONIX) 40 MG tablet, TAKE 1 TABLET (40 MG TOTAL) BY MOUTH DAILY., Disp: 30 tablet, Rfl: 3   traZODone (DESYREL) 50 MG tablet, Take 0.5-1 tablets (25-50 mg total) by mouth at bedtime as needed for sleep., Disp: 30 tablet, Rfl: 3   Vitamin D, Ergocalciferol,  (DRISDOL) 1.25 MG (50000 UNIT) CAPS capsule, TAKE 1 CAPSULE BY MOUTH ONCE A WEEK, Disp: 8 capsule, Rfl: 0  Past Medical History: Past Medical History:  Diagnosis Date   Anxiety    Asthma    COPD (chronic obstructive pulmonary disease) (HCC)    Severe centrilobular and paraseptal bullous emphysema; Gold stage D; FEV1 and DLCO<30%.;  Currently undergoing transplant evaluation   Environmental allergies    Esophagitis    Hx of migraines    OCD (obsessive compulsive disorder)    Pneumothorax on left 03/20/2020   Psoriasis    Urticaria     Tobacco Use: Social History   Tobacco Use  Smoking Status Former   Packs/day: 1.00   Years: 30.00   Pack years: 30.00   Types: Cigarettes   Quit date: 01/19/2018   Years since quitting: 2.4  Smokeless Tobacco Never    Labs: Recent Review Flowsheet Data     Labs for ITP Cardiac and Pulmonary Rehab Latest Ref Rng & Units 04/28/2010 12/07/2018 05/28/2019   Cholestrol 100 - 199 mg/dL - 240(H) -   LDLCALC 0 - 99 mg/dL - 165(H) -   HDL >39 mg/dL - 50 -   Trlycerides 0 - 149 mg/dL - 139 -   Hemoglobin A1c 4.8 - 5.6 % - 5.8(H) -   PHART 7.350 - 7.450 - - 7.428   PCO2ART 32.0 - 48.0 mmHg - - 39.9   HCO3 20.0 -   28.0 mmol/L - - 25.9   TCO2 0 - 100 mmol/L 27 - -   O2SAT % - - 98.0       Capillary Blood Glucose: No results found for: GLUCAP   Pulmonary Assessment Scores:  Pulmonary Assessment Scores     Row Name 07/11/20 1116         ADL UCSD   ADL Phase Entry     SOB Score total 56           CAT Score     CAT Score 27           mMRC Score     mMRC Score 4            UCSD: Self-administered rating of dyspnea associated with activities of daily living (ADLs) 6-point scale (0 = "not at all" to 5 = "maximal or unable to do because of breathlessness")  Scoring Scores range from 0 to 120.  Minimally important difference is 5 units  CAT: CAT can identify the health impairment of COPD patients and is better correlated with  disease progression.  CAT has a scoring range of zero to 40. The CAT score is classified into four groups of low (less than 10), medium (10 - 20), high (21-30) and very high (31-40) based on the impact level of disease on health status. A CAT score over 10 suggests significant symptoms.  A worsening CAT score could be explained by an exacerbation, poor medication adherence, poor inhaler technique, or progression of COPD or comorbid conditions.  CAT MCID is 2 points  mMRC: mMRC (Modified Medical Research Council) Dyspnea Scale is used to assess the degree of baseline functional disability in patients of respiratory disease due to dyspnea. No minimal important difference is established. A decrease in score of 1 point or greater is considered a positive change.   Pulmonary Function Assessment:  Pulmonary Function Assessment - 07/11/20 0948       Breath   Shortness of Breath Yes;Limiting activity             Exercise Target Goals: Exercise Program Goal: Individual exercise prescription set using results from initial 6 min walk test and THRR while considering  patient's activity barriers and safety.   Exercise Prescription Goal: Initial exercise prescription builds to 30-45 minutes a day of aerobic activity, 2-3 days per week.  Home exercise guidelines will be given to patient during program as part of exercise prescription that the participant will acknowledge.  Activity Barriers & Risk Stratification:  Activity Barriers & Cardiac Risk Stratification - 07/11/20 0943       Activity Barriers & Cardiac Risk Stratification   Activity Barriers Shortness of Breath;Deconditioning;Back Problems   R+L knee meniscus repair            6 Minute Walk:  6 Minute Walk     Row Name 07/11/20 1108         6 Minute Walk   Phase Initial     Distance 1097 feet     Walk Time 6 minutes     # of Rest Breaks 0     MPH 2.08     METS 3.62     RPE 12     Perceived Dyspnea  3     VO2 Peak  12.68     Symptoms Yes (comment)     Comments c/o chest tightness. this is due to lung condition, nothing with heart.     Resting HR 84 bpm       Resting BP 106/70     Resting Oxygen Saturation  97 %     Exercise Oxygen Saturation  during 6 min walk 93 %     Max Ex. HR 108 bpm     Max Ex. BP 114/76     2 Minute Post BP 108/72           Interval HR     1 Minute HR 103     2 Minute HR 102     3 Minute HR 108     4 Minute HR 108     5 Minute HR 106     6 Minute HR 107     2 Minute Post HR 89     Interval Heart Rate? Yes           Interval Oxygen     Interval Oxygen? Yes     Baseline Oxygen Saturation % 97 %     1 Minute Oxygen Saturation % 94 %     1 Minute Liters of Oxygen 0 L     2 Minute Oxygen Saturation % 95 %     2 Minute Liters of Oxygen 0 L     3 Minute Oxygen Saturation % 93 %     3 Minute Liters of Oxygen 0 L     4 Minute Oxygen Saturation % 95 %     4 Minute Liters of Oxygen 0 L     5 Minute Oxygen Saturation % 97 %     5 Minute Liters of Oxygen 0 L     6 Minute Oxygen Saturation % 96 %     6 Minute Liters of Oxygen 0 L     2 Minute Post Oxygen Saturation % 96 %     2 Minute Post Liters of Oxygen 0 L             Oxygen Initial Assessment:  Oxygen Initial Assessment - 07/11/20 0947       Home Oxygen   Home Oxygen Device Portable Concentrator;Home Concentrator    Sleep Oxygen Prescription Continuous    Liters per minute 1    Home Exercise Oxygen Prescription None    Home Resting Oxygen Prescription None    Compliance with Home Oxygen Use Yes      Initial 6 min Walk   Oxygen Used None      Program Oxygen Prescription   Program Oxygen Prescription None      Intervention   Short Term Goals To learn and understand importance of monitoring SPO2 with pulse oximeter and demonstrate accurate use of the pulse oximeter.;To learn and understand importance of maintaining oxygen saturations>88%;To learn and demonstrate proper pursed lip breathing techniques or  other breathing techniques. ;To learn and demonstrate proper use of respiratory medications    Long  Term Goals Verbalizes importance of monitoring SPO2 with pulse oximeter and return demonstration;Exhibits proper breathing techniques, such as pursed lip breathing or other method taught during program session;Maintenance of O2 saturations>88%;Compliance with respiratory medication;Demonstrates proper use of MDI's             Oxygen Re-Evaluation:   Oxygen Discharge (Final Oxygen Re-Evaluation):   Initial Exercise Prescription:  Initial Exercise Prescription - 07/11/20 1100       Date of Initial Exercise RX and Referring Provider   Date 07/11/20    Referring Provider Dr. Patrick Wright    Expected Discharge Date 09/11/20      NuStep   Level 1      SPM 80    Minutes 15      Track   Minutes 15      Prescription Details   Frequency (times per week) 2    Duration Progress to 30 minutes of continuous aerobic without signs/symptoms of physical distress      Intensity   THRR 40-80% of Max Heartrate 68-135    Ratings of Perceived Exertion 11-13    Perceived Dyspnea 0-4      Progression   Progression Continue to progress workloads to maintain intensity without signs/symptoms of physical distress.      Resistance Training   Training Prescription Yes    Weight Blue bands    Reps 10-15             Perform Capillary Blood Glucose checks as needed.  Exercise Prescription Changes:   Exercise Comments:   Exercise Goals and Review:   Exercise Goals     Row Name 07/11/20 1009             Exercise Goals   Increase Physical Activity Yes       Intervention Provide advice, education, support and counseling about physical activity/exercise needs.;Develop an individualized exercise prescription for aerobic and resistive training based on initial evaluation findings, risk stratification, comorbidities and participant's personal goals.       Expected Outcomes Short  Term: Attend rehab on a regular basis to increase amount of physical activity.;Long Term: Add in home exercise to make exercise part of routine and to increase amount of physical activity.;Long Term: Exercising regularly at least 3-5 days a week.       Increase Strength and Stamina Yes       Intervention Provide advice, education, support and counseling about physical activity/exercise needs.;Develop an individualized exercise prescription for aerobic and resistive training based on initial evaluation findings, risk stratification, comorbidities and participant's personal goals.       Expected Outcomes Short Term: Increase workloads from initial exercise prescription for resistance, speed, and METs.;Short Term: Perform resistance training exercises routinely during rehab and add in resistance training at home;Long Term: Improve cardiorespiratory fitness, muscular endurance and strength as measured by increased METs and functional capacity (6MWT)       Able to understand and use rate of perceived exertion (RPE) scale Yes       Intervention Provide education and explanation on how to use RPE scale       Expected Outcomes Short Term: Able to use RPE daily in rehab to express subjective intensity level;Long Term:  Able to use RPE to guide intensity level when exercising independently       Able to understand and use Dyspnea scale Yes       Intervention Provide education and explanation on how to use Dyspnea scale       Expected Outcomes Short Term: Able to use Dyspnea scale daily in rehab to express subjective sense of shortness of breath during exertion;Long Term: Able to use Dyspnea scale to guide intensity level when exercising independently       Knowledge and understanding of Target Heart Rate Range (THRR) Yes       Intervention Provide education and explanation of THRR including how the numbers were predicted and where they are located for reference       Expected Outcomes Short Term: Able to  state/look up THRR;Long Term: Able to use THRR to govern intensity when exercising independently;Short Term: Able to use daily as guideline for intensity in rehab         Understanding of Exercise Prescription Yes       Intervention Provide education, explanation, and written materials on patient's individual exercise prescription       Expected Outcomes Short Term: Able to explain program exercise prescription;Long Term: Able to explain home exercise prescription to exercise independently                Exercise Goals Re-Evaluation :   Discharge Exercise Prescription (Final Exercise Prescription Changes):   Nutrition:  Target Goals: Understanding of nutrition guidelines, daily intake of sodium <1500mg, cholesterol <200mg, calories 30% from fat and 7% or less from saturated fats, daily to have 5 or more servings of fruits and vegetables.  Biometrics:  Pre Biometrics - 07/11/20 1116       Pre Biometrics   Grip Strength 50 kg              Nutrition Therapy Plan and Nutrition Goals:   Nutrition Assessments:  MEDIFICTS Score Key: ?70 Need to make dietary changes  40-70 Heart Healthy Diet ? 40 Therapeutic Level Cholesterol Diet   Picture Your Plate Scores: <40 Unhealthy dietary pattern with much room for improvement. 41-50 Dietary pattern unlikely to meet recommendations for good health and room for improvement. 51-60 More healthful dietary pattern, with some room for improvement.  >60 Healthy dietary pattern, although there may be some specific behaviors that could be improved.    Nutrition Goals Re-Evaluation:   Nutrition Goals Discharge (Final Nutrition Goals Re-Evaluation):   Psychosocial: Target Goals: Acknowledge presence or absence of significant depression and/or stress, maximize coping skills, provide positive support system. Participant is able to verbalize types and ability to use techniques and skills needed for reducing stress and  depression.  Initial Review & Psychosocial Screening:  Initial Psych Review & Screening - 07/11/20 1004       Initial Review   Comments He is unable to work due to his lung illness and condition. 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      Barriers   Psychosocial barriers to participate in program The patient should benefit from training in stress management and relaxation.;Psychosocial barriers identified (see note)      Screening Interventions   Interventions Encouraged to exercise    Expected Outcomes Long Term Goal: Stressors or current issues are controlled or eliminated.;Long Term goal: The participant improves quality of Life and PHQ9 Scores as seen by post scores and/or verbalization of changes             Quality of Life Scores:  Scores of 19 and below usually indicate a poorer quality of life in these areas.  A difference of  2-3 points is a clinically meaningful difference.  A difference of 2-3 points in the total score of the Quality of Life Index has been associated with significant improvement in overall quality of life, self-image, physical symptoms, and general health in studies assessing change in quality of life.  PHQ-9: Recent Review Flowsheet Data     Depression screen PHQ 2/9 07/11/2020 04/08/2020 10/24/2019 10/12/2019 10/11/2019   Decreased Interest 0 2 2 - 2   Down, Depressed, Hopeless 1 3 3 - 2   PHQ - 2 Score 1 5 5 - 4   Altered sleeping 3 3 3 - 3   Tired, decreased energy 3 3 3 - 3   Change in appetite 0 3 2 - 3   Feeling bad or failure about yourself  1 3 3 -   3   Trouble concentrating 3 3 3 - 3   Moving slowly or fidgety/restless 0 2 2 - 3   Suicidal thoughts 0 0 0 - 0   PHQ-9 Score - 22 21 - 22   Difficult doing work/chores Somewhat difficult - - Very difficult Somewhat difficult      Interpretation of Total Score  Total Score Depression Severity:  1-4 = Minimal depression, 5-9 = Mild depression, 10-14 =  Moderate depression, 15-19 = Moderately severe depression, 20-27 = Severe depression   Psychosocial Evaluation and Intervention:  Psychosocial Evaluation - 07/11/20 1103       Psychosocial Evaluation & Interventions   Interventions Encouraged to exercise with the program and follow exercise prescription    Comments pt states that his stress level has decreased some since last time he was here. He is still unemployed and is unable to do things he enjoys due to his illness.    Expected Outcomes For pt to learn healthy coping mechanism for stress and depression and to improve quality of life as shown through PHQ9 scores.    Continue Psychosocial Services  Follow up required by staff             Psychosocial Re-Evaluation:   Psychosocial Discharge (Final Psychosocial Re-Evaluation):   Education: Education Goals: Education classes will be provided on a weekly basis, covering required topics. Participant will state understanding/return demonstration of topics presented.  Learning Barriers/Preferences:  Learning Barriers/Preferences - 07/11/20 0954       Learning Barriers/Preferences   Learning Barriers Sight   wears glasses   Learning Preferences Computer/Internet;Individual Instruction;Video             Education Topics: Risk Factor Reduction:  -Group instruction that is supported by a PowerPoint presentation. Instructor discusses the definition of a risk factor, different risk factors for pulmonary disease, and how the heart and lungs work together.     Nutrition for Pulmonary Patient:  -Group instruction provided by PowerPoint slides, verbal discussion, and written materials to support subject matter. The instructor gives an explanation and review of healthy diet recommendations, which includes a discussion on weight management, recommendations for fruit and vegetable consumption, as well as protein, fluid, caffeine, fiber, sodium, sugar, and alcohol. Tips for eating when  patients are short of breath are discussed.   Pursed Lip Breathing:  -Group instruction that is supported by demonstration and informational handouts. Instructor discusses the benefits of pursed lip and diaphragmatic breathing and detailed demonstration on how to preform both.   Flowsheet Row PULMONARY REHAB OTHER RESPIRATORY from 11/15/2019 in [facility]  Date 11/15/19  Educator Handout How to beat a sedentary lifestyle       Oxygen Safety:  -Group instruction provided by PowerPoint, verbal discussion, and written material to support subject matter. There is an overview of "What is Oxygen" and "Why do we need it".  Instructor also reviews how to create a safe environment for oxygen use, the importance of using oxygen as prescribed, and the risks of noncompliance. There is a brief discussion on traveling with oxygen and resources the patient may utilize.   Oxygen Equipment:  -Group instruction provided by Home Health Staff utilizing handouts, written materials, and equipment demonstrations.   Signs and Symptoms:  -Group instruction provided by written material and verbal discussion to support subject matter. Warning signs and symptoms of infection, stroke, and heart attack are reviewed and when to call the physician/911 reinforced. Tips for preventing the spread of infection   discussed.   Advanced Directives:  -Group instruction provided by verbal instruction and written material to support subject matter. Instructor reviews Advanced Directive laws and proper instruction for filling out document.   Pulmonary Video:  -Group video education that reviews the importance of medication and oxygen compliance, exercise, good nutrition, pulmonary hygiene, and pursed lip and diaphragmatic breathing for the pulmonary patient.   Exercise for the Pulmonary Patient:  -Group instruction that is supported by a PowerPoint presentation. Instructor discusses benefits of  exercise, core components of exercise, frequency, duration, and intensity of an exercise routine, importance of utilizing pulse oximetry during exercise, safety while exercising, and options of places to exercise outside of rehab.     Pulmonary Medications:  -Verbally interactive group education provided by instructor with focus on inhaled medications and proper administration.   Anatomy and Physiology of the Respiratory System and Intimacy:  -Group instruction provided by PowerPoint, verbal discussion, and written material to support subject matter. Instructor reviews respiratory cycle and anatomical components of the respiratory system and their functions. Instructor also reviews differences in obstructive and restrictive respiratory diseases with examples of each. Intimacy, Sex, and Sexuality differences are reviewed with a discussion on how relationships can change when diagnosed with pulmonary disease. Common sexual concerns are reviewed.   MD DAY -A group question and answer session with a medical doctor that allows participants to ask questions that relate to their pulmonary disease state.   OTHER EDUCATION -Group or individual verbal, written, or video instructions that support the educational goals of the pulmonary rehab program.   Holiday Eating Survival Tips:  -Group instruction provided by PowerPoint slides, verbal discussion, and written materials to support subject matter. The instructor gives patients tips, tricks, and techniques to help them not only survive but enjoy the holidays despite the onslaught of food that accompanies the holidays.   Knowledge Questionnaire Score:  Knowledge Questionnaire Score - 07/11/20 1120       Knowledge Questionnaire Score   Pre Score 12/18             Core Components/Risk Factors/Patient Goals at Admission:  Personal Goals and Risk Factors at Admission - 07/11/20 0954       Core Components/Risk Factors/Patient Goals on Admission     Weight Management Weight Loss;Yes    Intervention Weight Management: Develop a combined nutrition and exercise program designed to reach desired caloric intake, while maintaining appropriate intake of nutrient and fiber, sodium and fats, and appropriate energy expenditure required for the weight goal.;Weight Management/Obesity: Establish reasonable short term and long term weight goals.;Obesity: Provide education and appropriate resources to help participant work on and attain dietary goals.    Expected Outcomes Long Term: Adherence to nutrition and physical activity/exercise program aimed toward attainment of established weight goal;Weight Loss: Understanding of general recommendations for a balanced deficit meal plan, which promotes 1-2 lb weight loss per week and includes a negative energy balance of 500-1000 kcal/d    Improve shortness of breath with ADL's Yes    Intervention Provide education, individualized exercise plan and daily activity instruction to help decrease symptoms of SOB with activities of daily living.    Expected Outcomes Short Term: Improve cardiorespiratory fitness to achieve a reduction of symptoms when performing ADLs;Long Term: Be able to perform more ADLs without symptoms or delay the onset of symptoms             Core Components/Risk Factors/Patient Goals Review:    Core Components/Risk Factors/Patient Goals at Discharge (Final Review):      ITP Comments:   Comments:   

## 2020-07-11 NOTE — Progress Notes (Signed)
Richard Fuentes 50 y.o. male Pulmonary Rehab Orientation Note This patient who was referred to Pulmonary rehab by Dr. Shan Levans with the diagnosis of Centrilobular Emphysema arrived today in Cardiac and Pulmonary Rehab. He arrived ambulatory with steady gait. He does not carry portable oxygen. Per pt, he uses oxygen at night and occasionally during the day with exertion, if he needs it. He states he has not needed to use his supplemental oxygen very often recently. Pt's sats were 97% on RA when he arrived in the department. Color good, skin warm and dry. Patient is oriented to time and place. Patient's medical history, psychosocial health, and medications reviewed. Psychosocial assessment reveals pt lives with an adult companion. Pt is currently unemployed. 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 Pt reports his stress level is moderate. Areas of stress/anxiety include Health.  Pt does exhibits sign of depression. Signs of depression include anxiety and hopelessness and difficulty falling asleep and difficulty maintaining sleep. PHQ2/9 score 1/10. Pt shows fair  coping skills with positive outlook. Richard Fuentes was offered emotional support and reassurance. Pt does have a history of depression and anxiety and has access to medication but does not take it regularly. He just recently got put on medication to help with sleep and will start this today. Will continue to monitor and evaluate progress toward psychosocial goal(s) of developing healthy coping mechanisms to deal with stressors and to also improve quality of life as shown by improvement in PHQ9 scores. Patient reports he does take medications as prescribed. Patient states he follows a Regular diet. The patient reports no specific efforts to gain or lose weight. Patient's weight will be monitored closely. Demonstration and practice of PLB using pulse oximeter. Patient able to return demonstration satisfactorily. Safety and hand hygiene in the  exercise area reviewed with patient. Patient voices understanding of the information reviewed. Department expectations discussed with patient and achievable goals were set. The patient shows enthusiasm about attending the program and we look forward to working with this nice gentleman. The patient completed a 6 min walk test today and is scheduled to begin exercise on 07/15/20.  45 minutes was spent on a variety of activities such as assessment of the patient, obtaining baseline data including height, weight, BMI, and grip strength, verifying medical history, allergies, and current medications, and teaching patient strategies for performing tasks with less respiratory effort with emphasis on pursed lip breathing.  [name] MS, ACSM CEP

## 2020-07-14 ENCOUNTER — Other Ambulatory Visit: Payer: Self-pay | Admitting: Critical Care Medicine

## 2020-07-15 ENCOUNTER — Other Ambulatory Visit: Payer: Self-pay

## 2020-07-15 ENCOUNTER — Encounter (HOSPITAL_COMMUNITY)
Admission: RE | Admit: 2020-07-15 | Discharge: 2020-07-15 | Disposition: A | Discharge status: Home / Self Care | Payer: Medicaid Other | Source: Ambulatory Visit | Attending: Cardiology | Admitting: Cardiology

## 2020-07-15 VITALS — Wt 198.2 lb

## 2020-07-15 DIAGNOSIS — J432 Centrilobular emphysema: Secondary | ICD-10-CM

## 2020-07-15 NOTE — Progress Notes (Signed)
Daily Session Note  Patient Details  Name: Richard Fuentes MRN: [id number] Date of Birth: Mar 24, 1970 Referring Provider:   April Manson Pulmonary Rehab Walk Test from 07/11/2020 in [place]  Referring Provider Dr. Asencion Noble       Encounter Date: 07/15/2020  Check In:  Session Check In - 07/15/20 1504       Check-In   Supervising physician immediately available to respond to emergencies [facility] immediately available    Physician(s) Dr. Cruzita Lederer    Location [facility]    Staff Present Rosebud Poles, RN, Milus Glazier, MS, ACSM-CEP, CCRP, Exercise Physiologist;Marquavius Scaife Hassell Done, MS, ACSM-CEP, Exercise Physiologist;[name], RN    Virtual Visit No    Medication changes reported     No    Fall or balance concerns reported    No    Tobacco Cessation No Change    Warm-up and Cool-down Performed as group-led instruction    Resistance Training Performed Yes    VAD Patient? No    PAD/SET Patient? No      Pain Assessment   Currently in Pain? No/denies    Multiple Pain Sites No             Capillary Blood Glucose: No results found for this or any previous visit (from the past 24 hour(s)).   Exercise Prescription Changes - 07/15/20 1500       Response to Exercise   Blood Pressure (Admit) 110/80    Blood Pressure (Exercise) 100/70    Blood Pressure (Exit) 106/70    Heart Rate (Admit) 87 bpm    Heart Rate (Exercise) 114 bpm    Heart Rate (Exit) 102 bpm    Oxygen Saturation (Admit) 96 %    Oxygen Saturation (Exercise) 95 %    Oxygen Saturation (Exit) 97 %    Rating of Perceived Exertion (Exercise) 11    Perceived Dyspnea (Exercise) 2    Duration Continue with 30 min of aerobic exercise without signs/symptoms of physical distress.    Intensity Other (comment)   40-80% HR Max     Progression   Progression Continue to progress workloads to maintain intensity without signs/symptoms of physical distress.       Resistance Training   Training Prescription Yes    Weight Blue bands    Reps 10-15    Time 10 Minutes      NuStep   Level 1    SPM 80    Minutes 15    METs 1.5      Track   Laps 8    Minutes 15    METs 1.93             Social History   Tobacco Use  Smoking Status Former   Packs/day: 1.00   Years: 30.00   Pack years: 30.00   Types: Cigarettes   Quit date: 01/19/2018   Years since quitting: 2.4  Smokeless Tobacco Never    Goals Met:  Proper associated with RPD/PD & O2 Sat Exercise tolerated well No report of cardiac concerns or symptoms Strength training completed today  Goals Unmet:  Not Applicable  Comments: Service time is from 1315 to 1430    Dr. Fransico Him is Medical Director for Cardiac Rehab at [facility].

## 2020-07-15 NOTE — Progress Notes (Signed)
Daily Session Note  Patient Details  Name: Richard Fuentes MRN: [id number] Date of Birth: 01-Nov-1970 Referring Provider:   April Manson Pulmonary Rehab Walk Test from 07/11/2020 in [place]  Referring Provider Dr. Asencion Noble       Encounter Date: 07/15/2020  Check In:  Session Check In - 07/15/20 1504       Check-In   Supervising physician immediately available to respond to emergencies [facility] immediately available    Physician(s) Dr. Cruzita Lederer    Location [facility]    Staff Present Rosebud Poles, RN, Milus Glazier, MS, ACSM-CEP, CCRP, Exercise Physiologist;Charmon Thorson Hassell Done, MS, ACSM-CEP, Exercise Physiologist;[name], RN    Virtual Visit No    Medication changes reported     No    Fall or balance concerns reported    No    Tobacco Cessation No Change    Warm-up and Cool-down Performed as group-led instruction    Resistance Training Performed Yes    VAD Patient? No    PAD/SET Patient? No      Pain Assessment   Currently in Pain? No/denies    Multiple Pain Sites No             Capillary Blood Glucose: No results found for this or any previous visit (from the past 24 hour(s)).   Exercise Prescription Changes - 07/15/20 1500       Response to Exercise   Blood Pressure (Admit) 110/80    Blood Pressure (Exercise) 100/70    Blood Pressure (Exit) 106/70    Heart Rate (Admit) 87 bpm    Heart Rate (Exercise) 114 bpm    Heart Rate (Exit) 102 bpm    Oxygen Saturation (Admit) 96 %    Oxygen Saturation (Exercise) 95 %    Oxygen Saturation (Exit) 97 %    Rating of Perceived Exertion (Exercise) 11    Perceived Dyspnea (Exercise) 2    Duration Continue with 30 min of aerobic exercise without signs/symptoms of physical distress.    Intensity Other (comment)   40-80% HR Max     Progression   Progression Continue to progress workloads to maintain intensity without signs/symptoms of physical distress.       Resistance Training   Training Prescription Yes    Weight Blue bands    Reps 10-15    Time 10 Minutes      NuStep   Level 1    SPM 80    Minutes 15    METs 1.5      Track   Laps 8    Minutes 15    METs 1.93             Social History   Tobacco Use  Smoking Status Former   Packs/day: 1.00   Years: 30.00   Pack years: 30.00   Types: Cigarettes   Quit date: 01/19/2018   Years since quitting: 2.4  Smokeless Tobacco Never    Goals Met:  Proper associated with RPD/PD & O2 Sat Exercise tolerated well No report of cardiac concerns or symptoms Strength training completed today  Goals Unmet:  Not Applicable  Comments: Service time is from 1316 to 1430    Dr. Fransico Him is Medical Director for Cardiac Rehab at [facility].

## 2020-07-16 ENCOUNTER — Other Ambulatory Visit: Payer: Self-pay

## 2020-07-17 ENCOUNTER — Other Ambulatory Visit: Payer: Self-pay

## 2020-07-17 ENCOUNTER — Telehealth (HOSPITAL_COMMUNITY): Payer: Self-pay | Admitting: Critical Care Medicine

## 2020-07-17 ENCOUNTER — Encounter (HOSPITAL_COMMUNITY): Payer: Medicaid Other

## 2020-07-21 DIAGNOSIS — L400 Psoriasis vulgaris: Secondary | ICD-10-CM | POA: Diagnosis not present

## 2020-07-22 ENCOUNTER — Encounter (HOSPITAL_COMMUNITY)
Admission: RE | Admit: 2020-07-22 | Discharge: 2020-07-22 | Disposition: A | Discharge status: Home / Self Care | Payer: Medicaid Other | Source: Ambulatory Visit | Attending: Cardiology | Admitting: Cardiology

## 2020-07-22 ENCOUNTER — Other Ambulatory Visit: Payer: Self-pay

## 2020-07-22 DIAGNOSIS — J432 Centrilobular emphysema: Secondary | ICD-10-CM | POA: Diagnosis not present

## 2020-07-22 NOTE — Progress Notes (Signed)
Pulmonary Individual Treatment Plan  Patient Details  Name: Richard Fuentes MRN: [id number] Date of Birth: [birth year] Referring Provider:   Doristine Devoid Pulmonary Rehab Walk Test from 07/11/2020 in [facility]ARDI[facility] RE[facility]  Referring Provider Dr. Shan Levans       Initial Encounter Date:  Flowsheet Row Pulmonary Rehab Walk Test from 07/11/2020 in [facility] CARDIAC REH[facility]  Date 07/11/20       Visit Diagnosis: Centrilobular emphysema (HCC)  Patient's Home Medications on Admission:   Current Outpatient Medications:    albuterol (VENTOLIN HFA) 108 (90 Base) MCG/ACT inhaler, Inhale 2 puffs into the lungs every 6 (six) hours as needed for wheezing or shortness of breath., Disp: 8.5 g, Rfl: 1   benzonatate (TESSALON) 100 MG capsule, TAKE 1 CAPSULE BY MOUTH EVERY 8 HOURS, Disp: 42 capsule, Rfl: 0   EPINEPHrine 0.3 mg/0.3 mL IJ SOAJ injection, Inject 0.3 mg into the muscle as needed for anaphylaxis., Disp: , Rfl:    Fluticasone-Umeclidin-Vilant (TRELEGY ELLIPTA) 200-62.5-25 MCG/INH AEPB, INHALE 1 PUFF INTO THE LUNGS DAILY., Disp: 60 each, Rfl: 3   HUMIRA PEN 40 MG/0.4ML PNKT, Inject into the skin., Disp: , Rfl:    hydrocortisone 2.5 % cream, Apply  liberally to affected area twice a day, Disp: , Rfl:    ibuprofen (ADVIL) 200 MG tablet, Take 600-800 mg by mouth every 6 (six) hours as needed for mild pain., Disp: , Rfl:    ipratropium-albuterol (DUONEB) 0.5-2.5 (3) MG/3ML SOLN, Inhale 3 mLs into the lungs every 6 (six) hours as needed (sob)., Disp: , Rfl:    levocetirizine (XYZAL) 5 MG tablet, Take 1 tablet once daily as needed for runny nose or itchy eyes., Disp: 34 tablet, Rfl: 5   pantoprazole (PROTONIX) 40 MG tablet, TAKE 1 TABLET (40 MG TOTAL) BY MOUTH DAILY., Disp: 30 tablet, Rfl: 3   traZODone (DESYREL) 50 MG tablet, Take 0.5-1 tablets (25-50 mg total) by mouth at bedtime as needed for sleep., Disp: 30 tablet, Rfl: 3   Vitamin D, Ergocalciferol,  (DRISDOL) 1.25 MG (50000 UNIT) CAPS capsule, TAKE 1 CAPSULE BY MOUTH ONCE A WEEK, Disp: 8 capsule, Rfl: 0  Past Medical History: Past Medical History:  Diagnosis Date   Anxiety    Asthma    COPD (chronic obstructive pulmonary disease) (HCC)    Severe centrilobular and paraseptal bullous emphysema; Gold stage D; FEV1 and DLCO<30%.;  Currently undergoing transplant evaluation   Environmental allergies    Esophagitis    Hx of migraines    OCD (obsessive compulsive disorder)    Pneumothorax on left 03/20/2020   Psoriasis    Urticaria     Tobacco Use: Social History   Tobacco Use  Smoking Status Former   Packs/day: 1.00   Years: 30.00   Pack years: 30.00   Types: Cigarettes   Quit date: 01/19/2018   Years since quitting: 2.5  Smokeless Tobacco Never    Labs: Recent Review Flowsheet Data     Labs for ITP Cardiac and Pulmonary Rehab Latest Ref Rng & Units 04/28/2010 12/07/2018 05/28/2019   Cholestrol 100 - 199 mg/dL - 409(W) -   LDLCALC 0 - 99 mg/dL - 119(J) -   HDL >47 mg/dL - 50 -   Trlycerides 0 - 149 mg/dL - 829 -   Hemoglobin F6O 4.8 - 5.6 % - 5.8(H) -   PHART 7.350 - 7.450 - - 7.428   PCO2ART 32.0 - 48.0 mmHg - - 39.9   HCO3 20.0 -  28.0 mmol/L - - 25.9   TCO2 0 - 100 mmol/L 27 - -   O2SAT % - - 98.0       Capillary Blood Glucose: No results found for: GLUCAP   Pulmonary Assessment Scores:  Pulmonary Assessment Scores     Row Name 07/11/20 1116         ADL UCSD   ADL Phase Entry     SOB Score total 56           CAT Score     CAT Score 27           mMRC Score     mMRC Score 4            UCSD: Self-administered rating of dyspnea associated with activities of daily living (ADLs) 6-point scale (0 = "not at all" to 5 = "maximal or unable to do because of breathlessness")  Scoring Scores range from 0 to 120.  Minimally important difference is 5 units  CAT: CAT can identify the health impairment of COPD patients and is better correlated with  disease progression.  CAT has a scoring range of zero to 40. The CAT score is classified into four groups of low (less than 10), medium (10 - 20), high (21-30) and very high (31-40) based on the impact level of disease on health status. A CAT score over 10 suggests significant symptoms.  A worsening CAT score could be explained by an exacerbation, poor medication adherence, poor inhaler technique, or progression of COPD or comorbid conditions.  CAT MCID is 2 points  mMRC: mMRC (Modified Medical Research Council) Dyspnea Scale is used to assess the degree of baseline functional disability in patients of respiratory disease due to dyspnea. No minimal important difference is established. A decrease in score of 1 point or greater is considered a positive change.   Pulmonary Function Assessment:  Pulmonary Function Assessment - 07/11/20 0948       Breath   Shortness of Breath Yes;Limiting activity             Exercise Target Goals: Exercise Program Goal: Individual exercise prescription set using results from initial 6 min walk test and THRR while considering  patient's activity barriers and safety.   Exercise Prescription Goal: Initial exercise prescription builds to 30-45 minutes a day of aerobic activity, 2-3 days per week.  Home exercise guidelines will be given to patient during program as part of exercise prescription that the participant will acknowledge.  Activity Barriers & Risk Stratification:  Activity Barriers & Cardiac Risk Stratification - 07/11/20 0943       Activity Barriers & Cardiac Risk Stratification   Activity Barriers Shortness of Breath;Deconditioning;Back Problems   R+L knee meniscus repair            6 Minute Walk:  6 Minute Walk     Row Name 07/11/20 1108         6 Minute Walk   Phase Initial     Distance 1097 feet     Walk Time 6 minutes     # of Rest Breaks 0     MPH 2.08     METS 3.62     RPE 12     Perceived Dyspnea  3     VO2 Peak  12.68     Symptoms Yes (comment)     Comments c/o chest tightness. this is due to lung condition, nothing with heart.     Resting HR 84 bpm  Resting BP 106/70     Resting Oxygen Saturation  97 %     Exercise Oxygen Saturation  during 6 min walk 93 %     Max Ex. HR 108 bpm     Max Ex. BP 114/76     2 Minute Post BP 108/72           Interval HR     1 Minute HR 103     2 Minute HR 102     3 Minute HR 108     4 Minute HR 108     5 Minute HR 106     6 Minute HR 107     2 Minute Post HR 89     Interval Heart Rate? Yes           Interval Oxygen     Interval Oxygen? Yes     Baseline Oxygen Saturation % 97 %     1 Minute Oxygen Saturation % 94 %     1 Minute Liters of Oxygen 0 L     2 Minute Oxygen Saturation % 95 %     2 Minute Liters of Oxygen 0 L     3 Minute Oxygen Saturation % 93 %     3 Minute Liters of Oxygen 0 L     4 Minute Oxygen Saturation % 95 %     4 Minute Liters of Oxygen 0 L     5 Minute Oxygen Saturation % 97 %     5 Minute Liters of Oxygen 0 L     6 Minute Oxygen Saturation % 96 %     6 Minute Liters of Oxygen 0 L     2 Minute Post Oxygen Saturation % 96 %     2 Minute Post Liters of Oxygen 0 L             Oxygen Initial Assessment:  Oxygen Initial Assessment - 07/11/20 0947       Home Oxygen   Home Oxygen Device Portable Concentrator;Home Concentrator    Sleep Oxygen Prescription Continuous    Liters per minute 1    Home Exercise Oxygen Prescription None    Home Resting Oxygen Prescription None    Compliance with Home Oxygen Use Yes      Initial 6 min Walk   Oxygen Used None      Program Oxygen Prescription   Program Oxygen Prescription None      Intervention   Short Term Goals To learn and understand importance of monitoring SPO2 with pulse oximeter and demonstrate accurate use of the pulse oximeter.;To learn and understand importance of maintaining oxygen saturations>88%;To learn and demonstrate proper pursed lip breathing techniques or  other breathing techniques. ;To learn and demonstrate proper use of respiratory medications    Long  Term Goals Verbalizes importance of monitoring SPO2 with pulse oximeter and return demonstration;Exhibits proper breathing techniques, such as pursed lip breathing or other method taught during program session;Maintenance of O2 saturations>88%;Compliance with respiratory medication;Demonstrates proper use of MDI's             Oxygen Re-Evaluation:  Oxygen Re-Evaluation     Row Name 07/21/20 1514             Program Oxygen Prescription   Program Oxygen Prescription None               Home Oxygen     Home Oxygen Device Portable Concentrator;Home Concentrator  Sleep Oxygen Prescription Continuous       Liters per minute 1       Home Exercise Oxygen Prescription None       Home Resting Oxygen Prescription None       Compliance with Home Oxygen Use Yes               Goals/Expected Outcomes     Short Term Goals To learn and understand importance of monitoring SPO2 with pulse oximeter and demonstrate accurate use of the pulse oximeter.;To learn and understand importance of maintaining oxygen saturations>88%;To learn and demonstrate proper pursed lip breathing techniques or other breathing techniques. ;To learn and demonstrate proper use of respiratory medications       Long  Term Goals Verbalizes importance of monitoring SPO2 with pulse oximeter and return demonstration;Exhibits proper breathing techniques, such as pursed lip breathing or other method taught during program session;Maintenance of O2 saturations>88%;Compliance with respiratory medication;Demonstrates proper use of MDI's       Goals/Expected Outcomes compliance and understanding of oxygen saturation and pursed lip breathing               Oxygen Discharge (Final Oxygen Re-Evaluation):  Oxygen Re-Evaluation - 07/21/20 1514       Program Oxygen Prescription   Program Oxygen Prescription None      Home Oxygen    Home Oxygen Device Portable Concentrator;Home Concentrator    Sleep Oxygen Prescription Continuous    Liters per minute 1    Home Exercise Oxygen Prescription None    Home Resting Oxygen Prescription None    Compliance with Home Oxygen Use Yes      Goals/Expected Outcomes   Short Term Goals To learn and understand importance of monitoring SPO2 with pulse oximeter and demonstrate accurate use of the pulse oximeter.;To learn and understand importance of maintaining oxygen saturations>88%;To learn and demonstrate proper pursed lip breathing techniques or other breathing techniques. ;To learn and demonstrate proper use of respiratory medications    Long  Term Goals Verbalizes importance of monitoring SPO2 with pulse oximeter and return demonstration;Exhibits proper breathing techniques, such as pursed lip breathing or other method taught during program session;Maintenance of O2 saturations>88%;Compliance with respiratory medication;Demonstrates proper use of MDI's    Goals/Expected Outcomes compliance and understanding of oxygen saturation and pursed lip breathing             Initial Exercise Prescription:  Initial Exercise Prescription - 07/11/20 1100       Date of Initial Exercise RX and Referring Provider   Date 07/11/20    Referring Provider Dr. Shan Levans    Expected Discharge Date 09/11/20      NuStep   Level 1    SPM 80    Minutes 15      Track   Minutes 15      Prescription Details   Frequency (times per week) 2    Duration Progress to 30 minutes of continuous aerobic without signs/symptoms of physical distress      Intensity   THRR 40-80% of Max Heartrate 68-135    Ratings of Perceived Exertion 11-13    Perceived Dyspnea 0-4      Progression   Progression Continue to progress workloads to maintain intensity without signs/symptoms of physical distress.      Resistance Training   Training Prescription Yes    Weight Blue bands    Reps 10-15              Perform Capillary Blood Glucose  checks as needed.  Exercise Prescription Changes:   Exercise Prescription Changes     Row Name 07/15/20 1500             Response to Exercise   Blood Pressure (Admit) 110/80       Blood Pressure (Exercise) 100/70       Blood Pressure (Exit) 106/70       Heart Rate (Admit) 87 bpm       Heart Rate (Exercise) 114 bpm       Heart Rate (Exit) 102 bpm       Oxygen Saturation (Admit) 96 %       Oxygen Saturation (Exercise) 95 %       Oxygen Saturation (Exit) 97 %       Rating of Perceived Exertion (Exercise) 11       Perceived Dyspnea (Exercise) 2       Duration Continue with 30 min of aerobic exercise without signs/symptoms of physical distress.       Intensity Other (comment)  40-80% HR Max               Progression     Progression Continue to progress workloads to maintain intensity without signs/symptoms of physical distress.               Resistance Training     Training Prescription Yes       Weight Blue bands       Reps 10-15       Time 10 Minutes               NuStep     Level 1       SPM 80       Minutes 15       METs 1.5               Track     Laps 8       Minutes 15       METs 1.93               Exercise Comments:   Exercise Comments     Row Name 07/15/20 1533           Exercise Comments Patient completed first day of exercise and tolerated well. He does have a hernia and that was bothered by the Nustep. He stated it wasn't pain but just discomfort. He just got scheduled for surgery to repair that so for the time being we will avoid the Nustep and anything that aggravates that area. Other than that he tolerated walking, resistance training, and warmup/cool down well. Will continue to monitor.                Exercise Goals and Review:   Exercise Goals     Row Name 07/11/20 1009             Exercise Goals   Increase Physical Activity Yes       Intervention Provide advice, education, support  and counseling about physical activity/exercise needs.;Develop an individualized exercise prescription for aerobic and resistive training based on initial evaluation findings, risk stratification, comorbidities and participant's personal goals.       Expected Outcomes Short Term: Attend rehab on a regular basis to increase amount of physical activity.;Long Term: Add in home exercise to make exercise part of routine and to increase amount of physical activity.;Long Term: Exercising regularly at least 3-5 days a week.       Increase  Strength and Stamina Yes       Intervention Provide advice, education, support and counseling about physical activity/exercise needs.;Develop an individualized exercise prescription for aerobic and resistive training based on initial evaluation findings, risk stratification, comorbidities and participant's personal goals.       Expected Outcomes Short Term: Increase workloads from initial exercise prescription for resistance, speed, and METs.;Short Term: Perform resistance training exercises routinely during rehab and add in resistance training at home;Long Term: Improve cardiorespiratory fitness, muscular endurance and strength as measured by increased METs and functional capacity ( )       Able to understand and use rate of perceived exertion (RPE) scale Yes       Intervention Provide education and explanation on how to use RPE scale       Expected Outcomes Short Term: Able to use RPE daily in rehab to express subjective intensity level;Long Term:  Able to use RPE to guide intensity level when exercising independently       Able to understand and use Dyspnea scale Yes       Intervention Provide education and explanation on how to use Dyspnea scale       Expected Outcomes Short Term: Able to use Dyspnea scale daily in rehab to express subjective sense of shortness of breath during exertion;Long Term: Able to use Dyspnea scale to guide intensity level when exercising  independently       Knowledge and understanding of Target Heart Rate Range (THRR) Yes       Intervention Provide education and explanation of THRR including how the numbers were predicted and where they are located for reference       Expected Outcomes Short Term: Able to state/look up THRR;Long Term: Able to use THRR to govern intensity when exercising independently;Short Term: Able to use daily as guideline for intensity in rehab       Understanding of Exercise Prescription Yes       Intervention Provide education, explanation, and written materials on patient's individual exercise prescription       Expected Outcomes Short Term: Able to explain program exercise prescription;Long Term: Able to explain home exercise prescription to exercise independently                Exercise Goals Re-Evaluation :  Exercise Goals Re-Evaluation     Row Name 07/21/20 1513             Exercise Goal Re-Evaluation   Exercise Goals Review Increase Physical Activity;Increase Strength and Stamina;Able to understand and use rate of perceived exertion (RPE) scale;Able to understand and use Dyspnea scale;Knowledge and understanding of Target Heart Rate Range (THRR);Understanding of Exercise Prescription       Comments Patient has completed 1 exercise session and tolerated well. It is too early to note any progression. Will continue to monitor and progress as he is able.       Expected Outcomes Through exercise at rehab and home, the patient will decrease shortness of breath with daily activities and feel confident in carrying out an exercise regimn at home.                Discharge Exercise Prescription (Final Exercise Prescription Changes):  Exercise Prescription Changes - 07/15/20 1500       Response to Exercise   Blood Pressure (Admit) 110/80    Blood Pressure (Exercise) 100/70    Blood Pressure (Exit) 106/70    Heart Rate (Admit) 87 bpm    Heart Rate (Exercise) 114 bpm  Heart Rate (Exit) 102  bpm    Oxygen Saturation (Admit) 96 %    Oxygen Saturation (Exercise) 95 %    Oxygen Saturation (Exit) 97 %    Rating of Perceived Exertion (Exercise) 11    Perceived Dyspnea (Exercise) 2    Duration Continue with 30 min of aerobic exercise without signs/symptoms of physical distress.    Intensity Other (comment)   40-80% HR Max     Progression   Progression Continue to progress workloads to maintain intensity without signs/symptoms of physical distress.      Resistance Training   Training Prescription Yes    Weight Blue bands    Reps 10-15    Time 10 Minutes      NuStep   Level 1    SPM 80    Minutes 15    METs 1.5      Track   Laps 8    Minutes 15    METs 1.93             Nutrition:  Target Goals: Understanding of nutrition guidelines, daily intake of sodium 1500mg , cholesterol 200mg , calories 30% from fat and 7% or less from saturated fats, daily to have 5 or more servings of fruits and vegetables.  Biometrics:  Pre Biometrics - 07/11/20 1116       Pre Biometrics   Grip Strength 50 kg              Nutrition Therapy Plan and Nutrition Goals:  Nutrition Therapy & Goals - 07/21/20 1425       Nutrition Therapy   RD appointment deferred Yes             Nutrition Assessments:  MEDIFICTS Score Key: ?70 Need to make dietary changes  40-70 Heart Healthy Diet ? 40 Therapeutic Level Cholesterol Diet   Picture Your Plate Scores: <16 Unhealthy dietary pattern with much room for improvement. 41-50 Dietary pattern unlikely to meet recommendations for good health and room for improvement. 51-60 More healthful dietary pattern, with some room for improvement.  >60 Healthy dietary pattern, although there may be some specific behaviors that could be improved.    Nutrition Goals Re-Evaluation:   Nutrition Goals Discharge (Final Nutrition Goals Re-Evaluation):   Psychosocial: Target Goals: Acknowledge presence or absence of significant depression  and/or stress, maximize coping skills, provide positive support system. Participant is able to verbalize types and ability to use techniques and skills needed for reducing stress and depression.  Initial Review & Psychosocial Screening:  Initial Psych Review & Screening - 07/11/20 1004       Initial Review   Comments He is unable to work due to his lung illness and condition. 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      Barriers   Psychosocial barriers to participate in program The patient should benefit from training in stress management and relaxation.;Psychosocial barriers identified (see note)      Screening Interventions   Interventions Encouraged to exercise    Expected Outcomes Long Term Goal: Stressors or current issues are controlled or eliminated.;Long Term goal: The participant improves quality of Life and PHQ9 Scores as seen by post scores and/or verbalization of changes             Quality of Life Scores:  Scores of 19 and below usually indicate a poorer quality of life in these areas.  A difference of  2-3 points is a clinically meaningful  difference.  A difference of 2-3 points in the total score of the Quality of Life Index has been associated with significant improvement in overall quality of life, self-image, physical symptoms, and general health in studies assessing change in quality of life.  PHQ-9: Recent Review Flowsheet Data     Depression screen [facility] 2/9 07/11/2020 04/08/2020 10/24/2019 10/12/2019 10/11/2019   Decreased Interest 0 2 2 - 2   Down, Depressed, Hopeless - 2   PHQ - 2 Score - 4   Altered sleeping - 3   Tired, decreased energy - 3   Change in appetite 0 3 2 - 3   Feeling bad or failure about yourself  - 3   Trouble concentrating - 3   Moving slowly or fidgety/restless 0 2 2 - 3   Suicidal thoughts 0 0 0 - 0   PHQ-9 Score - 22 21 - 22   Difficult doing work/chores Somewhat  difficult - - Very difficult Somewhat difficult      Interpretation of Total Score  Total Score Depression Severity:  1-4 = Minimal depression, 5-9 = Mild depression, 10-14 = Moderate depression, 15-19 = Moderately severe depression, 20-27 = Severe depression   Psychosocial Evaluation and Intervention:  Psychosocial Evaluation - 07/11/20 1103       Psychosocial Evaluation & Interventions   Interventions Encouraged to exercise with the program and follow exercise prescription    Comments pt states that his stress level has decreased some since last time he was here. He is still unemployed and is unable to do things he enjoys due to his illness.    Expected Outcomes For pt to learn healthy coping mechanism for stress and depression and to improve quality of life as shown through PHQ9 scores.    Continue Psychosocial Services  Follow up required by staff             Psychosocial Re-Evaluation:  Psychosocial Re-Evaluation     Row Name 07/21/20 1411             Psychosocial Re-Evaluation   Current issues with Current Anxiety/Panic;Current Stress Concerns;Current Sleep Concerns;Current Depression       Comments Due to his chronic illnesses and disability he is not able to work which is a major issue.  He had a procure at [facility] to improve his emphysema and it has helped.  He is more positive than the last ime he was in the program.       Expected Outcomes For Bern to handle his stress and depression in healthy ways and consider treatment for depression.       Interventions Encouraged to attend Pulmonary Rehabilitation for the exercise;Relaxation education;Stress management education       Continue Psychosocial Services  Follow up required by staff                Psychosocial Discharge (Final Psychosocial Re-Evaluation):  Psychosocial Re-Evaluation - 07/21/20 1411       Psychosocial Re-Evaluation   Current issues with Current Anxiety/Panic;Current Stress Concerns;Current  Sleep Concerns;Current Depression    Comments Due to his chronic illnesses and disability he is not able to work which is a major issue.  He had a procure at [facility] to improve his emphysema and it has helped.  He is more positive than the last ime he was in the program.    Expected Outcomes For Dyson to handle  his stress and depression in healthy ways and consider treatment for depression.    Interventions Encouraged to attend Pulmonary Rehabilitation for the exercise;Relaxation education;Stress management education    Continue Psychosocial Services  Follow up required by staff             Education: Education Goals: Education classes will be provided on a weekly basis, covering required topics. Participant will state understanding/return demonstration of topics presented.  Learning Barriers/Preferences:  Learning Barriers/Preferences - 07/11/20 0954       Learning Barriers/Preferences   Learning Barriers Sight   wears glasses   Learning Preferences Computer/Internet;Individual Instruction;Video             Education Topics: Risk Factor Reduction:  -Group instruction that is supported by a PowerPoint presentation. Instructor discusses the definition of a risk factor, different risk factors for pulmonary disease, and how the heart and lungs work together.     Nutrition for Pulmonary Patient:  -Group instruction provided by PowerPoint slides, verbal discussion, and written materials to support subject matter. The instructor gives an explanation and review of healthy diet recommendations, which includes a discussion on weight management, recommendations for fruit and vegetable consumption, as well as protein, fluid, caffeine, fiber, sodium, sugar, and alcohol. Tips for eating when patients are short of breath are discussed.   Pursed Lip Breathing:  -Group instruction that is supported by demonstration and informational handouts. Instructor discusses the benefits of pursed lip and  diaphragmatic breathing and detailed demonstration on how to preform both.   Flowsheet Row PULMONARY REHAB OTHER RESPIRATORY from 11/15/2019 in [facility] CARDIAC REHAB  Date 11/15/19  Educator Handout How to beat a sedentary lifestyle       Oxygen Safety:  -Group instruction provided by PowerPoint, verbal discussion, and written material to support subject matter. There is an overview of "What is Oxygen" and "Why do we need it".  Instructor also reviews how to create a safe environment for oxygen use, the importance of using oxygen as prescribed, and the risks of noncompliance. There is a brief discussion on traveling with oxygen and resources the patient may utilize.   Oxygen Equipment:  -Group instruction provided by [facility] Staff utilizing handouts, written materials, and equipment demonstrations.   Signs and Symptoms:  -Group instruction provided by written material and verbal discussion to support subject matter. Warning signs and symptoms of infection, stroke, and heart attack are reviewed and when to call the physician/911 reinforced. Tips for preventing the spread of infection discussed.   Advanced Directives:  -Group instruction provided by verbal instruction and written material to support subject matter. Instructor reviews Advanced Directive laws and proper instruction for filling out document.   Pulmonary Video:  -Group video education that reviews the importance of medication and oxygen compliance, exercise, good nutrition, pulmonary hygiene, and pursed lip and diaphragmatic breathing for the pulmonary patient.   Exercise for the Pulmonary Patient:  -Group instruction that is supported by a PowerPoint presentation. Instructor discusses benefits of exercise, core components of exercise, frequency, duration, and intensity of an exercise routine, importance of utilizing pulse oximetry during exercise, safety while exercising, and options of places to  exercise outside of rehab.     Pulmonary Medications:  -Verbally interactive group education provided by instructor with focus on inhaled medications and proper administration.   Anatomy and Physiology of the Respiratory System and Intimacy:  -Group instruction provided by PowerPoint, verbal discussion, and written material to support subject matter. Instructor reviews respiratory cycle and  anatomical components of the respiratory system and their functions. Instructor also reviews differences in obstructive and restrictive respiratory diseases with examples of each. Intimacy, Sex, and Sexuality differences are reviewed with a discussion on how relationships can change when diagnosed with pulmonary disease. Common sexual concerns are reviewed.   MD DAY -A group question and answer session with a medical doctor that allows participants to ask questions that relate to their pulmonary disease state.   OTHER EDUCATION -Group or individual verbal, written, or video instructions that support the educational goals of the pulmonary rehab program.   Holiday Eating Survival Tips:  -Group instruction provided by PowerPoint slides, verbal discussion, and written materials to support subject matter. The instructor gives patients tips, tricks, and techniques to help them not only survive but enjoy the holidays despite the onslaught of food that accompanies the holidays.   Knowledge Questionnaire Score:  Knowledge Questionnaire Score - 07/11/20 1120       Knowledge Questionnaire Score   Pre Score 12/18             Core Components/Risk Factors/Patient Goals at Admission:  Personal Goals and Risk Factors at Admission - 07/11/20 0954       Core Components/Risk Factors/Patient Goals on Admission    Weight Management Weight Loss;Yes    Intervention Weight Management: Develop a combined nutrition and exercise program designed to reach desired caloric intake, while maintaining appropriate intake  of nutrient and fiber, sodium and fats, and appropriate energy expenditure required for the weight goal.;Weight Management/Obesity: Establish reasonable short term and long term weight goals.;Obesity: Provide education and appropriate resources to help participant work on and attain dietary goals.    Expected Outcomes Long Term: Adherence to nutrition and physical activity/exercise program aimed toward attainment of established weight goal;Weight Loss: Understanding of general recommendations for a balanced deficit meal plan, which promotes 1-2 lb weight loss per week and includes a negative energy balance of [phone] kcal/d    Improve shortness of breath with ADL's Yes    Intervention Provide education, individualized exercise plan and daily activity instruction to help decrease symptoms of SOB with activities of daily living.    Expected Outcomes Short Term: Improve cardiorespiratory fitness to achieve a reduction of symptoms when performing ADLs;Long Term: Be able to perform more ADLs without symptoms or delay the onset of symptoms             Core Components/Risk Factors/Patient Goals Review:   Goals and Risk Factor Review     Row Name 07/21/20 1419             Core Components/Risk Factors/Patient Goals Review   Personal Goals Review Increase knowledge of respiratory medications and ability to use respiratory devices properly.;Develop more efficient breathing techniques such as purse lipped breathing and diaphragmatic breathing and practicing self-pacing with activity.;Improve shortness of breath with ADL's       Review Davonne has attended 1 exercise session, too early to see progression towards goals.       Expected Outcomes See admission goals.                Core Components/Risk Factors/Patient Goals at Discharge (Final Review):   Goals and Risk Factor Review - 07/21/20 1419       Core Components/Risk Factors/Patient Goals Review   Personal Goals Review Increase knowledge of  respiratory medications and ability to use respiratory devices properly.;Develop more efficient breathing techniques such as purse lipped breathing and diaphragmatic breathing and practicing self-pacing with activity.;Improve shortness  of breath with ADL's    Review Jaydyn has attended 1 exercise session, too early to see progression towards goals.    Expected Outcomes See admission goals.             ITP Comments:   Comments:  Santos has completed 1 exercise session in Pulmonary rehab. Pulmonary rehab staff will continue to monitor and reassess progress toward goals during her participation in Pulmonary Rehab.

## 2020-07-22 NOTE — Progress Notes (Signed)
Daily Session Note  Patient Details  Name: Adil Tugwell MRN: [id number] Date of Birth: 1970-03-20 Referring Provider:   April Manson Pulmonary Rehab Walk Test from 07/11/2020 in [place]  Referring Provider Dr. Asencion Noble       Encounter Date: 07/22/2020  Check In:   Capillary Blood Glucose: No results found for this or any previous visit (from the past 24 hour(s)).    Social History   Tobacco Use  Smoking Status Former   Packs/day: 1.00   Years: 30.00   Pack years: 30.00   Types: Cigarettes   Quit date: 01/19/2018   Years since quitting: 2.5  Smokeless Tobacco Never    Goals Met:  Proper associated with RPD/PD & O2 Sat Exercise tolerated well No report of cardiac concerns or symptoms Strength training completed today  Goals Unmet:  Not Applicable  Comments: Service time is from 1320 to 1430    Dr. Fransico Him is Medical Director for Cardiac Rehab at [facility].

## 2020-07-24 ENCOUNTER — Telehealth (HOSPITAL_COMMUNITY): Payer: Self-pay | Admitting: Critical Care Medicine

## 2020-07-24 ENCOUNTER — Encounter (HOSPITAL_COMMUNITY): Payer: Medicaid Other

## 2020-07-25 NOTE — Progress Notes (Signed)
Pt did not show up for exercise session 07/24/20 and called stating today was his last day due to scheduled surgery 08/11/20. Pt will be discharged from program and future appointments will be cancelled.

## 2020-07-28 DIAGNOSIS — J449 Chronic obstructive pulmonary disease, unspecified: Secondary | ICD-10-CM | POA: Diagnosis not present

## 2020-07-29 ENCOUNTER — Encounter (HOSPITAL_COMMUNITY): Payer: Medicaid Other

## 2020-07-31 ENCOUNTER — Encounter (HOSPITAL_COMMUNITY): Payer: Medicaid Other

## 2020-08-04 DIAGNOSIS — J449 Chronic obstructive pulmonary disease, unspecified: Secondary | ICD-10-CM | POA: Diagnosis not present

## 2020-08-04 DIAGNOSIS — J432 Centrilobular emphysema: Secondary | ICD-10-CM | POA: Diagnosis not present

## 2020-08-04 NOTE — Progress Notes (Signed)
Discharge Progress Report  Patient Details  Name: Richard Fuentes MRN: [id number] Date of Birth: [birth year] Referring Provider:   Doristine DevoidFlowsheet Row Pulmonary Rehab [facility]  Referring Provider Dr. Shan LevansPatrick Wright        Number of Visits: 2  Reason for Discharge:  Early Exit:  After enrolling in pulmonary rehab he was scheduled for a hernia repair.  Richard Fuentes was unaware of this at orientation/walk test appointment.  Smoking History:  Social History   Tobacco Use  Smoking Status Former   Packs/day: 1.00   Years: 30.00   Pack years: 30.00   Types: Cigarettes   Quit date: 01/19/2018   Years since quitting: 2.5  Smokeless Tobacco Never    Diagnosis:  Centrilobular emphysema (HCC)  ADL UCSD:  Pulmonary Assessment Scores     Row Name 07/11/20 1116         ADL UCSD   ADL Phase Entry     SOB Score total 56           CAT Score     CAT Score 27           mMRC Score     mMRC Score 4             Initial Exercise Prescription:  Initial Exercise Prescription - 07/11/20 1100       Date of Initial Exercise RX and Referring Provider   Date 07/11/20    Referring Provider Dr. Shan LevansPatrick Wright    Expected Discharge Date 09/11/20      NuStep   Level 1    SPM 80    Minutes 15      Track   Minutes 15      Prescription Details   Frequency (times per week) 2    Duration Progress to 30 minutes of continuous aerobic without signs/symptoms of physical distress      Intensity   THRR 40-80% of Max Heartrate 68-135    Ratings of Perceived Exertion 11-13    Perceived Dyspnea 0-4      Progression   Progression Continue to progress workloads to maintain intensity without signs/symptoms of physical distress.      Resistance Training   Training Prescription Yes    Weight Blue bands    Reps 10-15             Discharge Exercise Prescription (Final Exercise Prescription Changes):  Exercise Prescription Changes -  07/15/20 1500       Response to Exercise   Blood Pressure (Admit) 110/80    Blood Pressure (Exercise) 100/70    Blood Pressure (Exit) 106/70    Heart Rate (Admit) 87 bpm    Heart Rate (Exercise) 114 bpm    Heart Rate (Exit) 102 bpm    Oxygen Saturation (Admit) 96 %    Oxygen Saturation (Exercise) 95 %    Oxygen Saturation (Exit) 97 %    Rating of Perceived Exertion (Exercise) 11    Perceived Dyspnea (Exercise) 2    Duration Continue with 30 min of aerobic exercise without signs/symptoms of physical distress.    Intensity Other (comment)   40-80% HR Max     Progression   Progression Continue to progress workloads to maintain intensity without signs/symptoms of physical distress.      Resistance Training   Training Prescription Yes    Weight Blue bands    Reps 10-15    Time 10 Minutes  NuStep   Level 1    SPM 80    Minutes 15    METs 1.5      Track   Laps 8    Minutes 15    METs 1.93             Functional Capacity:  6 Minute Walk     Row Name 07/11/20 1108         6 Minute Walk   Phase Initial     Distance 1097 feet     Walk Time 6 minutes     # of Rest Breaks 0     MPH 2.08     METS 3.62     RPE 12     Perceived Dyspnea  3     VO2 Peak 12.68     Symptoms Yes (comment)     Comments c/o chest tightness. this is due to lung condition, nothing with heart.     Resting HR 84 bpm     Resting BP 106/70     Resting Oxygen Saturation  97 %     Exercise Oxygen Saturation  during 6 min walk 93 %     Max Ex. HR 108 bpm     Max Ex. BP 114/76     2 Minute Post BP 108/72           Interval HR     1 Minute HR 103     2 Minute HR 102     3 Minute HR 108     4 Minute HR 108     5 Minute HR 106     6 Minute HR 107     2 Minute Post HR 89     Interval Heart Rate? Yes           Interval Oxygen     Interval Oxygen? Yes     Baseline Oxygen Saturation % 97 %     1 Minute Oxygen Saturation % 94 %     1 Minute Liters of Oxygen 0 L     2 Minute Oxygen  Saturation % 95 %     2 Minute Liters of Oxygen 0 L     3 Minute Oxygen Saturation % 93 %     3 Minute Liters of Oxygen 0 L     4 Minute Oxygen Saturation % 95 %     4 Minute Liters of Oxygen 0 L     5 Minute Oxygen Saturation % 97 %     5 Minute Liters of Oxygen 0 L     6 Minute Oxygen Saturation % 96 %     6 Minute Liters of Oxygen 0 L     2 Minute Post Oxygen Saturation % 96 %     2 Minute Post Liters of Oxygen 0 L             Psychological, QOL, Others - Outcomes: PHQ 2/9: Depression screen [facility] 2/9 07/11/2020 04/08/2020 10/24/2019 10/12/2019 10/11/2019  Decreased Interest 0 2 2 - 2  Down, Depressed, Hopeless 1 3 3  - 2  PHQ - 2 Score 1 5 5  - 4  Altered sleeping 3 3 3  - 3  Tired, decreased energy 3 3 3  - 3  Change in appetite 0 3 2 - 3  Feeling bad or failure about yourself  1 3 3  - 3  Trouble concentrating 3 3 3  - 3  Moving slowly or fidgety/restless 0 2 2 -  3  Suicidal thoughts 0 0 0 - 0  PHQ-9 Score - 22 21 - 22  Difficult doing work/chores Somewhat difficult - - Very difficult Somewhat difficult    Quality of Life:   Personal Goals: Goals established at orientation with interventions provided to work toward goal.  Personal Goals and Risk Factors at Admission - 07/11/20 0954       Core Components/Risk Factors/Patient Goals on Admission    Weight Management Weight Loss;Yes    Intervention Weight Management: Develop a combined nutrition and exercise program designed to reach desired caloric intake, while maintaining appropriate intake of nutrient and fiber, sodium and fats, and appropriate energy expenditure required for the weight goal.;Weight Management/Obesity: Establish reasonable short term and long term weight goals.;Obesity: Provide education and appropriate resources to help participant work on and attain dietary goals.    Expected Outcomes Long Term: Adherence to nutrition and physical activity/exercise program aimed toward attainment of established weight  goal;Weight Loss: Understanding of general recommendations for a balanced deficit meal plan, which promotes 1-2 lb weight loss per week and includes a negative energy balance of [phone] kcal/d    Improve shortness of breath with ADL's Yes    Intervention Provide education, individualized exercise plan and daily activity instruction to help decrease symptoms of SOB with activities of daily living.    Expected Outcomes Short Term: Improve cardiorespiratory fitness to achieve a reduction of symptoms when performing ADLs;Long Term: Be able to perform more ADLs without symptoms or delay the onset of symptoms              Personal Goals Discharge:  Goals and Risk Factor Review     Row Name 07/21/20 1419             Core Components/Risk Factors/Patient Goals Review   Personal Goals Review Increase knowledge of respiratory medications and ability to use respiratory devices properly.;Develop more efficient breathing techniques such as purse lipped breathing and diaphragmatic breathing and practicing self-pacing with activity.;Improve shortness of breath with ADL's       Review Taeshawn has attended 1 exercise session, too early to see progression towards goals.       Expected Outcomes See admission goals.                Exercise Goals and Review:  Exercise Goals     Row Name 07/11/20 1009             Exercise Goals   Increase Physical Activity Yes       Intervention Provide advice, education, support and counseling about physical activity/exercise needs.;Develop an individualized exercise prescription for aerobic and resistive training based on initial evaluation findings, risk stratification, comorbidities and participant's personal goals.       Expected Outcomes Short Term: Attend rehab on a regular basis to increase amount of physical activity.;Long Term: Add in home exercise to make exercise part of routine and to increase amount of physical activity.;Long Term: Exercising regularly  at least 3-5 days a week.       Increase Strength and Stamina Yes       Intervention Provide advice, education, support and counseling about physical activity/exercise needs.;Develop an individualized exercise prescription for aerobic and resistive training based on initial evaluation findings, risk stratification, comorbidities and participant's personal goals.       Expected Outcomes Short Term: Increase workloads from initial exercise prescription for resistance, speed, and METs.;Short Term: Perform resistance training exercises routinely during rehab and add in resistance training at  home;Long Term: Improve cardiorespiratory fitness, muscular endurance and strength as measured by increased METs and functional capacity ( )       Able to understand and use rate of perceived exertion (RPE) scale Yes       Intervention Provide education and explanation on how to use RPE scale       Expected Outcomes Short Term: Able to use RPE daily in rehab to express subjective intensity level;Long Term:  Able to use RPE to guide intensity level when exercising independently       Able to understand and use Dyspnea scale Yes       Intervention Provide education and explanation on how to use Dyspnea scale       Expected Outcomes Short Term: Able to use Dyspnea scale daily in rehab to express subjective sense of shortness of breath during exertion;Long Term: Able to use Dyspnea scale to guide intensity level when exercising independently       Knowledge and understanding of Target Heart Rate Range (THRR) Yes       Intervention Provide education and explanation of THRR including how the numbers were predicted and where they are located for reference       Expected Outcomes Short Term: Able to state/look up THRR;Long Term: Able to use THRR to govern intensity when exercising independently;Short Term: Able to use daily as guideline for intensity in rehab       Understanding of Exercise Prescription Yes        Intervention Provide education, explanation, and written materials on patient's individual exercise prescription       Expected Outcomes Short Term: Able to explain program exercise prescription;Long Term: Able to explain home exercise prescription to exercise independently                Exercise Goals Re-Evaluation:  Exercise Goals Re-Evaluation     Row Name 07/21/20 1513             Exercise Goal Re-Evaluation   Exercise Goals Review Increase Physical Activity;Increase Strength and Stamina;Able to understand and use rate of perceived exertion (RPE) scale;Able to understand and use Dyspnea scale;Knowledge and understanding of Target Heart Rate Range (THRR);Understanding of Exercise Prescription       Comments Patient has completed 1 exercise session and tolerated well. It is too early to note any progression. Will continue to monitor and progress as he is able.       Expected Outcomes Through exercise at rehab and home, the patient will decrease shortness of breath with daily activities and feel confident in carrying out an exercise regimn at home.                Nutrition & Weight - Outcomes:  Pre Biometrics - 07/11/20 1116       Pre Biometrics   Grip Strength 50 kg              Nutrition:  Nutrition Therapy & Goals - 07/21/20 1425       Nutrition Therapy   RD appointment deferred Yes             Nutrition Discharge:   Education Questionnaire Score:  Knowledge Questionnaire Score - 07/11/20 1120       Knowledge Questionnaire Score   Pre Score 12/18             Goals reviewed with patient; copy given to patient.

## 2020-08-05 ENCOUNTER — Other Ambulatory Visit: Payer: Self-pay

## 2020-08-05 ENCOUNTER — Ambulatory Visit: Payer: Self-pay | Admitting: General Surgery

## 2020-08-05 ENCOUNTER — Encounter (HOSPITAL_COMMUNITY): Payer: Medicaid Other

## 2020-08-05 NOTE — Pre-Procedure Instructions (Signed)
Surgical Instructions    Your procedure is scheduled on Monday 08/11/20.   Report to [facility] Main Entrance "A" at 06:45 A.M., then check in with the Admitting office.  Call this number if you have problems the morning of surgery:  [phone]   If you have any questions prior to your surgery date call [phone]: Open Monday-Friday 8am-4pm    Remember:  Do not eat or drink after midnight the night before your surgery     Take these medicines the morning of surgery with A SIP OF WATER   benzonatate (TESSALON)  Fluticasone-Umeclidin-Vilant (TRELEGY ELLIPTA)   ipratropium-albuterol (DUONEB)   Take these medicines if needed:  albuterol (VENTOLIN HFA)  ipratropium-albuterol (DUONEB)   As of today, STOP taking any Aspirin (unless otherwise instructed by your surgeon) Aleve, Naproxen, Ibuprofen, Motrin, Advil, Goody's, BC's, all herbal medications, fish oil, and all vitamins.                     Do NOT Smoke (Tobacco/Vaping) or drink Alcohol 24 hours prior to your procedure.  If you use a CPAP at night, you may bring all equipment for your overnight stay.   Contacts, glasses, piercing's, hearing aid's, dentures or partials may not be worn into surgery, please bring cases for these belongings.    For patients admitted to the hospital, discharge time will be determined by your treatment team.   Patients discharged the day of surgery will not be allowed to drive home, and someone needs to stay with them for 24 hours.  ONLY 1 SUPPORT PERSON MAY BE PRESENT WHILE YOU ARE IN SURGERY. IF YOU ARE TO BE ADMITTED ONCE YOU ARE IN YOUR ROOM YOU WILL BE ALLOWED TWO (2) VISITORS.  Minor children may have two parents present. Special consideration for safety and communication needs will be reviewed on a case by case basis.   Special instructions:   [place]- Preparing For Surgery  Before surgery, you can play an important role. Because skin is not sterile, your skin needs to be as free of  germs as possible. You can reduce the number of germs on your skin by washing with CHG (chlorahexidine gluconate) Soap before surgery.  CHG is an antiseptic cleaner which kills germs and bonds with the skin to continue killing germs even after washing.    Oral Hygiene is also important to reduce your risk of infection.  Remember - BRUSH YOUR TEETH THE MORNING OF SURGERY WITH YOUR REGULAR TOOTHPASTE  Please do not use if you have an allergy to CHG or antibacterial soaps. If your skin becomes reddened/irritated stop using the CHG.  Do not shave (including legs and underarms) for at least 48 hours prior to first CHG shower. It is OK to shave your face.  Please follow these instructions carefully.   Shower the NIGHT BEFORE SURGERY and the MORNING OF SURGERY  If you chose to wash your hair, wash your hair first as usual with your normal shampoo.  After you shampoo, rinse your hair and body thoroughly to remove the shampoo.  Use CHG Soap as you would any other liquid soap. You can apply CHG directly to the skin and wash gently with a scrungie or a clean washcloth.   Apply the CHG Soap to your body ONLY FROM THE NECK DOWN.  Do not use on open wounds or open sores. Avoid contact with your eyes, ears, mouth and genitals (private parts). Wash Face and genitals (private parts)  with your normal  soap.   Wash thoroughly, paying special attention to the area where your surgery will be performed.  Thoroughly rinse your body with warm water from the neck down.  DO NOT shower/wash with your normal soap after using and rinsing off the CHG Soap.  Pat yourself dry with a CLEAN TOWEL.  Wear CLEAN PAJAMAS to bed the night before surgery  Place CLEAN SHEETS on your bed the night before your surgery  DO NOT SLEEP WITH PETS.   Day of Surgery: Shower with CHG soap. Do not wear jewelry, make up, nail polish, gel polish, artificial nails, or any other type of covering on natural nails including finger and  toenails. If patients have artificial nails, gel coating, etc. that need to be removed by a nail salon please have this removed prior to surgery. Surgery may need to be canceled/delayed if the surgeon/ anesthesia feels like the patient is unable to be adequately monitored. Do not wear lotions, powders, perfumes/colognes, or deodorant. Do not shave 48 hours prior to surgery.  Men may shave face and neck. Do not bring valuables to the hospital. [facility] is not responsible for any belongings or valuables. Wear Clean/Comfortable clothing the morning of surgery Remember to brush your teeth WITH YOUR REGULAR TOOTHPASTE.   Please read over the following fact sheets that you were given.

## 2020-08-06 ENCOUNTER — Encounter (HOSPITAL_COMMUNITY): Payer: Self-pay

## 2020-08-06 ENCOUNTER — Other Ambulatory Visit: Payer: Self-pay

## 2020-08-06 ENCOUNTER — Encounter (HOSPITAL_COMMUNITY)
Admission: RE | Admit: 2020-08-06 | Discharge: 2020-08-06 | Disposition: A | Discharge status: Home / Self Care | Payer: Medicaid Other | Source: Ambulatory Visit | Attending: General Surgery | Admitting: General Surgery

## 2020-08-06 DIAGNOSIS — G4733 Obstructive sleep apnea (adult) (pediatric): Secondary | ICD-10-CM | POA: Diagnosis not present

## 2020-08-06 DIAGNOSIS — Z01812 Encounter for preprocedural laboratory examination: Secondary | ICD-10-CM | POA: Insufficient documentation

## 2020-08-06 DIAGNOSIS — J439 Emphysema, unspecified: Secondary | ICD-10-CM | POA: Diagnosis not present

## 2020-08-06 DIAGNOSIS — I2510 Atherosclerotic heart disease of native coronary artery without angina pectoris: Secondary | ICD-10-CM | POA: Insufficient documentation

## 2020-08-06 DIAGNOSIS — E785 Hyperlipidemia, unspecified: Secondary | ICD-10-CM | POA: Insufficient documentation

## 2020-08-06 DIAGNOSIS — Z9981 Dependence on supplemental oxygen: Secondary | ICD-10-CM | POA: Diagnosis not present

## 2020-08-06 HISTORY — DX: Family history of other specified conditions: Z84.89

## 2020-08-06 HISTORY — DX: Dyspnea, unspecified: R06.00

## 2020-08-06 HISTORY — DX: Headache, unspecified: R51.9

## 2020-08-06 HISTORY — DX: Pneumonia, unspecified organism: J18.9

## 2020-08-06 HISTORY — DX: Sleep apnea, unspecified: G47.30

## 2020-08-06 HISTORY — DX: Gastro-esophageal reflux disease without esophagitis: K21.9

## 2020-08-06 LAB — BASIC METABOLIC PANEL
Anion gap: 9 (ref 5–15)
BUN: 10 mg/dL (ref 6–20)
CO2: 27 mmol/L (ref 22–32)
Calcium: 9.3 mg/dL (ref 8.9–10.3)
Chloride: 101 mmol/L (ref 98–111)
Creatinine, Ser: 1.21 mg/dL (ref 0.61–1.24)
GFR, Estimated: >60 mL/min (ref >60)
Glucose, Bld: 108 mg/dL — ABNORMAL HIGH (ref 70–99)
Potassium: 4.1 mmol/L (ref 3.5–5.1)
Sodium: 137 mmol/L (ref 135–145)

## 2020-08-06 LAB — CBC
HCT: 46.8 % (ref 39.0–52.0)
Hemoglobin: 15.7 g/dL (ref 13.0–17.0)
MCH: 30.9 pg (ref 26.0–34.0)
MCHC: 33.5 g/dL (ref 30.0–36.0)
MCV: 92.1 fL (ref 80.0–100.0)
Platelets: 307 10*3/uL (ref 150–400)
RBC: 5.08 MIL/uL (ref 4.22–5.81)
RDW: 11.9 % (ref 11.5–15.5)
WBC: 7.1 10*3/uL (ref 4.0–10.5)
nRBC: 0 % (ref 0.0–0.2)

## 2020-08-06 NOTE — Progress Notes (Addendum)
PCP - Dr. Shan Levans Cardiologist - Dr. Bryan Lemma  Chest x-ray - 04/17/20 EKG - 04/05/20 Stress Test - Denies ECHO - 02/12/20 Cardiac Cath - Denies  Sleep Study - Yes has OSA Does not sleep with his CPAP d/t valves in his lungs  COVID TEST- Not indicated  Anesthesia review: Yes has extensive history with emphysema and lung valves - Zephyr Endobronchial valve procedure at [facility] dev pneumothorax afterward  Patient denies shortness of breath, fever, cough and chest pain at PAT appointment   All instructions explained to the patient, with a verbal understanding of the material. Patient agrees to go over the instructions while at home for a better understanding.  The opportunity to ask questions was provided.

## 2020-08-06 NOTE — Progress Notes (Signed)
Surgical Instructions                 Your procedure is scheduled on Monday 08/11/20.               Report to [facility] Main Entrance "A" at 06:45 A.M., then check in with the Admitting office.             Call this number if you have problems the morning of surgery:             [phone]    If you have any questions prior to your surgery date call [phone]: Open Monday-Friday 8am-4pm                 Remember:             Do not eat after midnight the night before your surgery      You may drink clear liquids until 5:45am on the morning of surgery.  Clear liquids include: water, gatorade, sodas, clear tea, and black coffee.                       Take these medicines the morning of surgery with A SIP OF WATER              benzonatate (TESSALON)             Fluticasone-Umeclidin-Vilant (TRELEGY ELLIPTA)             ipratropium-albuterol (DUONEB)   Take these medicines if needed:  albuterol (VENTOLIN HFA) ipratropium-albuterol (DUONEB)    As of today, STOP taking any Aspirin (unless otherwise instructed by your surgeon) Aleve, Naproxen, Ibuprofen, Motrin, Advil, Goody's, BC's, all herbal medications, fish oil, and all vitamins.                     Do NOT Smoke (Tobacco/Vaping) or drink Alcohol 24 hours prior to your procedure.   If you use a CPAP at night, you may bring all equipment for your overnight stay.   Contacts, glasses, piercing's, hearing aid's, dentures or partials may not be worn into surgery, please bring cases for these belongings.   For patients admitted to the hospital, discharge time will be determined by your treatment team.   Patients discharged the day of surgery will not be allowed to drive home, and someone needs to stay with them for 24 hours.   ONLY 1 SUPPORT PERSON MAY BE PRESENT WHILE YOU ARE IN SURGERY. IF YOU ARE TO BE ADMITTED ONCE YOU ARE IN YOUR ROOM YOU WILL BE ALLOWED TWO (2) VISITORS.  Minor children may have two parents present. Special  consideration for safety and communication needs will be reviewed on a case by case basis.     Special instructions:   [place]- Preparing For Surgery   Before surgery, you can play an important role. Because skin is not sterile, your skin needs to be as free of germs as possible. You can reduce the number of germs on your skin by washing with CHG (chlorahexidine gluconate) Soap before surgery.  CHG is an antiseptic cleaner which kills germs and bonds with the skin to continue killing germs even after washing.     Oral Hygiene is also important to reduce your risk of infection.  Remember - BRUSH YOUR TEETH THE MORNING OF SURGERY WITH YOUR REGULAR TOOTHPASTE   Please do not use if you have an allergy to CHG or antibacterial soaps. If your skin  becomes reddened/irritated stop using the CHG. Do not shave (including legs and underarms) for at least 48 hours prior to first CHG shower. It is OK to shave your face.   Please follow these instructions carefully.                                                                                                                               Shower the NIGHT BEFORE SURGERY and the MORNING OF SURGERY   If you chose to wash your hair, wash your hair first as usual with your normal shampoo.   After you shampoo, rinse your hair and body thoroughly to remove the shampoo.   Use CHG Soap as you would any other liquid soap. You can apply CHG directly to the skin and wash gently with a scrungie or a clean washcloth.    Apply the CHG Soap to your body ONLY FROM THE NECK DOWN.  Do not use on open wounds or open sores. Avoid contact with your eyes, ears, mouth and genitals (private parts). Wash Face and genitals (private parts)  with your normal soap.   Wash thoroughly, paying special attention to the area where your surgery will be performed.   Thoroughly rinse your body with warm water from the neck down.   DO NOT shower/wash with your normal soap after using  and rinsing off the CHG Soap.   Pat yourself dry with a CLEAN TOWEL.   Wear CLEAN PAJAMAS to bed the night before surgery   Place CLEAN SHEETS on your bed the night before your surgery   DO NOT SLEEP WITH PETS.     Day of Surgery: Shower with CHG soap. Do not wear jewelry, make up, nail polish, gel polish, artificial nails, or any other type of covering on natural nails including finger and toenails. If patients have artificial nails, gel coating, etc. that need to be removed by a nail salon please have this removed prior to surgery. Surgery may need to be canceled/delayed if the surgeon/ anesthesia feels like the patient is unable to be adequately monitored. Do not wear lotions, powders, perfumes/colognes, or deodorant. Do not shave 48 hours prior to surgery.  Men may shave face and neck. Do not bring valuables to the hospital. [facility] is not responsible for any belongings or valuables. Wear Clean/Comfortable clothing the morning of surgery Remember to brush your teeth WITH YOUR REGULAR TOOTHPASTE.   Please read over the following fact sheets that you were given.                    Note Details  Author Virgie Dad, RN File Time 08/05/2020  9:43 AM  Author Type Registered Nurse Status Signed  Last Editor Virgie Dad, RN Service (none)  [facility] Acct # [id number] Admit Date 08/06/2020

## 2020-08-07 ENCOUNTER — Ambulatory Visit (HOSPITAL_COMMUNITY): Payer: Medicaid Other | Admitting: Physician Assistant

## 2020-08-07 ENCOUNTER — Encounter (HOSPITAL_COMMUNITY): Payer: Medicaid Other

## 2020-08-07 ENCOUNTER — Ambulatory Visit (HOSPITAL_COMMUNITY): Payer: Medicaid Other | Admitting: Certified Registered Nurse Anesthetist

## 2020-08-07 NOTE — Progress Notes (Signed)
Anesthesia Chart Review:  Former smoker (quit 2020) with severe COPD/bullous emphysema.  Recently underwent bronchoscopy with Zephyr endobronchial valve placement x 4 in the left upper lobe on 03/20/20 at [facility]. This was complicated by left-sided pneumothorax that was diagnosed by CXR almost immediately upon arrival to the PACU (was reporting chest discomfort at the time). Urgent left-sided pigtail catheter placement provided symptomatic relief though the subsequent chest x-rays did not reveal any appreciable change in the pneumothorax size, prompting placement of a second left-sided pigtail catheter, which led to further symptomatic relief and appropriate evacuation of the pneumothorax. The remainder of his hospital course was uneventful and the chest tubes were sequentially removed after clamping trials. He was discharged home on 3/25.  He is maintained on 2 L submental oxygen with activity and at night.  He has OSA on CPAP.  CPAP currently on hold due to recent surgery and pneumothorax.  Per notes, he is waiting for clearance to restart.  Last seen by his pulmonologist Dr. Shan Levans on 07/10/2020 for preop evaluation.  Per note, "COPD actually improved with pulmonary valve placement resulting in collapse of left upper lobe bullous blebs. The patient can be cleared for planned herniorrhaphy he can undergo general anesthesia note he does have sleep apnea he may need a CPAP device postop patient is going to call [facility] to make sure he can use the CPAP machine at this time I advised him use 1 to 2 L at night during sleep until he is cleared to use a CPAP machine for nocturnal oxygenation issues."  Last PFTs 02/2020 w/ FEV1/FVC 39% predicted, DLCO 32% predicted. CT scan 08/2019 w/ stable severe upper lobe emphysema. Echo 02/2020 WNL.  Follows with cardiology for management of HLD and family hx of premature CAD. Recent cardiac CT showed coronary calcium score of 4, low risk. Last seen 04/01/20, no changes, 6 month  followup recommended.   I reached out to [facility] to clarify whether patient is able to use CPAP/BiPAP at this point in setting of recent endobronchial valve placement and subsequent pneumothorax.  Per Dr. Carmie Kanner no contraindication for use of CPAP or BiPAP.  Preop labs reviewed, unremarkable.  EKG 04/05/2020: Sinus rhythm.  Rate 94.  Borderline RAD.  CT chest 05/06/2020 (Care Everywhere): IMPRESSION:   1. Patient status post left upper endobronchial valve placement with four  valves visualized. There is decreased atelectasis and improved aeration of  the left upper lobe, when compared to prior study.  2. Stable small pulmonary nodules up to 4 mm in size which may be infection  or inflammatory, however given the presences of emphysema, a 12 month  follow up is recommended per Fleischner guidelines  3. Interval removal of left chest tube with resolution of previously  indexed left pneumothorax.   TTE 02/12/2020: INTERPRETATION  NORMAL LEFT VENTRICULAR SYSTOLIC FUNCTION  NORMAL RIGHT VENTRICULAR SYSTOLIC FUNCTION  TRIVIAL REGURGITATION NOTED (See above)  NO VALVULAR STENOSIS  NO PRIOR ECHO FOR COMPARISON.   CT cardiac scoring 09/03/2019: IMPRESSION: Coronary calcium score of 4. This was 30 percentile for age and sex matched control. Calcium in proximal LAD.    [facility] Short Stay Center/Anesthesiology Phone [phone] 08/08/2020 1:11 PM

## 2020-08-08 DIAGNOSIS — Z20822 Contact with and (suspected) exposure to covid-19: Secondary | ICD-10-CM | POA: Diagnosis not present

## 2020-08-08 NOTE — Anesthesia Preprocedure Evaluation (Addendum)
Anesthesia Evaluation  Patient identified by MRN, date of birth, ID band Patient awake    Reviewed: Allergy & Precautions, NPO status , Patient's Chart, lab work & pertinent test results  Airway        Dental   Pulmonary asthma , sleep apnea and Continuous Positive Airway Pressure Ventilation , COPD (2L O2),  oxygen dependent, former smoker,  03/2020 endobronchial valve placement c/b left pneumothorax requiring 2 pigtails          Cardiovascular negative cardio ROS    CT chest 05/06/2020 (Care Everywhere): IMPRESSION:   1. Patient status post left upper endobronchial valve placement with four  valves visualized. There is decreased atelectasis and improved aeration of  the left upper lobe, when compared to prior study.  2. Stable small pulmonary nodules up to 4 mm in size which may be infection  or inflammatory, however given the presences of emphysema, a 12 month  follow up is recommended per Fleischner guidelines  3. Interval removal of left chest tube with resolution of previously  indexed left pneumothorax.  TTE 02/12/2020: INTERPRETATION  NORMAL LEFT VENTRICULAR SYSTOLIC FUNCTION  NORMAL RIGHT VENTRICULAR SYSTOLIC FUNCTION  TRIVIAL REGURGITATION NOTED (See above)  NO VALVULAR STENOSIS  NO PRIOR ECHO FOR COMPARISON.  CT cardiac scoring 09/03/2019: IMPRESSION: Coronary calcium score of 4. This was 32 percentile for age and sex matched control. Calcium in proximal LAD.    Neuro/Psych  Headaches, PSYCHIATRIC DISORDERS Anxiety    GI/Hepatic Neg liver ROS, GERD  ,  Endo/Other  negative endocrine ROS  Renal/GU negative Renal ROS  negative genitourinary   Musculoskeletal negative musculoskeletal ROS (+)   Abdominal   Peds  Hematology negative hematology ROS (+)   Anesthesia Other Findings Last PFTs 02/2020 w/ FEV1/FVC 39% predicted, DLCO 32% predicted. CT scan 08/2019 w/ stable severe upper lobe emphysema..    Reproductive/Obstetrics                            Anesthesia Physical Anesthesia Plan  ASA: 3  Anesthesia Plan: General   Post-op Pain Management:    Induction: Intravenous  PONV Risk Score and Plan: 2 and Midazolam, Dexamethasone and Ondansetron  Airway Management Planned: Oral ETT  Additional Equipment:   Intra-op Plan:   Post-operative Plan: Extubation in OR  Informed Consent: I have reviewed the patients History and Physical, chart, labs and discussed the procedure including the risks, benefits and alternatives for the proposed anesthesia with the patient or authorized representative who has indicated his/her understanding and acceptance.     Dental advisory given  Plan Discussed with: CRNA  Anesthesia Plan Comments: (Case cancelled in preop by Dr. Derrell Lolling for ongoing and worsening URI symptoms. )      Anesthesia Quick Evaluation

## 2020-08-11 ENCOUNTER — Ambulatory Visit (HOSPITAL_COMMUNITY)
Admission: RE | Admit: 2020-08-11 | Discharge: 2020-08-11 | Disposition: A | Discharge status: Home / Self Care | Payer: Medicaid Other | Attending: General Surgery | Admitting: General Surgery

## 2020-08-11 ENCOUNTER — Encounter (HOSPITAL_COMMUNITY): Payer: Self-pay | Admitting: General Surgery

## 2020-08-11 ENCOUNTER — Encounter (HOSPITAL_COMMUNITY)
Admission: RE | Disposition: A | Discharge status: Home / Self Care | Payer: Self-pay | Source: Home / Self Care | Attending: General Surgery

## 2020-08-11 DIAGNOSIS — Z9981 Dependence on supplemental oxygen: Secondary | ICD-10-CM | POA: Insufficient documentation

## 2020-08-11 DIAGNOSIS — K429 Umbilical hernia without obstruction or gangrene: Secondary | ICD-10-CM | POA: Insufficient documentation

## 2020-08-11 DIAGNOSIS — R0989 Other specified symptoms and signs involving the circulatory and respiratory systems: Secondary | ICD-10-CM | POA: Insufficient documentation

## 2020-08-11 DIAGNOSIS — Z87891 Personal history of nicotine dependence: Secondary | ICD-10-CM | POA: Diagnosis not present

## 2020-08-11 DIAGNOSIS — Z5309 Procedure and treatment not carried out because of other contraindication: Secondary | ICD-10-CM | POA: Insufficient documentation

## 2020-08-11 SURGERY — REPAIR, HERNIA, UMBILICAL, LAPAROSCOPIC
Anesthesia: General

## 2020-08-11 MED ORDER — LACTATED RINGERS IV SOLN
INTRAVENOUS | Status: DC
Start: 2020-08-11 — End: 2020-08-11

## 2020-08-11 MED ORDER — CHLORHEXIDINE GLUCONATE 0.12 % MT SOLN
OROMUCOSAL | Status: AC
  Filled 2020-08-11: qty 15

## 2020-08-11 MED ORDER — FENTANYL CITRATE (PF) 250 MCG/5ML IJ SOLN
INTRAMUSCULAR | Status: AC
  Filled 2020-08-11: qty 5

## 2020-08-11 MED ORDER — ACETAMINOPHEN 500 MG PO TABS
ORAL_TABLET | ORAL | Status: AC
  Filled 2020-08-11: qty 2

## 2020-08-11 MED ORDER — PROPOFOL 10 MG/ML IV BOLUS
INTRAVENOUS | Status: AC
  Filled 2020-08-11: qty 20

## 2020-08-11 MED ORDER — MIDAZOLAM HCL 2 MG/2ML IJ SOLN
INTRAMUSCULAR | Status: AC
  Filled 2020-08-11: qty 2

## 2020-08-11 MED ORDER — CHLORHEXIDINE GLUCONATE 0.12 % MT SOLN: 15.0000 mL | Freq: Once | OROMUCOSAL | Status: DC

## 2020-08-11 MED ORDER — ROCURONIUM BROMIDE 10 MG/ML (PF) SYRINGE
PREFILLED_SYRINGE | INTRAVENOUS | Status: AC
  Filled 2020-08-11: qty 10

## 2020-08-11 MED ORDER — CHLORHEXIDINE GLUCONATE CLOTH 2 % EX PADS: 6.0000 | MEDICATED_PAD | Freq: Once | CUTANEOUS | Status: DC

## 2020-08-11 MED ORDER — ACETAMINOPHEN 500 MG PO TABS: 1000.0000 mg | ORAL_TABLET | ORAL | Status: DC

## 2020-08-11 MED ORDER — SUCCINYLCHOLINE CHLORIDE 200 MG/10ML IV SOSY
PREFILLED_SYRINGE | INTRAVENOUS | Status: AC
  Filled 2020-08-11: qty 10

## 2020-08-11 MED ORDER — BUPIVACAINE-EPINEPHRINE (PF) 0.25% -1:200000 IJ SOLN
INTRAMUSCULAR | Status: AC
  Filled 2020-08-11: qty 60

## 2020-08-11 MED ORDER — ORAL CARE MOUTH RINSE: 15.0000 mL | Freq: Once | OROMUCOSAL | Status: DC

## 2020-08-11 MED ORDER — ONDANSETRON HCL 4 MG/2ML IJ SOLN
INTRAMUSCULAR | Status: AC
  Filled 2020-08-11: qty 2

## 2020-08-11 MED ORDER — DEXAMETHASONE SODIUM PHOSPHATE 10 MG/ML IJ SOLN
INTRAMUSCULAR | Status: AC
  Filled 2020-08-11: qty 1

## 2020-08-11 MED ORDER — CEFAZOLIN SODIUM-DEXTROSE 2-4 GM/100ML-% IV SOLN
INTRAVENOUS | Status: AC
  Filled 2020-08-11: qty 100

## 2020-08-11 MED ORDER — CEFAZOLIN SODIUM-DEXTROSE 2-4 GM/100ML-% IV SOLN: 2.0000 g | INTRAVENOUS | Status: DC

## 2020-08-11 NOTE — Progress Notes (Signed)
Pt with pulm symptoms today Had tested covid -.  D/w anesthesia and will cancel for pulm sx on an elective case Will plan on rescheduling in next 1-2weeks once pulm issues resolve. Pt is agreeable

## 2020-08-11 NOTE — Progress Notes (Signed)
Pt came in to Pre-op with congestion, cough, and shortness of breath. Pt had a negative COVID test on 08/08/20. Pt says symptoms have worsened over the weekend. Dr. Derrell Lolling at bedside. Surgery to be rescheduled with Dr. Derrell Lolling. Pt instructed to follow up with surgeon's office.

## 2020-08-12 ENCOUNTER — Encounter (HOSPITAL_COMMUNITY): Payer: Medicaid Other

## 2020-08-14 ENCOUNTER — Encounter (HOSPITAL_COMMUNITY): Payer: Medicaid Other

## 2020-08-19 ENCOUNTER — Encounter (HOSPITAL_COMMUNITY): Payer: Medicaid Other

## 2020-08-21 ENCOUNTER — Encounter (HOSPITAL_COMMUNITY): Payer: Medicaid Other

## 2020-08-25 ENCOUNTER — Other Ambulatory Visit: Payer: Self-pay

## 2020-08-25 MED ORDER — TRELEGY ELLIPTA 200-62.5-25 MCG/INH IN AEPB
1.0000 | INHALATION_SPRAY | Freq: Every day | RESPIRATORY_TRACT | 3 refills | Status: DC
  Filled 2020-08-25 – 2020-09-03 (×2): qty 60, 30d supply, fill #0
  Filled 2020-10-12: qty 60, 30d supply, fill #1

## 2020-08-25 MED ORDER — PROMETHAZINE-DM 6.25-15 MG/5ML PO SYRP
5.0000 mL | ORAL_SOLUTION | Freq: Four times a day (QID) | ORAL | 0 refills | Status: DC | PRN
  Filled 2020-08-25 – 2020-09-03 (×2): qty 240, 12d supply, fill #0

## 2020-08-26 ENCOUNTER — Encounter (HOSPITAL_COMMUNITY): Payer: Medicaid Other

## 2020-08-28 ENCOUNTER — Ambulatory Visit (HOSPITAL_COMMUNITY): Admission: RE | Admit: 2020-08-28 | Payer: Medicaid Other | Source: Home / Self Care | Admitting: General Surgery

## 2020-08-28 ENCOUNTER — Encounter (HOSPITAL_COMMUNITY): Admission: RE | Payer: Self-pay | Source: Home / Self Care

## 2020-08-28 ENCOUNTER — Encounter (HOSPITAL_COMMUNITY): Payer: Medicaid Other

## 2020-08-28 DIAGNOSIS — J449 Chronic obstructive pulmonary disease, unspecified: Secondary | ICD-10-CM | POA: Diagnosis not present

## 2020-08-28 DIAGNOSIS — J432 Centrilobular emphysema: Secondary | ICD-10-CM | POA: Diagnosis not present

## 2020-08-28 SURGERY — REPAIR, HERNIA, UMBILICAL, LAPAROSCOPIC
Anesthesia: General

## 2020-09-01 ENCOUNTER — Other Ambulatory Visit: Payer: Self-pay

## 2020-09-02 ENCOUNTER — Encounter (HOSPITAL_COMMUNITY): Payer: Medicaid Other

## 2020-09-03 ENCOUNTER — Other Ambulatory Visit: Payer: Self-pay

## 2020-09-04 ENCOUNTER — Encounter (HOSPITAL_COMMUNITY): Payer: Medicaid Other

## 2020-09-04 ENCOUNTER — Other Ambulatory Visit: Payer: Self-pay

## 2020-09-04 DIAGNOSIS — J432 Centrilobular emphysema: Secondary | ICD-10-CM | POA: Diagnosis not present

## 2020-09-04 DIAGNOSIS — J449 Chronic obstructive pulmonary disease, unspecified: Secondary | ICD-10-CM | POA: Diagnosis not present

## 2020-09-05 ENCOUNTER — Other Ambulatory Visit: Payer: Self-pay

## 2020-09-09 ENCOUNTER — Encounter (HOSPITAL_COMMUNITY): Payer: Medicaid Other

## 2020-09-09 ENCOUNTER — Other Ambulatory Visit: Payer: Self-pay

## 2020-09-11 ENCOUNTER — Encounter (HOSPITAL_COMMUNITY): Payer: Medicaid Other

## 2020-09-23 DIAGNOSIS — J432 Centrilobular emphysema: Secondary | ICD-10-CM | POA: Diagnosis not present

## 2020-09-23 DIAGNOSIS — R918 Other nonspecific abnormal finding of lung field: Secondary | ICD-10-CM | POA: Diagnosis not present

## 2020-09-23 DIAGNOSIS — J449 Chronic obstructive pulmonary disease, unspecified: Secondary | ICD-10-CM | POA: Diagnosis not present

## 2020-10-03 ENCOUNTER — Other Ambulatory Visit: Payer: Self-pay

## 2020-10-03 ENCOUNTER — Other Ambulatory Visit: Payer: Self-pay | Admitting: Family Medicine

## 2020-10-03 MED ORDER — PREDNISONE 20 MG PO TABS
20.0000 mg | ORAL_TABLET | Freq: Every day | ORAL | 0 refills | Status: DC
  Filled 2020-10-03: qty 5, 5d supply, fill #0

## 2020-10-04 DIAGNOSIS — J432 Centrilobular emphysema: Secondary | ICD-10-CM | POA: Diagnosis not present

## 2020-10-04 DIAGNOSIS — J449 Chronic obstructive pulmonary disease, unspecified: Secondary | ICD-10-CM | POA: Diagnosis not present

## 2020-10-12 MED FILL — Pantoprazole Sodium EC Tab 40 MG (Base Equiv): ORAL | 30 days supply | Qty: 30 | Fill #2 | Status: AC

## 2020-10-13 ENCOUNTER — Other Ambulatory Visit: Payer: Self-pay

## 2020-10-17 ENCOUNTER — Other Ambulatory Visit: Payer: Self-pay

## 2020-10-19 NOTE — Progress Notes (Addendum)
Established Patient Office Visit  Subjective:  Patient ID: Richard Fuentes, male    DOB: May 06, 1970  Age: 50 y.o. MRN: [id number] Virtual Visit via Video Note  I connected with Richard Fuentes 10/24/20 at4p by a video enabled telemedicine application and verified that I am speaking with the correct person using two identifiers.   Consent:  I discussed the limitations, risks, security and privacy concerns of performing an evaluation and management service by video visit and the availability of in person appointments. I also discussed with the patient that there may be a patient responsible charge related to this service. The patient expressed understanding and agreed to proceed.  Location of patient: Patient's at home  Location of provider: I am in my office  Persons participating in the televisit with the patient.   No one else on the call    History of Present Illness:     CC: Primary care follow-up visit  HPI Richard Fuentes presents for primary care follow-up visit patient has advanced COPD and has had bronchial valves placed in the left upper lobe at [facility] previously.  Unfortunately his left valves have migrated and his left upper lobe is reexpanding compressing his left lower lobe.  Associated with this he is having increased cough increased wheezing and increased shortness of breath.  He took a brief pulse prednisone which did not improve his status.  He would like refills on prednisone and his cough pills.  Also complains of increased scalp irritation.  He does have dermatology follow-up.  He also notes increased reflux symptoms as well.  Another issue is that he has an inguinal hernia these be repaired but the surgeon wants his teeth repaired first he would need to have all his teeth pulled sequentially.  Also [facility] wants to have the bronchial valves reposition that procedure might not occur until January.  All told he has multiple procedures and the need to be coordinated for him to  be able to accomplish his chronic health status.  Note this pt still has CPAP and is benefiting from therapy.  Pt still uses oxygen 1-2L at night with Cpap and it helps   Past Medical History:  Diagnosis Date   Anxiety    Asthma    COPD (chronic obstructive pulmonary disease) (HCC)    Severe centrilobular and paraseptal bullous emphysema; Gold stage D; FEV1 and DLCO<30%.;  Currently undergoing transplant evaluation   Dyspnea    Environmental allergies    Esophagitis    Family history of adverse reaction to anesthesia    Paternal aunt had trouble waking   GERD (gastroesophageal reflux disease)    Headache    Hx of migraines    OCD (obsessive compulsive disorder)    OCD (obsessive compulsive disorder)    Pneumonia    Pneumothorax on left 03/20/2020   Psoriasis    Sleep apnea    Urticaria     Past Surgical History:  Procedure Laterality Date   Endobronchial valve Left 03/20/2020   LUL at [facility]   KNEE SURGERY Right 2002   arthroscopy and cartilidge removed   KNEE SURGERY Left 1987   Ligaments removed and screw in place    Family History  Problem Relation Age of Onset   Multiple sclerosis Sister    Psoriasis Maternal Grandmother    Heart attack Mother        Late in life   Heart attack Father 16   CAD Father    Heart attack Paternal Grandfather 15  Sudden cardiac death   Sudden Cardiac Death Paternal Grandfather 82   Heart attack Maternal Uncle    CAD Maternal Uncle    Heart attack Cousin 67   Sudden Cardiac Death Cousin 6   Allergic rhinitis Neg Hx    Angioedema Neg Hx    Asthma Neg Hx    Eczema Neg Hx    Immunodeficiency Neg Hx    Urticaria Neg Hx     Social History   Socioeconomic History   Marital status: Significant Other    Spouse name: Not on file   Number of children: Not on file   Years of education: Not on file   Highest education level: Not on file  Occupational History   Not on file  Tobacco Use   Smoking status: Former     Packs/day: 1.00    Years: 30.00    Pack years: 30.00    Types: Cigarettes    Quit date: 01/19/2018    Years since quitting: 2.7   Smokeless tobacco: Never  Vaping Use   Vaping Use: Never used  Substance and Sexual Activity   Alcohol use: No   Drug use: No   Sexual activity: Not Currently  Other Topics Concern   Not on file  Social History Narrative   Not on file   Social Determinants of Health   Financial Resource Strain: Not on file  Food Insecurity: Not on file  Transportation Needs: Not on file  Physical Activity: Not on file  Stress: Not on file  Social Connections: Not on file  Intimate Partner Violence: Not on file    Outpatient Medications Prior to Visit  Medication Sig Dispense Refill   albuterol (VENTOLIN HFA) 108 (90 Base) MCG/ACT inhaler Inhale 2 puffs into the lungs every 6 (six) hours as needed for wheezing or shortness of breath. 8.5 g 1   desonide (DESOWEN) 0.05 % cream Apply 1 application topically 2 (two) times daily as needed (psoriasis).     EPINEPHrine 0.3 mg/0.3 mL IJ SOAJ injection Inject 0.3 mg into the muscle as needed for anaphylaxis.     Fluticasone-Umeclidin-Vilant (TRELEGY ELLIPTA) 200-62.5-25 MCG/INH AEPB INHALE 1 PUFF INTO THE LUNGS DAILY. 60 each 3   hydrocortisone 2.5 % cream Apply 1 application topically 2 (two) times daily.     ibuprofen (ADVIL) 200 MG tablet Take 600-800 mg by mouth every 6 (six) hours as needed for mild pain.     ipratropium-albuterol (DUONEB) 0.5-2.5 (3) MG/3ML SOLN Inhale 3 mLs into the lungs every 6 (six) hours as needed (shortness of breath).     levocetirizine (XYZAL) 5 MG tablet Take 1 tablet once daily as needed for runny nose or itchy eyes. 34 tablet 5   traZODone (DESYREL) 50 MG tablet Take 0.5-1 tablets (25-50 mg total) by mouth at bedtime as needed for sleep. 30 tablet 3   Vitamin D, Ergocalciferol, (DRISDOL) 1.25 MG (50000 UNIT) CAPS capsule TAKE 1 CAPSULE BY MOUTH ONCE A WEEK 8 capsule 0   benzonatate (TESSALON)  100 MG capsule TAKE 1 CAPSULE BY MOUTH EVERY 8 HOURS (Patient taking differently: Take 100 mg by mouth every 8 (eight) hours.) 42 capsule 0   pantoprazole (PROTONIX) 40 MG tablet TAKE 1 TABLET (40 MG TOTAL) BY MOUTH DAILY. (Patient taking differently: Take 40 mg by mouth daily.) 30 tablet 3   promethazine-dextromethorphan (PROMETHAZINE-DM) 6.25-15 MG/5ML syrup Take 5 mLs by mouth 4 (four) times daily as needed for cough. 240 mL 0   predniSONE (DELTASONE) 20 MG  tablet Take 1 tablet (20 mg total) by mouth daily with breakfast. (Patient not taking: Reported on 10/20/2020) 5 tablet 0   No facility-administered medications prior to visit.    Allergies  Allergen Reactions   Other Anaphylaxis    Wool   Shellfish Allergy     Unknown reaction    ROS Review of Systems  Constitutional: Negative.   HENT:  Positive for dental problem. Negative for ear pain, postnasal drip, rhinorrhea, sinus pressure, sore throat, trouble swallowing and voice change.   Eyes: Negative.   Respiratory:  Positive for cough, chest tightness, shortness of breath and wheezing. Negative for apnea, choking and stridor.   Cardiovascular:  Positive for chest pain. Negative for palpitations and leg swelling.  Gastrointestinal:  Positive for abdominal pain. Negative for abdominal distention, nausea and vomiting.  Genitourinary:  Positive for scrotal swelling.       Inguinal hernia  Musculoskeletal: Negative.  Negative for arthralgias and myalgias.  Skin: Negative.  Negative for rash.  Allergic/Immunologic: Negative.  Negative for environmental allergies and food allergies.  Neurological: Negative.  Negative for dizziness, syncope, weakness and headaches.  Hematological: Negative.  Negative for adenopathy. Does not bruise/bleed easily.  Psychiatric/Behavioral:  Positive for dysphoric mood. Negative for agitation and sleep disturbance. The patient is nervous/anxious.      Objective:    Physical Exam Patient seen in home he  is in no acute distress There were no vitals taken for this visit. Wt Readings from Last 3 Encounters:  08/06/20 194 lb 4.8 oz (88.1 kg)  07/15/20 198 lb 3.1 oz (89.9 kg)  07/11/20 197 lb 1.5 oz (89.4 kg)     Health Maintenance Due  Topic Date Due   Pneumococcal Vaccine 24-30 Years old (1 - PCV) Never done   COVID-19 Vaccine (3 - Booster for Pfizer series) 03/25/2020    There are no preventive care reminders to display for this patient.  Lab Results  Component Value Date   TSH 0.821 04/28/2010   Lab Results  Component Value Date   WBC 7.1 08/06/2020   HGB 15.7 08/06/2020   HCT 46.8 08/06/2020   MCV 92.1 08/06/2020   PLT 307 08/06/2020   Lab Results  Component Value Date   NA 137 08/06/2020   K 4.1 08/06/2020   CO2 27 08/06/2020   GLUCOSE 108 (H) 08/06/2020   BUN 10 08/06/2020   CREATININE 1.21 08/06/2020   BILITOT 0.3 08/02/2019   ALKPHOS 90 08/02/2019   AST 20 08/02/2019   ALT 18 08/02/2019   PROT 7.2 08/02/2019   ALBUMIN 4.3 08/02/2019   CALCIUM 9.3 08/06/2020   ANIONGAP 9 08/06/2020   Lab Results  Component Value Date   CHOL 240 (H) 12/07/2018   Lab Results  Component Value Date   HDL 50 12/07/2018   Lab Results  Component Value Date   LDLCALC 165 (H) 12/07/2018   Lab Results  Component Value Date   TRIG 139 12/07/2018   Lab Results  Component Value Date   CHOLHDL 4.8 12/07/2018   Lab Results  Component Value Date   HGBA1C 5.8 (H) 12/07/2018      Assessment & Plan:   Problem List Items Addressed This Visit       Respiratory   COPD with emphysema (HCC)GOLD D  (Chronic)    Gold COPD stage D status post bronchial valve placement left upper lobe now with valve migration resulting in left upper lobe reinflation and worsening of symptoms  Patient has follow-ups at  [facility] for valve replacements  Plan to increase proton pump inhibitor dosing to Nexium 40 mg twice daily  Plan to refill Tessalon and repulsed prednisone continue Trelegy as  prescribed continue nebulizers as prescribed      Relevant Medications   benzonatate (TESSALON) 100 MG capsule   predniSONE (DELTASONE) 20 MG tablet   OSA (obstructive sleep apnea)    Pt still on CPAP and is benefiting from therapy.  He is tolerating cpap better since bronchial valves have been placed Pt still uses 1-2L oxygen at night with cpap and benefits        Digestive   Chronic periodontal disease    Severe periodontal disease patient advised to follow-up with his dentist on this        Musculoskeletal and Integument   Plaque psoriasis    Worsening psoriasis on the scalp and face advised patient to follow-up with dermatology        Other   Hyperlipidemia with target LDL less than 100 (Chronic)    Continue current lipid therapy      Inguinal hernia with obstruction without gangrene    I will communicate with the patient's surgeon to see if the surgery can occur despite not having his teeth fully addressed       Meds ordered this encounter  Medications   benzonatate (TESSALON) 100 MG capsule    Sig: Take 1 capsule (100 mg total) by mouth every 8 (eight) hours.    Dispense:  90 capsule    Refill:  2   predniSONE (DELTASONE) 20 MG tablet    Sig: Take 4 tablets daily for 5 days then stop    Dispense:  20 tablet    Refill:  0   esomeprazole (NEXIUM) 40 MG capsule    Sig: Take 1 capsule (40 mg total) by mouth 2 (two) times daily before a meal.    Dispense:  60 capsule    Refill:  3    Follow-up: Return in about 2 months (around 12/20/2020).   Follow Up Instructions: Patient knows a direct exam will occur in the next 2 months and refills on his Tessalon increase in proton pump inhibitor dosing and pulse prednisone sent to his [facility]   I discussed the assessment and treatment plan with the patient. The patient was provided an opportunity to ask questions and all were answered. The patient agreed with the plan and demonstrated an understanding of  the instructions.   The patient was advised to call back or seek an in-person evaluation if the symptoms worsen or if the condition fails to improve as anticipated.  I provided of non-face-to-face time during this encounter  including  median intraservice time , review of notes, labs, imaging, medications  and explaining diagnosis and management to the patient .     Shan Levans, MD

## 2020-10-20 ENCOUNTER — Ambulatory Visit: Payer: Medicaid Other | Attending: Critical Care Medicine | Admitting: Critical Care Medicine

## 2020-10-20 ENCOUNTER — Encounter: Payer: Self-pay | Admitting: Critical Care Medicine

## 2020-10-20 ENCOUNTER — Other Ambulatory Visit: Payer: Self-pay

## 2020-10-20 DIAGNOSIS — K4031 Unilateral inguinal hernia, with obstruction, without gangrene, recurrent: Secondary | ICD-10-CM

## 2020-10-20 DIAGNOSIS — Z09 Encounter for follow-up examination after completed treatment for conditions other than malignant neoplasm: Secondary | ICD-10-CM | POA: Diagnosis not present

## 2020-10-20 DIAGNOSIS — Z9981 Dependence on supplemental oxygen: Secondary | ICD-10-CM | POA: Diagnosis not present

## 2020-10-20 DIAGNOSIS — L400 Psoriasis vulgaris: Secondary | ICD-10-CM | POA: Insufficient documentation

## 2020-10-20 DIAGNOSIS — J431 Panlobular emphysema: Secondary | ICD-10-CM | POA: Diagnosis not present

## 2020-10-20 DIAGNOSIS — G4733 Obstructive sleep apnea (adult) (pediatric): Secondary | ICD-10-CM | POA: Insufficient documentation

## 2020-10-20 DIAGNOSIS — K4030 Unilateral inguinal hernia, with obstruction, without gangrene, not specified as recurrent: Secondary | ICD-10-CM | POA: Diagnosis not present

## 2020-10-20 DIAGNOSIS — E785 Hyperlipidemia, unspecified: Secondary | ICD-10-CM | POA: Diagnosis not present

## 2020-10-20 DIAGNOSIS — Z79899 Other long term (current) drug therapy: Secondary | ICD-10-CM | POA: Diagnosis not present

## 2020-10-20 DIAGNOSIS — Z87891 Personal history of nicotine dependence: Secondary | ICD-10-CM | POA: Diagnosis not present

## 2020-10-20 DIAGNOSIS — K056 Periodontal disease, unspecified: Secondary | ICD-10-CM | POA: Diagnosis not present

## 2020-10-20 DIAGNOSIS — J432 Centrilobular emphysema: Secondary | ICD-10-CM | POA: Diagnosis not present

## 2020-10-20 MED ORDER — BENZONATATE 100 MG PO CAPS: 100.0000 mg | ORAL_CAPSULE | Freq: Three times a day (TID) | ORAL | 2 refills | Status: DC

## 2020-10-20 MED ORDER — PREDNISONE 20 MG PO TABS: ORAL_TABLET | ORAL | 0 refills | Status: DC

## 2020-10-20 MED ORDER — ESOMEPRAZOLE MAGNESIUM 40 MG PO CPDR: 40.0000 mg | DELAYED_RELEASE_CAPSULE | Freq: Two times a day (BID) | ORAL | 3 refills | Status: DC

## 2020-10-20 NOTE — Assessment & Plan Note (Signed)
I will communicate with the patient's surgeon to see if the surgery can occur despite not having his teeth fully addressed

## 2020-10-20 NOTE — Assessment & Plan Note (Signed)
Worsening psoriasis on the scalp and face advised patient to follow-up with dermatology

## 2020-10-20 NOTE — Assessment & Plan Note (Signed)
Gold COPD stage D status post bronchial valve placement left upper lobe now with valve migration resulting in left upper lobe reinflation and worsening of symptoms  Patient has follow-ups at [facility] for valve replacements  Plan to increase proton pump inhibitor dosing to Nexium 40 mg twice daily  Plan to refill Tessalon and repulsed prednisone continue Trelegy as prescribed continue nebulizers as prescribed

## 2020-10-20 NOTE — Assessment & Plan Note (Signed)
Severe periodontal disease patient advised to follow-up with his dentist on this

## 2020-10-20 NOTE — Assessment & Plan Note (Signed)
Continue current lipid therapy °

## 2020-10-24 NOTE — Assessment & Plan Note (Addendum)
Pt still on CPAP and is benefiting from therapy.  He is tolerating cpap better since bronchial valves have been placed Pt still uses 1-2L oxygen at night with cpap and benefits

## 2020-11-04 DIAGNOSIS — J449 Chronic obstructive pulmonary disease, unspecified: Secondary | ICD-10-CM | POA: Diagnosis not present

## 2020-11-04 DIAGNOSIS — J432 Centrilobular emphysema: Secondary | ICD-10-CM | POA: Diagnosis not present

## 2020-11-11 ENCOUNTER — Other Ambulatory Visit (HOSPITAL_COMMUNITY): Payer: Medicaid Other

## 2020-11-19 ENCOUNTER — Ambulatory Visit: Admit: 2020-11-19 | Payer: Medicaid Other | Admitting: General Surgery

## 2020-11-19 ENCOUNTER — Other Ambulatory Visit: Payer: Self-pay | Admitting: Pharmacist

## 2020-11-19 ENCOUNTER — Other Ambulatory Visit: Payer: Self-pay

## 2020-11-19 SURGERY — REPAIR, HERNIA, UMBILICAL, LAPAROSCOPIC
Anesthesia: General

## 2020-11-19 MED ORDER — TRELEGY ELLIPTA 100-62.5-25 MCG/ACT IN AEPB
1.0000 | INHALATION_SPRAY | Freq: Every day | RESPIRATORY_TRACT | 11 refills | Status: DC
  Filled 2020-11-19 – 2020-11-20 (×2): qty 60, 30d supply, fill #0

## 2020-11-20 ENCOUNTER — Other Ambulatory Visit: Payer: Self-pay

## 2020-11-20 MED ORDER — HALOBETASOL PROPIONATE 0.05 % EX CREA: TOPICAL_CREAM | Freq: Two times a day (BID) | CUTANEOUS | 0 refills | Status: DC

## 2020-11-20 MED ORDER — CALCIPOTRIENE 0.005 % EX CREA: TOPICAL_CREAM | Freq: Two times a day (BID) | CUTANEOUS | 0 refills | Status: DC

## 2020-11-24 ENCOUNTER — Other Ambulatory Visit: Payer: Self-pay

## 2020-11-25 ENCOUNTER — Other Ambulatory Visit: Payer: Self-pay

## 2020-11-25 MED ORDER — CALCIPOTRIENE 0.005 % EX CREA
TOPICAL_CREAM | Freq: Two times a day (BID) | CUTANEOUS | 0 refills | Status: DC
  Filled 2020-11-25: qty 60, 20d supply, fill #0
  Filled 2020-11-25 (×2): qty 60, 30d supply, fill #0
  Filled 2020-12-01: qty 60, 14d supply, fill #0

## 2020-11-25 NOTE — Addendum Note (Signed)
Addended by: Storm Frisk on: 11/25/2020 05:22 AM   Modules accepted: Orders

## 2020-11-26 ENCOUNTER — Other Ambulatory Visit: Payer: Self-pay

## 2020-11-30 ENCOUNTER — Encounter (HOSPITAL_BASED_OUTPATIENT_CLINIC_OR_DEPARTMENT_OTHER): Payer: Self-pay

## 2020-11-30 ENCOUNTER — Other Ambulatory Visit: Payer: Self-pay

## 2020-11-30 ENCOUNTER — Emergency Department (HOSPITAL_BASED_OUTPATIENT_CLINIC_OR_DEPARTMENT_OTHER): Payer: Medicaid Other

## 2020-11-30 ENCOUNTER — Emergency Department (HOSPITAL_BASED_OUTPATIENT_CLINIC_OR_DEPARTMENT_OTHER)
Admission: EM | Admit: 2020-11-30 | Discharge: 2020-12-01 | Disposition: A | Discharge status: Home / Self Care | Payer: Medicaid Other | Attending: Emergency Medicine | Admitting: Emergency Medicine

## 2020-11-30 DIAGNOSIS — J439 Emphysema, unspecified: Secondary | ICD-10-CM | POA: Diagnosis not present

## 2020-11-30 DIAGNOSIS — R0602 Shortness of breath: Secondary | ICD-10-CM | POA: Diagnosis not present

## 2020-11-30 DIAGNOSIS — R000 Tachycardia, unspecified: Secondary | ICD-10-CM | POA: Diagnosis not present

## 2020-11-30 DIAGNOSIS — Z87891 Personal history of nicotine dependence: Secondary | ICD-10-CM | POA: Diagnosis not present

## 2020-11-30 DIAGNOSIS — J45909 Unspecified asthma, uncomplicated: Secondary | ICD-10-CM | POA: Diagnosis not present

## 2020-11-30 DIAGNOSIS — J449 Chronic obstructive pulmonary disease, unspecified: Secondary | ICD-10-CM | POA: Diagnosis not present

## 2020-11-30 DIAGNOSIS — Z7951 Long term (current) use of inhaled steroids: Secondary | ICD-10-CM | POA: Insufficient documentation

## 2020-11-30 DIAGNOSIS — U071 COVID-19: Secondary | ICD-10-CM | POA: Diagnosis not present

## 2020-11-30 LAB — CBC WITH DIFFERENTIAL/PLATELET
Abs Immature Granulocytes: 0.07 10*3/uL (ref 0.00–0.07)
Basophils Absolute: 0 10*3/uL (ref 0.0–0.1)
Basophils Relative: 0 %
Eosinophils Absolute: 0 10*3/uL (ref 0.0–0.5)
Eosinophils Relative: 0 %
HCT: 42.1 % (ref 39.0–52.0)
Hemoglobin: 14.5 g/dL (ref 13.0–17.0)
Immature Granulocytes: 1 %
Lymphocytes Relative: 12 %
Lymphs Abs: 1.3 10*3/uL (ref 0.7–4.0)
MCH: 30.8 pg (ref 26.0–34.0)
MCHC: 34.4 g/dL (ref 30.0–36.0)
MCV: 89.4 fL (ref 80.0–100.0)
Monocytes Absolute: 1.2 10*3/uL — ABNORMAL HIGH (ref 0.1–1.0)
Monocytes Relative: 12 %
Neutro Abs: 8 10*3/uL — ABNORMAL HIGH (ref 1.7–7.7)
Neutrophils Relative %: 75 %
Platelets: 284 10*3/uL (ref 150–400)
RBC: 4.71 MIL/uL (ref 4.22–5.81)
RDW: 11.8 % (ref 11.5–15.5)
WBC: 10.7 10*3/uL — ABNORMAL HIGH (ref 4.0–10.5)
nRBC: 0 % (ref 0.0–0.2)

## 2020-11-30 MED ORDER — DEXAMETHASONE SODIUM PHOSPHATE 10 MG/ML IJ SOLN
10.0000 mg | Freq: Once | INTRAMUSCULAR | Status: DC
  Filled 2020-11-30: qty 1

## 2020-11-30 MED ORDER — ALBUTEROL SULFATE HFA 108 (90 BASE) MCG/ACT IN AERS
4.0000 | INHALATION_SPRAY | Freq: Once | RESPIRATORY_TRACT | Status: AC
  Administered 2020-11-30: 4 via RESPIRATORY_TRACT
  Filled 2020-11-30: qty 6.7

## 2020-11-30 NOTE — ED Provider Notes (Signed)
[facility] EMERGENCY DEPT Provider Note   CSN: [id number] Arrival date & time: 11/30/20  2246     History Chief Complaint  Patient presents with   Shortness of Breath    Richard Fuentes is a 50 y.o. male.  HPI     Is a 50 year old male with a history of anxiety, OCD, asthma, COPD who presents with shortness of breath.  Patient reports that he at baseline has fairly significant shortness of breath.  He has a history of severe emphysema and has had bronchopulmonary valves placed.  He states that they have migrated and are not working.  He states over the last 24 hours he has had worsening shortness of breath.  He took 2 COVID tests at home which were positive.  This made him very anxious because he lives alone.  His O2 sats have been 90 to 96% per the patient.  He is not had any fevers or other symptoms.  He is vaccinated against COVID-19 and contracted COVID-19 early in the pandemic.  He just recently finished a course of steroids.  Past Medical History:  Diagnosis Date   Anxiety    Asthma    COPD (chronic obstructive pulmonary disease) (HCC)    Severe centrilobular and paraseptal bullous emphysema; Gold stage D; FEV1 and DLCO<30%.;  Currently undergoing transplant evaluation   Dyspnea    Environmental allergies    Esophagitis    Family history of adverse reaction to anesthesia    Paternal aunt had trouble waking   GERD (gastroesophageal reflux disease)    Headache    Hx of migraines    OCD (obsessive compulsive disorder)    OCD (obsessive compulsive disorder)    Pneumonia    Pneumothorax on left 03/20/2020   Psoriasis    Sleep apnea    Urticaria     Patient Active Problem List   Diagnosis Date Noted   Allergic contact dermatitis due to metals 01/07/2020   OSA (obstructive sleep apnea) 09/13/2019   Preoperative cardiovascular examination 08/22/2019   Hyperlipidemia with target LDL less than 100 08/20/2019   Family history of premature coronary artery  disease 08/20/2019   Anxiety 08/01/2019   Perennial and seasonal allergic rhinitis 07/24/2019   Recurrent urticaria 07/24/2019   History of penicillin allergy 07/24/2019   History of food allergy 07/24/2019   Plaque psoriasis 06/05/2019   Chronic periodontal disease 03/21/2019   COPD with emphysema (HCC)GOLD D  12/07/2017   Inguinal hernia with obstruction without gangrene 02/04/2017   Generalized anxiety disorder 12/07/2011   History of tobacco use 12/07/2011    Past Surgical History:  Procedure Laterality Date   Endobronchial valve Left 03/20/2020   LUL at [place] Right 2002   arthroscopy and cartilidge removed   KNEE SURGERY Left 1987   Ligaments removed and screw in place       Family History  Problem Relation Age of Onset   Multiple sclerosis Sister    Psoriasis Maternal Grandmother    Heart attack Mother        Late in life   Heart attack Father 21   CAD Father    Heart attack Paternal Grandfather 74       Sudden cardiac death   Sudden Cardiac Death Paternal Grandfather 68   Heart attack Maternal Uncle    CAD Maternal Uncle    Heart attack Cousin 39   Sudden Cardiac Death Cousin 79   Allergic rhinitis Neg Hx    Angioedema Neg  Hx    Asthma Neg Hx    Eczema Neg Hx    Immunodeficiency Neg Hx    Urticaria Neg Hx     Social History   Tobacco Use   Smoking status: Former    Packs/day: 1.00    Years: 30.00    Pack years: 30.00    Types: Cigarettes    Quit date: 01/19/2018    Years since quitting: 2.8   Smokeless tobacco: Never  Vaping Use   Vaping Use: Never used  Substance Use Topics   Alcohol use: No   Drug use: No    Home Medications Prior to Admission medications   Medication Sig Start Date End Date Taking? Authorizing Provider  nirmatrelvir/ritonavir EUA (PAXLOVID) 20 x 150 MG & 10 x 100MG  TABS Take 3 tablets by mouth 2 (two) times daily for 5 days. Patient GFR is >60. Take nirmatrelvir (150 mg) two tablets twice daily for 5 days  and ritonavir (100 mg) one tablet twice daily for 5 days. 12/01/20 12/06/20 Yes Chandon Lazcano, Barbette Hair, MD  predniSONE (DELTASONE) 20 MG tablet Take 2 tablets (40 mg total) by mouth daily. 12/01/20  Yes Reggie Welge, Barbette Hair, MD  albuterol (VENTOLIN HFA) 108 (90 Base) MCG/ACT inhaler Inhale 2 puffs into the lungs every 6 (six) hours as needed for wheezing or shortness of breath. 04/22/20   Elsie Stain, MD  benzonatate (TESSALON) 100 MG capsule Take 1 capsule (100 mg total) by mouth every 8 (eight) hours. 10/20/20 10/20/21  Elsie Stain, MD  calcipotriene (DOVONOX) 0.005 % cream Apply topically 2 (two) times daily. 11/25/20   Elsie Stain, MD  desonide (DESOWEN) 0.05 % cream Apply 1 application topically 2 (two) times daily as needed (psoriasis).    [provider]  EPINEPHrine 0.3 mg/0.3 mL IJ SOAJ injection Inject 0.3 mg into the muscle as needed for anaphylaxis.    [provider]  esomeprazole (NEXIUM) 40 MG capsule Take 1 capsule (40 mg total) by mouth 2 (two) times daily before a meal. 10/20/20   Elsie Stain, MD  Fluticasone-Umeclidin-Vilant (TRELEGY ELLIPTA) 100-62.5-25 MCG/ACT AEPB Inhale 1 puff into the lungs daily. 11/19/20   Elsie Stain, MD  halobetasol (ULTRAVATE) 0.05 % cream Apply topically 2 (two) times daily. 11/20/20   Elsie Stain, MD  hydrocortisone 2.5 % cream Apply 1 application topically 2 (two) times daily as needed (psoriasis). 05/20/20   [provider]  ibuprofen (ADVIL) 200 MG tablet Take 600-800 mg by mouth every 6 (six) hours as needed for mild pain.    [provider]  ipratropium-albuterol (DUONEB) 0.5-2.5 (3) MG/3ML SOLN Inhale 3 mLs into the lungs every 6 (six) hours as needed (shortness of breath).    [provider]  Multiple Vitamin (MULTIVITAMIN WITH MINERALS) TABS tablet Take 1 tablet by mouth daily.    [provider]  traZODone (DESYREL) 50 MG tablet Take 0.5-1 tablets (25-50 mg total) by  mouth at bedtime as needed for sleep. Patient not taking: Reported on 11/05/2020 07/10/20   Elsie Stain, MD  clonazePAM (KLONOPIN) 0.5 MG tablet Take 1 tablet (0.5 mg total) by mouth 2 (two) times daily as needed for anxiety. 08/13/12 07/10/20  Moreno-Coll, Adlih, MD    Allergies    Other and Shellfish allergy  Review of Systems   Review of Systems  Constitutional:  Negative for fever.  Respiratory:  Positive for cough, shortness of breath and wheezing.   Cardiovascular:  Negative for chest pain.  Gastrointestinal:  Negative for abdominal pain, diarrhea, nausea and vomiting.  Genitourinary:  Negative for dysuria.  All other systems reviewed and are negative.  Physical Exam Updated Vital Signs BP 133/84 (BP Location: Right Arm)   Pulse 98   Temp 98.5 F (36.9 C) (Oral)   Resp 15   SpO2 96%   Physical Exam Vitals and nursing note reviewed.  Constitutional:      Appearance: He is well-developed.     Comments: Anxious appearing, nontoxic  HENT:     Head: Normocephalic and atraumatic.     Mouth/Throat:     Mouth: Mucous membranes are moist.  Eyes:     Pupils: Pupils are equal, round, and reactive to light.  Cardiovascular:     Rate and Rhythm: Regular rhythm. Tachycardia present.     Heart sounds: Normal heart sounds. No murmur heard. Pulmonary:     Effort: Pulmonary effort is normal. No respiratory distress.     Breath sounds: Normal breath sounds. No wheezing.     Comments: Diminished breath sounds in all lung fields, occasional expiratory wheeze, fair air movement Abdominal:     General: Bowel sounds are normal.     Palpations: Abdomen is soft.     Tenderness: There is no abdominal tenderness. There is no rebound.  Musculoskeletal:     Cervical back: Neck supple.     Right lower leg: No tenderness. No edema.     Left lower leg: No tenderness. No edema.  Skin:    General: Skin is warm and dry.  Neurological:     Mental Status: He is alert and oriented to person,  place, and time.  Psychiatric:        Mood and Affect: Mood is anxious.    ED Results / Procedures / Treatments   Labs (all labs ordered are listed, but only abnormal results are displayed) Labs Reviewed  CBC WITH DIFFERENTIAL/PLATELET - Abnormal; Notable for the following components:      Result Value   WBC 10.7 (*)    Neutro Abs 8.0 (*)    Monocytes Absolute 1.2 (*)    All other components within normal limits  COMPREHENSIVE METABOLIC PANEL - Abnormal; Notable for the following components:   Potassium 3.3 (*)    Glucose, Bld 112 (*)    All other components within normal limits    EKG EKG Interpretation  Date/Time:  Sunday November 30 2020 23:01:51 EST Ventricular Rate:  105 PR Interval:  126 QRS Duration: 86 QT Interval:  326 QTC Calculation: 431 R Axis:   90 Text Interpretation: Sinus tachycardia Right atrial enlargement Borderline right axis deviation Confirmed by Ross Marcus ([id number]) on 11/30/2020 11:56:17 PM  Radiology DG Chest Portable 1 View  Result Date: 11/30/2020 CLINICAL DATA:  Shortness of breath. EXAM: PORTABLE CHEST 1 VIEW COMPARISON:  Chest radiograph dated 04/16/2020. FINDINGS: Emphysema. No focal consolidation, pleural effusion or pneumothorax. Cardiac silhouette is within limits. No acute osseous pathology. IMPRESSION: 1. No active disease. 2. Emphysema. Electronically Signed   By: Elgie Collard M.D.   On: 11/30/2020 23:36    Procedures Procedures   Medications Ordered in ED Medications  dexamethasone (DECADRON) injection 10 mg (10 mg Intramuscular Patient Refused/Not Given 11/30/20 2359)  albuterol (VENTOLIN HFA) 108 (90 Base) MCG/ACT inhaler 4 puff (4 puffs Inhalation Given 11/30/20 2335)    ED Course  I have reviewed the triage vital signs and the nursing notes.  Pertinent labs & imaging results that were available during my care of  the patient were reviewed by me and considered in my medical decision making (see chart for  details).  Clinical Course as of 12/01/20 [id number]  Nancy Fetter Nov 30, 2020  2356 Patient declined Decadron.  States he is very anxious about taking this medication.  Prefers prednisone.  Decadron specifically ordered given COVID positivity. [CH]    Clinical Course User Index [CH] Kenzley Ke, Barbette Hair, MD   MDM Rules/Calculators/A&P                           Patient presents with shortness of breath.  Significant history of emphysema.  Positive COVID test at home.  He showed me pictures of 2 positive COVID test.  He is nontoxic.  Vital signs are reassuring including pulse ox of 96%.  He is significantly anxious appearing and has expressed anxiety over his symptoms.  I have tried to reassure him.  His breath sounds are fairly clear with an occasional wheeze.  He was given an inhaler.  I offered him Decadron which she refused.  Chest x-ray shows no evidence of pneumothorax or pneumonia.  EKG without acute ischemic or arrhythmic changes.  Presentation not consistent with ACS.  We discussed the risk and benefits of Paxlovid.  Patient would like a prescription.  This was sent to his local pharmacy.  He will be given a burst dose of prednisone.  Recommend follow-up with primary physician.  Patient was able to ambulate in the room and maintain his pulse ox 95 to 97% on room air.  After history, exam, and medical workup I feel the patient has been appropriately medically screened and is safe for discharge home. Pertinent diagnoses were discussed with the patient. Patient was given return precautions.  Final Clinical Impression(s) / ED Diagnoses Final diagnoses:  COVID-19    Rx / DC Orders ED Discharge Orders          Ordered    nirmatrelvir/ritonavir EUA (PAXLOVID) 20 x 150 MG & 10 x 100MG  TABS  2 times daily        12/01/20 0025    predniSONE (DELTASONE) 20 MG tablet  Daily        12/01/20 0025             Merryl Hacker, MD 12/01/20 0028

## 2020-11-30 NOTE — ED Triage Notes (Signed)
Pt reports he's having trouble breathing, two positive covid test. Pt reports he lives alone, stage 4 emphysema as well as valve replacement

## 2020-12-01 ENCOUNTER — Encounter: Payer: Self-pay | Admitting: Critical Care Medicine

## 2020-12-01 ENCOUNTER — Other Ambulatory Visit (HOSPITAL_BASED_OUTPATIENT_CLINIC_OR_DEPARTMENT_OTHER): Payer: Self-pay

## 2020-12-01 ENCOUNTER — Other Ambulatory Visit: Payer: Self-pay

## 2020-12-01 LAB — COMPREHENSIVE METABOLIC PANEL
ALT: 17 U/L (ref 0–44)
AST: 18 U/L (ref 15–41)
Albumin: 4 g/dL (ref 3.5–5.0)
Alkaline Phosphatase: 65 U/L (ref 38–126)
Anion gap: 10 (ref 5–15)
BUN: 15 mg/dL (ref 6–20)
CO2: 25 mmol/L (ref 22–32)
Calcium: 9.6 mg/dL (ref 8.9–10.3)
Chloride: 102 mmol/L (ref 98–111)
Creatinine, Ser: 1.04 mg/dL (ref 0.61–1.24)
GFR, Estimated: >60 mL/min (ref >60)
Glucose, Bld: 112 mg/dL — ABNORMAL HIGH (ref 70–99)
Potassium: 3.3 mmol/L — ABNORMAL LOW (ref 3.5–5.1)
Sodium: 137 mmol/L (ref 135–145)
Total Bilirubin: 0.3 mg/dL (ref 0.3–1.2)
Total Protein: 6.7 g/dL (ref 6.5–8.1)

## 2020-12-01 MED ORDER — NIRMATRELVIR/RITONAVIR (PAXLOVID)TABLET
3.0000 | ORAL_TABLET | Freq: Two times a day (BID) | ORAL | 0 refills | Status: AC
Start: 2020-12-01 — End: 2020-12-06
  Filled 2020-12-01 (×2): qty 30, 5d supply, fill #0

## 2020-12-01 MED ORDER — TRELEGY ELLIPTA 200-62.5-25 MCG/ACT IN AEPB
1.0000 | INHALATION_SPRAY | Freq: Every day | RESPIRATORY_TRACT | 4 refills | Status: DC
  Filled 2020-12-01: qty 60, 30d supply, fill #0

## 2020-12-01 MED ORDER — TRELEGY ELLIPTA 200-62.5-25 MCG/ACT IN AEPB
1.0000 | INHALATION_SPRAY | Freq: Every day | RESPIRATORY_TRACT | 4 refills | Status: DC
  Filled 2020-12-01: qty 60, 30d supply, fill #0
  Filled 2021-01-27: qty 60, 30d supply, fill #1
  Filled 2021-03-02 – 2021-03-09 (×2): qty 60, 30d supply, fill #2

## 2020-12-01 MED ORDER — PREDNISONE 20 MG PO TABS
40.0000 mg | ORAL_TABLET | Freq: Every day | ORAL | 0 refills | Status: DC
  Filled 2020-12-01 (×2): qty 10, 5d supply, fill #0

## 2020-12-01 NOTE — Telephone Encounter (Signed)
Add this pt on tomorrow as video visit ok to double book.  Pos Covid /COPD

## 2020-12-01 NOTE — ED Notes (Signed)
Ambulating pulse ox was between 95-97% on RA.

## 2020-12-01 NOTE — Addendum Note (Signed)
Addended by: Storm Frisk on: 12/01/2020 09:39 AM   Modules accepted: Orders

## 2020-12-01 NOTE — Discharge Instructions (Signed)
Continue your inhaler as needed.  Start Paxlovid.  You need to isolate yourself until you are 5 days from positive test and symptom-free.

## 2020-12-02 ENCOUNTER — Other Ambulatory Visit (HOSPITAL_BASED_OUTPATIENT_CLINIC_OR_DEPARTMENT_OTHER): Payer: Self-pay

## 2020-12-02 NOTE — Telephone Encounter (Signed)
Pt called stating that he was supposed to have a video visit scheduled for today. No appts available. Please advise.

## 2020-12-04 ENCOUNTER — Telehealth (INDEPENDENT_AMBULATORY_CARE_PROVIDER_SITE_OTHER): Payer: Medicaid Other | Admitting: Nurse Practitioner

## 2020-12-04 ENCOUNTER — Other Ambulatory Visit: Payer: Self-pay

## 2020-12-04 ENCOUNTER — Encounter: Payer: Self-pay | Admitting: Nurse Practitioner

## 2020-12-04 DIAGNOSIS — U071 COVID-19: Secondary | ICD-10-CM | POA: Diagnosis not present

## 2020-12-04 DIAGNOSIS — J449 Chronic obstructive pulmonary disease, unspecified: Secondary | ICD-10-CM | POA: Diagnosis not present

## 2020-12-04 DIAGNOSIS — J432 Centrilobular emphysema: Secondary | ICD-10-CM | POA: Diagnosis not present

## 2020-12-04 NOTE — Patient Instructions (Addendum)
Covid 19:  Continue prednisone  May start paxlovid today - day 5 of symptoms  Stay well hydrated  Stay active  Deep breathing exercises  May take tylenol for fever or pain  May take mucinex twice daily if needed  Poor appetite: eat 6 small meals / snacks throughout the day   Depression and Anxiety:  Will schedule appointment with counselor    Follow up:  Follow up with PCP in 1 week or sooner if needed

## 2020-12-04 NOTE — Progress Notes (Signed)
Virtual Visit via Telephone Note  I connected with Richard Fuentes on 12/04/20 at  9:40 AM EST by telephone and verified that I am speaking with the correct person using two identifiers.  Location: Patient: home Provider: office   I discussed the limitations, risks, security and privacy concerns of performing an evaluation and management service by telephone and the availability of in person appointments. I also discussed with the patient that there may be a patient responsible charge related to this service. The patient expressed understanding and agreed to proceed.   History of Present Illness:  Patient presents today through telephone visit.  Patient was seen in the ED on 11/30/2020 and was diagnosed with COVID.  He states that his symptoms started that same day.  Patient does have a past medical history of lung disease and recently had underwent lung [facility] at [facility].  He has been on Trelegy and albuterol for maintenance and does have supplemental oxygen as needed.  He was prescribed PaxlOVID and prednisone in the ED. he states that he has been taking the prednisone.  He did not start his Paxil COVID.  He states that he is currently having severe sore throat and muscle aches.  His oxygen level has been staying between 92 -  96%.  Patient has taken over-the-counter Tylenol to help with body aches.  We discussed that he is still within the 5-day window to start the oral antiviral treatment.  Patient states that he will consider taking this today.  He refuses any other medications at this time.  He states that he has a hard time taking new prescriptions due to his underlying OCD.  Patient states that he has also been having personal issues leading to depressed mood and anxiety over the past couple weeks.  We discussed that we can get him set up for counseling. Denies f/c/s, n/v/d, hemoptysis, PND, chest pain or edema.    Observations/Objective:  Vitals with BMI 12/01/2020 12/01/2020 11/30/2020   Height - - -  Weight - - -  BMI - - -  Systolic 112 114 563  Diastolic 73 73 84  Pulse 99 107 98      Assessment and Plan:  Covid 19:  Continue prednisone  May start paxlovid today - day 5 of symptoms  Stay well hydrated  Stay active  Deep breathing exercises  May take tylenol for fever or pain  May take mucinex twice daily if needed  Poor appetite: eat 6 small meals / snacks throughout the day   Depression and Anxiety:  Will schedule appointment with counselor   Follow up:  Follow up with PCP in 1 week or sooner if needed    I discussed the assessment and treatment plan with the patient. The patient was provided an opportunity to ask questions and all were answered. The patient agreed with the plan and demonstrated an understanding of the instructions.   The patient was advised to call back or seek an in-person evaluation if the symptoms worsen or if the condition fails to improve as anticipated.  I provided 23 minutes of non-face-to-face time during this encounter.   Ivonne Andrew, NP

## 2020-12-07 NOTE — Progress Notes (Deleted)
Established Patient Office Visit  Subjective:  Patient ID: Richard Fuentes, male    DOB: Sep 25, 1970  Age: 50 y.o. MRN: [id number]  CC: No chief complaint on file.   HPI Richard Fuentes presents for ***  Past Medical History:  Diagnosis Date   Anxiety    Asthma    COPD (chronic obstructive pulmonary disease) (HCC)    Severe centrilobular and paraseptal bullous emphysema; Gold stage D; FEV1 and DLCO<30%.;  Currently undergoing transplant evaluation   Dyspnea    Environmental allergies    Esophagitis    Family history of adverse reaction to anesthesia    Paternal aunt had trouble waking   GERD (gastroesophageal reflux disease)    Headache    Hx of migraines    OCD (obsessive compulsive disorder)    OCD (obsessive compulsive disorder)    Pneumonia    Pneumothorax on left 03/20/2020   Psoriasis    Sleep apnea    Urticaria     Past Surgical History:  Procedure Laterality Date   Endobronchial valve Left 03/20/2020   L[facility] at [facility]   KNEE SURGERY Right 2002   arthroscopy and cartilidge removed   KNEE SURGERY Left 1987   Ligaments removed and screw in place    Family History  Problem Relation Age of Onset   Multiple sclerosis Sister    Psoriasis Maternal Grandmother    Heart attack Mother        Late in life   Heart attack Father 23   CAD Father    Heart attack Paternal Grandfather 29       Sudden cardiac death   Sudden Cardiac Death Paternal Grandfather 1   Heart attack Maternal Uncle    CAD Maternal Uncle    Heart attack Cousin 36   Sudden Cardiac Death Cousin 59   Allergic rhinitis Neg Hx    Angioedema Neg Hx    Asthma Neg Hx    Eczema Neg Hx    Immunodeficiency Neg Hx    Urticaria Neg Hx     Social History   Socioeconomic History   Marital status: Significant Other    Spouse name: Not on file   Number of children: Not on file   Years of education: Not on file   Highest education level: Not on file  Occupational History   Not on file  Tobacco Use    Smoking status: Former    Packs/day: 1.00    Years: 30.00    Pack years: 30.00    Types: Cigarettes    Quit date: 01/19/2018    Years since quitting: 2.8   Smokeless tobacco: Never  Vaping Use   Vaping Use: Never used  Substance and Sexual Activity   Alcohol use: No   Drug use: No   Sexual activity: Not Currently  Other Topics Concern   Not on file  Social History Narrative   Not on file   Social Determinants of Health   Financial Resource Strain: Not on file  Food Insecurity: Not on file  Transportation Needs: Not on file  Physical Activity: Not on file  Stress: Not on file  Social Connections: Not on file  Intimate Partner Violence: Not on file    Outpatient Medications Prior to Visit  Medication Sig Dispense Refill   albuterol (VENTOLIN HFA) 108 (90 Base) MCG/ACT inhaler Inhale 2 puffs into the lungs every 6 (six) hours as needed for wheezing or shortness of breath. 8.5 g 1   benzonatate (TESSALON) 100 MG  capsule Take 1 capsule (100 mg total) by mouth every 8 (eight) hours. 90 capsule 2   calcipotriene (DOVONOX) 0.005 % cream Apply topically 2 (two) times daily. 60 g 0   desonide (DESOWEN) 0.05 % cream Apply 1 application topically 2 (two) times daily as needed (psoriasis).     EPINEPHrine 0.3 mg/0.3 mL IJ SOAJ injection Inject 0.3 mg into the muscle as needed for anaphylaxis.     esomeprazole (NEXIUM) 40 MG capsule Take 1 capsule (40 mg total) by mouth 2 (two) times daily before a meal. 60 capsule 3   Fluticasone-Umeclidin-Vilant (TRELEGY ELLIPTA) 200-62.5-25 MCG/ACT AEPB Inhale 1 puff into the lungs daily. 60 each 4   halobetasol (ULTRAVATE) 0.05 % cream Apply topically 2 (two) times daily. 50 g 0   hydrocortisone 2.5 % cream Apply 1 application topically 2 (two) times daily as needed (psoriasis).     ibuprofen (ADVIL) 200 MG tablet Take 600-800 mg by mouth every 6 (six) hours as needed for mild pain.     ipratropium-albuterol (DUONEB) 0.5-2.5 (3) MG/3ML SOLN Inhale 3  mLs into the lungs every 6 (six) hours as needed (shortness of breath).     Multiple Vitamin (MULTIVITAMIN WITH MINERALS) TABS tablet Take 1 tablet by mouth daily.     predniSONE (DELTASONE) 20 MG tablet Take 2 tablets (40 mg total) by mouth daily. 10 tablet 0   traZODone (DESYREL) 50 MG tablet Take 0.5-1 tablets (25-50 mg total) by mouth at bedtime as needed for sleep. (Patient not taking: Reported on 11/05/2020) 30 tablet 3   No facility-administered medications prior to visit.    Allergies  Allergen Reactions   Other Anaphylaxis    Wool   Shellfish Allergy     Unknown reaction    ROS Review of Systems    Objective:    Physical Exam  There were no vitals taken for this visit. Wt Readings from Last 3 Encounters:  08/06/20 194 lb 4.8 oz (88.1 kg)  07/15/20 198 lb 3.1 oz (89.9 kg)  07/11/20 197 lb 1.5 oz (89.4 kg)     Health Maintenance Due  Topic Date Due   Pneumococcal Vaccine 48-29 Years old (1 - PCV) Never done   COVID-19 Vaccine (3 - Booster for Pfizer series) 03/25/2020    There are no preventive care reminders to display for this patient.  Lab Results  Component Value Date   TSH 0.821 04/28/2010   Lab Results  Component Value Date   WBC 10.7 (H) 11/30/2020   HGB 14.5 11/30/2020   HCT 42.1 11/30/2020   MCV 89.4 11/30/2020   PLT 284 11/30/2020   Lab Results  Component Value Date   NA 137 11/30/2020   K 3.3 (L) 11/30/2020   CO2 25 11/30/2020   GLUCOSE 112 (H) 11/30/2020   BUN 15 11/30/2020   CREATININE 1.04 11/30/2020   BILITOT 0.3 11/30/2020   ALKPHOS 65 11/30/2020   AST 18 11/30/2020   ALT 17 11/30/2020   PROT 6.7 11/30/2020   ALBUMIN 4.0 11/30/2020   CALCIUM 9.6 11/30/2020   ANIONGAP 10 11/30/2020   Lab Results  Component Value Date   CHOL 240 (H) 12/07/2018   Lab Results  Component Value Date   HDL 50 12/07/2018   Lab Results  Component Value Date   LDLCALC 165 (H) 12/07/2018   Lab Results  Component Value Date   TRIG 139  12/07/2018   Lab Results  Component Value Date   CHOLHDL 4.8 12/07/2018   Lab Results  Component Value Date   HGBA1C 5.8 (H) 12/07/2018      Assessment & Plan:   Problem List Items Addressed This Visit   None   No orders of the defined types were placed in this encounter.   Follow-up: No follow-ups on file.    Asencion Noble, MD

## 2020-12-08 ENCOUNTER — Ambulatory Visit: Payer: Medicaid Other | Admitting: Critical Care Medicine

## 2020-12-10 ENCOUNTER — Encounter: Payer: Self-pay | Admitting: Critical Care Medicine

## 2020-12-10 NOTE — Telephone Encounter (Signed)
[name]  this pt needs face to face with me some time by end of month

## 2020-12-16 ENCOUNTER — Institutional Professional Consult (permissible substitution): Payer: Medicaid Other | Admitting: Clinical

## 2020-12-16 DIAGNOSIS — Z87891 Personal history of nicotine dependence: Secondary | ICD-10-CM | POA: Diagnosis not present

## 2020-12-16 DIAGNOSIS — R0602 Shortness of breath: Secondary | ICD-10-CM | POA: Diagnosis not present

## 2020-12-16 DIAGNOSIS — J432 Centrilobular emphysema: Secondary | ICD-10-CM | POA: Diagnosis not present

## 2020-12-16 DIAGNOSIS — Z79899 Other long term (current) drug therapy: Secondary | ICD-10-CM | POA: Diagnosis not present

## 2020-12-16 DIAGNOSIS — K219 Gastro-esophageal reflux disease without esophagitis: Secondary | ICD-10-CM | POA: Diagnosis not present

## 2020-12-16 DIAGNOSIS — R262 Difficulty in walking, not elsewhere classified: Secondary | ICD-10-CM | POA: Diagnosis not present

## 2020-12-16 DIAGNOSIS — F429 Obsessive-compulsive disorder, unspecified: Secondary | ICD-10-CM | POA: Diagnosis not present

## 2020-12-16 DIAGNOSIS — R942 Abnormal results of pulmonary function studies: Secondary | ICD-10-CM | POA: Diagnosis not present

## 2020-12-16 DIAGNOSIS — J9811 Atelectasis: Secondary | ICD-10-CM | POA: Diagnosis not present

## 2020-12-16 DIAGNOSIS — E785 Hyperlipidemia, unspecified: Secondary | ICD-10-CM | POA: Diagnosis not present

## 2020-12-22 ENCOUNTER — Other Ambulatory Visit: Payer: Self-pay

## 2021-01-04 DIAGNOSIS — J449 Chronic obstructive pulmonary disease, unspecified: Secondary | ICD-10-CM | POA: Diagnosis not present

## 2021-01-05 ENCOUNTER — Other Ambulatory Visit: Payer: Self-pay | Admitting: Critical Care Medicine

## 2021-01-05 MED ORDER — ALBUTEROL SULFATE HFA 108 (90 BASE) MCG/ACT IN AERS
2.0000 | INHALATION_SPRAY | Freq: Four times a day (QID) | RESPIRATORY_TRACT | 2 refills | Status: DC | PRN
  Filled 2021-01-05: qty 18, 53d supply, fill #0
  Filled 2021-03-09: qty 18, 25d supply, fill #1

## 2021-01-06 ENCOUNTER — Other Ambulatory Visit (HOSPITAL_BASED_OUTPATIENT_CLINIC_OR_DEPARTMENT_OTHER): Payer: Self-pay

## 2021-01-12 ENCOUNTER — Institutional Professional Consult (permissible substitution): Payer: Medicaid Other | Admitting: Clinical

## 2021-01-13 DIAGNOSIS — Z01811 Encounter for preprocedural respiratory examination: Secondary | ICD-10-CM | POA: Diagnosis not present

## 2021-01-13 DIAGNOSIS — Z87891 Personal history of nicotine dependence: Secondary | ICD-10-CM | POA: Diagnosis not present

## 2021-01-13 DIAGNOSIS — Z01818 Encounter for other preprocedural examination: Secondary | ICD-10-CM | POA: Diagnosis not present

## 2021-01-13 DIAGNOSIS — J432 Centrilobular emphysema: Secondary | ICD-10-CM | POA: Diagnosis not present

## 2021-01-13 DIAGNOSIS — F419 Anxiety disorder, unspecified: Secondary | ICD-10-CM | POA: Diagnosis not present

## 2021-01-13 DIAGNOSIS — L409 Psoriasis, unspecified: Secondary | ICD-10-CM | POA: Diagnosis not present

## 2021-01-13 DIAGNOSIS — F422 Mixed obsessional thoughts and acts: Secondary | ICD-10-CM | POA: Diagnosis not present

## 2021-01-13 DIAGNOSIS — Z9989 Dependence on other enabling machines and devices: Secondary | ICD-10-CM | POA: Diagnosis not present

## 2021-01-13 DIAGNOSIS — G4733 Obstructive sleep apnea (adult) (pediatric): Secondary | ICD-10-CM | POA: Diagnosis not present

## 2021-01-15 DIAGNOSIS — Z9989 Dependence on other enabling machines and devices: Secondary | ICD-10-CM | POA: Diagnosis not present

## 2021-01-15 DIAGNOSIS — R918 Other nonspecific abnormal finding of lung field: Secondary | ICD-10-CM | POA: Diagnosis not present

## 2021-01-15 DIAGNOSIS — L409 Psoriasis, unspecified: Secondary | ICD-10-CM | POA: Diagnosis not present

## 2021-01-15 DIAGNOSIS — J432 Centrilobular emphysema: Secondary | ICD-10-CM | POA: Diagnosis not present

## 2021-01-15 DIAGNOSIS — T17808A Unspecified foreign body in other parts of respiratory tract causing other injury, initial encounter: Secondary | ICD-10-CM | POA: Diagnosis not present

## 2021-01-15 DIAGNOSIS — J439 Emphysema, unspecified: Secondary | ICD-10-CM | POA: Diagnosis not present

## 2021-01-15 DIAGNOSIS — G4733 Obstructive sleep apnea (adult) (pediatric): Secondary | ICD-10-CM | POA: Diagnosis not present

## 2021-01-16 DIAGNOSIS — R918 Other nonspecific abnormal finding of lung field: Secondary | ICD-10-CM | POA: Diagnosis not present

## 2021-01-16 DIAGNOSIS — J432 Centrilobular emphysema: Secondary | ICD-10-CM | POA: Diagnosis not present

## 2021-01-17 DIAGNOSIS — R918 Other nonspecific abnormal finding of lung field: Secondary | ICD-10-CM | POA: Diagnosis not present

## 2021-01-17 DIAGNOSIS — J432 Centrilobular emphysema: Secondary | ICD-10-CM | POA: Diagnosis not present

## 2021-01-17 DIAGNOSIS — J439 Emphysema, unspecified: Secondary | ICD-10-CM | POA: Diagnosis not present

## 2021-01-18 DIAGNOSIS — J432 Centrilobular emphysema: Secondary | ICD-10-CM | POA: Diagnosis not present

## 2021-01-18 DIAGNOSIS — J439 Emphysema, unspecified: Secondary | ICD-10-CM | POA: Diagnosis not present

## 2021-01-18 DIAGNOSIS — R918 Other nonspecific abnormal finding of lung field: Secondary | ICD-10-CM | POA: Diagnosis not present

## 2021-01-19 DIAGNOSIS — J432 Centrilobular emphysema: Secondary | ICD-10-CM | POA: Diagnosis not present

## 2021-01-19 DIAGNOSIS — J439 Emphysema, unspecified: Secondary | ICD-10-CM | POA: Diagnosis not present

## 2021-01-19 DIAGNOSIS — J9811 Atelectasis: Secondary | ICD-10-CM | POA: Diagnosis not present

## 2021-01-19 DIAGNOSIS — R918 Other nonspecific abnormal finding of lung field: Secondary | ICD-10-CM | POA: Diagnosis not present

## 2021-01-20 ENCOUNTER — Encounter: Payer: Self-pay | Admitting: Critical Care Medicine

## 2021-01-21 ENCOUNTER — Telehealth: Payer: Self-pay | Admitting: Critical Care Medicine

## 2021-01-21 DIAGNOSIS — J431 Panlobular emphysema: Secondary | ICD-10-CM

## 2021-01-21 DIAGNOSIS — Z09 Encounter for follow-up examination after completed treatment for conditions other than malignant neoplasm: Secondary | ICD-10-CM

## 2021-01-21 NOTE — Telephone Encounter (Signed)
fyi

## 2021-01-21 NOTE — Telephone Encounter (Signed)
Copied from CRM [id number]. Topic: General - Other >> Jan 21, 2021  2:19 PM Gaetana Michaelis A wrote: Reason for CRM: The patient has been directed by their surgeon at [facility] to call their PCP and request orders for xrays of their chest to follow up post surgery to check on the patient's valves   Please contact further when possible

## 2021-01-22 ENCOUNTER — Ambulatory Visit
Admission: RE | Admit: 2021-01-22 | Discharge: 2021-01-22 | Disposition: A | Discharge status: Home / Self Care | Payer: Medicaid Other | Source: Ambulatory Visit | Attending: Critical Care Medicine | Admitting: Critical Care Medicine

## 2021-01-22 ENCOUNTER — Telehealth (INDEPENDENT_AMBULATORY_CARE_PROVIDER_SITE_OTHER): Payer: Self-pay | Admitting: Clinical

## 2021-01-22 DIAGNOSIS — R0602 Shortness of breath: Secondary | ICD-10-CM | POA: Diagnosis not present

## 2021-01-22 DIAGNOSIS — J431 Panlobular emphysema: Secondary | ICD-10-CM

## 2021-01-22 DIAGNOSIS — Z09 Encounter for follow-up examination after completed treatment for conditions other than malignant neoplasm: Secondary | ICD-10-CM

## 2021-01-22 NOTE — Telephone Encounter (Signed)
Called pt  and someone already gave him a call to let him know

## 2021-01-22 NOTE — Telephone Encounter (Signed)
I spoke with this pt today and scheduled an appt with him for 02/04/21. He is just getting out of the hospital after having surgery on his lungs. He is asking for an in home aide. I informed him about the CAP program. He mentioned that he has someone staying with him for a few days but they will be leaving soon.

## 2021-01-22 NOTE — Telephone Encounter (Signed)
Order sent for xray   pt to go to [facility]

## 2021-01-23 ENCOUNTER — Telehealth: Payer: Self-pay | Admitting: Critical Care Medicine

## 2021-01-23 NOTE — Telephone Encounter (Signed)
Copied from CRM [id number]. Topic: Referral - Request for Referral >> Jan 21, 2021 11:55 AM Elliot Gault wrote: Jethro Bolus name: Rosey Bath  Relation to pt: Nurse [facility]  Call back number: [phone]   Reason for call:  Patient requesting a referral to [facility] for personal care assistant worker

## 2021-01-25 NOTE — Telephone Encounter (Signed)
Jane pls set up order for pcs to [facility]

## 2021-01-26 NOTE — Telephone Encounter (Signed)
This CM spoke to [facility] and explained to her that [facility] referral can be submitted for patient after his appointment with Dr Delford Field tomorrow- 01/27/2021.  He has not seen Dr Delford Field in over 90 days.

## 2021-01-26 NOTE — Telephone Encounter (Signed)
Called pt and set up appointment.

## 2021-01-27 ENCOUNTER — Other Ambulatory Visit (HOSPITAL_BASED_OUTPATIENT_CLINIC_OR_DEPARTMENT_OTHER): Payer: Self-pay

## 2021-01-27 ENCOUNTER — Encounter: Payer: Self-pay | Admitting: Critical Care Medicine

## 2021-01-27 ENCOUNTER — Ambulatory Visit: Payer: Medicaid Other | Attending: Critical Care Medicine | Admitting: Critical Care Medicine

## 2021-01-27 VITALS — Wt 181.0 lb

## 2021-01-27 DIAGNOSIS — K4031 Unilateral inguinal hernia, with obstruction, without gangrene, recurrent: Secondary | ICD-10-CM

## 2021-01-27 DIAGNOSIS — G4733 Obstructive sleep apnea (adult) (pediatric): Secondary | ICD-10-CM | POA: Diagnosis not present

## 2021-01-27 DIAGNOSIS — J431 Panlobular emphysema: Secondary | ICD-10-CM | POA: Diagnosis not present

## 2021-01-27 DIAGNOSIS — L400 Psoriasis vulgaris: Secondary | ICD-10-CM

## 2021-01-27 DIAGNOSIS — E785 Hyperlipidemia, unspecified: Secondary | ICD-10-CM | POA: Diagnosis not present

## 2021-01-27 DIAGNOSIS — K056 Periodontal disease, unspecified: Secondary | ICD-10-CM | POA: Diagnosis not present

## 2021-01-27 MED ORDER — BENZONATATE 100 MG PO CAPS: 100.0000 mg | ORAL_CAPSULE | Freq: Three times a day (TID) | ORAL | 2 refills | Status: AC

## 2021-01-27 NOTE — Patient Instructions (Signed)
Refill on benzonatate sent to [facility] pharmacy  No other medication changes  Wear your oxygen 1-2 liters all the time  We will call you with an appointment in March

## 2021-01-27 NOTE — Progress Notes (Signed)
Established Patient Office Visit  Subjective:  Patient ID: Richard Fuentes, male    DOB: [birth year]  Age: 51 y.o. MRN: [id number] Virtual Visit via Video Note  I connected with Richard Fuentes on 01/28/21 at 330pm by a video enabled telemedicine application and verified that I am speaking with the correct person using two identifiers.   Consent:  I discussed the limitations, risks, security and privacy concerns of performing an evaluation and management service by video visit and the availability of in person appointments. I also discussed with the patient that there may be a patient responsible charge related to this service. The patient expressed understanding and agreed to proceed.  Location of patient: The patient is at home Location of provider: I am in my office  Persons participating in the televisit with the patient.       History of Present Illness:   CC: Primary care follow-up assessment for PCS  HPI Richard Fuentes presents for primary care follow-up history of severe COPD with significant centrilobular emphysema and chronic respiratory failure..  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  Since this procedure was done in the past month he has slowly improved although he had quite a bit of coughing and dyspnea immediately following the procedure.  Follow-up chest x-ray did not show any pneumothorax.  The patient eventually will need to have inguinal hernia repair but needs to have some dental work first.  He needs to recover from the bronchial valve surgery for any of these procedures can occur.  His saturations when he walks 15 feet are more drop in the 80% range.  He is not been using his oxygen during the day with ambulation only at night.  At rest his oxygenation averages 98 to 94%.  He is using his Trelegy daily.  He does have his nebulizer.  He was given cough syrup and he has  been coughing quite a bit but is been not wanting to use it.  He is encouraged to use the cough medication given by the [facility] lung physicians.  He is not having significant chest pain at this time.  He states in the last few days he is improved.  X-ray does show gradual deflation of the left upper lobe as intended.  Past Medical History:  Diagnosis Date   Anxiety    Asthma    COPD (chronic obstructive pulmonary disease) (HCC)    Severe centrilobular and paraseptal bullous emphysema; Gold stage D; FEV1 and DLCO<30%.;  Currently undergoing transplant evaluation   Dyspnea    Environmental allergies    Esophagitis    Family history of adverse reaction to anesthesia    Paternal aunt had trouble waking   GERD (gastroesophageal reflux disease)    Headache    Hx of migraines    OCD (obsessive compulsive disorder)    OCD (obsessive compulsive disorder)    Pneumonia    Pneumothorax on left 03/20/2020   Psoriasis    Sleep apnea    Urticaria     Past Surgical History:  Procedure Laterality Date   Endobronchial valve Left 03/20/2020   LUL at [place] Right 2002   arthroscopy and cartilidge removed   KNEE SURGERY Left 1987   Ligaments removed and screw in place    Family History  Problem Relation Age of Onset   Multiple sclerosis Sister    Psoriasis Maternal  Grandmother    Heart attack Mother        Late in life   Heart attack Father 6   CAD Father    Heart attack Paternal Grandfather 58       Sudden cardiac death   Sudden Cardiac Death Paternal Grandfather 23   Heart attack Maternal Uncle    CAD Maternal Uncle    Heart attack Cousin 58   Sudden Cardiac Death Cousin 69   Allergic rhinitis Neg Hx    Angioedema Neg Hx    Asthma Neg Hx    Eczema Neg Hx    Immunodeficiency Neg Hx    Urticaria Neg Hx     Social History   Socioeconomic History   Marital status: Significant Other    Spouse name: Not on file   Number of children: Not on file   Years of education:  Not on file   Highest education level: Not on file  Occupational History   Not on file  Tobacco Use   Smoking status: Former    Packs/day: 1.00    Years: 30.00    Pack years: 30.00    Types: Cigarettes    Quit date: 01/19/2018    Years since quitting: 3.0   Smokeless tobacco: Never  Vaping Use   Vaping Use: Never used  Substance and Sexual Activity   Alcohol use: No   Drug use: No   Sexual activity: Not Currently  Other Topics Concern   Not on file  Social History Narrative   Not on file   Social Determinants of Health   Financial Resource Strain: Not on file  Food Insecurity: Not on file  Transportation Needs: Not on file  Physical Activity: Not on file  Stress: Not on file  Social Connections: Not on file  Intimate Partner Violence: Not on file    Outpatient Medications Prior to Visit  Medication Sig Dispense Refill   albuterol (VENTOLIN HFA) 108 (90 Base) MCG/ACT inhaler Inhale 2 puffs into the lungs every 6 (six) hours as needed for wheezing or shortness of breath. 18 g 2   calcipotriene (DOVONOX) 0.005 % cream Apply topically 2 (two) times daily. 60 g 0   desonide (DESOWEN) 0.05 % cream Apply 1 application topically 2 (two) times daily as needed (psoriasis).     EPINEPHrine 0.3 mg/0.3 mL IJ SOAJ injection Inject 0.3 mg into the muscle as needed for anaphylaxis.     esomeprazole (NEXIUM) 40 MG capsule Take 1 capsule (40 mg total) by mouth 2 (two) times daily before a meal. 60 capsule 3   Fluticasone-Umeclidin-Vilant (TRELEGY ELLIPTA) 200-62.5-25 MCG/ACT AEPB Inhale 1 puff into the lungs daily. 60 each 4   halobetasol (ULTRAVATE) 0.05 % cream Apply topically 2 (two) times daily. 50 g 0   hydrocortisone 2.5 % cream Apply 1 application topically 2 (two) times daily as needed (psoriasis).     ibuprofen (ADVIL) 200 MG tablet Take 600-800 mg by mouth every 6 (six) hours as needed for mild pain.     ipratropium-albuterol (DUONEB) 0.5-2.5 (3) MG/3ML SOLN Inhale 3 mLs into the  lungs every 6 (six) hours as needed (shortness of breath).     Multiple Vitamin (MULTIVITAMIN WITH MINERALS) TABS tablet Take 1 tablet by mouth daily.     traZODone (DESYREL) 50 MG tablet Take 0.5-1 tablets (25-50 mg total) by mouth at bedtime as needed for sleep. 30 tablet 3   benzonatate (TESSALON) 100 MG capsule Take 1 capsule (100 mg total) by mouth  every 8 (eight) hours. 90 capsule 2   predniSONE (DELTASONE) 20 MG tablet Take 2 tablets (40 mg total) by mouth daily. 10 tablet 0   No facility-administered medications prior to visit.    Allergies  Allergen Reactions   Other Anaphylaxis    Wool   Shellfish Allergy     Unknown reaction    ROS Review of Systems    Objective:    Physical Exam No exam this is a video visit but the patient is seen on video he is coughing considerable amount and is very dyspneic even at rest but is in no acute distress and he states he is overall better than he was a few days ago Wt 181 lb (82.1 kg) Comment: home   BMI 23.88 kg/m  Wt Readings from Last 3 Encounters:  01/27/21 181 lb (82.1 kg)  08/06/20 194 lb 4.8 oz (88.1 kg)  07/15/20 198 lb 3.1 oz (89.9 kg)     Health Maintenance Due  Topic Date Due   COVID-19 Vaccine (3 - Booster for Pfizer series) 03/25/2020    There are no preventive care reminders to display for this patient.  Lab Results  Component Value Date   TSH 0.821 04/28/2010   Lab Results  Component Value Date   WBC 10.7 (H) 11/30/2020   HGB 14.5 11/30/2020   HCT 42.1 11/30/2020   MCV 89.4 11/30/2020   PLT 284 11/30/2020   Lab Results  Component Value Date   NA 137 11/30/2020   K 3.3 (L) 11/30/2020   CO2 25 11/30/2020   GLUCOSE 112 (H) 11/30/2020   BUN 15 11/30/2020   CREATININE 1.04 11/30/2020   BILITOT 0.3 11/30/2020   ALKPHOS 65 11/30/2020   AST 18 11/30/2020   ALT 17 11/30/2020   PROT 6.7 11/30/2020   ALBUMIN 4.0 11/30/2020   CALCIUM 9.6 11/30/2020   ANIONGAP 10 11/30/2020   Lab Results  Component  Value Date   CHOL 240 (H) 12/07/2018   Lab Results  Component Value Date   HDL 50 12/07/2018   Lab Results  Component Value Date   LDLCALC 165 (H) 12/07/2018   Lab Results  Component Value Date   TRIG 139 12/07/2018   Lab Results  Component Value Date   CHOLHDL 4.8 12/07/2018   Lab Results  Component Value Date   HGBA1C 5.8 (H) 12/07/2018      Assessment & Plan:   Problem List Items Addressed This Visit       Respiratory   COPD with emphysema (HCC)GOLD D  (Chronic)    Stage D COPD patient has had bronchial valve placement at [facility] he is improving with left upper lobe collapse in his left lower lobe is having better chance of reexpanding and is in the process of this and this is improving his level of dyspnea and functionality.  Note this patient has previously been turned down for any transplant opportunities.  Patient does have full Medicaid at this time  Patient does meet all qualifications for personal care services.  He can only walk a few feet without becoming extremely dyspneic.  He is not able to care for himself in his home environment and would benefit from personal care services.      Relevant Medications   benzonatate (TESSALON) 100 MG capsule   OSA (obstructive sleep apnea)    Significant sleep apnea however the patient's not been able to tolerate CPAP because of air trapping he is using oxygen to sleep 2 L  Digestive   Chronic periodontal disease    Patient has significant dental work that needs to occur before he can undergo inguinal hernia repair        Musculoskeletal and Integument   Plaque psoriasis    Patient continues with topical treatments for psoriasis        Other   Hyperlipidemia with target LDL less than 100 (Chronic)    Patient continues with atorvastatin      Inguinal hernia with obstruction without gangrene    Patient has connected with general surgery they are requesting dental work occur before the inguinal hernia  is repaired because he will need mesh placement       Meds ordered this encounter  Medications   benzonatate (TESSALON) 100 MG capsule    Sig: Take 1 capsule (100 mg total) by mouth every 8 (eight) hours.    Dispense:  90 capsule    Refill:  2   38 minutes spent with history taking patient education collaboration with nurse case manager Follow Up Instructions: Patient will have a direct exam scheduled in March and refill sent to his pharmacy's We will fill out his paperwork for PCS in collaboration with clinical nurse case manager    I discussed the assessment and treatment plan with the patient. The patient was provided an opportunity to ask questions and all were answered. The patient agreed with the plan and demonstrated an understanding of the instructions.   The patient was advised to call back or seek an in-person evaluation if the symptoms worsen or if the condition fails to improve as anticipated.  I provided 38 minutes of non-face-to-face time during this encounter  including  median intraservice time , review of notes, labs, imaging, medications  and explaining diagnosis and management to the patient .    Asencion Noble, MD

## 2021-01-28 NOTE — Assessment & Plan Note (Signed)
Patient has significant dental work that needs to occur before he can undergo inguinal hernia repair

## 2021-01-28 NOTE — Assessment & Plan Note (Signed)
Patient continues with topical treatments for psoriasis

## 2021-01-28 NOTE — Assessment & Plan Note (Signed)
Patient has connected with general surgery they are requesting dental work occur before the inguinal hernia is repaired because he will need mesh placement

## 2021-01-28 NOTE — Assessment & Plan Note (Signed)
Patient continues with atorvastatin

## 2021-01-28 NOTE — Assessment & Plan Note (Addendum)
Stage D COPD patient has had bronchial valve placement at [facility] he is improving with left upper lobe collapse in his left lower lobe is having better chance of reexpanding and is in the process of this and this is improving his level of dyspnea and functionality.  Note this patient has previously been turned down for any transplant opportunities.  Patient does have full Medicaid at this time  Patient does meet all qualifications for personal care services.  He can only walk a few feet without becoming extremely dyspneic.  He is not able to care for himself in his home environment and would benefit from personal care services.  Will also use benzonatate and this was refilled

## 2021-01-28 NOTE — Assessment & Plan Note (Signed)
Significant sleep apnea however the patient's not been able to tolerate CPAP because of air trapping he is using oxygen to sleep 2 L

## 2021-01-29 ENCOUNTER — Other Ambulatory Visit (HOSPITAL_BASED_OUTPATIENT_CLINIC_OR_DEPARTMENT_OTHER): Payer: Self-pay

## 2021-01-30 ENCOUNTER — Telehealth: Payer: Self-pay

## 2021-01-30 NOTE — Telephone Encounter (Signed)
-----   Message from Storm Frisk, MD sent at 01/28/2021  6:20 AM EST ----- Pt needs face to face with me in mid march

## 2021-01-30 NOTE — Telephone Encounter (Signed)
Pt has been scheduled and sent a mychart message with appointment date and time. °

## 2021-02-02 ENCOUNTER — Telehealth: Payer: Self-pay

## 2021-02-02 NOTE — Telephone Encounter (Signed)
PCS referral and provider's visit note faxed to [facility]

## 2021-02-02 NOTE — Telephone Encounter (Signed)
PCS referral has been faxed to [facility]

## 2021-02-02 NOTE — Telephone Encounter (Signed)
Call received from patient inquiring how long it will take to receive PCS.  Explained to him that the referral has been faxed to [facility] and they will be contacting him for an assessment.  We do not have an estimate of how long that process will take.

## 2021-02-04 ENCOUNTER — Encounter: Payer: Medicaid Other | Admitting: Clinical

## 2021-02-04 DIAGNOSIS — J449 Chronic obstructive pulmonary disease, unspecified: Secondary | ICD-10-CM | POA: Diagnosis not present

## 2021-02-09 ENCOUNTER — Other Ambulatory Visit (HOSPITAL_BASED_OUTPATIENT_CLINIC_OR_DEPARTMENT_OTHER): Payer: Self-pay

## 2021-02-10 ENCOUNTER — Emergency Department (HOSPITAL_COMMUNITY): Payer: Medicaid Other

## 2021-02-10 ENCOUNTER — Emergency Department (HOSPITAL_COMMUNITY)
Admission: EM | Admit: 2021-02-10 | Discharge: 2021-02-10 | Disposition: A | Discharge status: Home / Self Care | Payer: Medicaid Other | Attending: Emergency Medicine | Admitting: Emergency Medicine

## 2021-02-10 ENCOUNTER — Other Ambulatory Visit (HOSPITAL_BASED_OUTPATIENT_CLINIC_OR_DEPARTMENT_OTHER): Payer: Self-pay

## 2021-02-10 ENCOUNTER — Other Ambulatory Visit: Payer: Self-pay

## 2021-02-10 DIAGNOSIS — Z7951 Long term (current) use of inhaled steroids: Secondary | ICD-10-CM | POA: Diagnosis not present

## 2021-02-10 DIAGNOSIS — R000 Tachycardia, unspecified: Secondary | ICD-10-CM | POA: Diagnosis not present

## 2021-02-10 DIAGNOSIS — R0602 Shortness of breath: Secondary | ICD-10-CM | POA: Diagnosis not present

## 2021-02-10 DIAGNOSIS — J439 Emphysema, unspecified: Secondary | ICD-10-CM | POA: Diagnosis not present

## 2021-02-10 DIAGNOSIS — J441 Chronic obstructive pulmonary disease with (acute) exacerbation: Secondary | ICD-10-CM

## 2021-02-10 MED ORDER — IPRATROPIUM-ALBUTEROL 0.5-2.5 (3) MG/3ML IN SOLN
3.0000 mL | Freq: Once | RESPIRATORY_TRACT | Status: AC
  Administered 2021-02-10: 3 mL via RESPIRATORY_TRACT
  Filled 2021-02-10: qty 3

## 2021-02-10 MED ORDER — IPRATROPIUM-ALBUTEROL 0.5-2.5 (3) MG/3ML IN SOLN
3.0000 mL | Freq: Four times a day (QID) | RESPIRATORY_TRACT | 0 refills | Status: DC | PRN
  Filled 2021-02-10: qty 360, 30d supply, fill #0

## 2021-02-10 MED ORDER — IPRATROPIUM-ALBUTEROL 0.5-2.5 (3) MG/3ML IN SOLN: 3.0000 mL | Freq: Four times a day (QID) | RESPIRATORY_TRACT | 0 refills | Status: DC | PRN

## 2021-02-10 NOTE — Discharge Instructions (Signed)
Use your nebulizers as needed.  Return if nebulizers are not controlling your breathing.

## 2021-02-10 NOTE — ED Triage Notes (Signed)
Pt came in with c/o SOB. Extensive pulmonary hx normally seen at [facility]. Pt came via EMS. He wears L O at all times. Pt states that he is SOB all the time but today it has been relentless. He normally does duonebs at home and ran out of medication. Pt had one duoneb via EMS and one duoneb and albuterol treatment at home. Sats 98-100 on normal 2L

## 2021-02-10 NOTE — ED Provider Notes (Signed)
[facility] EM[facility] Provider Note   CSN: [id number] Arrival date & time: 02/10/21  6659     History  Chief Complaint  Patient presents with   Shortness of Breath   Hernia    Richard Fuentes is a 51 y.o. male.  The history is provided by the patient.  Shortness of Breath He has history of hyperlipidemia, COPD and is status post bronchial valve surgery and comes in because of severe shortness of breath and nonstop coughing.  He has spells like this frequently and they usually are improved with nebulizer treatment with albuterol and ipratropium.  However, he had run out of his albuterol and ipratropium.  He used a nebulizer treatment with albuterol, but he thinks that it had expired.  It did not help.  He came in by ambulance and received a nebulizer treatment in route and states that he feels significantly better although not quite back to his baseline.  Of note, he has been advised not to take steroids until cleared by his surgeon.  He denies fever, chills, sweats.   Home Medications Prior to Admission medications   Medication Sig Start Date End Date Taking? Authorizing Provider  albuterol (VENTOLIN HFA) 108 (90 Base) MCG/ACT inhaler Inhale 2 puffs into the lungs every 6 (six) hours as needed for wheezing or shortness of breath. 01/05/21   Storm Frisk, MD  benzonatate (TESSALON) 100 MG capsule Take 1 capsule (100 mg total) by mouth every 8 (eight) hours. 01/27/21 01/27/22  Storm Frisk, MD  calcipotriene (DOVONOX) 0.005 % cream Apply topically 2 (two) times daily. 11/25/20   Storm Frisk, MD  desonide (DESOWEN) 0.05 % cream Apply 1 application topically 2 (two) times daily as needed (psoriasis).    [provider]  EPINEPHrine 0.3 mg/0.3 mL IJ SOAJ injection Inject 0.3 mg into the muscle as needed for anaphylaxis.    [provider]  esomeprazole (NEXIUM) 40 MG capsule Take 1 capsule (40 mg total) by mouth 2 (two) times daily  before a meal. 10/20/20   Storm Frisk, MD  Fluticasone-Umeclidin-Vilant (TRELEGY ELLIPTA) 200-62.5-25 MCG/ACT AEPB Inhale 1 puff into the lungs daily. 12/01/20   Storm Frisk, MD  halobetasol (ULTRAVATE) 0.05 % cream Apply topically 2 (two) times daily. 11/20/20   Storm Frisk, MD  hydrocortisone 2.5 % cream Apply 1 application topically 2 (two) times daily as needed (psoriasis). 05/20/20   [provider]  ibuprofen (ADVIL) 200 MG tablet Take 600-800 mg by mouth every 6 (six) hours as needed for mild pain.    [provider]  ipratropium-albuterol (DUONEB) 0.5-2.5 (3) MG/3ML SOLN Inhale 3 mLs into the lungs every 6 (six) hours as needed (shortness of breath).    [provider]  Multiple Vitamin (MULTIVITAMIN WITH MINERALS) TABS tablet Take 1 tablet by mouth daily.    [provider]  traZODone (DESYREL) 50 MG tablet Take 0.5-1 tablets (25-50 mg total) by mouth at bedtime as needed for sleep. 07/10/20   Storm Frisk, MD  clonazePAM (KLONOPIN) 0.5 MG tablet Take 1 tablet (0.5 mg total) by mouth 2 (two) times daily as needed for anxiety. 08/13/12 07/10/20  Moreno-Coll, Adlih, MD      Allergies    Other and Shellfish allergy    Review of Systems   Review of Systems  Respiratory:  Positive for shortness of breath.   All other systems reviewed and are negative.  Physical Exam Updated Vital Signs BP 112/71 (BP Location:  Right Arm)    Pulse 85    Temp 98.6 F (37 C) (Oral)    Resp (!) 32    SpO2 100%  Physical Exam Vitals and nursing note reviewed.  51 year old male, resting comfortably and in no acute distress. Vital signs are significant for rapid respiratory rate. Oxygen saturation is 100%, which is normal. Head is normocephalic and atraumatic. PERRLA, EOMI. Oropharynx is clear. Neck is nontender and supple without adenopathy or JVD. Back is nontender and there is no CVA tenderness. Lungs have a prolonged exhalation phase without overt  rales, wheezes, or rhonchi. Chest is nontender. Heart has regular rate and rhythm without murmur. Abdomen is soft, flat, nontender. Extremities have no cyanosis or edema, full range of motion is present. Skin is warm and dry without rash. Neurologic: Mental status is normal, cranial nerves are intact, moves all extremities equally.  ED Results / Procedures / Treatments    EKG EKG Interpretation  Date/Time:  Tuesday February 10 2021 03:16:28 EST Ventricular Rate:  84 PR Interval:  147 QRS Duration: 93 QT Interval:  370 QTC Calculation: 438 R Axis:   88 Text Interpretation: Sinus rhythm Normal ECG When compared with ECG of 11/30/2020, No significant change was found Confirmed by Dione Booze ([id number]) on 02/10/2021 3:22:33 AM  Radiology DG Chest Port 1 View  Result Date: 02/10/2021 CLINICAL DATA:  Shortness of breath EXAM: PORTABLE CHEST 1 VIEW COMPARISON:  01/22/2021 FINDINGS: Hyperinflation with asymmetric right apical lucency. Similar positioning of bronchial valves at the left hilum with mild volume loss. Normal heart size and stable mediastinal contours. There is no edema, consolidation, effusion, or pneumothorax. IMPRESSION: 1. Bronchial valves on the left with stable mild volume loss. No pneumothorax. 2. Emphysema. Electronically Signed   By: Tiburcio Pea M.D.   On: 02/10/2021 04:37    Procedures Procedures  Per my interpretation, cardiac monitor shows normal sinus rhythm.  Medications Ordered in ED Medications - No data to display  ED Course/ Medical Decision Making/ A&P                           Medical Decision Making Amount and/or Complexity of Data Reviewed Radiology: ordered.  Risk Prescription drug management.   COPD exacerbation in setting of pulmonary valve surgery.  Old records are reviewed showing recent placement of bronchial valves.  He has had prior ED visits with similar presentations.  Will check chest x-ray since he is at risk for pneumothorax  following pulmonary valve surgery, and give additional albuterol with ipratropium.  Steroids are withheld per his surgeon's request.  Chest x-ray shows no evidence of pneumonia, no pneumothorax.  I have independently viewed the image, and agree with radiologist interpretation.  Following nebulizer treatment with albuterol and ipratropium, he feels that he is back at his baseline.  He is discharged with a new prescription for albuterol plus ipratropium.  Follow-up with his lung team.        Final Clinical Impression(s) / ED Diagnoses Final diagnoses:  Chronic obstructive pulmonary disease with acute exacerbation (HCC)    Rx / DC Orders ED Discharge Orders          Ordered    ipratropium-albuterol (DUONEB) 0.5-2.5 (3) MG/3ML SOLN  Every 6 hours PRN,   Status:  Discontinued        02/10/21 0342    ipratropium-albuterol (DUONEB) 0.5-2.5 (3) MG/3ML SOLN  Every 6 hours PRN        02/10/21  0109              Dione Booze, MD 02/10/21 [phone]

## 2021-02-15 ENCOUNTER — Other Ambulatory Visit: Payer: Self-pay | Admitting: Critical Care Medicine

## 2021-02-16 NOTE — Telephone Encounter (Signed)
Requested Prescriptions  Pending Prescriptions Disp Refills   esomeprazole (NEXIUM) 40 MG capsule [Pharmacy Med Name: ESOMEPRAZOLE MAG DR 40 MG CAP] 60 capsule 3    Sig: TAKE ONE CAPSULE BY MOUTH TWICE A DAY BEFORE A MEAL     Gastroenterology: Proton Pump Inhibitors 2 Passed - 02/15/2021  6:50 AM      Passed - ALT in normal range and within 360 days    ALT  Date Value Ref Range Status  11/30/2020 17 0 - 44 U/L Final         Passed - AST in normal range and within 360 days    AST  Date Value Ref Range Status  11/30/2020 18 15 - 41 U/L Final         Passed - Valid encounter within last 12 months    Recent Outpatient Visits          2 weeks ago Panlobular emphysema (HCC)   [facility] Storm Frisk, MD   3 months ago Panlobular emphysema [facility])   [facility] Storm Frisk, MD   7 months ago Panlobular emphysema [name])   [facility] Storm Frisk, MD   10 months ago COPD exacerbation [facility])   [facility] Storm Frisk, MD   1 year ago Panlobular emphysema [facility])   [facility] Storm Frisk, MD      Future Appointments            In 1 month Delford Field Charlcie Cradle, MD [facility]

## 2021-02-20 ENCOUNTER — Encounter: Payer: Self-pay | Admitting: Critical Care Medicine

## 2021-02-25 ENCOUNTER — Ambulatory Visit: Payer: Medicaid Other | Attending: Critical Care Medicine | Admitting: Clinical

## 2021-02-25 DIAGNOSIS — F331 Major depressive disorder, recurrent, moderate: Secondary | ICD-10-CM | POA: Diagnosis not present

## 2021-02-27 DIAGNOSIS — M545 Low back pain, unspecified: Secondary | ICD-10-CM | POA: Diagnosis not present

## 2021-02-27 DIAGNOSIS — M25562 Pain in left knee: Secondary | ICD-10-CM | POA: Diagnosis not present

## 2021-02-27 DIAGNOSIS — M25561 Pain in right knee: Secondary | ICD-10-CM | POA: Diagnosis not present

## 2021-02-27 NOTE — BH Specialist Note (Signed)
Integrated Behavioral Health via Telemedicine Visit  02/25/2021 Damontae Seber [id number]  Number of [place] Clinician visits: 1- Initial Visit  Session Start time: 1010   Session End time: 1110  Total time in minutes: 60   Referring Provider: Asencion Noble, MD Patient/Family location: Home [facility] Provider location: [facility] office All persons participating in visit: Pt and LCSW Types of Service: Individual psychotherapy and Video visit  I connected with Patsy Baltimore via Video Enabled Telemedicine Application  (Video is Caregility application) and verified that I am speaking with the correct person using two identifiers. Discussed confidentiality: Yes   I discussed the limitations of telemedicine and the availability of in person appointments.  Discussed there is a possibility of technology failure and discussed alternative modes of communication if that failure occurs.  I discussed that engaging in this telemedicine visit, they consent to the provision of behavioral healthcare and the services will be billed under their insurance.  Patient and/or legal guardian expressed understanding and consented to Telemedicine visit: Yes   Presenting Concerns: Patient and/or family reports the following symptoms/concerns: Reports feeling anxious, excessive worrying, trouble relaxing, restlessness, irritability, feeling depressed, decreased energy, self-esteem disturbances, and trouble concentrating. Reports a hx of OCD. Reports that he has been experiencing COPD and emphysema. Reports that he's had multiple surgeries to assist with this. Reports that he is alone frequently. Reports that he does not like to take medication for mental health or physical health. Duration of problem: 2 years; Severity of problem: moderate  Patient and/or Family's Strengths/Protective Factors: Social and Emotional competence  Goals Addressed: Patient will:  Reduce symptoms of: anxiety and depression    Increase knowledge and/or ability of: coping skills   Demonstrate ability to: Increase healthy adjustment to current life circumstances  Progress towards Goals: Ongoing  Interventions: Interventions utilized:  Mindfulness or Psychologist, educational, Behavioral Activation, CBT Cognitive Behavioral Therapy, and Supportive Counseling Standardized Assessments completed: GAD-7 and PHQ 9 GAD 7 : Generalized Anxiety Score 02/25/2021 04/08/2020 10/24/2019 07/31/2019  Nervous, Anxious, on Edge 3 3 3 2   Control/stop worrying 2 3 3 2   Worry too much - different things 2 3 3 2   Trouble relaxing 2 3 3 2   Restless 2 3 3 1   Easily annoyed or irritable 2 3 3 1   Afraid - awful might happen 2 3 3 1   Total GAD 7 Score 15 21 21 11   Anxiety Difficulty - - - -     Depression screen [facility] 2/9 02/25/2021 07/11/2020 04/08/2020 10/24/2019 10/12/2019  Decreased Interest 1 0 2 2 -  Down, Depressed, Hopeless 3 1 3 3  -  PHQ - 2 Score 4 1 5 5  -  Altered sleeping 3 3 3 3  -  Tired, decreased energy 2 3 3 3  -  Change in appetite 1 0 3 2 -  Feeling bad or failure about yourself  3 1 3 3  -  Trouble concentrating 3 3 3 3  -  Moving slowly or fidgety/restless 3 0 2 2 -  Suicidal thoughts 0 0 0 0 -  PHQ-9 Score 19 - 22 21 -  Difficult doing work/chores - Somewhat difficult - - Very difficult  Some recent data might be hidden    Patient and/or Family Response: Pt receptive to tx. Pt receptive to psychoeducation providedon OCD, anxiety, and depression. Pt receptive to cognitive restructuring to decrease negative thoughts. Pt receptive to identifying enjoyable activities and utilize deep breathing exercises.  Assessment: Denies SI/HI. Denies auditory/visual hallucinations. Patient currently experiencing anxiety and  depression related to physical health. Pt appears to have difficulty with self-esteem as he talks down on his health choices. Pt believes difficulty with OCD contributes to pt not liking medication. Pt has been involved  in therapy for OCD in the past.   Patient may benefit from outpatient therapy and psychiatry. LCSW provided psychoeducation on OCD, depression, and anxiety. LCSW utilized cognitive restructuring to decrease negative thoughts. LCSW encouraged pt to identify enjoyable activities and deep breathing exercises. LCSW will refer pt for therapy and psychiatry. LCSW will fu with pt.  Plan: Follow up with behavioral health clinician on : 03/23/21 Behavioral recommendations: Identify enjoyable activities and utilize deep breathing exercises. Referral(s): [place] (In Clinic), Psychiatrist, and Counselor  I discussed the assessment and treatment plan with the patient and/or parent/guardian. They were provided an opportunity to ask questions and all were answered. They agreed with the plan and demonstrated an understanding of the instructions.   They were advised to call back or seek an in-person evaluation if the symptoms worsen or if the condition fails to improve as anticipated.  Ninette Cotta C Shellia Hartl, LCSW

## 2021-03-02 ENCOUNTER — Other Ambulatory Visit (HOSPITAL_BASED_OUTPATIENT_CLINIC_OR_DEPARTMENT_OTHER): Payer: Self-pay

## 2021-03-04 DIAGNOSIS — J449 Chronic obstructive pulmonary disease, unspecified: Secondary | ICD-10-CM | POA: Diagnosis not present

## 2021-03-09 ENCOUNTER — Other Ambulatory Visit: Payer: Self-pay | Admitting: Critical Care Medicine

## 2021-03-09 ENCOUNTER — Other Ambulatory Visit (HOSPITAL_BASED_OUTPATIENT_CLINIC_OR_DEPARTMENT_OTHER): Payer: Self-pay

## 2021-03-10 ENCOUNTER — Other Ambulatory Visit (HOSPITAL_BASED_OUTPATIENT_CLINIC_OR_DEPARTMENT_OTHER): Payer: Self-pay

## 2021-03-10 DIAGNOSIS — R0602 Shortness of breath: Secondary | ICD-10-CM | POA: Diagnosis not present

## 2021-03-10 DIAGNOSIS — K4090 Unilateral inguinal hernia, without obstruction or gangrene, not specified as recurrent: Secondary | ICD-10-CM | POA: Diagnosis not present

## 2021-03-10 DIAGNOSIS — J432 Centrilobular emphysema: Secondary | ICD-10-CM | POA: Diagnosis not present

## 2021-03-10 DIAGNOSIS — J9811 Atelectasis: Secondary | ICD-10-CM | POA: Diagnosis not present

## 2021-03-10 MED ORDER — CALCIPOTRIENE 0.005 % EX CREA
TOPICAL_CREAM | Freq: Two times a day (BID) | CUTANEOUS | 0 refills | Status: DC
  Filled 2021-03-10: qty 60, 30d supply, fill #0

## 2021-03-10 NOTE — Telephone Encounter (Signed)
Requested Prescriptions  ?Pending Prescriptions Disp Refills  ?? calcipotriene (DOVONOX) 0.005 % cream 60 g 0  ?  Sig: Apply topically 2 (two) times daily.  ?  ? Dermatology:  Other Passed - 03/09/2021 12:09 AM  ?  ?  Passed - Valid encounter within last 12 months  ?  Recent Outpatient Visits   ?      ? 1 month ago Panlobular emphysema (HCC)  ? [facility] Storm Frisk, MD  ? 4 months ago Panlobular emphysema [facility])  ? [facility] Storm Frisk, MD  ? 8 months ago Panlobular emphysema [facility])  ? [facility] Storm Frisk, MD  ? 11 months ago COPD exacerbation [facility])  ? [facility] Storm Frisk, MD  ? 1 year ago Panlobular emphysema [facility])  ? [facility] Storm Frisk, MD  ?  ?  ?Future Appointments   ?        ? In 1 week Storm Frisk, MD [facility]  ?  ? ?  ?  ?  ? ? ?

## 2021-03-11 ENCOUNTER — Other Ambulatory Visit (HOSPITAL_BASED_OUTPATIENT_CLINIC_OR_DEPARTMENT_OTHER): Payer: Self-pay

## 2021-03-22 NOTE — Progress Notes (Signed)
? ?Established Patient Office Visit ? ?Subjective:  ?Patient ID: Richard Fuentes, male    DOB: Apr 14, 1970  Age: 51 y.o. MRN: [id number] ?  ?CC: Primary care follow-up  ?HPI ?01/27/21 ?Richard Fuentes presents for primary care follow-up history of severe COPD with significant centrilobular emphysema and chronic respiratory failure..  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  Since this procedure was done in the past month he has slowly improved although he had quite a bit of coughing and dyspnea immediately following the procedure.  Follow-up chest x-ray did not show any pneumothorax.  The patient eventually will need to have inguinal hernia repair but needs to have some dental work first.  He needs to recover from the bronchial valve surgery for any of these procedures can occur.  His saturations when he walks 15 feet are more drop in the 80% range.  He is not been using his oxygen during the day with ambulation only at night.  At rest his oxygenation averages 98 to 94%.  He is using his Trelegy daily.  He does have his nebulizer.  He was given cough syrup and he has been coughing quite a bit but is been not wanting to use it.  He is encouraged to use the cough medication given by the [facility] lung physicians.  He is not having significant chest pain at this time.  He states in the last few days he is improved.  X-ray does show gradual deflation of the left upper lobe as intended. ? ?03/23/21 ?Patient returns for in person visit.  Patient's been to [facility] and has been seen by the pulmonary department and has had an improvement in his lung function after the pulmonary valves were placed his residual volume has gone down to 175% predicted from 211% predicted.  His FEV1 has increased from 29% predicted to 44% predicted.  He still wears oxygen at night.  Coughing spells have improved.  He still has occasional hemoptysis this is due  to the valve repositionings in the left upper lobe.  CT of the chest show progressive loss of volume as expected left upper lobe.  Patient needs lab screenings at this visit.  Patient has appoint with [facility] March 28 and also with a dental clinic there as well encouraged to keep both of his appointments.  He has a significant left inguinal hernia that needs to be repaired.  Barrier has been not being able to be cleared from a dental perspective and not having to undergo all his pulmonary work.  On arrival blood pressure is good 116/82 ?Patient still follows with dermatology for his psoriatic condition.  He is only on topical creams at this time.  They are looking to see if he can get molecular biological Medicaid will cover. ?Patient still with generalized anxiety and has had positive response to counseling with our clinical social ?Past Medical History:  ?Diagnosis Date  ? Anxiety   ? Asthma   ? COPD (chronic obstructive pulmonary disease) ([place])   ? Severe centrilobular and paraseptal bullous emphysema; Gold stage D; FEV1 and DLCO<30%.;  Currently undergoing transplant evaluation  ? Dyspnea   ? Environmental allergies   ? Esophagitis   ? Family history of adverse reaction to anesthesia   ? Paternal aunt had trouble waking  ? GERD (gastroesophageal reflux disease)   ? Headache   ? Hx of migraines   ?  OCD (obsessive compulsive disorder)   ? OCD (obsessive compulsive disorder)   ? Pneumonia   ? Pneumothorax on left 03/20/2020  ? Psoriasis   ? Sleep apnea   ? Urticaria   ? ? ?Past Surgical History:  ?Procedure Laterality Date  ? Endobronchial valve Left 03/20/2020  ? [facility]  ? KNEE SURGERY Right 2002  ? arthroscopy and cartilidge removed  ? KNEE SURGERY Left 1987  ? Ligaments removed and screw in place  ? ? ?Family History  ?Problem Relation Age of Onset  ? Multiple sclerosis Sister   ? Psoriasis Maternal Grandmother   ? Heart attack Mother   ?     Late in life  ? Heart attack Father 63  ? CAD Father    ? Heart attack Paternal Grandfather 57  ?     Sudden cardiac death  ? Sudden Cardiac Death Paternal Grandfather 75  ? Heart attack Maternal Uncle   ? CAD Maternal Uncle   ? Heart attack Cousin 32  ? Sudden Cardiac Death Cousin 58  ? Allergic rhinitis Neg Hx   ? Angioedema Neg Hx   ? Asthma Neg Hx   ? Eczema Neg Hx   ? Immunodeficiency Neg Hx   ? Urticaria Neg Hx   ? ? ?Social History  ? ?Socioeconomic History  ? Marital status: Significant Other  ?  Spouse name: Not on file  ? Number of children: Not on file  ? Years of education: Not on file  ? Highest education level: Not on file  ?Occupational History  ? Not on file  ?Tobacco Use  ? Smoking status: Former  ?  Packs/day: 1.00  ?  Years: 30.00  ?  Pack years: 30.00  ?  Types: Cigarettes  ?  Quit date: 01/19/2018  ?  Years since quitting: 3.1  ? Smokeless tobacco: Never  ?Vaping Use  ? Vaping Use: Never used  ?Substance and Sexual Activity  ? Alcohol use: No  ? Drug use: No  ? Sexual activity: Not Currently  ?Other Topics Concern  ? Not on file  ?Social History Narrative  ? Not on file  ? ?Social Determinants of Health  ? ?Financial Resource Strain: Not on file  ?Food Insecurity: Not on file  ?Transportation Needs: Not on file  ?Physical Activity: Not on file  ?Stress: Not on file  ?Social Connections: Not on file  ?Intimate Partner Violence: Not on file  ? ? ?Outpatient Medications Prior to Visit  ?Medication Sig Dispense Refill  ? benzonatate (TESSALON) 100 MG capsule Take 1 capsule (100 mg total) by mouth every 8 (eight) hours. 90 capsule 2  ? calcipotriene (DOVONOX) 0.005 % cream Apply topically 2 (two) times daily. 60 g 0  ? desonide (DESOWEN) 0.05 % cream Apply 1 application topically 2 (two) times daily as needed (psoriasis).    ? EPINEPHrine 0.3 mg/0.3 mL IJ SOAJ injection Inject 0.3 mg into the muscle as needed for anaphylaxis.    ? halobetasol (ULTRAVATE) 0.05 % cream Apply topically 2 (two) times daily. (Patient taking differently: Apply 1 application.  topically 2 (two) times daily as needed (irritation).) 50 g 0  ? hydrocortisone 2.5 % cream Apply 1 application topically 2 (two) times daily as needed (psoriasis).    ? Multiple Vitamin (MULTIVITAMIN WITH MINERALS) TABS tablet Take 1 tablet by mouth daily.    ? OXYGEN Inhale 2 L into the lungs continuous.    ? albuterol (VENTOLIN HFA) 108 (90 Base) MCG/ACT inhaler  Inhale 2 puffs into the lungs every 6 (six) hours as needed for wheezing or shortness of breath. 18 g 2  ? esomeprazole (NEXIUM) 40 MG capsule TAKE ONE CAPSULE BY MOUTH TWICE A DAY BEFORE A MEAL 60 capsule 2  ? Fluticasone-Umeclidin-Vilant (TRELEGY ELLIPTA) 200-62.5-25 MCG/ACT AEPB Inhale 1 puff into the lungs daily. 60 each 4  ? ipratropium-albuterol (DUONEB) 0.5-2.5 (3) MG/3ML SOLN Inhale 3 mLs into the lungs every 6 (six) hours as needed (shortness of breath). 360 mL 0  ? traZODone (DESYREL) 50 MG tablet Take 0.5-1 tablets (25-50 mg total) by mouth at bedtime as needed for sleep. 30 tablet 3  ? ?No facility-administered medications prior to visit.  ? ? ?Allergies  ?Allergen Reactions  ? Other Anaphylaxis  ?  Wool  ? Shellfish Allergy   ?  Unknown reaction  ? ? ?ROS ?Review of Systems  ?Constitutional: Negative.   ?HENT: Negative.  Negative for ear pain, postnasal drip, rhinorrhea, sinus pressure, sore throat, trouble swallowing and voice change.   ?Eyes: Negative.   ?Respiratory:  Positive for cough, shortness of breath and wheezing. Negative for apnea, choking, chest tightness and stridor.   ?Cardiovascular: Negative.  Negative for chest pain, palpitations and leg swelling.  ?Gastrointestinal:  Positive for abdominal pain. Negative for abdominal distention, nausea and vomiting.  ?     Left inguinal hernia still persists  ?Genitourinary: Negative.   ?Musculoskeletal: Negative.  Negative for arthralgias and myalgias.  ?Skin: Negative.  Negative for rash.  ?Allergic/Immunologic: Negative.  Negative for environmental allergies and food allergies.   ?Neurological: Negative.  Negative for dizziness, syncope, weakness and headaches.  ?Hematological: Negative.  Negative for adenopathy. Does not bruise/bleed easily.  ?Psychiatric/Behavioral: Negative.  Neg

## 2021-03-23 ENCOUNTER — Ambulatory Visit: Payer: Medicaid Other | Attending: Critical Care Medicine | Admitting: Critical Care Medicine

## 2021-03-23 ENCOUNTER — Ambulatory Visit: Payer: Medicaid Other | Attending: Critical Care Medicine | Admitting: Clinical

## 2021-03-23 ENCOUNTER — Other Ambulatory Visit: Payer: Self-pay

## 2021-03-23 ENCOUNTER — Encounter: Payer: Self-pay | Admitting: Critical Care Medicine

## 2021-03-23 VITALS — BP 116/82 | HR 84 | Wt 187.4 lb

## 2021-03-23 DIAGNOSIS — K4031 Unilateral inguinal hernia, with obstruction, without gangrene, recurrent: Secondary | ICD-10-CM | POA: Diagnosis not present

## 2021-03-23 DIAGNOSIS — K056 Periodontal disease, unspecified: Secondary | ICD-10-CM

## 2021-03-23 DIAGNOSIS — E785 Hyperlipidemia, unspecified: Secondary | ICD-10-CM

## 2021-03-23 DIAGNOSIS — J431 Panlobular emphysema: Secondary | ICD-10-CM

## 2021-03-23 DIAGNOSIS — E876 Hypokalemia: Secondary | ICD-10-CM

## 2021-03-23 DIAGNOSIS — L400 Psoriasis vulgaris: Secondary | ICD-10-CM | POA: Diagnosis not present

## 2021-03-23 DIAGNOSIS — Z139 Encounter for screening, unspecified: Secondary | ICD-10-CM

## 2021-03-23 DIAGNOSIS — G4733 Obstructive sleep apnea (adult) (pediatric): Secondary | ICD-10-CM | POA: Diagnosis not present

## 2021-03-23 DIAGNOSIS — F331 Major depressive disorder, recurrent, moderate: Secondary | ICD-10-CM | POA: Diagnosis not present

## 2021-03-23 DIAGNOSIS — F419 Anxiety disorder, unspecified: Secondary | ICD-10-CM | POA: Diagnosis not present

## 2021-03-23 MED ORDER — TRELEGY ELLIPTA 200-62.5-25 MCG/ACT IN AEPB
1.0000 | INHALATION_SPRAY | Freq: Every day | RESPIRATORY_TRACT | 4 refills | Status: DC
  Filled 2021-03-23 – 2021-04-14 (×2): qty 60, 30d supply, fill #0
  Filled 2021-06-04: qty 60, 30d supply, fill #1
  Filled 2021-08-24: qty 60, 30d supply, fill #2
  Filled 2021-09-17: qty 60, 30d supply, fill #3
  Filled 2021-11-11: qty 60, 30d supply, fill #4

## 2021-03-23 MED ORDER — TRAZODONE HCL 50 MG PO TABS
25.0000 mg | ORAL_TABLET | Freq: Every evening | ORAL | 3 refills | Status: DC | PRN
Start: 2021-03-23 — End: 2022-01-07
  Filled 2021-03-23 – 2021-04-14 (×2): qty 30, 30d supply, fill #0

## 2021-03-23 MED ORDER — IPRATROPIUM-ALBUTEROL 0.5-2.5 (3) MG/3ML IN SOLN
3.0000 mL | Freq: Four times a day (QID) | RESPIRATORY_TRACT | 0 refills | Status: DC | PRN
  Filled 2021-03-23: qty 360, 30d supply, fill #0

## 2021-03-23 MED ORDER — ALBUTEROL SULFATE HFA 108 (90 BASE) MCG/ACT IN AERS
2.0000 | INHALATION_SPRAY | Freq: Four times a day (QID) | RESPIRATORY_TRACT | 2 refills | Status: DC | PRN
Start: 2021-03-23 — End: 2021-11-11
  Filled 2021-03-23: qty 18, 53d supply, fill #0
  Filled 2021-04-14: qty 18, 25d supply, fill #0
  Filled 2021-06-04: qty 18, 25d supply, fill #1
  Filled 2021-09-17: qty 18, 25d supply, fill #2

## 2021-03-23 MED ORDER — ESOMEPRAZOLE MAGNESIUM 40 MG PO CPDR
DELAYED_RELEASE_CAPSULE | ORAL | 2 refills | Status: DC
  Filled 2021-03-23 – 2021-04-14 (×2): qty 60, 30d supply, fill #0
  Filled 2021-11-11: qty 60, 30d supply, fill #1

## 2021-03-23 NOTE — Patient Instructions (Addendum)
Refills on all medications sent to our pharmacy ? ?Complete screening labs obtained at this visit ? ?Return to see Dr. Delford Field 3 months with a video visit ? our ?

## 2021-03-23 NOTE — Assessment & Plan Note (Signed)
Mild improvement in pulmonary function FEV1 now at 44% predicted up from 29% predicted residual volume decreased now to 175 % of predicted from prior values of 211% predicted ? ?Continue Trelegy and pulmonary rehab continue oxygen ?

## 2021-03-23 NOTE — Assessment & Plan Note (Signed)
Encouraged to keep appointment with [facility] ?

## 2021-03-23 NOTE — Assessment & Plan Note (Signed)
Encouraged to continue to follow with dermatology ?

## 2021-03-23 NOTE — Assessment & Plan Note (Signed)
Continue with management using dietary control ?

## 2021-03-23 NOTE — Assessment & Plan Note (Signed)
Cannot use CPAP because of the placement of the valves in the lung continue nocturnal oxygen ?

## 2021-03-23 NOTE — Assessment & Plan Note (Signed)
Continue with counseling per clinical social work ?

## 2021-03-23 NOTE — Assessment & Plan Note (Signed)
Encouraged to keep dental appointment upcoming ?

## 2021-03-24 ENCOUNTER — Telehealth: Payer: Self-pay

## 2021-03-24 ENCOUNTER — Other Ambulatory Visit: Payer: Self-pay | Admitting: Critical Care Medicine

## 2021-03-24 ENCOUNTER — Other Ambulatory Visit: Payer: Self-pay

## 2021-03-24 ENCOUNTER — Other Ambulatory Visit (HOSPITAL_BASED_OUTPATIENT_CLINIC_OR_DEPARTMENT_OTHER): Payer: Self-pay

## 2021-03-24 LAB — LIPID PANEL
Chol/HDL Ratio: 5.6 ratio — ABNORMAL HIGH (ref 0.0–5.0)
Cholesterol, Total: 248 mg/dL — ABNORMAL HIGH (ref 100–199)
HDL: 44 mg/dL (ref >39)
LDL Chol Calc (NIH): 179 mg/dL — ABNORMAL HIGH (ref 0–99)
Triglycerides: 135 mg/dL (ref 0–149)
VLDL Cholesterol Cal: 25 mg/dL (ref 5–40)

## 2021-03-24 LAB — CBC WITH DIFFERENTIAL/PLATELET
Basophils Absolute: 0.1 10*3/uL (ref 0.0–0.2)
Basos: 1 %
EOS (ABSOLUTE): 0.2 10*3/uL (ref 0.0–0.4)
Eos: 2 %
Hematocrit: 44.9 % (ref 37.5–51.0)
Hemoglobin: 15.5 g/dL (ref 13.0–17.7)
Immature Grans (Abs): 0.1 10*3/uL (ref 0.0–0.1)
Immature Granulocytes: 1 %
Lymphocytes Absolute: 2.1 10*3/uL (ref 0.7–3.1)
Lymphs: 20 %
MCH: 30.4 pg (ref 26.6–33.0)
MCHC: 34.5 g/dL (ref 31.5–35.7)
MCV: 88 fL (ref 79–97)
Monocytes Absolute: 1 10*3/uL — ABNORMAL HIGH (ref 0.1–0.9)
Monocytes: 9 %
Neutrophils Absolute: 7.3 10*3/uL — ABNORMAL HIGH (ref 1.4–7.0)
Neutrophils: 67 %
Platelets: 325 10*3/uL (ref 150–450)
RBC: 5.1 x10E6/uL (ref 4.14–5.80)
RDW: 11.7 % (ref 11.6–15.4)
WBC: 10.8 10*3/uL (ref 3.4–10.8)

## 2021-03-24 LAB — COMPREHENSIVE METABOLIC PANEL
ALT: 15 IU/L (ref 0–44)
AST: 18 IU/L (ref 0–40)
Albumin/Globulin Ratio: 1.7 (ref 1.2–2.2)
Albumin: 4.4 g/dL (ref 3.8–4.9)
Alkaline Phosphatase: 104 IU/L (ref 44–121)
BUN/Creatinine Ratio: 12 (ref 9–20)
BUN: 16 mg/dL (ref 6–24)
Bilirubin Total: 0.3 mg/dL (ref 0.0–1.2)
CO2: 26 mmol/L (ref 20–29)
Calcium: 9.6 mg/dL (ref 8.7–10.2)
Chloride: 100 mmol/L (ref 96–106)
Creatinine, Ser: 1.31 mg/dL — ABNORMAL HIGH (ref 0.76–1.27)
Globulin, Total: 2.6 g/dL (ref 1.5–4.5)
Glucose: 110 mg/dL — ABNORMAL HIGH (ref 70–99)
Potassium: 4.2 mmol/L (ref 3.5–5.2)
Sodium: 141 mmol/L (ref 134–144)
Total Protein: 7 g/dL (ref 6.0–8.5)
eGFR: 65 mL/min/{1.73_m2} (ref >59)

## 2021-03-24 LAB — THYROID PANEL WITH TSH
Free Thyroxine Index: 2.3 (ref 1.2–4.9)
T3 Uptake Ratio: 28 % (ref 24–39)
T4, Total: 8.2 ug/dL (ref 4.5–12.0)
TSH: 1.64 u[IU]/mL (ref 0.450–4.500)

## 2021-03-24 MED ORDER — ATORVASTATIN CALCIUM 20 MG PO TABS
20.0000 mg | ORAL_TABLET | Freq: Every day | ORAL | 3 refills | Status: DC
  Filled 2021-03-24 – 2021-04-14 (×2): qty 90, 90d supply, fill #0

## 2021-03-24 MED ORDER — ATORVASTATIN CALCIUM 20 MG PO TABS
20.0000 mg | ORAL_TABLET | Freq: Every day | ORAL | 3 refills | Status: DC
  Filled 2021-03-24: qty 90, 90d supply, fill #0

## 2021-03-24 NOTE — Telephone Encounter (Signed)
-----   Message from Storm Frisk, MD sent at 03/24/2021  5:48 AM EDT ----- ?Let pt know kidney liver ok, blood counts normal, cholesterol very high, I recommend starting atorvastatin 20mg  daily ,sent to pharmacy,  thyroid is normal ?

## 2021-03-24 NOTE — Telephone Encounter (Signed)
Pt was called and is aware of results, DOB was confirmed.  ?

## 2021-03-25 ENCOUNTER — Other Ambulatory Visit: Payer: Self-pay | Admitting: Physical Medicine and Rehabilitation

## 2021-03-25 DIAGNOSIS — M25561 Pain in right knee: Secondary | ICD-10-CM

## 2021-03-25 NOTE — BH Specialist Note (Signed)
Integrated Behavioral Health Follow Up In-Person Visit ? ?MRN: [id number] ?Name: Fleet Riggan ? ?Number of [place] Clinician visits: 2- Second Visit ? ?Session Start time: [id number] ?  ?Session End time: [id number] ? ?Total time in minutes: 10 ? ? ?Types of Service: Individual psychotherapy ? ?Interpretor:No. Interpretor Name and Language: N/A ? ?Subjective: ?Add Jezewski is a 51 y.o. male accompanied by  self ?Patient was referred by Asencion Noble, MD for depression and anxiety. ?Patient reports the following symptoms/concerns: Reports feeling depressed, anxiousness, worrying, and self-esteem disturbances. Reports feeling alone at times. Reports a hx of trauma.  ?Duration of problem: 2 years+; Severity of problem: severe ? ?Objective: ?Mood: Anxious and Depressed and Affect: Appropriate ?Risk of harm to self or others: No plan to harm self or others ? ?Life Context: ?Family and Social: Pt receives support from his aunt. ?School/Work: Pt is unemployed and receives disability. ?Self-Care: Pt reports limited self-care. ?Life Changes: Pt has had multiple surgeries due to emphysema and COPD.  ? ?Patient and/or Family's Strengths/Protective Factors: ?Social and Emotional competence ? ?Goals Addressed: ?Patient will: ? Reduce symptoms of: anxiety and depression  ? Increase knowledge and/or ability of: coping skills  ? Demonstrate ability to: Increase healthy adjustment to current life circumstances ? ?Progress towards Goals: ?Ongoing ? ?Interventions: ?Interventions utilized:  Optician, dispensing, CBT Cognitive Behavioral Therapy, and Supportive Counseling ?Standardized Assessments completed: Not Needed ? ?Patient and/or Family Response: Pt receptive to tx. Pt receptive to psychoeducation provided on trauma. Pt receptive to cognitive restructuring. Pt receptive to considering additional enjoyable activities and deep breathing.  ? ?Patient Centered Plan: ?Patient is on the following Treatment  Plan(s): Depression ? ?Assessment: ?Denies SI/HI. Patient currently experiencing anxiety and depression. Pt continues to adjust to physical health. Pt also has significant hx of trauma. Pt frequently spends time alone.  ? ?Patient may benefit from outpatient therapy. Pt referred to [facility] and is waiting on appt to be scheduled for therapy. LCSW provided psychoeducation on trauma. LCSW utilized Adult nurse. LCSW encouraged pt to identify additional enjoyable activities and deep breathing. LCSW will fu with pt. ? ?Plan: ?Follow up with behavioral health clinician on : 04/13/21 ?Behavioral recommendations: Identify additional enjoyable activities and utilize deep breathing. ?Referral(s): [place] (In Clinic) ?"From scale of 1-10, how likely are you to follow plan?": 10 ? ?Jahlon Baines C Brookelyn Gaynor, LCSW ? ? ?

## 2021-03-30 ENCOUNTER — Other Ambulatory Visit: Payer: Self-pay

## 2021-03-31 ENCOUNTER — Other Ambulatory Visit: Payer: Self-pay

## 2021-03-31 DIAGNOSIS — J449 Chronic obstructive pulmonary disease, unspecified: Secondary | ICD-10-CM | POA: Diagnosis not present

## 2021-03-31 DIAGNOSIS — Z9981 Dependence on supplemental oxygen: Secondary | ICD-10-CM | POA: Diagnosis not present

## 2021-03-31 DIAGNOSIS — K4090 Unilateral inguinal hernia, without obstruction or gangrene, not specified as recurrent: Secondary | ICD-10-CM | POA: Diagnosis not present

## 2021-03-31 DIAGNOSIS — K029 Dental caries, unspecified: Secondary | ICD-10-CM | POA: Diagnosis not present

## 2021-04-04 DIAGNOSIS — J449 Chronic obstructive pulmonary disease, unspecified: Secondary | ICD-10-CM | POA: Diagnosis not present

## 2021-04-09 DIAGNOSIS — L400 Psoriasis vulgaris: Secondary | ICD-10-CM | POA: Diagnosis not present

## 2021-04-13 ENCOUNTER — Ambulatory Visit (HOSPITAL_BASED_OUTPATIENT_CLINIC_OR_DEPARTMENT_OTHER): Payer: Medicaid Other | Admitting: Clinical

## 2021-04-13 DIAGNOSIS — F331 Major depressive disorder, recurrent, moderate: Secondary | ICD-10-CM | POA: Diagnosis not present

## 2021-04-14 ENCOUNTER — Ambulatory Visit
Admission: RE | Admit: 2021-04-14 | Discharge: 2021-04-14 | Disposition: A | Discharge status: Home / Self Care | Payer: Medicaid Other | Source: Ambulatory Visit | Attending: Physical Medicine and Rehabilitation | Admitting: Physical Medicine and Rehabilitation

## 2021-04-14 ENCOUNTER — Ambulatory Visit: Payer: Medicaid Other | Attending: Critical Care Medicine

## 2021-04-14 ENCOUNTER — Other Ambulatory Visit: Payer: Self-pay | Admitting: Physical Medicine and Rehabilitation

## 2021-04-14 ENCOUNTER — Other Ambulatory Visit (HOSPITAL_BASED_OUTPATIENT_CLINIC_OR_DEPARTMENT_OTHER): Payer: Self-pay

## 2021-04-14 DIAGNOSIS — M7121 Synovial cyst of popliteal space [Baker], right knee: Secondary | ICD-10-CM | POA: Diagnosis not present

## 2021-04-14 DIAGNOSIS — M25461 Effusion, right knee: Secondary | ICD-10-CM | POA: Diagnosis not present

## 2021-04-14 DIAGNOSIS — M25561 Pain in right knee: Secondary | ICD-10-CM

## 2021-04-14 DIAGNOSIS — M1711 Unilateral primary osteoarthritis, right knee: Secondary | ICD-10-CM | POA: Diagnosis not present

## 2021-04-21 ENCOUNTER — Encounter: Payer: Self-pay | Admitting: Critical Care Medicine

## 2021-04-21 DIAGNOSIS — Z111 Encounter for screening for respiratory tuberculosis: Secondary | ICD-10-CM

## 2021-04-22 NOTE — Telephone Encounter (Signed)
Pt needs lab visit scheduled for TB test, I put order in for a blood draw, call pt with time  ?

## 2021-04-24 NOTE — BH Specialist Note (Signed)
Integrated Behavioral Health via Telemedicine Visit ? ?04/13/2021 ?Richard Fuentes ?[id number] ? ?Number of Integrated Behavioral Health Clinician visits: 3- Third Visit ? ?Session Start time: 0845 ?  ?Session End time: [phone] ? ?Total time in minutes: 57 ? ? ?Referring Provider: Shan Levans, MD ?Patient/Family location: Home ?[facility] Provider location: [facility] office ?All persons participating in visit: Pt and LCSW ?Types of Service: Individual psychotherapy and Video visit ? ?I connected with Billie Ruddy via Video Enabled Telemedicine Application  (Video is Caregility application) and verified that I am speaking with the correct person using two identifiers. Discussed confidentiality: Yes  ? ?I discussed the limitations of telemedicine and the availability of in person appointments.  Discussed there is a possibility of technology failure and discussed alternative modes of communication if that failure occurs. ? ?I discussed that engaging in this telemedicine visit, they consent to the provision of behavioral healthcare and the services will be billed under their insurance. ? ?Patient and/or legal guardian expressed understanding and consented to Telemedicine visit: Yes  ? ?Presenting Concerns: ?Patient and/or family reports the following symptoms/concerns: Reports feeling depressed and worrying about his health. Reports concerns with hx of OCD. Reports that he does not want to know what it feels like to not complete an obsession and compulsion.  ?Duration of problem: 2 years+; Severity of problem: moderate ? ?Patient and/or Family's Strengths/Protective Factors: ?Social and Emotional competence ? ?Goals Addressed: ?Patient will: ? Reduce symptoms of: anxiety and depression  ? Increase knowledge and/or ability of: coping skills  ? Demonstrate ability to: Increase healthy adjustment to current life circumstances ? ?Progress towards Goals: ?Ongoing ? ?Interventions: ?Interventions utilized:  Set designer, Mining engineer, CBT Cognitive Behavioral Therapy, and Supportive Counseling ?Standardized Assessments completed: Not Needed ? ?Patient and/or Family Response: Pt receptive to tx. Pt receptive to psychoeducation provided on depression, anxiety, obsessions, and compulsions. Pt receptive to cognitive restructuring.  ? ?Assessment: ?Denies SI/HI. Patient currently experiencing anxiety and depression. Pt also has a hx of OCD. Pt still spends time alone.  ? ?Patient may benefit from outpatient therapy and engaging in enjoyable activities. LCSW provided psychoeducation on depression, anxiety, obsessions, and compulsions. LCSW utilized cognitive restructuring to decrease negative thoughts and assisted with cognitive processing. LCSW encouraged pt to engage in enjoyable activities. Pt has been referred to [facility]. LCSW discussed her departure from [facility] with pt. LCSW encouraged pt to continue therapy with [facility]. ? ?Plan: ?Follow up with behavioral health clinician on : Continue therapy with [facility] ?Behavioral recommendations: Engage in enjoyable activities. ?Referral(s): Counselor ? ?I discussed the assessment and treatment plan with the patient and/or parent/guardian. They were provided an opportunity to ask questions and all were answered. They agreed with the plan and demonstrated an understanding of the instructions. ?  ?They were advised to call back or seek an in-person evaluation if the symptoms worsen or if the condition fails to improve as anticipated. ? ?Lyrick Worland C Yoceline Bazar, LCSW ?

## 2021-04-28 ENCOUNTER — Ambulatory Visit: Payer: Medicaid Other | Attending: Critical Care Medicine

## 2021-04-28 DIAGNOSIS — Z111 Encounter for screening for respiratory tuberculosis: Secondary | ICD-10-CM | POA: Diagnosis not present

## 2021-04-28 DIAGNOSIS — M25561 Pain in right knee: Secondary | ICD-10-CM | POA: Diagnosis not present

## 2021-04-30 DIAGNOSIS — M25561 Pain in right knee: Secondary | ICD-10-CM | POA: Diagnosis not present

## 2021-05-02 ENCOUNTER — Encounter (INDEPENDENT_AMBULATORY_CARE_PROVIDER_SITE_OTHER): Payer: Self-pay

## 2021-05-02 LAB — QUANTIFERON-TB GOLD PLUS
QuantiFERON Mitogen Value: >10 IU/mL
QuantiFERON Nil Value: 0.44 IU/mL
QuantiFERON TB1 Ag Value: 0.4 IU/mL
QuantiFERON TB2 Ag Value: 0.43 IU/mL
QuantiFERON-TB Gold Plus: NEGATIVE

## 2021-05-04 DIAGNOSIS — J449 Chronic obstructive pulmonary disease, unspecified: Secondary | ICD-10-CM | POA: Diagnosis not present

## 2021-05-11 ENCOUNTER — Encounter: Payer: Self-pay | Admitting: Critical Care Medicine

## 2021-05-14 NOTE — Telephone Encounter (Signed)
Pt needs to be worked in next week double book for copd exam ?

## 2021-05-18 NOTE — Progress Notes (Signed)
? ?Established Patient Office Visit ? ?Subjective:  ?Patient ID: Richard Fuentes, male    DOB: 1970/03/26  Age: 51 y.o. MRN: [id number] ?  ?CC: Primary care follow-up  ?HPI ?01/27/21 ?Richard Fuentes presents for primary care follow-up history of severe COPD with significant centrilobular emphysema and chronic respiratory failure..  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  Since this procedure was done in the past month he has slowly improved although he had quite a bit of coughing and dyspnea immediately following the procedure.  Follow-up chest x-ray did not show any pneumothorax.  The patient eventually will need to have inguinal hernia repair but needs to have some dental work first.  He needs to recover from the bronchial valve surgery for any of these procedures can occur.  His saturations when he walks 15 feet are more drop in the 80% range.  He is not been using his oxygen during the day with ambulation only at night.  At rest his oxygenation averages 98 to 94%.  He is using his Trelegy daily.  He does have his nebulizer.  He was given cough syrup and he has been coughing quite a bit but is been not wanting to use it.  He is encouraged to use the cough medication given by the [facility] lung physicians.  He is not having significant chest pain at this time.  He states in the last few days he is improved.  X-ray does show gradual deflation of the left upper lobe as intended. ? ?03/23/21 ?Patient returns for in person visit.  Patient's been to [facility] and has been seen by the pulmonary department and has had an improvement in his lung function after the pulmonary valves were placed his residual volume has gone down to 175% predicted from 211% predicted.  His FEV1 has increased from 29% predicted to 44% predicted.  He still wears oxygen at night.  Coughing spells have improved.  He still has occasional hemoptysis this is due  to the valve repositionings in the left upper lobe.  CT of the chest show progressive loss of volume as expected left upper lobe.  Patient needs lab screenings at this visit.  Patient has appoint with [facility] general surgery March 28 and also with a dental clinic there as well encouraged to keep both of his appointments.  He has a significant left inguinal hernia that needs to be repaired.  Barrier has been not being able to be cleared from a dental perspective and not having to undergo all his pulmonary work.  On arrival blood pressure is good 116/82 ?Patient still follows with dermatology for his psoriatic condition.  He is only on topical creams at this time.  They are looking to see if he can get molecular biological Medicaid will cover. ?Patient still with generalized anxiety and has had positive response to counseling with our clinical social ? ?5/16 ?This patient is seen in short-term follow-up and had some exacerbation of his COPD but it self resolved.  Mucus is minimal and production.  He has bronchial valves in the left upper lobe with lung volume reduction.  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  He is requesting physical therapy. ? ?Dermatology needs to treat psoriasis he did have a negative QuantiFERON gold he will send them the report. ? ?Patient has referral to a  new mental health team at [place] he is making this appointment. ? ?Hernia surgeries been put on hold till he gets further dental work he does have a dental appointment May 21 he is encouraged to keep this appointment. ? ?Patient has no other complaints. ? ?On arrival blood pressure 104/72 ? ?Past Medical History:  ?Diagnosis Date  ? Anxiety   ? Asthma   ? COPD (chronic obstructive pulmonary disease) ([place])   ? Severe centrilobular and paraseptal bullous emphysema; Gold stage D; FEV1 and DLCO<30%.;  Currently undergoing transplant evaluation  ? Dyspnea   ? Environmental  allergies   ? Esophagitis   ? Family history of adverse reaction to anesthesia   ? Paternal aunt had trouble waking  ? GERD (gastroesophageal reflux disease)   ? Headache   ? Hx of migraines   ? OCD (obsessive compulsive disorder)   ? OCD (obsessive compulsive disorder)   ? Pneumonia   ? Pneumothorax on left 03/20/2020  ? Psoriasis   ? Sleep apnea   ? Urticaria   ? ? ?Past Surgical History:  ?Procedure Laterality Date  ? Endobronchial valve Left 03/20/2020  ? LUL at [facility]  ? KNEE SURGERY Right 2002  ? arthroscopy and cartilidge removed  ? KNEE SURGERY Left 1987  ? Ligaments removed and screw in place  ? ? ?Family History  ?Problem Relation Age of Onset  ? Multiple sclerosis Sister   ? Psoriasis Maternal Grandmother   ? Heart attack Mother   ?     Late in life  ? Heart attack Father 28  ? CAD Father   ? Heart attack Paternal Grandfather 84  ?     Sudden cardiac death  ? Sudden Cardiac Death Paternal Grandfather 30  ? Heart attack Maternal Uncle   ? CAD Maternal Uncle   ? Heart attack Cousin 32  ? Sudden Cardiac Death Cousin 67  ? Allergic rhinitis Neg Hx   ? Angioedema Neg Hx   ? Asthma Neg Hx   ? Eczema Neg Hx   ? Immunodeficiency Neg Hx   ? Urticaria Neg Hx   ? ? ?Social History  ? ?Socioeconomic History  ? Marital status: Significant Other  ?  Spouse name: Not on file  ? Number of children: Not on file  ? Years of education: Not on file  ? Highest education level: Not on file  ?Occupational History  ? Not on file  ?Tobacco Use  ? Smoking status: Former  ?  Packs/day: 1.00  ?  Years: 30.00  ?  Pack years: 30.00  ?  Types: Cigarettes  ?  Quit date: 01/19/2018  ?  Years since quitting: 3.3  ? Smokeless tobacco: Never  ?Vaping Use  ? Vaping Use: Never used  ?Substance and Sexual Activity  ? Alcohol use: No  ? Drug use: No  ? Sexual activity: Not Currently  ?Other Topics Concern  ? Not on file  ?Social History Narrative  ? Not on file  ? ?Social Determinants of Health  ? ?Financial Resource Strain: Not on file  ?Food  Insecurity: Not on file  ?Transportation Needs: Not on file  ?Physical Activity: Not on file  ?Stress: Not on file  ?Social Connections: Not on file  ?Intimate Partner Violence: Not on file  ? ? ?Outpatient Medications Prior to Visit  ?Medication Sig Dispense Refill  ? albuterol (VENTOLIN HFA) 108 (90 Base) MCG/ACT inhaler Inhale 2 puffs into the lungs every 6 (six) hours as needed for wheezing  or shortness of breath. 18 g 2  ? atorvastatin (LIPITOR) 20 MG tablet Take 1 tablet (20 mg total) by mouth daily. 90 tablet 3  ? benzonatate (TESSALON) 100 MG capsule Take 1 capsule (100 mg total) by mouth every 8 (eight) hours. 90 capsule 2  ? calcipotriene (DOVONOX) 0.005 % cream Apply topically 2 (two) times daily. 60 g 0  ? desonide (DESOWEN) 0.05 % cream Apply 1 application topically 2 (two) times daily as needed (psoriasis).    ? EPINEPHrine 0.3 mg/0.3 mL IJ SOAJ injection Inject 0.3 mg into the muscle as needed for anaphylaxis.    ? esomeprazole (NEXIUM) 40 MG capsule TAKE ONE CAPSULE BY MOUTH TWICE A DAY BEFORE A MEAL 60 capsule 2  ? Fluticasone-Umeclidin-Vilant (TRELEGY ELLIPTA) 200-62.5-25 MCG/ACT AEPB Inhale 1 puff into the lungs daily. 60 each 4  ? halobetasol (ULTRAVATE) 0.05 % cream Apply topically 2 (two) times daily. (Patient taking differently: Apply 1 application. topically 2 (two) times daily as needed (irritation).) 50 g 0  ? hydrocortisone 2.5 % cream Apply 1 application topically 2 (two) times daily as needed (psoriasis).    ? ipratropium-albuterol (DUONEB) 0.5-2.5 (3) MG/3ML SOLN Inhale 3 mLs into the lungs every 6 (six) hours as needed (shortness of breath). 360 mL 0  ? Multiple Vitamin (MULTIVITAMIN WITH MINERALS) TABS tablet Take 1 tablet by mouth daily.    ? OXYGEN Inhale 2 L into the lungs continuous.    ? traZODone (DESYREL) 50 MG tablet Take 0.5-1 tablets (25-50 mg total) by mouth at bedtime as needed for sleep. 30 tablet 3  ? ?No facility-administered medications prior to visit.  ? ? ?Allergies   ?Allergen Reactions  ? Other Anaphylaxis  ?  Wool  ? Shellfish Allergy   ?  Unknown reaction  ? ? ?ROS ?Review of Systems  ?Constitutional: Negative.   ?HENT: Negative.  Negative for ear pain, postnasal drip,

## 2021-05-19 ENCOUNTER — Encounter: Payer: Self-pay | Admitting: Critical Care Medicine

## 2021-05-19 ENCOUNTER — Ambulatory Visit: Payer: Medicaid Other | Attending: Critical Care Medicine | Admitting: Critical Care Medicine

## 2021-05-19 ENCOUNTER — Ambulatory Visit
Admission: RE | Admit: 2021-05-19 | Discharge: 2021-05-19 | Disposition: A | Discharge status: Home / Self Care | Payer: Medicaid Other | Source: Ambulatory Visit | Attending: Critical Care Medicine | Admitting: Critical Care Medicine

## 2021-05-19 VITALS — BP 104/72 | HR 80

## 2021-05-19 DIAGNOSIS — J3089 Other allergic rhinitis: Secondary | ICD-10-CM

## 2021-05-19 DIAGNOSIS — J431 Panlobular emphysema: Secondary | ICD-10-CM | POA: Diagnosis not present

## 2021-05-19 DIAGNOSIS — R053 Chronic cough: Secondary | ICD-10-CM

## 2021-05-19 DIAGNOSIS — F411 Generalized anxiety disorder: Secondary | ICD-10-CM

## 2021-05-19 DIAGNOSIS — K056 Periodontal disease, unspecified: Secondary | ICD-10-CM | POA: Diagnosis not present

## 2021-05-19 DIAGNOSIS — L400 Psoriasis vulgaris: Secondary | ICD-10-CM

## 2021-05-19 DIAGNOSIS — M1711 Unilateral primary osteoarthritis, right knee: Secondary | ICD-10-CM | POA: Diagnosis not present

## 2021-05-19 DIAGNOSIS — R39198 Other difficulties with micturition: Secondary | ICD-10-CM

## 2021-05-19 DIAGNOSIS — R059 Cough, unspecified: Secondary | ICD-10-CM | POA: Diagnosis not present

## 2021-05-19 NOTE — Patient Instructions (Signed)
No change in inhaled medications ? ?Physical therapy referral for your right knee pain will be made ? ?Follow-up with your dental work and then after that follow-up with your general surgeon on your hernia ? ?Referral to urology was made for your difficulty in urination ? ?Chest x-ray will be obtained today stop by the x-ray department on the first floor of this building to get your x-ray before you leave campus ? ?Follow-up with your new mental health provider at [facility] ? ?Return to see Dr. Delford Field 4 months ?

## 2021-05-19 NOTE — Assessment & Plan Note (Signed)
Patient has existing referral with [facility] ?

## 2021-05-19 NOTE — Assessment & Plan Note (Signed)
Patient with follow-up with dental care ?

## 2021-05-19 NOTE — Assessment & Plan Note (Signed)
Continue home-based pulmonary rehab ? ?Continue Trelegy ? ?No change in medications ? ?Obtain chest x-ray ? ? ?

## 2021-05-19 NOTE — Assessment & Plan Note (Signed)
Continue nasal treatment program ?

## 2021-05-19 NOTE — Assessment & Plan Note (Signed)
Patient given results of QuantiFERON gold study ?

## 2021-05-20 DIAGNOSIS — F429 Obsessive-compulsive disorder, unspecified: Secondary | ICD-10-CM | POA: Diagnosis not present

## 2021-05-21 ENCOUNTER — Other Ambulatory Visit (HOSPITAL_BASED_OUTPATIENT_CLINIC_OR_DEPARTMENT_OTHER): Payer: Self-pay

## 2021-05-21 MED ORDER — PAROXETINE HCL 10 MG PO TABS
10.0000 mg | ORAL_TABLET | Freq: Every morning | ORAL | 0 refills | Status: DC
  Filled 2021-05-21: qty 30, 30d supply, fill #0

## 2021-05-27 DIAGNOSIS — K1379 Other lesions of oral mucosa: Secondary | ICD-10-CM | POA: Diagnosis not present

## 2021-05-27 DIAGNOSIS — K029 Dental caries, unspecified: Secondary | ICD-10-CM | POA: Diagnosis not present

## 2021-05-27 DIAGNOSIS — K056 Periodontal disease, unspecified: Secondary | ICD-10-CM | POA: Diagnosis not present

## 2021-05-27 DIAGNOSIS — K047 Periapical abscess without sinus: Secondary | ICD-10-CM | POA: Diagnosis not present

## 2021-06-04 ENCOUNTER — Other Ambulatory Visit (HOSPITAL_BASED_OUTPATIENT_CLINIC_OR_DEPARTMENT_OTHER): Payer: Self-pay

## 2021-06-04 ENCOUNTER — Encounter (HOSPITAL_BASED_OUTPATIENT_CLINIC_OR_DEPARTMENT_OTHER): Payer: Self-pay | Admitting: Pharmacist

## 2021-06-04 ENCOUNTER — Other Ambulatory Visit: Payer: Self-pay | Admitting: Critical Care Medicine

## 2021-06-04 DIAGNOSIS — J449 Chronic obstructive pulmonary disease, unspecified: Secondary | ICD-10-CM | POA: Diagnosis not present

## 2021-06-04 MED ORDER — EPINEPHRINE 0.3 MG/0.3ML IJ SOAJ
0.3000 mg | INTRAMUSCULAR | 1 refills | Status: DC | PRN
  Filled 2021-06-04: qty 2, 3d supply, fill #0

## 2021-06-05 ENCOUNTER — Ambulatory Visit: Payer: Medicaid Other | Attending: Critical Care Medicine | Admitting: Physical Therapy

## 2021-06-05 ENCOUNTER — Encounter: Payer: Self-pay | Admitting: Physical Therapy

## 2021-06-05 ENCOUNTER — Other Ambulatory Visit (HOSPITAL_BASED_OUTPATIENT_CLINIC_OR_DEPARTMENT_OTHER): Payer: Self-pay

## 2021-06-05 DIAGNOSIS — R2681 Unsteadiness on feet: Secondary | ICD-10-CM | POA: Diagnosis not present

## 2021-06-05 DIAGNOSIS — M1711 Unilateral primary osteoarthritis, right knee: Secondary | ICD-10-CM | POA: Diagnosis not present

## 2021-06-05 DIAGNOSIS — M25561 Pain in right knee: Secondary | ICD-10-CM | POA: Diagnosis not present

## 2021-06-05 DIAGNOSIS — G8929 Other chronic pain: Secondary | ICD-10-CM | POA: Diagnosis not present

## 2021-06-05 NOTE — Therapy (Signed)
OUTPATIENT PHYSICAL THERAPY LOWER EXTREMITY EVALUATION   Patient Name: Richard Fuentes MRN: [id number] DOB:[birth year], 51 y.o., male Today's Date: 06/05/2021   PT End of Session - 06/05/21 1154     Visit Number 1    Number of Visits --   1-2x/week   Date for PT Re-Evaluation 07/31/21    Authorization Type [employer]    PT Start Time 1100    PT Stop Time 1138    PT Time Calculation (min) 38 min             Past Medical History:  Diagnosis Date   Anxiety    Asthma    COPD (chronic obstructive pulmonary disease) ([place])    Severe centrilobular and paraseptal bullous emphysema; Gold stage D; FEV1 and DLCO<30%.;  Currently undergoing transplant evaluation   Dyspnea    Environmental allergies    Esophagitis    Family history of adverse reaction to anesthesia    Paternal aunt had trouble waking   GERD (gastroesophageal reflux disease)    Headache    Hx of migraines    OCD (obsessive compulsive disorder)    OCD (obsessive compulsive disorder)    Pneumonia    Pneumothorax on left 03/20/2020   Psoriasis    Sleep apnea    Urticaria    Past Surgical History:  Procedure Laterality Date   Endobronchial valve Left 03/20/2020   LUL at [place] Right 2002   arthroscopy and cartilidge removed   KNEE SURGERY Left 1987   Ligaments removed and screw in place   Patient Active Problem List   Diagnosis Date Noted   Allergic contact dermatitis due to metals 01/07/2020   OSA (obstructive sleep apnea) 09/13/2019   Hyperlipidemia with target LDL less than 100 08/20/2019   Family history of premature coronary artery disease 08/20/2019   Anxiety 08/01/2019   Perennial and seasonal allergic rhinitis 07/24/2019   Recurrent urticaria 07/24/2019   History of penicillin allergy 07/24/2019   History of food allergy 07/24/2019   Plaque psoriasis 06/05/2019   Chronic periodontal disease 03/21/2019   COPD with emphysema (HCC)GOLD D  12/07/2017   Inguinal hernia with obstruction  without gangrene 02/04/2017   Generalized anxiety disorder 12/07/2011   History of tobacco use 12/07/2011    PCP: Richard Stain, MD  REFERRING PROVIDER: Elsie Stain, MD  THERAPY DIAG:  Chronic pain of right knee - Plan: PT plan of care cert/re-cert  Unsteadiness on feet - Plan: PT plan of care cert/re-cert  REFERRING DIAG: Referral diagnosis: Primary osteoarthritis of right knee [M17.11]  Rationale for Evaluation and Treatment Rehabilitation  SUBJECTIVE:  PERTINENT PAST HISTORY:  Severe COPD (watch O2 sat), Emphysema, Dyspnea, OCD, inguinal hernia, prior R knee surgery with "cartilage removal"      PRECAUTIONS: Severe COPD (watch O2 sat), Emphysema, Dyspnea, OCD, inguinal hernia  WEIGHT BEARING RESTRICTIONS No  FALLS:  Has patient fallen in last 6 months? No, Number of falls: 0  MOI/History of condition:  Onset date: > 3 years  Richard Fuentes is a 51 y.o. male who presents to clinic with chief complaint of chronic R knee pain which has slowly gotten worse.  He does endorse some locking.  He has difficulty sitting cross legged and then returning to a neutral position (he has to "pop" his knee back into place).  He relates that the pain is not severe, but his knee feels unstable.  History of L and R knee issues with "5 surgeries" on L  knee and 1 arthroscopic on R.   Red flags:  denies   Pain:  Are you having pain? No Pain location: Lateral diffuse knee pain NPRS scale:  current 0/10  average 3/10  Aggravating factors: squatting, crossing leg, walking  NPRS, highest: 3/10 Relieving factors: "popping knee back into place" rest  NPRS: best: 0/10 Pain description: intermittent Stage: Chronic Stability: staying the same 24 hour pattern: NA   Occupation: NA  Assistive Device: NA  Hand Dominance: NA  Patient Goals/Specific Activities: Feel like knee is more stable   OBJECTIVE:   DIAGNOSTIC FINDINGS:  IMPRESSION: 1.  No evidence of meniscal tear.    2.  Cruciate and collateral ligaments are intact.   3. Tricompartmental degenerative changes of the articular cartilage without evidence of full-thickness defect.   4.  Small joint effusion with synovitis.   5.  Small Baker's cyst.   6.  No evidence of fracture or dislocation.   GENERAL OBSERVATION/GAIT:   R hip drop in gait, toe out with some resulting valgus collapse of bil knees  SENSATION:  Light touch: Appears intact  PALPATION: No TTP  MUSCLE LENGTH: Hamstrings: Right significant restriction; Left significant restriction ASLR: Right ASLR = PSLR; Left ASLR = PSLR Ely's test: Right moderate restriction; Left moderate restriction  LE MMT:  MMT Right 06/05/2021 Left 06/05/2021  Hip flexion (L2, L3)    Knee extension (L3) 3+ 3+  Knee flexion 4 4  Hip abduction 3+ 3+  Hip extension 3 3+  Hip external rotation    Hip internal rotation    Hip adduction    Ankle dorsiflexion (L4)    Ankle plantarflexion (S1)    Ankle inversion    Ankle eversion    Great Toe ext (L5)    Grossly     (Blank rows = not tested, score listed is out of 5 possible points.  N = WNL, D = diminished, C = clear for gross weakness with myotome testing, * = concordant pain with testing)  LE ROM:  ROM Right 06/05/2021 Left 06/05/2021  Hip flexion    Hip extension    Hip abduction    Hip adduction    Hip internal rotation limited limited  Hip external rotation N N  Knee flexion N N  Knee extension N N  Ankle dorsiflexion    Ankle plantarflexion    Ankle inversion    Ankle eversion     (Blank rows = not tested, N = WNL, * = concordant pain with testing)  Functional Tests  Eval (06/05/2021)    Progressive balance screen (highest level completed for >/= 10''):  Tandem: R in rear 3'', L in rear 10'' SLS: R unable, L unable                                                           LOWER EXTREMITY SPECIAL TESTS:  Knee special tests: ligamentous testing and mcmurray's  (-)   PATIENT SURVEYS:  KOOS, JR. Score: 14 / 28, Interval Score: 52.465 / 100   TODAY'S TREATMENT: Creating, reviewing, and completing below HEP   PATIENT EDUCATION:  POC, diagnosis, prognosis, HEP, and outcome measures.  Pt educated via explanation, demonstration, and handout (HEP).  Pt confirms understanding verbally.   HOME EXERCISE PROGRAM: Access Code: [email or web address] URL: [email or web address] Date: 06/05/2021  Prepared by: Shearon Balo  Exercises - Supine Quad Set  - 3 x daily - 7 x weekly - 2 sets - 10 reps - 10 hold - Active Straight Leg Raise with Quad Set  - 1 x daily - 7 x weekly - 3 sets - 10 reps - Supine Bridge  - 1 x daily - 7 x weekly - 3 sets - 10 reps  ASTERISK SIGNS   Asterisk Signs Eval (06/05/2021)       Knee ext mmt 3+       balance Tandem with R in rear <3''                                 ASSESSMENT:  CLINICAL IMPRESSION: Zoe is a 51 y.o. male who presents to clinic with signs and sxs consistent with chronic R knee pain and weakness.  Frey has a complicated medical history significant for severe COPD + emphysema he has had multiple lung surgeries over the last 2 years which left him essentially bed bound.  This has contributed to gross weakness in his knees and hip.  With no sign of meniscus or ligamentous pathology on MRI or physical exam, progressive strengthening will likely significantly improve his complaint.  OBJECTIVE IMPAIRMENTS: Pain, knee/LE/hip strength, balance  ACTIVITY LIMITATIONS: bending, squatting, lifting  PERSONAL FACTORS: See medical history and pertinent history   REHAB POTENTIAL: Good  CLINICAL DECISION MAKING: Stable/uncomplicated  EVALUATION COMPLEXITY: Low   GOALS:   SHORT TERM GOALS: Target date: 06/26/2021  Tavaras will be >75% HEP compliant to improve carryover between sessions and facilitate independent management of condition  Evaluation (06/05/2021): ongoing Goal status: INITIAL   LONG TERM  GOALS: Target date: 07/31/2021  Rajan will show a >/= 20 pt improvement in his KOOS Jr. score (MCID is 15 pts) as a proxy for functional improvement   Evaluation/Baseline (06/05/2021): KOOS, JR. Score: 14 / 28, Interval Score: 52.465 / 100 pts Goal status: INITIAL   2.  Jaeveon will be able to stand for >30'' in tandem stance, to show a significant improvement in balance in order to reduce fall risk   Evaluation/Baseline (06/05/2021): < 3'' R in rear Goal status: INITIAL   3.  Zylan will report confidence in self management of condition at time of discharge with advanced HEP  Evaluation/Baseline (06/05/2021): unable to self manage Goal status: INITIAL   4.  Kinnith will improve the following MMTs to >/= 4/5 to show improvement in strength:  R knee ext   Evaluation/Baseline (06/05/2021): see chart in note Goal status: INITIAL   PLAN: PT FREQUENCY: 1-2x/week  PT DURATION: 8 weeks (Ending 07/31/2021)  PLANNED INTERVENTIONS: Therapeutic exercises, Aquatic therapy, Therapeutic activity, Neuro Muscular re-education, Gait training, Patient/Family education, Joint mobilization, Dry Needling, Electrical stimulation, Spinal mobilization and/or manipulation, Moist heat, Taping, Vasopneumatic device, Ionotophoresis 4mg /ml Dexamethasone, and Manual therapy  PLAN FOR NEXT SESSION: progressive knee/hip/LE strengthening (with emphysema + COPD and inguinal hernia in mind), progressive balance    Shearon Balo PT, DPT 06/05/2021, 12:00 PM

## 2021-06-08 ENCOUNTER — Encounter: Payer: Self-pay | Admitting: Critical Care Medicine

## 2021-06-13 ENCOUNTER — Ambulatory Visit: Payer: Medicaid Other

## 2021-06-13 NOTE — Therapy (Incomplete)
OUTPATIENT PHYSICAL THERAPY TREATMENT NOTE   Patient Name: Richard Fuentes MRN: [id number] DOB:[birth year], 51 y.o., male Today's Date: 06/13/2021  PCP: Elsie Stain, MD REFERRING PROVIDER: Elsie Stain, MD  END OF SESSION:    Past Medical History:  Diagnosis Date   Anxiety    Asthma    COPD (chronic obstructive pulmonary disease) ([place])    Severe centrilobular and paraseptal bullous emphysema; Gold stage D; FEV1 and DLCO<30%.;  Currently undergoing transplant evaluation   Dyspnea    Environmental allergies    Esophagitis    Family history of adverse reaction to anesthesia    Paternal aunt had trouble waking   GERD (gastroesophageal reflux disease)    Headache    Hx of migraines    OCD (obsessive compulsive disorder)    OCD (obsessive compulsive disorder)    Pneumonia    Pneumothorax on left 03/20/2020   Psoriasis    Sleep apnea    Urticaria    Past Surgical History:  Procedure Laterality Date   Endobronchial valve Left 03/20/2020   LUL at [facility] Right 2002   arthroscopy and cartilidge removed   KNEE SURGERY Left 1987   Ligaments removed and screw in place   Patient Active Problem List   Diagnosis Date Noted   Allergic contact dermatitis due to metals 01/07/2020   OSA (obstructive sleep apnea) 09/13/2019   Hyperlipidemia with target LDL less than 100 08/20/2019   Family history of premature coronary artery disease 08/20/2019   Anxiety 08/01/2019   Perennial and seasonal allergic rhinitis 07/24/2019   Recurrent urticaria 07/24/2019   History of penicillin allergy 07/24/2019   History of food allergy 07/24/2019   Plaque psoriasis 06/05/2019   Chronic periodontal disease 03/21/2019   COPD with emphysema (HCC)GOLD D  12/07/2017   Inguinal hernia with obstruction without gangrene 02/04/2017   Generalized anxiety disorder 12/07/2011   History of tobacco use 12/07/2011    REFERRING DIAG: Referral diagnosis: Primary osteoarthritis of right  knee [M17.11]  THERAPY DIAG:  No diagnosis found.  Rationale for Evaluation and Treatment Rehabilitation  PERTINENT HISTORY: Severe COPD (watch O2 sat), Emphysema, Dyspnea, OCD, inguinal hernia, prior R knee surgery with "cartilage removal"  PRECAUTIONS: Severe COPD (watch O2 sat), Emphysema, Dyspnea, OCD, inguinal hernia  SUBJECTIVE: ***  Pain:  Are you having pain? No Pain location: Lateral diffuse knee pain NPRS scale:  current 0/10  average 3/10  Aggravating factors: squatting, crossing leg, walking           NPRS, highest: 3/10 Relieving factors: "popping knee back into place" rest           NPRS: best: 0/10 Pain description: intermittent Stage: Chronic Stability: staying the same 24 hour pattern: NA    OBJECTIVE: (objective measures completed at initial evaluation unless otherwise dated)    DIAGNOSTIC FINDINGS:  IMPRESSION: 1.  No evidence of meniscal tear.   2.  Cruciate and collateral ligaments are intact.   3. Tricompartmental degenerative changes of the articular cartilage without evidence of full-thickness defect.   4.  Small joint effusion with synovitis.   5.  Small Baker's cyst.   6.  No evidence of fracture or dislocation.             GENERAL OBSERVATION/GAIT:                     R hip drop in gait, toe out with some resulting valgus collapse of bil knees   SENSATION:  Light touch: Appears intact   PALPATION: No TTP   MUSCLE LENGTH: Hamstrings: Right significant restriction; Left significant restriction ASLR: Right ASLR = PSLR; Left ASLR = PSLR Ely's test: Right moderate restriction; Left moderate restriction   LE MMT:   MMT Right 06/05/2021 Left 06/05/2021  Hip flexion (L2, L3)      Knee extension (L3) 3+ 3+  Knee flexion 4 4  Hip abduction 3+ 3+  Hip extension 3 3+  Hip external rotation      Hip internal rotation      Hip adduction      Ankle dorsiflexion (L4)      Ankle plantarflexion (S1)      Ankle inversion       Ankle eversion      Great Toe ext (L5)      Grossly        (Blank rows = not tested, score listed is out of 5 possible points.  N = WNL, D = diminished, C = clear for gross weakness with myotome testing, * = concordant pain with testing)   LE ROM:   ROM Right 06/05/2021 Left 06/05/2021  Hip flexion      Hip extension      Hip abduction      Hip adduction      Hip internal rotation limited limited  Hip external rotation N N  Knee flexion N N  Knee extension N N  Ankle dorsiflexion      Ankle plantarflexion      Ankle inversion      Ankle eversion        (Blank rows = not tested, N = WNL, * = concordant pain with testing)   Functional Tests   Eval (06/05/2021)      Progressive balance screen (highest level completed for >/= 10''):   Tandem: R in rear 3'', L in rear 10'' SLS: R unable, L unable                                                                                                      LOWER EXTREMITY SPECIAL TESTS:  Knee special tests: ligamentous testing and mcmurray's (-)     PATIENT SURVEYS:  KOOS, JR. Score: 14 / 28, Interval Score: 52.465 / 100     TODAY'S TREATMENT: [facility] Adult PT Treatment:                                                DATE: 06/13/2021 Therapeutic Exercise: *** Manual Therapy: *** Neuromuscular re-ed: *** Therapeutic Activity: *** Modalities: *** Self Care: ***      PATIENT EDUCATION:  POC, diagnosis, prognosis, HEP, and outcome measures.  Pt educated via explanation, demonstration, and handout (HEP).  Pt confirms understanding verbally.    HOME EXERCISE PROGRAM: Access Code: [email or web address] URL: [email or web address] Date: 06/05/2021 Prepared by: Shearon Balo   Exercises - Supine Quad Set  - 3 x daily - 7 x weekly -  2 sets - 10 reps - 10 hold - Active Straight Leg Raise with Quad Set  - 1 x daily - 7 x weekly - 3 sets - 10 reps - Supine Bridge  - 1 x daily - 7 x weekly - 3 sets - 10 reps    ASTERISK SIGNS     Asterisk Signs Eval (06/05/2021)            Knee ext mmt 3+            balance Tandem with R in rear <3''                                                             ASSESSMENT:   CLINICAL IMPRESSION: ***   OBJECTIVE IMPAIRMENTS: Pain, knee/LE/hip strength, balance   ACTIVITY LIMITATIONS: bending, squatting, lifting   PERSONAL FACTORS: See medical history and pertinent history         GOALS:     SHORT TERM GOALS: Target date: 06/26/2021   Richard Fuentes will be >75% HEP compliant to improve carryover between sessions and facilitate independent management of condition   Evaluation (06/05/2021): ongoing Goal status: INITIAL     LONG TERM GOALS: Target date: 07/31/2021   Richard Fuentes will show a >/= 20 pt improvement in his KOOS Jr. score (MCID is 15 pts) as a proxy for functional improvement    Evaluation/Baseline (06/05/2021): KOOS, JR. Score: 14 / 28, Interval Score: 52.465 / 100 pts Goal status: INITIAL     2.  Richard Fuentes will be able to stand for >30'' in tandem stance, to show a significant improvement in balance in order to reduce fall risk    Evaluation/Baseline (06/05/2021): < 3'' R in rear Goal status: INITIAL     3.  Richard Fuentes will report confidence in self management of condition at time of discharge with advanced HEP   Evaluation/Baseline (06/05/2021): unable to self manage Goal status: INITIAL     4.  Richard Fuentes will improve the following MMTs to >/= 4/5 to show improvement in strength:  R knee ext    Evaluation/Baseline (06/05/2021): see chart in note Goal status: INITIAL     PLAN: PT FREQUENCY: 1-2x/week   PT DURATION: 8 weeks (Ending 07/31/2021)   PLANNED INTERVENTIONS: Therapeutic exercises, Aquatic therapy, Therapeutic activity, Neuro Muscular re-education, Gait training, Patient/Family education, Joint mobilization, Dry Needling, Electrical stimulation, Spinal mobilization and/or manipulation, Moist heat, Taping, Vasopneumatic device, Ionotophoresis 4mg /ml  Dexamethasone, and Manual therapy   PLAN FOR NEXT SESSION: progressive knee/hip/LE strengthening (with emphysema + COPD and inguinal hernia in mind), progressive balance     Vanessa Ridgely, PT, DPT 06/13/21 8:03 AM

## 2021-06-15 DIAGNOSIS — F331 Major depressive disorder, recurrent, moderate: Secondary | ICD-10-CM | POA: Diagnosis not present

## 2021-06-15 DIAGNOSIS — F411 Generalized anxiety disorder: Secondary | ICD-10-CM | POA: Diagnosis not present

## 2021-06-15 DIAGNOSIS — F429 Obsessive-compulsive disorder, unspecified: Secondary | ICD-10-CM | POA: Diagnosis not present

## 2021-06-17 ENCOUNTER — Encounter: Payer: Self-pay | Admitting: Critical Care Medicine

## 2021-06-17 ENCOUNTER — Ambulatory Visit: Payer: Medicaid Other | Attending: Critical Care Medicine | Admitting: Critical Care Medicine

## 2021-06-17 ENCOUNTER — Other Ambulatory Visit (HOSPITAL_BASED_OUTPATIENT_CLINIC_OR_DEPARTMENT_OTHER): Payer: Self-pay

## 2021-06-17 VITALS — BP 115/79 | HR 108 | Wt 191.4 lb

## 2021-06-17 DIAGNOSIS — L400 Psoriasis vulgaris: Secondary | ICD-10-CM

## 2021-06-17 DIAGNOSIS — J431 Panlobular emphysema: Secondary | ICD-10-CM

## 2021-06-17 DIAGNOSIS — J029 Acute pharyngitis, unspecified: Secondary | ICD-10-CM | POA: Insufficient documentation

## 2021-06-17 MED ORDER — LIDOCAINE VISCOUS HCL 2 % MT SOLN
15.0000 mL | Freq: Four times a day (QID) | OROMUCOSAL | 0 refills | Status: DC | PRN
Start: 2021-06-17 — End: 2022-05-13
  Filled 2021-06-17 – 2021-08-24 (×3): qty 100, 2d supply, fill #0

## 2021-06-17 MED ORDER — CEFDINIR 300 MG PO CAPS
300.0000 mg | ORAL_CAPSULE | Freq: Two times a day (BID) | ORAL | 0 refills | Status: AC
  Filled 2021-06-17: qty 20, 10d supply, fill #0

## 2021-06-17 NOTE — Assessment & Plan Note (Signed)
Continue inhaled medicines 

## 2021-06-17 NOTE — Patient Instructions (Signed)
Take cefdinir twice daily for 10 days  Use lidocaine viscous gargle and expectorate 4 times daily as needed for throat pain  Strep throat culture was obtained and COVID testing was obtained  Virtual visit with Dr. Joya Gaskins to be scheduled next week

## 2021-06-17 NOTE — Progress Notes (Signed)
Acute Office Visit  Subjective:     Patient ID: Richard Fuentes, male    DOB: 26-Nov-1970, 51 y.o.   MRN: [id number]  Chief Complaint  Patient presents with   Sore Throat   Fever   Headache   Cough   Chills    Patient seen as an acute work in he has had 4 days worth of sore throat fever headache cough and chills this started on Saturday the past weekend and is worsening.  He had was had a gambling venture in [place] last week was around of others who might of been ill.  He complains of severe sore throat and swelling in the right precervical lymph nodes which are tender and painful.  He is running low-grade fever as well.  His pulmonary status is unchanged.  He is fully vaccinated for COVID.   Past Medical History:  Diagnosis Date   Anxiety    Asthma    COPD (chronic obstructive pulmonary disease) (HCC)    Severe centrilobular and paraseptal bullous emphysema; Gold stage D; FEV1 and DLCO<30%.;  Currently undergoing transplant evaluation   Dyspnea    Environmental allergies    Esophagitis    Family history of adverse reaction to anesthesia    Paternal aunt had trouble waking   GERD (gastroesophageal reflux disease)    Headache    Hx of migraines    OCD (obsessive compulsive disorder)    OCD (obsessive compulsive disorder)    Pneumonia    Pneumothorax on left 03/20/2020   Psoriasis    Sleep apnea    Urticaria      Family History  Problem Relation Age of Onset   Multiple sclerosis Sister    Psoriasis Maternal Grandmother    Heart attack Mother        Late in life   Heart attack Father 80   CAD Father    Heart attack Paternal Grandfather 12       Sudden cardiac death   Sudden Cardiac Death Paternal Grandfather 62   Heart attack Maternal Uncle    CAD Maternal Uncle    Heart attack Cousin 41   Sudden Cardiac Death Cousin 24   Allergic rhinitis Neg Hx    Angioedema Neg Hx    Asthma Neg Hx    Eczema Neg Hx    Immunodeficiency Neg Hx    Urticaria Neg Hx       Social History   Socioeconomic History   Marital status: Significant Other    Spouse name: Not on file   Number of children: Not on file   Years of education: Not on file   Highest education level: Not on file  Occupational History   Not on file  Tobacco Use   Smoking status: Former    Packs/day: 1.00    Years: 30.00    Total pack years: 30.00    Types: Cigarettes    Quit date: 01/19/2018    Years since quitting: 3.4   Smokeless tobacco: Never  Vaping Use   Vaping Use: Never used  Substance and Sexual Activity   Alcohol use: No   Drug use: No   Sexual activity: Not Currently  Other Topics Concern   Not on file  Social History Narrative   Not on file   Social Determinants of Health   Financial Resource Strain: Not on file  Food Insecurity: Not on file  Transportation Needs: Not on file  Physical Activity: Not on file  Stress: Not on  file  Social Connections: Not on file  Intimate Partner Violence: Not on file     Allergies  Allergen Reactions   Other Anaphylaxis    Wool   Shellfish Allergy     Unknown reaction     Outpatient Medications Prior to Visit  Medication Sig Dispense Refill   albuterol (VENTOLIN HFA) 108 (90 Base) MCG/ACT inhaler Inhale 2 puffs into the lungs every 6 (six) hours as needed for wheezing or shortness of breath. 18 g 2   atorvastatin (LIPITOR) 20 MG tablet Take 1 tablet (20 mg total) by mouth daily. 90 tablet 3   benzonatate (TESSALON) 100 MG capsule Take 1 capsule (100 mg total) by mouth every 8 (eight) hours. 90 capsule 2   calcipotriene (DOVONOX) 0.005 % cream Apply topically 2 (two) times daily. 60 g 0   desonide (DESOWEN) 0.05 % cream Apply 1 application topically 2 (two) times daily as needed (psoriasis).     EPINEPHrine 0.3 mg/0.3 mL IJ SOAJ injection Inject 0.3 mg into the muscle as needed for anaphylaxis. 2 each 1   esomeprazole (NEXIUM) 40 MG capsule TAKE ONE CAPSULE BY MOUTH TWICE A DAY BEFORE A MEAL 60 capsule 2    Fluticasone-Umeclidin-Vilant (TRELEGY ELLIPTA) 200-62.5-25 MCG/ACT AEPB Inhale 1 puff into the lungs daily. 60 each 4   halobetasol (ULTRAVATE) 0.05 % cream Apply topically 2 (two) times daily. (Patient taking differently: Apply 1 application  topically 2 (two) times daily as needed (irritation).) 50 g 0   hydrocortisone 2.5 % cream Apply 1 application topically 2 (two) times daily as needed (psoriasis).     ipratropium-albuterol (DUONEB) 0.5-2.5 (3) MG/3ML SOLN Inhale 3 mLs into the lungs every 6 (six) hours as needed (shortness of breath). 360 mL 0   Multiple Vitamin (MULTIVITAMIN WITH MINERALS) TABS tablet Take 1 tablet by mouth daily.     OXYGEN Inhale 2 L into the lungs continuous.     PARoxetine (PAXIL) 10 MG tablet Take one tablet by mouth in the morning. 30 tablet 0   traZODone (DESYREL) 50 MG tablet Take 0.5-1 tablets (25-50 mg total) by mouth at bedtime as needed for sleep. 30 tablet 3   SKYRIZI PEN 150 MG/ML SOAJ Inject into the skin.     No facility-administered medications prior to visit.      Review of Systems  Constitutional:  Positive for chills, fever and malaise/fatigue. Negative for diaphoresis and weight loss.  HENT:  Positive for congestion and sore throat. Negative for ear discharge, ear pain, hearing loss, nosebleeds and tinnitus.        Right-sided throat pain and lymphadenopathy  Eyes:  Negative for blurred vision, photophobia and redness.  Respiratory:  Negative for cough, hemoptysis, sputum production, shortness of breath, wheezing and stridor.   Cardiovascular:  Negative for chest pain, palpitations, orthopnea, claudication, leg swelling and PND.  Gastrointestinal:  Negative for abdominal pain, blood in stool, constipation, diarrhea, heartburn, nausea and vomiting.  Genitourinary:  Negative for dysuria, flank pain, frequency, hematuria and urgency.  Musculoskeletal:  Positive for myalgias. Negative for back pain, falls, joint pain and neck pain.  Skin:  Negative  for itching and rash.  Neurological:  Negative for dizziness, tingling, tremors, sensory change, speech change, focal weakness, seizures, loss of consciousness, weakness and headaches.  Endo/Heme/Allergies:  Negative for environmental allergies and polydipsia. Does not bruise/bleed easily.  Psychiatric/Behavioral:  Negative for depression, memory loss, substance abuse and suicidal ideas. The patient is not nervous/anxious and does not have insomnia.  Objective:    BP 115/79   Pulse (!) 108   Wt 191 lb 6.4 oz (86.8 kg)   SpO2 98%   BMI 25.25 kg/m    Physical Exam Vitals reviewed.  Constitutional:      General: He is not in acute distress.    Appearance: Normal appearance. He is well-developed. He is ill-appearing. He is not toxic-appearing or diaphoretic.  HENT:     Head: Normocephalic and atraumatic.     Right Ear: Tympanic membrane and ear canal normal. No drainage, swelling or tenderness. No middle ear effusion. Tympanic membrane is not erythematous.     Left Ear: Tympanic membrane and ear canal normal. No drainage, swelling or tenderness.  No middle ear effusion. Tympanic membrane is not erythematous.     Nose: Congestion present. No nasal deformity, septal deviation, mucosal edema or rhinorrhea.     Right Sinus: No maxillary sinus tenderness or frontal sinus tenderness.     Left Sinus: No maxillary sinus tenderness or frontal sinus tenderness.     Mouth/Throat:     Mouth: Mucous membranes are moist. No oral lesions.     Pharynx: Uvula midline. Oropharyngeal exudate, posterior oropharyngeal erythema and uvula swelling present. No pharyngeal swelling.     Tonsils: Tonsillar exudate and tonsillar abscess present. 3+ on the right. 2+ on the left.  Eyes:     General: No scleral icterus.    Conjunctiva/sclera: Conjunctivae normal.     Pupils: Pupils are equal, round, and reactive to light.  Neck:     Thyroid: No thyromegaly.     Vascular: No carotid bruit or JVD.      Trachea: Trachea normal. No tracheal tenderness or tracheal deviation.  Cardiovascular:     Rate and Rhythm: Normal rate and regular rhythm.     Chest Wall: PMI is not displaced.     Pulses: Normal pulses. No decreased pulses.     Heart sounds: Normal heart sounds, S1 normal and S2 normal. Heart sounds not distant. No murmur heard.    No systolic murmur is present.     No diastolic murmur is present.     No friction rub. No gallop. No S3 or S4 sounds.  Pulmonary:     Effort: Pulmonary effort is normal. No tachypnea, accessory muscle usage or respiratory distress.     Breath sounds: No stridor. No decreased breath sounds, wheezing, rhonchi or rales.     Comments: Distant breath sounds Chest:     Chest wall: No tenderness.  Abdominal:     General: Bowel sounds are normal. There is no distension.     Palpations: Abdomen is soft. Abdomen is not rigid.     Tenderness: There is no abdominal tenderness. There is no guarding or rebound.  Musculoskeletal:        General: Normal range of motion.     Cervical back: Normal range of motion and neck supple. No edema, erythema or rigidity. No muscular tenderness. Normal range of motion.  Lymphadenopathy:     Head:     Right side of head: No submental or submandibular adenopathy.     Left side of head: No submental or submandibular adenopathy.     Cervical: No cervical adenopathy.  Skin:    General: Skin is warm and dry.     Coloration: Skin is not pale.     Findings: No rash.     Nails: There is no clubbing.  Neurological:     Mental Status: He is alert  and oriented to person, place, and time.     Sensory: No sensory deficit.  Psychiatric:        Speech: Speech normal.        Behavior: Behavior normal.     No results found for any visits on 06/17/21.      Assessment & Plan:   Problem List Items Addressed This Visit       Respiratory   COPD with emphysema (HCC)GOLD D  (Chronic)    Continue inhaled medicines      Pharyngitis -  Primary    Acute pharyngitis likely strep in nature we will test him for COVID to be sure  Begin cefdinir 300 mg twice daily for 10 days  Viscous Xylocaine  given as needed for throat pain      Relevant Orders   Novel Coronavirus, NAA ([facility])   Culture, Group A Strep     Musculoskeletal and Integument   Plaque psoriasis    Has a molecular biological prescription in hand I told him to hold off on this till he recovers from his current illness       Meds ordered this encounter  Medications   cefdinir (OMNICEF) 300 MG capsule    Sig: Take 1 capsule (300 mg total) by mouth 2 (two) times daily for 10 days.    Dispense:  20 capsule    Refill:  0   lidocaine (XYLOCAINE) 2 % solution    Sig: Use as directed 15 mLs in the mouth or throat every 6 (six) hours as needed for mouth pain. Gargle and expectorate    Dispense:  100 mL    Refill:  0    Return in about 1 week (around 06/24/2021) for Virtual visit with Dr. Joya Gaskins 1 week add on.  Asencion Noble, MD

## 2021-06-17 NOTE — Assessment & Plan Note (Signed)
Acute pharyngitis likely strep in nature we will test him for COVID to be sure  Begin cefdinir 300 mg twice daily for 10 days  Viscous Xylocaine  given as needed for throat pain

## 2021-06-17 NOTE — Assessment & Plan Note (Signed)
Has a molecular biological prescription in hand I told him to hold off on this till he recovers from his current illness

## 2021-06-18 ENCOUNTER — Ambulatory Visit: Payer: Medicaid Other | Admitting: Physical Therapy

## 2021-06-19 ENCOUNTER — Telehealth: Payer: Self-pay

## 2021-06-19 LAB — NOVEL CORONAVIRUS, NAA: SARS-CoV-2, NAA: NOT DETECTED

## 2021-06-19 LAB — CULTURE, GROUP A STREP: Strep A Culture: NEGATIVE

## 2021-06-19 NOTE — Telephone Encounter (Signed)
Pt was called and is aware of results, DOB was confirmed.  ?

## 2021-06-19 NOTE — Progress Notes (Signed)
Let pt know covid is Negative

## 2021-06-19 NOTE — Telephone Encounter (Signed)
-----   Message from Storm Frisk, MD sent at 06/19/2021  5:59 AM EDT ----- Let pt know covid is Negative

## 2021-06-24 ENCOUNTER — Encounter: Payer: Self-pay | Admitting: Critical Care Medicine

## 2021-06-24 ENCOUNTER — Ambulatory Visit: Payer: Medicaid Other

## 2021-06-24 DIAGNOSIS — M25561 Pain in right knee: Secondary | ICD-10-CM | POA: Diagnosis not present

## 2021-06-24 DIAGNOSIS — R2681 Unsteadiness on feet: Secondary | ICD-10-CM | POA: Diagnosis not present

## 2021-06-24 DIAGNOSIS — M1711 Unilateral primary osteoarthritis, right knee: Secondary | ICD-10-CM | POA: Diagnosis not present

## 2021-06-24 DIAGNOSIS — G8929 Other chronic pain: Secondary | ICD-10-CM | POA: Diagnosis not present

## 2021-06-24 NOTE — Therapy (Signed)
OUTPATIENT PHYSICAL THERAPY TREATMENT NOTE   Patient Name: Richard Fuentes MRN: [id number] DOB:1970/02/02, 51 y.o., male Today's Date: 06/24/2021  PCP: Storm Frisk, MD REFERRING PROVIDER: Storm Frisk, MD  END OF SESSION:   PT End of Session - 06/24/21 1010     Visit Number 2    Date for PT Re-Evaluation 07/31/21    Authorization Type [employer]    PT Start Time 1010   Pt arrived 10 mins late to appt   PT Stop Time 1045    PT Time Calculation (min) 35 min    Activity Tolerance Patient tolerated treatment well    Behavior During Therapy WFL for tasks assessed/performed             Past Medical History:  Diagnosis Date   Anxiety    Asthma    COPD (chronic obstructive pulmonary disease) (HCC)    Severe centrilobular and paraseptal bullous emphysema; Gold stage D; FEV1 and DLCO<30%.;  Currently undergoing transplant evaluation   Dyspnea    Environmental allergies    Esophagitis    Family history of adverse reaction to anesthesia    Paternal aunt had trouble waking   GERD (gastroesophageal reflux disease)    Headache    Hx of migraines    OCD (obsessive compulsive disorder)    OCD (obsessive compulsive disorder)    Pneumonia    Pneumothorax on left 03/20/2020   Psoriasis    Sleep apnea    Urticaria    Past Surgical History:  Procedure Laterality Date   Endobronchial valve Left 03/20/2020   LUL at [facility]   KNEE SURGERY Right 2002   arthroscopy and cartilidge removed   KNEE SURGERY Left 1987   Ligaments removed and screw in place   Patient Active Problem List   Diagnosis Date Noted   Pharyngitis 06/17/2021   Allergic contact dermatitis due to metals 01/07/2020   OSA (obstructive sleep apnea) 09/13/2019   Hyperlipidemia with target LDL less than 100 08/20/2019   Family history of premature coronary artery disease 08/20/2019   Anxiety 08/01/2019   Perennial and seasonal allergic rhinitis 07/24/2019   Recurrent urticaria 07/24/2019   History of  penicillin allergy 07/24/2019   History of food allergy 07/24/2019   Plaque psoriasis 06/05/2019   Chronic periodontal disease 03/21/2019   COPD with emphysema (HCC)GOLD D  12/07/2017   Inguinal hernia with obstruction without gangrene 02/04/2017   Generalized anxiety disorder 12/07/2011   History of tobacco use 12/07/2011    REFERRING DIAG: Referral diagnosis: Primary osteoarthritis of right knee [M17.11]  THERAPY DIAG:  Chronic pain of right knee  Unsteadiness on feet  Rationale for Evaluation and Treatment Rehabilitation  PERTINENT HISTORY: Severe COPD (watch O2 sat), Emphysema, Dyspnea, OCD, inguinal hernia, prior R knee surgery with "cartilage removal"  PRECAUTIONS: Severe COPD (watch O2 sat), Emphysema, Dyspnea, OCD, inguinal hernia  SUBJECTIVE: Patient reports that his knee "popped out of place" last night.  PAIN:  Are you having pain? No Pain location: Lateral diffuse knee pain NPRS scale:  current 0/10  average 3/10  Aggravating factors: squatting, crossing leg, walking           NPRS, highest: 3/10 Relieving factors: "popping knee back into place" rest           NPRS: best: 0/10 Pain description: intermittent Stage: Chronic Stability: staying the same 24 hour pattern: NA    OBJECTIVE: (objective measures completed at initial evaluation unless otherwise dated)   DIAGNOSTIC FINDINGS:  IMPRESSION: 1.  No evidence of meniscal tear.   2.  Cruciate and collateral ligaments are intact.   3. Tricompartmental degenerative changes of the articular cartilage without evidence of full-thickness defect.   4.  Small joint effusion with synovitis.   5.  Small Baker's cyst.   6.  No evidence of fracture or dislocation.             GENERAL OBSERVATION/GAIT:                     R hip drop in gait, toe out with some resulting valgus collapse of bil knees   SENSATION:          Light touch: Appears intact   PALPATION: No TTP   MUSCLE LENGTH: Hamstrings: Right  significant restriction; Left significant restriction ASLR: Right ASLR = PSLR; Left ASLR = PSLR Ely's test: Right moderate restriction; Left moderate restriction   LE MMT:   MMT Right 06/05/2021 Left 06/05/2021  Hip flexion (L2, L3)      Knee extension (L3) 3+ 3+  Knee flexion 4 4  Hip abduction 3+ 3+  Hip extension 3 3+  Hip external rotation      Hip internal rotation      Hip adduction      Ankle dorsiflexion (L4)      Ankle plantarflexion (S1)      Ankle inversion      Ankle eversion      Great Toe ext (L5)      Grossly        (Blank rows = not tested, score listed is out of 5 possible points.  N = WNL, D = diminished, C = clear for gross weakness with myotome testing, * = concordant pain with testing)   LE ROM:   ROM Right 06/05/2021 Left 06/05/2021  Hip flexion      Hip extension      Hip abduction      Hip adduction      Hip internal rotation limited limited  Hip external rotation N N  Knee flexion N N  Knee extension N N  Ankle dorsiflexion      Ankle plantarflexion      Ankle inversion      Ankle eversion        (Blank rows = not tested, N = WNL, * = concordant pain with testing)   Functional Tests   Eval (06/05/2021)      Progressive balance screen (highest level completed for >/= 10''):   Tandem: R in rear 3'', L in rear 10'' SLS: R unable, L unable                                                                                                      LOWER EXTREMITY SPECIAL TESTS:  Knee special tests: ligamentous testing and mcmurray's (-)     PATIENT SURVEYS:  KOOS, JR. Score: 14 / 28, Interval Score: 52.465 / 100     TODAY'S TREATMENT: [facility] Adult PT Treatment:  DATE: 06/24/2021 Pre-session O2 sats 98% HR 111 BPM Therapeutic Exercise: LAQ 2x10 Seated hamstring curl GTB 2x10 BIL Seated marching - GTB 2x10 Seated clamshells 2x10 Seated heel/toe raises 2x10 SLR 2x10 BIL Bridge 2x10 Supine  hamstring stretch 2x30" BIL Neuromuscular re-ed: Tandem stance BIL 2x30"   06/05/221: Creating, reviewing, and completing below HEP     PATIENT EDUCATION:  POC, diagnosis, prognosis, HEP, and outcome measures.  Pt educated via explanation, demonstration, and handout (HEP).  Pt confirms understanding verbally.    HOME EXERCISE PROGRAM: Access Code: [email or web address] URL: [email or web address] Date: 06/05/2021 Prepared by: Shearon Balo   Exercises - Supine Quad Set  - 3 x daily - 7 x weekly - 2 sets - 10 reps - 10 hold - Active Straight Leg Raise with Quad Set  - 1 x daily - 7 x weekly - 3 sets - 10 reps - Supine Bridge  - 1 x daily - 7 x weekly - 3 sets - 10 reps   ASTERISK SIGNS     Asterisk Signs Eval (06/05/2021)            Knee ext mmt 3+            balance Tandem with R in rear <3''                                                             ASSESSMENT:   CLINICAL IMPRESSION: Patient presents to PT with no current pain and shortness of breath. Session today focused on strengthening LE while not exacerbating SOB. He was able to tolerate exercises with rest breaks and reminders to not over extend. Patient continues to benefit from skilled PT services and should be progressed as able to improve functional independence.     OBJECTIVE IMPAIRMENTS: Pain, knee/LE/hip strength, balance   ACTIVITY LIMITATIONS: bending, squatting, lifting   PERSONAL FACTORS: See medical history and pertinent history     REHAB POTENTIAL: Good   CLINICAL DECISION MAKING: Stable/uncomplicated   EVALUATION COMPLEXITY: Low     GOALS:     SHORT TERM GOALS: Target date: 06/26/2021   Raoul will be >75% HEP compliant to improve carryover between sessions and facilitate independent management of condition   Evaluation (06/05/2021): ongoing Goal status: INITIAL     LONG TERM GOALS: Target date: 07/31/2021   Jakeem will show a >/= 20 pt improvement in his KOOS Jr. score (MCID is 15 pts)  as a proxy for functional improvement    Evaluation/Baseline (06/05/2021): KOOS, JR. Score: 14 / 28, Interval Score: 52.465 / 100 pts Goal status: INITIAL     2.  Amer will be able to stand for >30'' in tandem stance, to show a significant improvement in balance in order to reduce fall risk    Evaluation/Baseline (06/05/2021): < 3'' R in rear Goal status: INITIAL     3.  Giulio will report confidence in self management of condition at time of discharge with advanced HEP   Evaluation/Baseline (06/05/2021): unable to self manage Goal status: INITIAL     4.  Ara will improve the following MMTs to >/= 4/5 to show improvement in strength:  R knee ext    Evaluation/Baseline (06/05/2021): see chart in note Goal status: INITIAL     PLAN: PT FREQUENCY: 1-2x/week   PT DURATION:  8 weeks (Ending 07/31/2021)   PLANNED INTERVENTIONS: Therapeutic exercises, Aquatic therapy, Therapeutic activity, Neuro Muscular re-education, Gait training, Patient/Family education, Joint mobilization, Dry Needling, Electrical stimulation, Spinal mobilization and/or manipulation, Moist heat, Taping, Vasopneumatic device, Ionotophoresis 4mg /ml Dexamethasone, and Manual therapy   PLAN FOR NEXT SESSION: progressive knee/hip/LE strengthening (with emphysema + COPD and inguinal hernia in mind), progressive balance     , PTA 06/24/2021, 10:43 AM

## 2021-06-25 ENCOUNTER — Other Ambulatory Visit (HOSPITAL_BASED_OUTPATIENT_CLINIC_OR_DEPARTMENT_OTHER): Payer: Self-pay

## 2021-06-25 ENCOUNTER — Telehealth: Payer: Self-pay | Admitting: Critical Care Medicine

## 2021-06-25 DIAGNOSIS — Z125 Encounter for screening for malignant neoplasm of prostate: Secondary | ICD-10-CM | POA: Diagnosis not present

## 2021-06-25 DIAGNOSIS — R3912 Poor urinary stream: Secondary | ICD-10-CM | POA: Diagnosis not present

## 2021-06-25 DIAGNOSIS — R351 Nocturia: Secondary | ICD-10-CM | POA: Diagnosis not present

## 2021-06-25 DIAGNOSIS — R350 Frequency of micturition: Secondary | ICD-10-CM | POA: Diagnosis not present

## 2021-06-25 DIAGNOSIS — N401 Enlarged prostate with lower urinary tract symptoms: Secondary | ICD-10-CM | POA: Diagnosis not present

## 2021-06-25 MED ORDER — TAMSULOSIN HCL 0.4 MG PO CAPS
ORAL_CAPSULE | ORAL | 3 refills | Status: DC
  Filled 2021-06-25: qty 30, 30d supply, fill #0

## 2021-06-25 NOTE — Telephone Encounter (Signed)
Copied from CRM [id number]. Topic: Appointment Scheduling - Scheduling Inquiry for Clinic >> Jun 24, 2021 10:01 AM Richard Fuentes wrote: Reason for CRM: Pt stated he just saw Dr Delford Field last week and was advised he was supposed to have a follow up MyChart video visit with Dr Delford Field Today 6/21, nothing reflects on pts chart, no open appts,  please advise.  - Does this patient need to be scheduled?

## 2021-06-29 ENCOUNTER — Other Ambulatory Visit (HOSPITAL_BASED_OUTPATIENT_CLINIC_OR_DEPARTMENT_OTHER): Payer: Self-pay

## 2021-07-01 ENCOUNTER — Ambulatory Visit: Payer: Medicaid Other

## 2021-07-01 DIAGNOSIS — G8929 Other chronic pain: Secondary | ICD-10-CM

## 2021-07-01 DIAGNOSIS — M25561 Pain in right knee: Secondary | ICD-10-CM | POA: Diagnosis not present

## 2021-07-01 DIAGNOSIS — M1711 Unilateral primary osteoarthritis, right knee: Secondary | ICD-10-CM | POA: Diagnosis not present

## 2021-07-01 DIAGNOSIS — R2681 Unsteadiness on feet: Secondary | ICD-10-CM

## 2021-07-01 NOTE — Therapy (Signed)
OUTPATIENT PHYSICAL THERAPY TREATMENT NOTE   Patient Name: Richard Fuentes MRN: [id number] DOB:[birth year], 51 y.o., male Today's Date: 07/01/2021  PCP: Elsie Stain, MD REFERRING PROVIDER: Elsie Stain, MD  END OF SESSION:   PT End of Session - 07/01/21 1621     Visit Number 3    Date for PT Re-Evaluation 07/31/21    Authorization Type [employer]    Authorization Time Period 06/06/2021-07/05/2021    Authorization - Visit Number 2    Authorization - Number of Visits 4    PT Start Time [id number]    PT Stop Time 1700    PT Time Calculation (min) 39 min    Activity Tolerance Patient tolerated treatment well    Behavior During Therapy WFL for tasks assessed/performed              Past Medical History:  Diagnosis Date   Anxiety    Asthma    COPD (chronic obstructive pulmonary disease) ([place])    Severe centrilobular and paraseptal bullous emphysema; Gold stage D; FEV1 and DLCO<30%.;  Currently undergoing transplant evaluation   Dyspnea    Environmental allergies    Esophagitis    Family history of adverse reaction to anesthesia    Paternal aunt had trouble waking   GERD (gastroesophageal reflux disease)    Headache    Hx of migraines    OCD (obsessive compulsive disorder)    OCD (obsessive compulsive disorder)    Pneumonia    Pneumothorax on left 03/20/2020   Psoriasis    Sleep apnea    Urticaria    Past Surgical History:  Procedure Laterality Date   Endobronchial valve Left 03/20/2020   LUL at [place] Right 2002   arthroscopy and cartilidge removed   KNEE SURGERY Left 1987   Ligaments removed and screw in place   Patient Active Problem List   Diagnosis Date Noted   Pharyngitis 06/17/2021   Allergic contact dermatitis due to metals 01/07/2020   OSA (obstructive sleep apnea) 09/13/2019   Hyperlipidemia with target LDL less than 100 08/20/2019   Family history of premature coronary artery disease 08/20/2019   Anxiety 08/01/2019   Perennial and  seasonal allergic rhinitis 07/24/2019   Recurrent urticaria 07/24/2019   History of penicillin allergy 07/24/2019   History of food allergy 07/24/2019   Plaque psoriasis 06/05/2019   Chronic periodontal disease 03/21/2019   COPD with emphysema (HCC)GOLD D  12/07/2017   Inguinal hernia with obstruction without gangrene 02/04/2017   Generalized anxiety disorder 12/07/2011   History of tobacco use 12/07/2011    REFERRING DIAG: Referral diagnosis: Primary osteoarthritis of right knee [M17.11]  THERAPY DIAG:  Chronic pain of right knee  Unsteadiness on feet  Rationale for Evaluation and Treatment Rehabilitation  PERTINENT HISTORY: Severe COPD (watch O2 sat), Emphysema, Dyspnea, OCD, inguinal hernia, prior R knee surgery with "cartilage removal"  PRECAUTIONS: Severe COPD (watch O2 sat), Emphysema, Dyspnea, OCD, inguinal hernia  SUBJECTIVE: Pt reports that his knee is feeling "okay" today, reporting no pain. He reports doing his HEP about every other day. He reports that his Rt knee can sometimes get "stuck" when sitting with his legs crossed, requiring him to physically move it into extension with his hands.  PAIN:  Are you having pain? No Pain location: Lateral diffuse knee pain NPRS scale:  current 0/10  average 3/10  Aggravating factors: squatting, crossing leg, walking           NPRS, highest: 3/10  Relieving factors: "popping knee back into place" rest           NPRS: best: 0/10 Pain description: intermittent Stage: Chronic Stability: staying the same 24 hour pattern: NA    OBJECTIVE: (objective measures completed at initial evaluation unless otherwise dated)   DIAGNOSTIC FINDINGS:  IMPRESSION: 1.  No evidence of meniscal tear.   2.  Cruciate and collateral ligaments are intact.   3. Tricompartmental degenerative changes of the articular cartilage without evidence of full-thickness defect.   4.  Small joint effusion with synovitis.   5.  Small Baker's cyst.    6.  No evidence of fracture or dislocation.             GENERAL OBSERVATION/GAIT:                     R hip drop in gait, toe out with some resulting valgus collapse of bil knees   SENSATION:          Light touch: Appears intact   PALPATION: No TTP   MUSCLE LENGTH: Hamstrings: Right significant restriction; Left significant restriction ASLR: Right ASLR = PSLR; Left ASLR = PSLR Ely's test: Right moderate restriction; Left moderate restriction   LE MMT:   MMT Right 06/05/2021 Left 06/05/2021  Hip flexion (L2, L3)      Knee extension (L3) 3+ 3+  Knee flexion 4 4  Hip abduction 3+ 3+  Hip extension 3 3+  Hip external rotation      Hip internal rotation      Hip adduction      Ankle dorsiflexion (L4)      Ankle plantarflexion (S1)      Ankle inversion      Ankle eversion      Great Toe ext (L5)      Grossly        (Blank rows = not tested, score listed is out of 5 possible points.  N = WNL, D = diminished, C = clear for gross weakness with myotome testing, * = concordant pain with testing)   LE ROM:   ROM Right 06/05/2021 Left 06/05/2021  Hip flexion      Hip extension      Hip abduction      Hip adduction      Hip internal rotation limited limited  Hip external rotation N N  Knee flexion N N  Knee extension N N  Ankle dorsiflexion      Ankle plantarflexion      Ankle inversion      Ankle eversion        (Blank rows = not tested, N = WNL, * = concordant pain with testing)   Functional Tests   Eval (06/05/2021)      Progressive balance screen (highest level completed for >/= 10''):   Tandem: R in rear 3'', L in rear 10'' SLS: R unable, L unable  LOWER EXTREMITY SPECIAL TESTS:  Knee special tests: ligamentous testing and mcmurray's (-)     PATIENT SURVEYS:  KOOS, JR. Score: 14 / 28, Interval Score: 52.465 / 100     TODAY'S TREATMENT:  [facility]  Adult PT Treatment:                                                DATE: 07/01/2021 Mid-session vitals: O2 sat: 96%, HR: 130 bpm Therapeutic Exercise: Lateral heel taps on 4-inch step 3x10 on Lt Standing gastroc slant board stretch x2 min Seated BIL hamstring curls with 45# cable 2x10 with 5-sec eccentric return Standing hip flexion with subsequent knee extension with 7# cable to ankle 2x8 BIL Standing hip abduction with 10# cable to ankle 2x10 BIL Manual Therapy: N/A Neuromuscular re-ed: N/A Therapeutic Activity: N/A Modalities: N/A Self Care: N/A   [facility] Adult PT Treatment:                                                DATE: 06/24/2021 Pre-session O2 sats 98% HR 111 BPM Therapeutic Exercise: LAQ 2x10 Seated hamstring curl GTB 2x10 BIL Seated marching - GTB 2x10 Seated clamshells 2x10 Seated heel/toe raises 2x10 SLR 2x10 BIL Bridge 2x10 Supine hamstring stretch 2x30" BIL Neuromuscular re-ed: Tandem stance BIL 2x30"   06/05/221: Creating, reviewing, and completing below HEP     PATIENT EDUCATION:  POC, diagnosis, prognosis, HEP, and outcome measures.  Pt educated via explanation, demonstration, and handout (HEP).  Pt confirms understanding verbally.    HOME EXERCISE PROGRAM: Access Code: [email or web address] URL: [email or web address] Date: 06/05/2021 Prepared by: Alphonzo Severance   Exercises - Supine Quad Set  - 3 x daily - 7 x weekly - 2 sets - 10 reps - 10 hold - Active Straight Leg Raise with Quad Set  - 1 x daily - 7 x weekly - 3 sets - 10 reps - Supine Bridge  - 1 x daily - 7 x weekly - 3 sets - 10 reps   ASTERISK SIGNS     Asterisk Signs Eval (06/05/2021)            Knee ext mmt 3+            balance Tandem with R in rear <3''                                                             ASSESSMENT:   CLINICAL IMPRESSION: Pt responded well to all interventions today, demonstrating good form and no increase in knee pain with progressed exercises.  Upon mild SOB reported during standing exercises, pt's vitals were assessed, which were in WNL therapeutic range for exercise. He was given a short seated rest, and he reported no symptoms after one minute. He was able to complete the session without difficulty. Pt will continue to benefit from skilled PT to address his primary impairments and return to his prior level of function with less limitation.    OBJECTIVE IMPAIRMENTS: Pain, knee/LE/hip strength, balance   ACTIVITY LIMITATIONS: bending, squatting,  lifting   PERSONAL FACTORS: See medical history and pertinent history        GOALS:     SHORT TERM GOALS: Target date: 06/26/2021   Rahmeek will be >75% HEP compliant to improve carryover between sessions and facilitate independent management of condition   Evaluation (06/05/2021): ongoing Goal status: INITIAL     LONG TERM GOALS: Target date: 07/31/2021   Johnatan will show a >/= 20 pt improvement in his KOOS Jr. score (MCID is 15 pts) as a proxy for functional improvement    Evaluation/Baseline (06/05/2021): KOOS, JR. Score: 14 / 28, Interval Score: 52.465 / 100 pts Goal status: INITIAL     2.  Atari will be able to stand for >30'' in tandem stance, to show a significant improvement in balance in order to reduce fall risk    Evaluation/Baseline (06/05/2021): < 3'' R in rear Goal status: INITIAL     3.  Kassidy will report confidence in self management of condition at time of discharge with advanced HEP   Evaluation/Baseline (06/05/2021): unable to self manage Goal status: INITIAL     4.  Enos will improve the following MMTs to >/= 4/5 to show improvement in strength:  R knee ext    Evaluation/Baseline (06/05/2021): see chart in note Goal status: INITIAL     PLAN: PT FREQUENCY: 1-2x/week   PT DURATION: 8 weeks (Ending 07/31/2021)   PLANNED INTERVENTIONS: Therapeutic exercises, Aquatic therapy, Therapeutic activity, Neuro Muscular re-education, Gait training, Patient/Family education,  Joint mobilization, Dry Needling, Electrical stimulation, Spinal mobilization and/or manipulation, Moist heat, Taping, Vasopneumatic device, Ionotophoresis 4mg /ml Dexamethasone, and Manual therapy   PLAN FOR NEXT SESSION: progressive knee/hip/LE strengthening (with emphysema + COPD and inguinal hernia in mind), progressive balance     Vanessa Snead, PT, DPT 07/01/21 5:01 PM

## 2021-07-04 DIAGNOSIS — J449 Chronic obstructive pulmonary disease, unspecified: Secondary | ICD-10-CM | POA: Diagnosis not present

## 2021-07-06 ENCOUNTER — Ambulatory Visit: Payer: Medicaid Other | Attending: Critical Care Medicine

## 2021-07-06 ENCOUNTER — Other Ambulatory Visit (HOSPITAL_BASED_OUTPATIENT_CLINIC_OR_DEPARTMENT_OTHER): Payer: Self-pay

## 2021-07-06 DIAGNOSIS — M25561 Pain in right knee: Secondary | ICD-10-CM | POA: Insufficient documentation

## 2021-07-06 DIAGNOSIS — G8929 Other chronic pain: Secondary | ICD-10-CM | POA: Diagnosis not present

## 2021-07-06 DIAGNOSIS — R2681 Unsteadiness on feet: Secondary | ICD-10-CM | POA: Diagnosis not present

## 2021-07-06 NOTE — Therapy (Signed)
OUTPATIENT PHYSICAL THERAPY TREATMENT NOTE   Patient Name: Richard Fuentes MRN: [id number] DOB:06-May-1970, 51 y.o., male Today's Date: 07/06/2021  PCP: Elsie Stain, MD REFERRING PROVIDER: Elsie Stain, MD  END OF SESSION:   PT End of Session - 07/06/21 0756     Visit Number 4    Date for PT Re-Evaluation 07/31/21    Authorization Type [employer]    Authorization Time Period 06/06/2021-07/05/2021    Authorization - Visit Number 3    Authorization - Number of Visits 4    PT Start Time 0730    PT Stop Time 0800    PT Time Calculation (min) 30 min    Activity Tolerance Patient tolerated treatment well;Patient limited by fatigue    Behavior During Therapy WFL for tasks assessed/performed               Past Medical History:  Diagnosis Date   Anxiety    Asthma    COPD (chronic obstructive pulmonary disease) ([place])    Severe centrilobular and paraseptal bullous emphysema; Gold stage D; FEV1 and DLCO<30%.;  Currently undergoing transplant evaluation   Dyspnea    Environmental allergies    Esophagitis    Family history of adverse reaction to anesthesia    Paternal aunt had trouble waking   GERD (gastroesophageal reflux disease)    Headache    Hx of migraines    OCD (obsessive compulsive disorder)    OCD (obsessive compulsive disorder)    Pneumonia    Pneumothorax on left 03/20/2020   Psoriasis    Sleep apnea    Urticaria    Past Surgical History:  Procedure Laterality Date   Endobronchial valve Left 03/20/2020   LUL at [place] Right 2002   arthroscopy and cartilidge removed   KNEE SURGERY Left 1987   Ligaments removed and screw in place   Patient Active Problem List   Diagnosis Date Noted   Pharyngitis 06/17/2021   Allergic contact dermatitis due to metals 01/07/2020   OSA (obstructive sleep apnea) 09/13/2019   Hyperlipidemia with target LDL less than 100 08/20/2019   Family history of premature coronary artery disease 08/20/2019   Anxiety  08/01/2019   Perennial and seasonal allergic rhinitis 07/24/2019   Recurrent urticaria 07/24/2019   History of penicillin allergy 07/24/2019   History of food allergy 07/24/2019   Plaque psoriasis 06/05/2019   Chronic periodontal disease 03/21/2019   COPD with emphysema (HCC)GOLD D  12/07/2017   Inguinal hernia with obstruction without gangrene 02/04/2017   Generalized anxiety disorder 12/07/2011   History of tobacco use 12/07/2011    REFERRING DIAG: Referral diagnosis: Primary osteoarthritis of right knee [M17.11]  THERAPY DIAG:  Chronic pain of right knee  Unsteadiness on feet  Rationale for Evaluation and Treatment Rehabilitation  PERTINENT HISTORY: Severe COPD (watch O2 sat), Emphysema, Dyspnea, OCD, inguinal hernia, prior R knee surgery with "cartilage removal"  PRECAUTIONS: Severe COPD (watch O2 sat), Emphysema, Dyspnea, OCD, inguinal hernia  SUBJECTIVE: Patient reports he is having no knee pain currently but that his back is hurting some this morning. He believes his appointment was made incorrectly today as he normally would not have agreed to a 7:15 am appointment time.   PAIN:  Are you having pain? No Pain location: Lateral diffuse knee pain NPRS scale:  current 0/10  average 3/10  Aggravating factors: squatting, crossing leg, walking           NPRS, highest: 3/10 Relieving factors: "popping knee  back into place" rest           NPRS: best: 0/10 Pain description: intermittent Stage: Chronic Stability: staying the same 24 hour pattern: NA    OBJECTIVE: (objective measures completed at initial evaluation unless otherwise dated)   DIAGNOSTIC FINDINGS:  IMPRESSION: 1.  No evidence of meniscal tear.   2.  Cruciate and collateral ligaments are intact.   3. Tricompartmental degenerative changes of the articular cartilage without evidence of full-thickness defect.   4.  Small joint effusion with synovitis.   5.  Small Baker's cyst.   6.  No evidence of  fracture or dislocation.             GENERAL OBSERVATION/GAIT:                     R hip drop in gait, toe out with some resulting valgus collapse of bil knees   SENSATION:          Light touch: Appears intact   PALPATION: No TTP   MUSCLE LENGTH: Hamstrings: Right significant restriction; Left significant restriction ASLR: Right ASLR = PSLR; Left ASLR = PSLR Ely's test: Right moderate restriction; Left moderate restriction   LE MMT:   MMT Right 06/05/2021 Left 06/05/2021  Hip flexion (L2, L3)      Knee extension (L3) 3+ 3+  Knee flexion 4 4  Hip abduction 3+ 3+  Hip extension 3 3+  Hip external rotation      Hip internal rotation      Hip adduction      Ankle dorsiflexion (L4)      Ankle plantarflexion (S1)      Ankle inversion      Ankle eversion      Great Toe ext (L5)      Grossly        (Blank rows = not tested, score listed is out of 5 possible points.  N = WNL, D = diminished, C = clear for gross weakness with myotome testing, * = concordant pain with testing)   LE ROM:   ROM Right 06/05/2021 Left 06/05/2021  Hip flexion      Hip extension      Hip abduction      Hip adduction      Hip internal rotation limited limited  Hip external rotation N N  Knee flexion N N  Knee extension N N  Ankle dorsiflexion      Ankle plantarflexion      Ankle inversion      Ankle eversion        (Blank rows = not tested, N = WNL, * = concordant pain with testing)   Functional Tests   Eval (06/05/2021)      Progressive balance screen (highest level completed for >/= 10''):   Tandem: R in rear 3'', L in rear 10'' SLS: R unable, L unable                                                                                                      LOWER EXTREMITY  SPECIAL TESTS:  Knee special tests: ligamentous testing and mcmurray's (-)     PATIENT SURVEYS:  KOOS, JR. Score: 14 / 28, Interval Score: 52.465 / 100     TODAY'S TREATMENT: [facility] Adult PT Treatment:                                                 DATE: 07/06/2021 Mid-session vitals: O2 99%, HR 113 bpm Therapeutic Exercise: Lateral heel taps on 4-inch step 3x10 on Lt Standing gastroc slant board stretch x2 min Seated BIL hamstring curls with 45# cable 2x10 with 5-sec eccentric return Standing hip abduction with 10# cable to ankle 2x10 BIL Neuromuscular re-ed: Tandem stance 2x30" BIL   [facility] Adult PT Treatment:                                                DATE: 07/01/2021 Mid-session vitals: O2 sat: 96%, HR: 130 bpm Therapeutic Exercise: Lateral heel taps on 4-inch step 3x10 on Lt Standing gastroc slant board stretch x2 min Seated BIL hamstring curls with 45# cable 2x10 with 5-sec eccentric return Standing hip flexion with subsequent knee extension with 7# cable to ankle 2x8 BIL Standing hip abduction with 10# cable to ankle 2x10 BIL Manual Therapy: N/A Neuromuscular re-ed: N/A Therapeutic Activity: N/A Modalities: N/A Self Care: N/A   [facility] Adult PT Treatment:                                                DATE: 06/24/2021 Pre-session O2 sats 98% HR 111 BPM Therapeutic Exercise: LAQ 2x10 Seated hamstring curl GTB 2x10 BIL Seated marching - GTB 2x10 Seated clamshells 2x10 Seated heel/toe raises 2x10 SLR 2x10 BIL Bridge 2x10 Supine hamstring stretch 2x30" BIL Neuromuscular re-ed: Tandem stance BIL 2x30"     PATIENT EDUCATION:  POC, diagnosis, prognosis, HEP, and outcome measures.  Pt educated via explanation, demonstration, and handout (HEP).  Pt confirms understanding verbally.    HOME EXERCISE PROGRAM: Access Code: [email or web address] URL: [email or web address] Date: 06/05/2021 Prepared by: Alphonzo Severance   Exercises - Supine Quad Set  - 3 x daily - 7 x weekly - 2 sets - 10 reps - 10 hold - Active Straight Leg Raise with Quad Set  - 1 x daily - 7 x weekly - 3 sets - 10 reps - Supine Bridge  - 1 x daily - 7 x weekly - 3 sets - 10 reps   ASTERISK SIGNS      Asterisk Signs Eval (06/05/2021)            Knee ext mmt 3+            balance Tandem with R in rear <3''                                                             ASSESSMENT:   CLINICAL IMPRESSION: Patient arrived 14 minutes late  to session, delaying treatment time. He was fatigued throughout session due to just waking up right before session and stating he normally would not have made such an early appointment time. Session today continued to focus on LE strengthening and increasing standing activity tolerance and also balance tasks. Patient continues to benefit from skilled PT services and should be progressed as able to improve functional independence.   OBJECTIVE IMPAIRMENTS: Pain, knee/LE/hip strength, balance   ACTIVITY LIMITATIONS: bending, squatting, lifting   PERSONAL FACTORS: See medical history and pertinent history        GOALS:     SHORT TERM GOALS: Target date: 06/26/2021   Sani will be >75% HEP compliant to improve carryover between sessions and facilitate independent management of condition   Evaluation (06/05/2021): ongoing Goal status: INITIAL     LONG TERM GOALS: Target date: 07/31/2021   Erian will show a >/= 20 pt improvement in his KOOS Jr. score (MCID is 15 pts) as a proxy for functional improvement    Evaluation/Baseline (06/05/2021): KOOS, JR. Score: 14 / 28, Interval Score: 52.465 / 100 pts Goal status: INITIAL     2.  Ramesh will be able to stand for >30'' in tandem stance, to show a significant improvement in balance in order to reduce fall risk    Evaluation/Baseline (06/05/2021): < 3'' R in rear Goal status: INITIAL     3.  Tynell will report confidence in self management of condition at time of discharge with advanced HEP   Evaluation/Baseline (06/05/2021): unable to self manage Goal status: INITIAL     4.  Erik will improve the following MMTs to >/= 4/5 to show improvement in strength:  R knee ext    Evaluation/Baseline (06/05/2021): see chart in  note Goal status: INITIAL     PLAN: PT FREQUENCY: 1-2x/week   PT DURATION: 8 weeks (Ending 07/31/2021)   PLANNED INTERVENTIONS: Therapeutic exercises, Aquatic therapy, Therapeutic activity, Neuro Muscular re-education, Gait training, Patient/Family education, Joint mobilization, Dry Needling, Electrical stimulation, Spinal mobilization and/or manipulation, Moist heat, Taping, Vasopneumatic device, Ionotophoresis 4mg /ml Dexamethasone, and Manual therapy   PLAN FOR NEXT SESSION: progressive knee/hip/LE strengthening (with emphysema + COPD and inguinal hernia in mind), progressive balance    , PTA 07/06/21 7:57 AM

## 2021-07-15 ENCOUNTER — Ambulatory Visit: Payer: Medicaid Other

## 2021-07-15 DIAGNOSIS — F411 Generalized anxiety disorder: Secondary | ICD-10-CM | POA: Diagnosis not present

## 2021-07-15 DIAGNOSIS — F429 Obsessive-compulsive disorder, unspecified: Secondary | ICD-10-CM | POA: Diagnosis not present

## 2021-07-15 DIAGNOSIS — F331 Major depressive disorder, recurrent, moderate: Secondary | ICD-10-CM | POA: Diagnosis not present

## 2021-07-21 DIAGNOSIS — F4311 Post-traumatic stress disorder, acute: Secondary | ICD-10-CM | POA: Diagnosis not present

## 2021-07-21 DIAGNOSIS — F429 Obsessive-compulsive disorder, unspecified: Secondary | ICD-10-CM | POA: Diagnosis not present

## 2021-07-21 DIAGNOSIS — F411 Generalized anxiety disorder: Secondary | ICD-10-CM | POA: Diagnosis not present

## 2021-07-21 DIAGNOSIS — F331 Major depressive disorder, recurrent, moderate: Secondary | ICD-10-CM | POA: Diagnosis not present

## 2021-07-22 ENCOUNTER — Ambulatory Visit: Payer: Medicaid Other | Admitting: Physical Therapy

## 2021-07-22 ENCOUNTER — Encounter: Payer: Self-pay | Admitting: Physical Therapy

## 2021-07-22 DIAGNOSIS — R2681 Unsteadiness on feet: Secondary | ICD-10-CM | POA: Diagnosis not present

## 2021-07-22 DIAGNOSIS — G8929 Other chronic pain: Secondary | ICD-10-CM

## 2021-07-22 DIAGNOSIS — M25561 Pain in right knee: Secondary | ICD-10-CM | POA: Diagnosis not present

## 2021-07-22 NOTE — Therapy (Signed)
OUTPATIENT PHYSICAL THERAPY TREATMENT NOTE   Patient Name: Richard Fuentes MRN: [id number] DOB:12/29/1970, 51 y.o., male Today's Date: 07/22/2021  PCP: Elsie Stain, MD REFERRING PROVIDER: Elsie Stain, MD  END OF SESSION:   PT End of Session - 07/22/21 0918     Visit Number 5    Date for PT Re-Evaluation 07/31/21    Authorization Type [employer]    Authorization Time Period Approved 3 visits 07/15/2021-08/04/21    Authorization - Visit Number 5    Authorization - Number of Visits 7    PT Start Time 0917    PT Stop Time 0957    PT Time Calculation (min) 40 min    Activity Tolerance Patient tolerated treatment well;Patient limited by fatigue    Behavior During Therapy [facility] for tasks assessed/performed               Past Medical History:  Diagnosis Date   Anxiety    Asthma    COPD (chronic obstructive pulmonary disease) ([place])    Severe centrilobular and paraseptal bullous emphysema; Gold stage D; FEV1 and DLCO<30%.;  Currently undergoing transplant evaluation   Dyspnea    Environmental allergies    Esophagitis    Family history of adverse reaction to anesthesia    Paternal aunt had trouble waking   GERD (gastroesophageal reflux disease)    Headache    Hx of migraines    OCD (obsessive compulsive disorder)    OCD (obsessive compulsive disorder)    Pneumonia    Pneumothorax on left 03/20/2020   Psoriasis    Sleep apnea    Urticaria    Past Surgical History:  Procedure Laterality Date   Endobronchial valve Left 03/20/2020   LUL at [place] Right 2002   arthroscopy and cartilidge removed   KNEE SURGERY Left 1987   Ligaments removed and screw in place   Patient Active Problem List   Diagnosis Date Noted   Pharyngitis 06/17/2021   Allergic contact dermatitis due to metals 01/07/2020   OSA (obstructive sleep apnea) 09/13/2019   Hyperlipidemia with target LDL less than 100 08/20/2019   Family history of premature coronary artery disease  08/20/2019   Anxiety 08/01/2019   Perennial and seasonal allergic rhinitis 07/24/2019   Recurrent urticaria 07/24/2019   History of penicillin allergy 07/24/2019   History of food allergy 07/24/2019   Plaque psoriasis 06/05/2019   Chronic periodontal disease 03/21/2019   COPD with emphysema (HCC)GOLD D  12/07/2017   Inguinal hernia with obstruction without gangrene 02/04/2017   Generalized anxiety disorder 12/07/2011   History of tobacco use 12/07/2011    REFERRING DIAG: Referral diagnosis: Primary osteoarthritis of right knee [M17.11]  THERAPY DIAG:  Chronic pain of right knee  Unsteadiness on feet  Rationale for Evaluation and Treatment Rehabilitation  PERTINENT HISTORY: Severe COPD (watch O2 sat), Emphysema, Dyspnea, OCD, inguinal hernia, prior R knee surgery with "cartilage removal"  PRECAUTIONS: Severe COPD (watch O2 sat), Emphysema, Dyspnea, OCD, inguinal hernia  SUBJECTIVE: Patient reports he is having no knee pain currently but that his back is hurting some this morning. He believes his appointment was made incorrectly today as he normally would not have agreed to a 7:15 am appointment time.   PAIN:  Are you having pain? No Pain location: Lateral diffuse knee pain NPRS scale:  current 0/10  average 3/10  Aggravating factors: squatting, crossing leg, walking           NPRS, highest: 3/10 Relieving  factors: "popping knee back into place" rest           NPRS: best: 0/10 Pain description: intermittent Stage: Chronic Stability: staying the same 24 hour pattern: NA    OBJECTIVE: (objective measures completed at initial evaluation unless otherwise dated)   DIAGNOSTIC FINDINGS:  IMPRESSION: 1.  No evidence of meniscal tear.   2.  Cruciate and collateral ligaments are intact.   3. Tricompartmental degenerative changes of the articular cartilage without evidence of full-thickness defect.   4.  Small joint effusion with synovitis.   5.  Small Baker's cyst.    6.  No evidence of fracture or dislocation.             GENERAL OBSERVATION/GAIT:                     R hip drop in gait, toe out with some resulting valgus collapse of bil knees   SENSATION:          Light touch: Appears intact   PALPATION: No TTP   MUSCLE LENGTH: Hamstrings: Right significant restriction; Left significant restriction ASLR: Right ASLR = PSLR; Left ASLR = PSLR Ely's test: Right moderate restriction; Left moderate restriction   LE MMT:   MMT Right 06/05/2021 Left 06/05/2021  Hip flexion (L2, L3)      Knee extension (L3) 3+ 3+  Knee flexion 4 4  Hip abduction 3+ 3+  Hip extension 3 3+  Hip external rotation      Hip internal rotation      Hip adduction      Ankle dorsiflexion (L4)      Ankle plantarflexion (S1)      Ankle inversion      Ankle eversion      Great Toe ext (L5)      Grossly        (Blank rows = not tested, score listed is out of 5 possible points.  N = WNL, D = diminished, C = clear for gross weakness with myotome testing, * = concordant pain with testing)   LE ROM:   ROM Right 06/05/2021 Left 06/05/2021  Hip flexion      Hip extension      Hip abduction      Hip adduction      Hip internal rotation limited limited  Hip external rotation N N  Knee flexion N N  Knee extension N N  Ankle dorsiflexion      Ankle plantarflexion      Ankle inversion      Ankle eversion        (Blank rows = not tested, N = WNL, * = concordant pain with testing)   Functional Tests   Eval (06/05/2021)      Progressive balance screen (highest level completed for >/= 10''):   Tandem: R in rear 3'', L in rear 10'' SLS: R unable, L unable  LOWER EXTREMITY SPECIAL TESTS:  Knee special tests: ligamentous testing and mcmurray's (-)     PATIENT SURVEYS:  KOOS, JR. Score: 14 / 28, Interval Score: 52.465 / 100     TODAY'S TREATMENT: [facility] Adult  PT Treatment:                                                DATE: 07/22/2021  Therapeutic Exercise: nu-step L5 14m while taking subjective and planning session with patient Sit to stand with 1 airex - 2x10 4'' step up 2x10 ea fwd and lat Hip abduction machine - 25# - 2x10 ea  Neuromuscular re-ed: Tandem stance 2x45" BIL Semi tandem stance on foam 2x45" BIL Tandem on foam 2x45" BIL Wooden rocker board - 2'  [facility] Adult PT Treatment:                                                DATE: 07/06/2021 Mid-session vitals: O2 99%, HR 113 bpm Therapeutic Exercise: Lateral heel taps on 4-inch step 3x10 on Lt Standing gastroc slant board stretch x2 min Seated BIL hamstring curls with 45# cable 2x10 with 5-sec eccentric return Standing hip abduction with 10# cable to ankle 2x10 BIL Neuromuscular re-ed: Tandem stance 2x30" BIL   [facility] Adult PT Treatment:                                                DATE: 07/01/2021 Mid-session vitals: O2 sat: 96%, HR: 130 bpm Therapeutic Exercise: Lateral heel taps on 4-inch step 3x10 on Lt Standing gastroc slant board stretch x2 min Seated BIL hamstring curls with 45# cable 2x10 with 5-sec eccentric return Standing hip flexion with subsequent knee extension with 7# cable to ankle 2x8 BIL Standing hip abduction with 10# cable to ankle 2x10 BIL Manual Therapy: N/A Neuromuscular re-ed: N/A Therapeutic Activity: N/A Modalities: N/A Self Care: N/A   [facility] Adult PT Treatment:                                                DATE: 06/24/2021 Pre-session O2 sats 98% HR 111 BPM Therapeutic Exercise: LAQ 2x10 Seated hamstring curl GTB 2x10 BIL Seated marching - GTB 2x10 Seated clamshells 2x10 Seated heel/toe raises 2x10 SLR 2x10 BIL Bridge 2x10 Supine hamstring stretch 2x30" BIL Neuromuscular re-ed: Tandem stance BIL 2x30"     PATIENT EDUCATION:  POC, diagnosis, prognosis, HEP, and outcome measures.  Pt educated via explanation, demonstration, and  handout (HEP).  Pt confirms understanding verbally.    HOME EXERCISE PROGRAM: Access Code: [email or web address] URL: [email or web address] Date: 06/05/2021 Prepared by: Shearon Balo   Exercises - Supine Quad Set  - 3 x daily - 7 x weekly - 2 sets - 10 reps - 10 hold - Active Straight Leg Raise with Quad Set  - 1 x daily - 7 x weekly - 3 sets - 10 reps - Supine Bridge  - 1 x daily - 7 x weekly - 3 sets -  10 reps   ASTERISK SIGNS     Asterisk Signs Eval (06/05/2021)            Knee ext mmt 3+            balance Tandem with R in rear <3''                                                             ASSESSMENT:   CLINICAL IMPRESSION: Markell tolerated session well with no adverse reaction.  02 sat and pulse monitored during moderate and high exertion activities and remained WNL.  He shows gross weakness but overall his strength and endurance is significantly improved.  OBJECTIVE IMPAIRMENTS: Pain, knee/LE/hip strength, balance   ACTIVITY LIMITATIONS: bending, squatting, lifting   PERSONAL FACTORS: See medical history and pertinent history        GOALS:     SHORT TERM GOALS: Target date: 06/26/2021   Beacher will be >75% HEP compliant to improve carryover between sessions and facilitate independent management of condition   Evaluation (06/05/2021): ongoing /Goal status: MET 7/19     LONG TERM GOALS: Target date: 07/31/2021   Shine will show a >/= 20 pt improvement in his KOOS Jr. score (MCID is 15 pts) as a proxy for functional improvement    Evaluation/Baseline (06/05/2021): KOOS, JR. Score: 14 / 28, Interval Score: 52.465 / 100 pts Goal status: INITIAL     2.  Trygve will be able to stand for >30'' in tandem stance, to show a significant improvement in balance in order to reduce fall risk    Evaluation/Baseline (06/05/2021): < 3'' R in rear Goal status: INITIAL     3.  Oluwademilade will report confidence in self management of condition at time of discharge with advanced HEP    Evaluation/Baseline (06/05/2021): unable to self manage Goal status: INITIAL     4.  Knoxx will improve the following MMTs to >/= 4/5 to show improvement in strength:  R knee ext    Evaluation/Baseline (06/05/2021): see chart in note Goal status: INITIAL     PLAN: PT FREQUENCY: 1-2x/week   PT DURATION: 8 weeks (Ending 07/31/2021)   PLANNED INTERVENTIONS: Therapeutic exercises, Aquatic therapy, Therapeutic activity, Neuro Muscular re-education, Gait training, Patient/Family education, Joint mobilization, Dry Needling, Electrical stimulation, Spinal mobilization and/or manipulation, Moist heat, Taping, Vasopneumatic device, Ionotophoresis 4mg /ml Dexamethasone, and Manual therapy   PLAN FOR NEXT SESSION: progressive knee/hip/LE strengthening (with emphysema + COPD and inguinal hernia in mind), progressive balance    Evelene Croon, PTA 07/22/21 9:56 AM

## 2021-07-26 ENCOUNTER — Encounter: Payer: Self-pay | Admitting: Critical Care Medicine

## 2021-07-27 ENCOUNTER — Other Ambulatory Visit (HOSPITAL_BASED_OUTPATIENT_CLINIC_OR_DEPARTMENT_OTHER): Payer: Self-pay

## 2021-07-29 ENCOUNTER — Ambulatory Visit: Payer: Medicaid Other

## 2021-07-29 ENCOUNTER — Telehealth: Payer: Self-pay | Admitting: Emergency Medicine

## 2021-07-29 ENCOUNTER — Ambulatory Visit: Payer: Medicaid Other | Attending: Critical Care Medicine

## 2021-07-29 DIAGNOSIS — Z23 Encounter for immunization: Secondary | ICD-10-CM

## 2021-07-29 NOTE — Telephone Encounter (Signed)
Copied from CRM [id number]. Topic: General - Call Back - No Documentation >> Jul 27, 2021  2:02 PM Franchot Heidelberg wrote: Reason for CRM: Pt's [employer] called to update the insurance/provider information. Roxanna reports that the practice NPI was not accepted. She is asking for office to follow up with pt so the patient can follow up with [employer] contact: [phone]

## 2021-07-29 NOTE — Progress Notes (Signed)
Pt arrived for PCV-20 Vaccine Pt given vaccine in Left deltoid muscle Pt tolerated vaccine well.

## 2021-07-30 NOTE — Telephone Encounter (Signed)
Can you help me with this?

## 2021-07-31 DIAGNOSIS — F429 Obsessive-compulsive disorder, unspecified: Secondary | ICD-10-CM | POA: Diagnosis not present

## 2021-07-31 DIAGNOSIS — F4311 Post-traumatic stress disorder, acute: Secondary | ICD-10-CM | POA: Diagnosis not present

## 2021-08-04 ENCOUNTER — Ambulatory Visit: Payer: Medicaid Other | Attending: Critical Care Medicine

## 2021-08-04 DIAGNOSIS — R2681 Unsteadiness on feet: Secondary | ICD-10-CM | POA: Insufficient documentation

## 2021-08-04 DIAGNOSIS — G8929 Other chronic pain: Secondary | ICD-10-CM | POA: Insufficient documentation

## 2021-08-04 DIAGNOSIS — M25561 Pain in right knee: Secondary | ICD-10-CM | POA: Insufficient documentation

## 2021-08-04 DIAGNOSIS — J449 Chronic obstructive pulmonary disease, unspecified: Secondary | ICD-10-CM | POA: Diagnosis not present

## 2021-08-04 NOTE — Therapy (Signed)
OUTPATIENT PHYSICAL THERAPY TREATMENT NOTE/ RE-EVALUATION/ DISCHARGE SUMMARY   Patient Name: Richard Fuentes MRN: [id number] DOB:Jun 03, 1970, 51 y.o., male Today's Date: 08/04/2021  PCP: Elsie Stain, MD REFERRING PROVIDER: Elsie Stain, MD  END OF SESSION:   PT End of Session - 08/04/21 0928     Visit Number 6    Date for PT Re-Evaluation 08/04/21    Authorization Type [employer]    Authorization Time Period Approved 3 visits 07/15/2021-08/04/21    Authorization - Visit Number 6    Authorization - Number of Visits 7    PT Start Time 0925    PT Stop Time 0958    PT Time Calculation (min) 33 min    Activity Tolerance Patient tolerated treatment well;Patient limited by fatigue    Behavior During Therapy [facility] for tasks assessed/performed                Past Medical History:  Diagnosis Date   Anxiety    Asthma    COPD (chronic obstructive pulmonary disease) ([place])    Severe centrilobular and paraseptal bullous emphysema; Gold stage D; FEV1 and DLCO<30%.;  Currently undergoing transplant evaluation   Dyspnea    Environmental allergies    Esophagitis    Family history of adverse reaction to anesthesia    Paternal aunt had trouble waking   GERD (gastroesophageal reflux disease)    Headache    Hx of migraines    OCD (obsessive compulsive disorder)    OCD (obsessive compulsive disorder)    Pneumonia    Pneumothorax on left 03/20/2020   Psoriasis    Sleep apnea    Urticaria    Past Surgical History:  Procedure Laterality Date   Endobronchial valve Left 03/20/2020   LUL at [place] Right 2002   arthroscopy and cartilidge removed   KNEE SURGERY Left 1987   Ligaments removed and screw in place   Patient Active Problem List   Diagnosis Date Noted   Pharyngitis 06/17/2021   Allergic contact dermatitis due to metals 01/07/2020   OSA (obstructive sleep apnea) 09/13/2019   Hyperlipidemia with target LDL less than 100 08/20/2019   Family history of  premature coronary artery disease 08/20/2019   Anxiety 08/01/2019   Perennial and seasonal allergic rhinitis 07/24/2019   Recurrent urticaria 07/24/2019   History of penicillin allergy 07/24/2019   History of food allergy 07/24/2019   Plaque psoriasis 06/05/2019   Chronic periodontal disease 03/21/2019   COPD with emphysema (HCC)GOLD D  12/07/2017   Inguinal hernia with obstruction without gangrene 02/04/2017   Generalized anxiety disorder 12/07/2011   History of tobacco use 12/07/2011    REFERRING DIAG: Referral diagnosis: Primary osteoarthritis of right knee [M17.11]  THERAPY DIAG:  Chronic pain of right knee  Unsteadiness on feet  Rationale for Evaluation and Treatment Rehabilitation  PERTINENT HISTORY: Severe COPD (watch O2 sat), Emphysema, Dyspnea, OCD, inguinal hernia, prior R knee surgery with "cartilage removal"  PRECAUTIONS: Severe COPD (watch O2 sat), Emphysema, Dyspnea, OCD, inguinal hernia  SUBJECTIVE: Pt reports no change in his overall knee symptoms. He reports that he feels comfortable transitioning his in-clinic exercises to home. He plans on joining [employer] and working independently to rehab his knee. He would like to be discharged at this time.  PAIN:  Are you having pain? No Pain location: Lateral diffuse knee pain NPRS scale:  current 0/10  average 3/10  Aggravating factors: squatting, crossing leg, walking  NPRS, highest: 3/10 Relieving factors: "popping knee back into place" rest           NPRS: best: 0/10 Pain description: intermittent Stage: Chronic Stability: staying the same 24 hour pattern: NA    OBJECTIVE: (objective measures completed at initial evaluation unless otherwise dated)   DIAGNOSTIC FINDINGS:  IMPRESSION: 1.  No evidence of meniscal tear.   2.  Cruciate and collateral ligaments are intact.   3. Tricompartmental degenerative changes of the articular cartilage without evidence of full-thickness defect.   4.   Small joint effusion with synovitis.   5.  Small Baker's cyst.   6.  No evidence of fracture or dislocation.             GENERAL OBSERVATION/GAIT:                     R hip drop in gait, toe out with some resulting valgus collapse of bil knees   SENSATION:          Light touch: Appears intact   PALPATION: No TTP  08/04/2021: TTP to Rt greater trochanter   MUSCLE LENGTH: Hamstrings: Right significant restriction; Left significant restriction ASLR: Right ASLR = PSLR; Left ASLR = PSLR Ely's test: Right moderate restriction; Left moderate restriction  08/04/2021: Ober's: (+) BIL   LE MMT:   MMT Right 06/05/2021 Left 06/05/2021 Right 08/04/2021 Left 08/04/2021  Hip flexion (L2, L3)        Knee extension (L3) 3+ 3+ 5/5 5/5  Knee flexion 4 4 5/5 5/5  Hip abduction 3+ 3+ 4+/5 4/5  Hip extension 3 3+ 3+/5 3+/5  Hip external rotation        Hip internal rotation        Hip adduction        Ankle dorsiflexion (L4)        Ankle plantarflexion (S1)        Ankle inversion        Ankle eversion        Great Toe ext (L5)        Grossly          (Blank rows = not tested, score listed is out of 5 possible points.  N = WNL, D = diminished, C = clear for gross weakness with myotome testing, * = concordant pain with testing)   LE ROM:   ROM Right 06/05/2021 Left 06/05/2021  Hip flexion      Hip extension      Hip abduction      Hip adduction      Hip internal rotation limited limited  Hip external rotation N N  Knee flexion N N  Knee extension N N  Ankle dorsiflexion      Ankle plantarflexion      Ankle inversion      Ankle eversion        (Blank rows = not tested, N = WNL, * = concordant pain with testing)   Functional Tests   Eval (06/05/2021)  08/04/2021    Progressive balance screen (highest level completed for >/= 10''):   Tandem: R in rear 3'', L in rear 10'' SLS: R unable, L unable  Tandem: Rt in rear: 30 seconds, Lt in rear: 30 seconds  SLS: Rt: 30 seconds, Lt: 30 seconds  LOWER EXTREMITY SPECIAL TESTS:  Knee special tests: ligamentous testing and mcmurray's (-)     PATIENT SURVEYS:  KOOS, JR. Score: 14 / 28, Interval Score: 52.465 / 100  08/04/2021: 17/28, 53.6%     TODAY'S TREATMENT:  [facility] Adult PT Treatment:                                                DATE: 08/04/2021 Therapeutic Exercise: Standing IT band stretch x78min BIL Bulgarian split squat 2x10 BIL Manual Therapy: N/A Neuromuscular re-ed: N/A Therapeutic Activity: Re-assessment of objective measures with pt education Re-administration of KOOS, Jr. with pt education  Modalities: N/A Self Care: N/A   [facility] Adult PT Treatment:                                                DATE: 07/22/2021  Therapeutic Exercise: nu-step L5 76m while taking subjective and planning session with patient Sit to stand with 1 airex - 2x10 4'' step up 2x10 ea fwd and lat Hip abduction machine - 25# - 2x10 ea  Neuromuscular re-ed: Tandem stance 2x45" BIL Semi tandem stance on foam 2x45" BIL Tandem on foam 2x45" BIL Wooden rocker board - 2'  [facility] Adult PT Treatment:                                                DATE: 07/06/2021 Mid-session vitals: O2 99%, HR 113 bpm Therapeutic Exercise: Lateral heel taps on 4-inch step 3x10 on Lt Standing gastroc slant board stretch x2 min Seated BIL hamstring curls with 45# cable 2x10 with 5-sec eccentric return Standing hip abduction with 10# cable to ankle 2x10 BIL Neuromuscular re-ed: Tandem stance 2x30" BIL       PATIENT EDUCATION:  POC, diagnosis, prognosis, HEP, and outcome measures.  Pt educated via explanation, demonstration, and handout (HEP).  Pt confirms understanding verbally.    HOME EXERCISE PROGRAM: Access Code: [email or web address] URL: [email or web address] Date: 06/05/2021 Prepared by: Shearon Balo   Exercises - Supine Quad  Set  - 3 x daily - 7 x weekly - 2 sets - 10 reps - 10 hold - Active Straight Leg Raise with Quad Set  - 1 x daily - 7 x weekly - 3 sets - 10 reps - Supine Bridge  - 1 x daily - 7 x weekly - 3 sets - 10 reps  Added 08/04/2021: - Standing ITB Stretch  - 1 x daily - 7 x weekly - 2-min hold - Single Leg Lunge with Foot on Bench  - 1 x daily - 7 x weekly - 2 sets - 10 reps   ASTERISK SIGNS     Asterisk Signs Eval (06/05/2021)            Knee ext mmt 3+            balance Tandem with R in rear <3''  ASSESSMENT:   CLINICAL IMPRESSION: Upon re-assessment of objective measures, the pt has made excellent progress in balance and global LE strength. However, he continues to have lateral Rt knee pain. Upon further re-assessment, the pt has tight BIL IT bands and TTP to Rt greater trochanter. Ruling up ITBS as a probable underlying cause of pt's remaining symptoms. Pt's HEP was updated to reflect these findings and to target his impairments. He is discharged at this time due to pt's request.  OBJECTIVE IMPAIRMENTS: Pain, knee/LE/hip strength, balance   ACTIVITY LIMITATIONS: bending, squatting, lifting   PERSONAL FACTORS: See medical history and pertinent history        GOALS:     SHORT TERM GOALS: Target date: 06/26/2021   Zymarion will be >75% HEP compliant to improve carryover between sessions and facilitate independent management of condition   Evaluation (06/05/2021): ongoing /Goal status: MET 7/19     LONG TERM GOALS: Target date: 07/31/2021   Alister will show a >/= 20 pt improvement in his KOOS Jr. score (MCID is 15 pts) as a proxy for functional improvement    Evaluation/Baseline (06/05/2021): KOOS, JR. Score: 14 / 28, Interval Score: 52.465 / 100 pts 08/04/2021: 15/28, 53.6% Goal status: NOT MET     2.  Shaun will be able to stand for >30'' in tandem stance, to show a significant improvement in balance in order to reduce fall risk     Evaluation/Baseline (06/05/2021): < 3'' R in rear 08/04/2021: 30" BIL Goal status: ACHIEVED     3.  Almalik will report confidence in self management of condition at time of discharge with advanced HEP   Evaluation/Baseline (06/05/2021): unable to self manage 08/04/2021: ACHIEVED Goal status: ACHIEVED     4.  Teresa will improve the following MMTs to >/= 4/5 to show improvement in strength:  R knee ext    Evaluation/Baseline (06/05/2021): see chart in note 08/04/2021: 5/5 BIL Goal status: ACHIEVED     PLAN: PT FREQUENCY: 1-2x/week   PT DURATION: 8 weeks (Ending 07/31/2021)   PLANNED INTERVENTIONS: Therapeutic exercises, Aquatic therapy, Therapeutic activity, Neuro Muscular re-education, Gait training, Patient/Family education, Joint mobilization, Dry Needling, Electrical stimulation, Spinal mobilization and/or manipulation, Moist heat, Taping, Vasopneumatic device, Ionotophoresis 4mg /ml Dexamethasone, and Manual therapy   PLAN FOR NEXT SESSION: Pt is discharged from PT at this time.   PHYSICAL THERAPY DISCHARGE SUMMARY  Visits from Start of Care: 6  Current functional level related to goals / functional outcomes: Pt has met all of his rehab goals except for his KOOS, Jr. goal   Remaining deficits: Continued report of knee pain and difficulty with balance   Education / Equipment: Updated HEP   Patient agrees to discharge. Patient goals were partially met. Patient is being discharged due to being pleased with the current functional level.    Vanessa Burnham, PT, DPT 08/04/21 9:58 AM

## 2021-08-05 DIAGNOSIS — F411 Generalized anxiety disorder: Secondary | ICD-10-CM | POA: Diagnosis not present

## 2021-08-05 DIAGNOSIS — F429 Obsessive-compulsive disorder, unspecified: Secondary | ICD-10-CM | POA: Diagnosis not present

## 2021-08-05 DIAGNOSIS — F4311 Post-traumatic stress disorder, acute: Secondary | ICD-10-CM | POA: Diagnosis not present

## 2021-08-05 DIAGNOSIS — F331 Major depressive disorder, recurrent, moderate: Secondary | ICD-10-CM | POA: Diagnosis not present

## 2021-08-10 DIAGNOSIS — R0602 Shortness of breath: Secondary | ICD-10-CM | POA: Diagnosis not present

## 2021-08-10 DIAGNOSIS — R918 Other nonspecific abnormal finding of lung field: Secondary | ICD-10-CM | POA: Diagnosis not present

## 2021-08-10 DIAGNOSIS — J432 Centrilobular emphysema: Secondary | ICD-10-CM | POA: Diagnosis not present

## 2021-08-10 DIAGNOSIS — R0609 Other forms of dyspnea: Secondary | ICD-10-CM | POA: Diagnosis not present

## 2021-08-10 DIAGNOSIS — Z87891 Personal history of nicotine dependence: Secondary | ICD-10-CM | POA: Diagnosis not present

## 2021-08-20 DIAGNOSIS — F411 Generalized anxiety disorder: Secondary | ICD-10-CM | POA: Diagnosis not present

## 2021-08-20 DIAGNOSIS — F331 Major depressive disorder, recurrent, moderate: Secondary | ICD-10-CM | POA: Diagnosis not present

## 2021-08-20 DIAGNOSIS — F4311 Post-traumatic stress disorder, acute: Secondary | ICD-10-CM | POA: Diagnosis not present

## 2021-08-20 DIAGNOSIS — F429 Obsessive-compulsive disorder, unspecified: Secondary | ICD-10-CM | POA: Diagnosis not present

## 2021-08-24 ENCOUNTER — Other Ambulatory Visit (HOSPITAL_BASED_OUTPATIENT_CLINIC_OR_DEPARTMENT_OTHER): Payer: Self-pay

## 2021-08-25 ENCOUNTER — Encounter: Payer: Self-pay | Admitting: Critical Care Medicine

## 2021-08-25 ENCOUNTER — Other Ambulatory Visit (HOSPITAL_BASED_OUTPATIENT_CLINIC_OR_DEPARTMENT_OTHER): Payer: Self-pay

## 2021-08-25 ENCOUNTER — Ambulatory Visit: Payer: Medicaid Other | Attending: Critical Care Medicine | Admitting: Critical Care Medicine

## 2021-08-25 DIAGNOSIS — J431 Panlobular emphysema: Secondary | ICD-10-CM

## 2021-08-25 DIAGNOSIS — K4031 Unilateral inguinal hernia, with obstruction, without gangrene, recurrent: Secondary | ICD-10-CM

## 2021-08-25 DIAGNOSIS — L400 Psoriasis vulgaris: Secondary | ICD-10-CM

## 2021-08-25 NOTE — Assessment & Plan Note (Signed)
Observing this for now he is not a good candidate for general anesthesia

## 2021-08-25 NOTE — Progress Notes (Signed)
Established Patient Office Visit  Subjective:  Patient ID: Richard Fuentes, male    DOB: Jun 01, 1970  Age: 51 y.o. MRN: [id number]   CC: Primary care follow-up  HPI 01/27/21 Jayson Waterhouse presents for primary care follow-up history of severe COPD with significant centrilobular emphysema and chronic respiratory failure..  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  Since this procedure was done in the past month he has slowly improved although he had quite a bit of coughing and dyspnea immediately following the procedure.  Follow-up chest x-ray did not show any pneumothorax.  The patient eventually will need to have inguinal hernia repair but needs to have some dental work first.  He needs to recover from the bronchial valve surgery for any of these procedures can occur.  His saturations when he walks 15 feet are more drop in the 80% range.  He is not been using his oxygen during the day with ambulation only at night.  At rest his oxygenation averages 98 to 94%.  He is using his Trelegy daily.  He does have his nebulizer.  He was given cough syrup and he has been coughing quite a bit but is been not wanting to use it.  He is encouraged to use the cough medication given by the [facility] lung physicians.  He is not having significant chest pain at this time.  He states in the last few days he is improved.  X-ray does show gradual deflation of the left upper lobe as intended.  03/23/21 Patient returns for in person visit.  Patient's been to [facility] and has been seen by the pulmonary department and has had an improvement in his lung function after the pulmonary valves were placed his residual volume has gone down to 175% predicted from 211% predicted.  His FEV1 has increased from 29% predicted to 44% predicted.  He still wears oxygen at night.  Coughing spells have improved.  He still has occasional hemoptysis this is due  to the valve repositionings in the left upper lobe.  CT of the chest show progressive loss of volume as expected left upper lobe.  Patient needs lab screenings at this visit.  Patient has appoint with [facility] surgery March 28 and also with a dental clinic there as well encouraged to keep both of his appointments.  He has a significant left inguinal hernia that needs to be repaired.  Barrier has been not being able to be cleared from a dental perspective and not having to undergo all his pulmonary work.  On arrival blood pressure is good 116/82 Patient still follows with dermatology for his psoriatic condition.  He is only on topical creams at this time.  They are looking to see if he can get molecular biological Medicaid will cover. Patient still with generalized anxiety and has had positive response to counseling with our clinical social  5/16 This patient is seen in short-term follow-up and had some exacerbation of his COPD but it self resolved.  Mucus is minimal and production.  He has bronchial valves in the left upper lobe with lung volume reduction.  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  He is requesting physical therapy.  Dermatology needs to treat psoriasis he did have a negative QuantiFERON gold he will send them the report.  Patient has referral to a  new mental health team at [place] he is making this appointment.  Hernia surgeries been put on hold till he gets further dental work he does have a dental appointment May 21 he is encouraged to keep this appointment.  Patient has no other complaints.  On arrival blood pressure 104/72  8/22 This patient is seen in return follow-up unfortunately it looks like his pulmonary valves in his left upper lobe have moved out of position and he has reinflated his left upper lobe.  He has had more dyspnea with this over the past 2 months.  He had a mild exacerbation 2 days ago.   He is coughing quite a bit minimally productive.  He does not have a flutter valve.  He tried tamsulosin but did not like how it affected his semen production when he had ejaculation.  The patient has not been using his Trelegy this week. Past Medical History:  Diagnosis Date   Anxiety    Asthma    COPD (chronic obstructive pulmonary disease) (HCC)    Severe centrilobular and paraseptal bullous emphysema; Gold stage D; FEV1 and DLCO<30%.;  Currently undergoing transplant evaluation   Dyspnea    Environmental allergies    Esophagitis    Family history of adverse reaction to anesthesia    Paternal aunt had trouble waking   GERD (gastroesophageal reflux disease)    Headache    Hx of migraines    OCD (obsessive compulsive disorder)    OCD (obsessive compulsive disorder)    Pneumonia    Pneumothorax on left 03/20/2020   Psoriasis    Sleep apnea    Urticaria     Past Surgical History:  Procedure Laterality Date   Endobronchial valve Left 03/20/2020   LUL at [place] Right 2002   arthroscopy and cartilidge removed   KNEE SURGERY Left 1987   Ligaments removed and screw in place    Family History  Problem Relation Age of Onset   Multiple sclerosis Sister    Psoriasis Maternal Grandmother    Heart attack Mother        Late in life   Heart attack Father 30   CAD Father    Heart attack Paternal Grandfather 56       Sudden cardiac death   Sudden Cardiac Death Paternal Grandfather 20   Heart attack Maternal Uncle    CAD Maternal Uncle    Heart attack Cousin 32   Sudden Cardiac Death Cousin 62   Allergic rhinitis Neg Hx    Angioedema Neg Hx    Asthma Neg Hx    Eczema Neg Hx    Immunodeficiency Neg Hx    Urticaria Neg Hx     Social History   Socioeconomic History   Marital status: Significant Other    Spouse name: Not on file   Number of children: Not on file   Years of education: Not on file   Highest education level: Not on file  Occupational History    Not on file  Tobacco Use   Smoking status: Former    Packs/day: 1.00    Years: 30.00    Total pack years: 30.00    Types: Cigarettes    Quit date: 01/19/2018    Years since quitting: 3.6   Smokeless tobacco: Never  Vaping Use   Vaping Use: Never used  Substance and Sexual Activity   Alcohol use: No   Drug use: No   Sexual activity: Not Currently  Other Topics  Concern   Not on file  Social History Narrative   Not on file   Social Determinants of Health   Financial Resource Strain: Not on file  Food Insecurity: Not on file  Transportation Needs: Not on file  Physical Activity: Not on file  Stress: Not on file  Social Connections: Not on file  Intimate Partner Violence: Not on file    Outpatient Medications Prior to Visit  Medication Sig Dispense Refill   albuterol (VENTOLIN HFA) 108 (90 Base) MCG/ACT inhaler Inhale 2 puffs into the lungs every 6 (six) hours as needed for wheezing or shortness of breath. 18 g 2   atorvastatin (LIPITOR) 20 MG tablet Take 1 tablet (20 mg total) by mouth daily. 90 tablet 3   benzonatate (TESSALON) 100 MG capsule Take 1 capsule (100 mg total) by mouth every 8 (eight) hours. 90 capsule 2   calcipotriene (DOVONOX) 0.005 % cream Apply topically 2 (two) times daily. 60 g 0   desonide (DESOWEN) 0.05 % cream Apply 1 application topically 2 (two) times daily as needed (psoriasis).     EPINEPHrine 0.3 mg/0.3 mL IJ SOAJ injection Inject 0.3 mg into the muscle as needed for anaphylaxis. 2 each 1   esomeprazole (NEXIUM) 40 MG capsule TAKE ONE CAPSULE BY MOUTH TWICE A DAY BEFORE A MEAL 60 capsule 2   Fluticasone-Umeclidin-Vilant (TRELEGY ELLIPTA) 200-62.5-25 MCG/ACT AEPB Inhale 1 puff into the lungs daily. 60 each 4   halobetasol (ULTRAVATE) 0.05 % cream Apply topically 2 (two) times daily. (Patient taking differently: Apply 1 application  topically 2 (two) times daily as needed (irritation).) 50 g 0   hydrocortisone 2.5 % cream Apply 1 application topically 2  (two) times daily as needed (psoriasis).     ipratropium-albuterol (DUONEB) 0.5-2.5 (3) MG/3ML SOLN Inhale 3 mLs into the lungs every 6 (six) hours as needed (shortness of breath). 360 mL 0   Multiple Vitamin (MULTIVITAMIN WITH MINERALS) TABS tablet Take 1 tablet by mouth daily.     OXYGEN Inhale 2 L into the lungs continuous.     PARoxetine (PAXIL) 10 MG tablet Take one tablet by mouth in the morning. 30 tablet 0   SKYRIZI PEN 150 MG/ML SOAJ Inject into the skin.     traZODone (DESYREL) 50 MG tablet Take 0.5-1 tablets (25-50 mg total) by mouth at bedtime as needed for sleep. 30 tablet 3   lidocaine (XYLOCAINE) 2 % solution Gargle with 15 mLs by mouth every 6 hours as needed for mouth or throat pain, spit out after use. (Patient not taking: Reported on 08/25/2021) 100 mL 0   tamsulosin (FLOMAX) 0.4 MG CAPS capsule 1 capsule PO QD (Patient not taking: Reported on 08/25/2021) 30 capsule 3   No facility-administered medications prior to visit.    Allergies  Allergen Reactions   Other Anaphylaxis    Wool   Shellfish Allergy     Unknown reaction    ROS Review of Systems  Constitutional: Negative.   HENT: Negative.  Negative for ear pain, postnasal drip, rhinorrhea, sinus pressure, sore throat, trouble swallowing and voice change.   Eyes: Negative.   Respiratory:  Positive for cough, shortness of breath and wheezing. Negative for apnea, choking, chest tightness and stridor.   Cardiovascular: Negative.  Negative for chest pain, palpitations and leg swelling.  Gastrointestinal:  Negative for abdominal distention, abdominal pain, nausea and vomiting.       Left inguinal hernia still persists  Genitourinary: Negative.   Musculoskeletal: Negative.  Negative for arthralgias  and myalgias.  Skin: Negative.  Negative for rash.  Allergic/Immunologic: Negative.  Negative for environmental allergies and food allergies.  Neurological: Negative.  Negative for dizziness, syncope, weakness and headaches.   Hematological: Negative.  Negative for adenopathy. Does not bruise/bleed easily.  Psychiatric/Behavioral: Negative.  Negative for agitation and sleep disturbance. The patient is not nervous/anxious.       Objective:    Physical Exam Vitals reviewed.  Constitutional:      Appearance: Normal appearance. He is well-developed. He is not diaphoretic.  HENT:     Head: Normocephalic and atraumatic.     Nose: Nose normal. No nasal deformity, septal deviation, mucosal edema or rhinorrhea.     Right Sinus: No maxillary sinus tenderness or frontal sinus tenderness.     Left Sinus: No maxillary sinus tenderness or frontal sinus tenderness.     Mouth/Throat:     Mouth: Mucous membranes are moist.     Pharynx: Oropharynx is clear. No oropharyngeal exudate.     Comments: Poor dentition left first molar eroded with caries Eyes:     General: No scleral icterus.    Conjunctiva/sclera: Conjunctivae normal.     Pupils: Pupils are equal, round, and reactive to light.  Neck:     Thyroid: No thyromegaly.     Vascular: No carotid bruit or JVD.     Trachea: Trachea normal. No tracheal tenderness or tracheal deviation.  Cardiovascular:     Rate and Rhythm: Normal rate and regular rhythm.     Chest Wall: PMI is not displaced.     Pulses: Normal pulses. No decreased pulses.     Heart sounds: Normal heart sounds, S1 normal and S2 normal. Heart sounds not distant. No murmur heard.    No systolic murmur is present.     No diastolic murmur is present.     No friction rub. No gallop. No S3 or S4 sounds.  Pulmonary:     Effort: No tachypnea, accessory muscle usage or respiratory distress.     Breath sounds: No stridor. No decreased breath sounds, wheezing, rhonchi or rales.     Comments: Distant breath sounds Chest:     Chest wall: No tenderness.  Abdominal:     General: Bowel sounds are normal. There is no distension.     Palpations: Abdomen is soft. Abdomen is not rigid.     Tenderness: There is no  abdominal tenderness. There is no guarding or rebound.     Hernia: A hernia is present.     Comments: Left inguinal hernia persists  Musculoskeletal:        General: Normal range of motion.     Cervical back: Normal range of motion and neck supple. No edema, erythema or rigidity. No muscular tenderness. Normal range of motion.  Lymphadenopathy:     Head:     Right side of head: No submental or submandibular adenopathy.     Left side of head: No submental or submandibular adenopathy.     Cervical: No cervical adenopathy.  Skin:    General: Skin is warm and dry.     Coloration: Skin is not pale.     Findings: No rash.     Nails: There is no clubbing.     Comments: Psoriatic rash over both knees both elbows forehead back of neck  Neurological:     General: No focal deficit present.     Mental Status: He is alert and oriented to person, place, and time.  Sensory: No sensory deficit.  Psychiatric:        Mood and Affect: Mood normal.        Speech: Speech normal.        Behavior: Behavior normal.        Thought Content: Thought content normal.    No exam this is a video visit but the patient is seen on video he is coughing considerable amount and is very dyspneic even at rest but is in no acute distress and he states he is overall better than he was a few days ago BP 104/72 (BP Location: Left Arm, Patient Position: Sitting, Cuff Size: Large)   Pulse 79   Temp 98.2 F (36.8 C)   Ht $R'6\' 1"'KW$  (1.854 m)   Wt 192 lb (87.1 kg)   SpO2 98%   BMI 25.33 kg/m  Wt Readings from Last 3 Encounters:  08/25/21 192 lb (87.1 kg)  06/17/21 191 lb 6.4 oz (86.8 kg)  03/23/21 187 lb 6.4 oz (85 kg)     Health Maintenance Due  Topic Date Due   COVID-19 Vaccine (3 - Pfizer series) 03/25/2020   INFLUENZA VACCINE  08/04/2021    There are no preventive care reminders to display for this patient.  Lab Results  Component Value Date   TSH 1.640 03/23/2021   Lab Results  Component Value Date    WBC 10.8 03/23/2021   HGB 15.5 03/23/2021   HCT 44.9 03/23/2021   MCV 88 03/23/2021   PLT 325 03/23/2021   Lab Results  Component Value Date   NA 141 03/23/2021   K 4.2 03/23/2021   CO2 26 03/23/2021   GLUCOSE 110 (H) 03/23/2021   BUN 16 03/23/2021   CREATININE 1.31 (H) 03/23/2021   BILITOT 0.3 03/23/2021   ALKPHOS 104 03/23/2021   AST 18 03/23/2021   ALT 15 03/23/2021   PROT 7.0 03/23/2021   ALBUMIN 4.4 03/23/2021   CALCIUM 9.6 03/23/2021   ANIONGAP 10 11/30/2020   EGFR 65 03/23/2021   Lab Results  Component Value Date   CHOL 248 (H) 03/23/2021   Lab Results  Component Value Date   HDL 44 03/23/2021   Lab Results  Component Value Date   LDLCALC 179 (H) 03/23/2021   Lab Results  Component Value Date   TRIG 135 03/23/2021   Lab Results  Component Value Date   CHOLHDL 5.6 (H) 03/23/2021   Lab Results  Component Value Date   HGBA1C 5.8 (H) 12/07/2018      Assessment & Plan:   Problem List Items Addressed This Visit       Respiratory   COPD with emphysema (HCC)GOLD D  (Chronic)    Advised to resume Trelegy Patient given a flutter valve to use before each nebulizer treatment        Digestive   Inguinal hernia with obstruction without gangrene    Observing this for now he is not a good candidate for general anesthesia        Musculoskeletal and Integument   Plaque psoriasis    Treatment per dermatology     No orders of the defined types were placed in this encounter.  Return in 4 months  Mariel Sleet

## 2021-08-25 NOTE — Patient Instructions (Signed)
No change in medications please get back on the Trelegy inhaler  Use the flutter valve after each nebulizer treatment  Return to Dr. Delford Field 4 months

## 2021-08-25 NOTE — Assessment & Plan Note (Signed)
Treatment per dermatology

## 2021-08-25 NOTE — Assessment & Plan Note (Signed)
Advised to resume Trelegy Patient given a flutter valve to use before each nebulizer treatment

## 2021-08-26 ENCOUNTER — Other Ambulatory Visit (HOSPITAL_BASED_OUTPATIENT_CLINIC_OR_DEPARTMENT_OTHER): Payer: Self-pay

## 2021-09-04 DIAGNOSIS — J449 Chronic obstructive pulmonary disease, unspecified: Secondary | ICD-10-CM | POA: Diagnosis not present

## 2021-09-09 ENCOUNTER — Encounter: Payer: Self-pay | Admitting: Critical Care Medicine

## 2021-09-10 ENCOUNTER — Telehealth: Payer: Self-pay | Admitting: Critical Care Medicine

## 2021-09-10 NOTE — Telephone Encounter (Signed)
Copied from CRM [id number]. Topic: General - Other >> Sep 10, 2021  1:02 PM Clide Dales wrote: Dr Mickel Crow, Dermatology would like to speak with Dr Delford Field about patients care.Cell [phone] Office [phone].

## 2021-09-11 NOTE — Telephone Encounter (Signed)
Called office but was closed, will route to pcp for review

## 2021-09-14 NOTE — Telephone Encounter (Signed)
I tried to call but no answer can you find out what is needed?

## 2021-09-15 NOTE — Telephone Encounter (Signed)
Called office number and left voicemail

## 2021-09-17 ENCOUNTER — Other Ambulatory Visit (HOSPITAL_BASED_OUTPATIENT_CLINIC_OR_DEPARTMENT_OTHER): Payer: Self-pay

## 2021-10-04 DIAGNOSIS — J449 Chronic obstructive pulmonary disease, unspecified: Secondary | ICD-10-CM | POA: Diagnosis not present

## 2021-11-04 DIAGNOSIS — J449 Chronic obstructive pulmonary disease, unspecified: Secondary | ICD-10-CM | POA: Diagnosis not present

## 2021-11-11 ENCOUNTER — Other Ambulatory Visit: Payer: Self-pay | Admitting: Critical Care Medicine

## 2021-11-11 ENCOUNTER — Other Ambulatory Visit (HOSPITAL_BASED_OUTPATIENT_CLINIC_OR_DEPARTMENT_OTHER): Payer: Self-pay

## 2021-11-11 MED ORDER — ALBUTEROL SULFATE HFA 108 (90 BASE) MCG/ACT IN AERS
2.0000 | INHALATION_SPRAY | Freq: Four times a day (QID) | RESPIRATORY_TRACT | 2 refills | Status: DC | PRN
  Filled 2021-11-11: qty 18, 25d supply, fill #0
  Filled 2022-02-26: qty 18, 25d supply, fill #1

## 2021-12-01 DIAGNOSIS — J432 Centrilobular emphysema: Secondary | ICD-10-CM | POA: Diagnosis not present

## 2021-12-01 DIAGNOSIS — J9811 Atelectasis: Secondary | ICD-10-CM | POA: Diagnosis not present

## 2021-12-01 DIAGNOSIS — J439 Emphysema, unspecified: Secondary | ICD-10-CM | POA: Diagnosis not present

## 2021-12-01 DIAGNOSIS — Z87891 Personal history of nicotine dependence: Secondary | ICD-10-CM | POA: Diagnosis not present

## 2021-12-04 DIAGNOSIS — J449 Chronic obstructive pulmonary disease, unspecified: Secondary | ICD-10-CM | POA: Diagnosis not present

## 2021-12-29 ENCOUNTER — Other Ambulatory Visit: Payer: Self-pay | Admitting: Critical Care Medicine

## 2021-12-29 NOTE — Telephone Encounter (Signed)
Requested medication (s) are due for refill today: yes   Requested medication (s) are on the active medication list: yes   Last refill:  03/23/21 with 4 refills  Future visit scheduled: yes   Notes to clinic:  Please review for refill. Unable to refill per protocol.     Requested Prescriptions  Pending Prescriptions Disp Refills   Fluticasone-Umeclidin-Vilant (TRELEGY ELLIPTA) 200-62.5-25 MCG/ACT AEPB 60 each 4    Sig: Inhale 1 puff into the lungs daily.     Off-Protocol Failed - 12/29/2021  1:28 PM      Failed - Medication not assigned to a protocol, review manually.      Passed - Valid encounter within last 12 months    Recent Outpatient Visits           4 months ago Panlobular emphysema [facility])   [facility] Storm Frisk, MD   6 months ago Pharyngitis, unspecified etiology   [place] [facility] Storm Frisk, MD   7 months ago Panlobular emphysema [facility])   [place] [place] [facility] Storm Frisk, MD   9 months ago Panlobular emphysema [facility])   [facility] [place] [facility] Storm Frisk, MD   11 months ago Panlobular emphysema [facility]   [facility] Storm Frisk, MD       Future Appointments             In 1 week Storm Frisk, MD [facility]

## 2021-12-30 ENCOUNTER — Other Ambulatory Visit (HOSPITAL_BASED_OUTPATIENT_CLINIC_OR_DEPARTMENT_OTHER): Payer: Self-pay

## 2021-12-30 MED ORDER — TRELEGY ELLIPTA 200-62.5-25 MCG/ACT IN AEPB
1.0000 | INHALATION_SPRAY | Freq: Every day | RESPIRATORY_TRACT | 0 refills | Status: DC
  Filled 2021-12-30: qty 60, 30d supply, fill #0

## 2021-12-31 ENCOUNTER — Ambulatory Visit: Payer: Medicaid Other | Admitting: Critical Care Medicine

## 2022-01-04 DIAGNOSIS — J449 Chronic obstructive pulmonary disease, unspecified: Secondary | ICD-10-CM | POA: Diagnosis not present

## 2022-01-07 ENCOUNTER — Telehealth: Payer: Self-pay

## 2022-01-07 ENCOUNTER — Ambulatory Visit: Payer: Medicaid Other | Attending: Critical Care Medicine | Admitting: Critical Care Medicine

## 2022-01-07 ENCOUNTER — Encounter: Payer: Self-pay | Admitting: Critical Care Medicine

## 2022-01-07 ENCOUNTER — Other Ambulatory Visit (HOSPITAL_BASED_OUTPATIENT_CLINIC_OR_DEPARTMENT_OTHER): Payer: Self-pay

## 2022-01-07 VITALS — BP 100/64 | HR 91 | Ht 73.0 in | Wt 185.6 lb

## 2022-01-07 DIAGNOSIS — E785 Hyperlipidemia, unspecified: Secondary | ICD-10-CM | POA: Diagnosis not present

## 2022-01-07 DIAGNOSIS — L400 Psoriasis vulgaris: Secondary | ICD-10-CM

## 2022-01-07 DIAGNOSIS — J431 Panlobular emphysema: Secondary | ICD-10-CM | POA: Diagnosis not present

## 2022-01-07 DIAGNOSIS — K056 Periodontal disease, unspecified: Secondary | ICD-10-CM

## 2022-01-07 DIAGNOSIS — G4733 Obstructive sleep apnea (adult) (pediatric): Secondary | ICD-10-CM

## 2022-01-07 MED ORDER — TRELEGY ELLIPTA 200-62.5-25 MCG/ACT IN AEPB
1.0000 | INHALATION_SPRAY | Freq: Every day | RESPIRATORY_TRACT | 0 refills | Status: DC
  Filled 2022-02-26: qty 60, 30d supply, fill #0

## 2022-01-07 MED ORDER — IPRATROPIUM-ALBUTEROL 0.5-2.5 (3) MG/3ML IN SOLN
3.0000 mL | Freq: Four times a day (QID) | RESPIRATORY_TRACT | 0 refills | Status: DC | PRN
  Filled 2022-01-07: qty 360, 30d supply, fill #0

## 2022-01-07 MED ORDER — PAROXETINE HCL 10 MG PO TABS
10.0000 mg | ORAL_TABLET | Freq: Every morning | ORAL | 0 refills | Status: DC
  Filled 2022-01-07: qty 30, 30d supply, fill #0

## 2022-01-07 MED ORDER — ESOMEPRAZOLE MAGNESIUM 40 MG PO CPDR
40.0000 mg | DELAYED_RELEASE_CAPSULE | Freq: Two times a day (BID) | ORAL | 2 refills | Status: DC
  Filled 2022-01-07: qty 60, 30d supply, fill #0

## 2022-01-07 NOTE — Progress Notes (Addendum)
Established Patient Office Visit  Subjective   Patient ID: Richard Fuentes, male    DOB: 1970-11-14  Age: 52 y.o. MRN: [id number]  Chief Complaint  Patient presents with   COPD    01/07/22 Since last visit patient has been to [facility] they are recommending a lung volume reduction surgery see below [facility] 11/2021 Richard Fuentes [id number] is a 52 y.o. male referred to evaluate for lung volume reduction surgery (LVRS). Patient is a former, 55 pack-year smoker who was diagnosed with COPD/emphysema I 2013. He developed Covid in January 2021, after which his symptoms worsened to the point patient was struggling to perform ADLs and could not ambulate more than 100 feet. Labwork done 2021 at [facility] was negative for alpha-1 anti-trypsin. He consulted with [facility] 02/2020. 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 Transplant evaluation was closed.  Patient underwent endobronchial valve placement on 03/20/2020 with Dr. Fuller Canada, and valve replacement on 01/15/2021 with Dr. Rogue Bussing. CT Chest on 03/10/2021 showed an increase in LUL bandlike atelectasis with volume loss. He returned to see Dr. Ronn Melena on 08/10/2021. Patient reports that, while his dyspnea has slightly improved following valve revision in 01/2021, improvement has not been as notable as he experienced following initial valve placement. He reports dyspnea is problematic when eating. Patient inquired about additional valve revisions; Dr. Ronn Melena reviewed rationale for not revising the valves any further. Dr. Ronn Melena has referred patient to Thoracic Surgeon, Dr. Owens Shark, to evaluate for LVRS.  Patient participated in pulmonary rehabilitation at [facility] in 07/2020, and re-enrolled in pulmonary rehab in 2023. BMI is 25.28. '  Dr. Maryjane Hurter reviewed patient case and indicated patient may be a candidate for unilateral LVRS on the  right. However, have complete pulmonary rehab again, or participating in it.  He presents today with VQ scan and clinic for consult.  Today, pt notes that he is better since starting rehab but still has poor exercise tolerance. He notes that he is seeking treatment of his OCD.  Smoking status: former smoker; quit 2020. Hx 2ppd x 34 years = 68 pack year. Former Psychologist, forensic use; quit 2020. Hx marijuana use; quit 1987.  Anticoagulation/Antiplatelet: none  PMH also includes: obsessive compulsive disorder, anxiety , OSA on CPAP, chronic periodontal disease. He consulted with [facility], Dr. Dr. Marinell Blight, on 05/27/2021. Plan is for full mouth extractions with dentures versus referral to [facility], Dr. Elmyra Ricks Messenger, for comprehensive dental evaluation for possible multiple extractions, scaling and root planing, and multiple restorations. Richard Fuentes is a 52 y.o. male with a history of bullous emphysema. Evaluation today indicates he is an appropriate surgical candidate for unilateral LVRS.  Results of the above studies were discussed with the patient. The nature, purpose, risks, benefits and alternatives regarding Right VATS LVRS were explained, and the patient was given the opportunity to ask questions and have these questions answered to their satisfaction. He offered that we would work with Dr. Jeanella Anton for him to have a full mouth extraction at the time of the LVRS. He wished to consider his options and get back to [place]. Our office contact information was provided to the patient.   Patient also has ongoing psoriasis was supposed to start [facility] but held off.  He needs to have his teeth extracted and a left hernia repaired in the inguinal area.  Asked questions on whether I am  primary and healthy Blue we have documented he is we will get him further resources because he does not seem to be able to sign up for [place].  Blood pressure on arrival good 100/64.  He continues to sleep very  poorly.  The patient had sleep study showing desaturation and OSA he cannot use cpap ,uses Oxygen 2L      Review of Systems  Constitutional:  Negative for chills, diaphoresis, fever, malaise/fatigue and weight loss.  HENT:  Negative for congestion, hearing loss, nosebleeds, sore throat and tinnitus.   Eyes:  Negative for blurred vision, photophobia and redness.  Respiratory:  Positive for cough and shortness of breath. Negative for hemoptysis, sputum production, wheezing and stridor.   Cardiovascular:  Negative for chest pain, palpitations, orthopnea, claudication, leg swelling and PND.  Gastrointestinal:  Negative for abdominal pain, blood in stool, constipation, diarrhea, heartburn, nausea and vomiting.  Genitourinary:  Negative for dysuria, flank pain, frequency, hematuria and urgency.  Musculoskeletal:  Negative for back pain, falls, joint pain, myalgias and neck pain.  Skin:  Positive for rash. Negative for itching.  Neurological:  Positive for weakness. Negative for dizziness, tingling, tremors, sensory change, speech change, focal weakness, seizures, loss of consciousness and headaches.  Endo/Heme/Allergies:  Negative for environmental allergies and polydipsia. Does not bruise/bleed easily.  Psychiatric/Behavioral:  Negative for depression, memory loss, substance abuse and suicidal ideas. The patient is not nervous/anxious and does not have insomnia.       Objective:     BP 100/64   Pulse 91   Ht '6\' 1"'$  (1.854 m)   Wt 185 lb 9.6 oz (84.2 kg)   SpO2 98%   BMI 24.49 kg/m   Pulse ox with exercise Normal and   normal at rest. Overnight sleep Oximetry poor d/t sleep apnea. Cannot use CPAP Physical Exam Vitals reviewed.  Constitutional:      Appearance: Normal appearance. He is well-developed. He is not diaphoretic.  HENT:     Head: Normocephalic and atraumatic.     Nose: No nasal deformity, septal deviation, mucosal edema or rhinorrhea.     Right Sinus: No maxillary sinus  tenderness or frontal sinus tenderness.     Left Sinus: No maxillary sinus tenderness or frontal sinus tenderness.     Mouth/Throat:     Pharynx: No oropharyngeal exudate.  Eyes:     General: No scleral icterus.    Conjunctiva/sclera: Conjunctivae normal.     Pupils: Pupils are equal, round, and reactive to light.  Neck:     Thyroid: No thyromegaly.     Vascular: No carotid bruit or JVD.     Trachea: Trachea normal. No tracheal tenderness or tracheal deviation.  Cardiovascular:     Rate and Rhythm: Normal rate and regular rhythm.     Chest Wall: PMI is not displaced.     Pulses: Normal pulses. No decreased pulses.     Heart sounds: Normal heart sounds, S1 normal and S2 normal. Heart sounds not distant. No murmur heard.    No systolic murmur is present.     No diastolic murmur is present.     No friction rub. No gallop. No S3 or S4 sounds.  Pulmonary:     Effort: Pulmonary effort is normal. No tachypnea, accessory muscle usage or respiratory distress.     Breath sounds: No stridor. No decreased breath sounds, wheezing, rhonchi or rales.     Comments: Distant breath sounds Chest:     Chest wall: No tenderness.  Abdominal:  General: Bowel sounds are normal. There is no distension.     Palpations: Abdomen is soft. Abdomen is not rigid.     Tenderness: There is no abdominal tenderness. There is no guarding or rebound.  Musculoskeletal:        General: Normal range of motion.     Cervical back: Normal range of motion and neck supple. No edema, erythema or rigidity. No muscular tenderness. Normal range of motion.  Lymphadenopathy:     Head:     Right side of head: No submental or submandibular adenopathy.     Left side of head: No submental or submandibular adenopathy.     Cervical: No cervical adenopathy.  Skin:    General: Skin is warm and dry.     Coloration: Skin is not pale.     Findings: Rash present.     Nails: There is no clubbing.     Comments: Psoriatic rash over  elbows and scalp  Neurological:     Mental Status: He is alert and oriented to person, place, and time.     Sensory: No sensory deficit.  Psychiatric:        Speech: Speech normal.        Behavior: Behavior normal.      No results found for any visits on 01/07/22.    The 10-year ASCVD risk score (Arnett DK, et al., 2019) is: 4.6%    Assessment & Plan:   Problem List Items Addressed This Visit       Respiratory   COPD with emphysema (HCC)GOLD D  - Primary (Chronic)    Gold stage D COPD with air trapping advised to consider lung volume reduction but no only after he gets his teeth extracted and advised for this not to occur at the same visit      Relevant Medications   ipratropium-albuterol (DUONEB) 0.5-2.5 (3) MG/3ML SOLN   OSA (obstructive sleep apnea)    Intolerant of CPAP Advised to use nasal oxygen at night        Digestive   Chronic periodontal disease    Recommend total complete tooth extraction        Musculoskeletal and Integument   Plaque psoriasis    Recommend he start [place]ri[place] after he gets the teeth extracted        Other   Hyperlipidemia with target LDL less than 100 (Chronic)    High risk of statin myopathy hold Lipitor      38 minutes spent going over multiple problems and patient education Return in about 4 months (around 05/08/2022) for copd, chronic conditions.    Asencion Noble, MD

## 2022-01-07 NOTE — Assessment & Plan Note (Signed)
Intolerant of CPAP Advised to use nasal oxygen at night

## 2022-01-07 NOTE — Assessment & Plan Note (Signed)
Recommend total complete tooth extraction

## 2022-01-07 NOTE — Assessment & Plan Note (Signed)
Recommend he start [place] after he gets the teeth extracted

## 2022-01-07 NOTE — Progress Notes (Deleted)
Established Patient Office Visit  Subjective:  Patient ID: Richard Fuentes, male    DOB: 1970/09/15  Age: 52 y.o. MRN: [id number]   CC: Primary care follow-up  HPI 01/27/21 Richard Fuentes presents for primary care follow-up history of severe COPD with significant centrilobular emphysema and chronic respiratory failure..  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  Since this procedure was done in the past month he has slowly improved although he had quite a bit of coughing and dyspnea immediately following the procedure.  Follow-up chest x-ray did not show any pneumothorax.  The patient eventually will need to have inguinal hernia repair but needs to have some dental work first.  He needs to recover from the bronchial valve surgery for any of these procedures can occur.  His saturations when he walks 15 feet are more drop in the 80% range.  He is not been using his oxygen during the day with ambulation only at night.  At rest his oxygenation averages 98 to 94%.  He is using his Trelegy daily.  He does have his nebulizer.  He was given cough syrup and he has been coughing quite a bit but is been not wanting to use it.  He is encouraged to use the cough medication given by the [facility] lung physicians.  He is not having significant chest pain at this time.  He states in the last few days he is improved.  X-ray does show gradual deflation of the left upper lobe as intended.  03/23/21 Patient returns for in person visit.  Patient's been to [facility] and has been seen by the pulmonary department and has had an improvement in his lung function after the pulmonary valves were placed his residual volume has gone down to 175% predicted from 211% predicted.  His FEV1 has increased from 29% predicted to 44% predicted.  He still wears oxygen at night.  Coughing spells have improved.  He still has occasional hemoptysis this is due  to the valve repositionings in the left upper lobe.  CT of the chest show progressive loss of volume as expected left upper lobe.  Patient needs lab screenings at this visit.  Patient has appoint with [facility] general surgery March 28 and also with a dental clinic there as well encouraged to keep both of his appointments.  He has a significant left inguinal hernia that needs to be repaired.  Barrier has been not being able to be cleared from a dental perspective and not having to undergo all his pulmonary work.  On arrival blood pressure is good 116/82 Patient still follows with dermatology for his psoriatic condition.  He is only on topical creams at this time.  They are looking to see if he can get molecular biological Medicaid will cover. Patient still with generalized anxiety and has had positive response to counseling with our clinical social  5/16 This patient is seen in short-term follow-up and had some exacerbation of his COPD but it self resolved.  Mucus is minimal and production.  He has bronchial valves in the left upper lobe with lung volume reduction.  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  He is requesting physical therapy.  Dermatology needs to treat psoriasis he did have a negative QuantiFERON gold he will send them the report.  Patient has referral to a  new mental health team at [place] he is making this appointment.  Hernia surgeries been put on hold till he gets further dental work he does have a dental appointment May 21 he is encouraged to keep this appointment.  Patient has no other complaints.  On arrival blood pressure 104/72  8/22 This patient is seen in return follow-up unfortunately it looks like his pulmonary valves in his left upper lobe have moved out of position and he has reinflated his left upper lobe.  He has had more dyspnea with this over the past 2 months.  He had a mild exacerbation 2 days ago.   He is coughing quite a bit minimally productive.  He does not have a flutter valve.  He tried tamsulosin but did not like how it affected his semen production when he had ejaculation.  The patient has not been using his Trelegy this week.  01/07/22 [facility] 11/2021 Richard Fuentes [id number] is a 52 y.o. male referred to evaluate for lung volume reduction surgery (LVRS). Patient is a former, 30 pack-year smoker who was diagnosed with COPD/emphysema I 2013. He developed Covid in January 2021, after which his symptoms worsened to the point patient was struggling to perform ADLs and could not ambulate more than 100 feet. Labwork done 2021 at [facility] was negative for alpha-1 anti-trypsin. He consulted with [facility] I 02/2020. 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 Transplant evaluation was closed.  Patient underwent endobronchial valve placement on 03/20/2020 with Dr. Fuller Canada, and valve replacement on 01/15/2021 with Dr. Rogue Bussing. CT Chest on 03/10/2021 showed an increase in LUL bandlike atelectasis with volume loss. He returned to see Dr. Ronn Melena on 08/10/2021. Patient reports that, while his dyspnea has slightly improved following valve revision in 01/2021, improvement has not been as notable as he experienced following initial valve placement. He reports dyspnea is problematic when eating. Patient inquired about additional valve revisions; Dr. Ronn Melena reviewed rationale for not revising the valves any further. Dr. Ronn Melena has referred patient to Thoracic Surgeon, Dr. Owens Shark, to evaluate for LVRS.  Patient participated in pulmonary rehabilitation at [facility] in 07/2020, and re-enrolled in pulmonary rehab in 2023. BMI is 25.28. '  Dr. Maryjane Hurter reviewed patient case and indicated patient may be a candidate for unilateral LVRS on the right. However, have complete pulmonary rehab again, or  participating in it.  He presents today with VQ scan and clinic for consult.  Today, pt notes that he is better since starting rehab but still has poor exercise tolerance. He notes that he is seeking treatment of his OCD.  Smoking status: former smoker; quit 2020. Hx 2ppd x 34 years = 68 pack year. Former Psychologist, forensic use; quit 2020. Hx marijuana use; quit 1987.  Anticoagulation/Antiplatelet: none  PMH also includes: obsessive compulsive disorder, anxiety , OSA on CPAP, chronic periodontal disease. He consulted with [facility], Dr. Dr. Marinell Blight, on 05/27/2021. Plan is for full mouth extractions with dentures versus referral to [facility], Dr. Elmyra Ricks Messenger, for comprehensive dental evaluation for possible multiple extractions, scaling and root planing, and multiple restorations. Richard Fuentes is a 52 y.o. male with a history of bullous emphysema. Evaluation today indicates he is an appropriate surgical candidate for unilateral LVRS.  Results of the above studies were discussed with the patient. The nature, purpose, risks, benefits and alternatives regarding Right VATS LVRS were explained, and  the patient was given the opportunity to ask questions and have these questions answered to their satisfaction. He offered that we would work with Dr. Jeanella Anton for him to have a full mouth extraction at the time of the LVRS. He wished to consider his options and get back to [place]. Our office contact information was provided to the patient.  I personally reviewed the radiographic studies and evaluated the patient. I discussed the plan above with the patient. Richard Fuentes expressed an understanding of this discussion and recommendations above. All questions were answered to their satisfaction.   Patient Care Team: Elsie Stain, MD as PCP - General (Pulmonary Disease) Elsie Stain, MD as TXP External Referring Provider (Pulmonary Disease)  I spent a total of 60 minutes in both  face-to-face and non-face-to-face activities, excluding procedures performed, for this visit on the date of this encounter. Richard Fuentes is being referred for evaluation of LVRS. He is currently suffering from COPD related symptoms. At today's visit, I personally reviewed all available studies and evaluated the patient. Based on these findings, I have recommended LVRS if he would like.  Side effect is cardiorespiratory Evaluation your review system is the main alcohol and so prescribed and then you work on will look under you have a Biotrol with the rash but hears a sequence you got a get below  He got to get teeth because that is not immunosuppressants can make you have more infection went to get those things done I think you will be fine to get the [facility] for her psoriasis so that you sequence that your sequencing [place] J about this healthy bluegrass already) Past Medical History:  Diagnosis Date   Anxiety    Asthma    COPD (chronic obstructive pulmonary disease) ([place])    Severe centrilobular and paraseptal bullous emphysema; Gold stage D; FEV1 and DLCO<30%.;  Currently undergoing transplant evaluation   Dyspnea    Environmental allergies    Esophagitis    Family history of adverse reaction to anesthesia    Paternal aunt had trouble waking   GERD (gastroesophageal reflux disease)    Headache    Hx of migraines    OCD (obsessive compulsive disorder)    OCD (obsessive compulsive disorder)    Pneumonia    Pneumothorax on left 03/20/2020   Psoriasis    Sleep apnea    Urticaria     Past Surgical History:  Procedure Laterality Date   Endobronchial valve Left 03/20/2020   LUL at [place] Right 2002   arthroscopy and cartilidge removed   KNEE SURGERY Left 1987   Ligaments removed and screw in place    Family History  Problem Relation Age of Onset   Multiple sclerosis Sister    Psoriasis Maternal Grandmother    Heart attack Mother        Late in life   Heart  attack Father 66   CAD Father    Heart attack Paternal Grandfather 11       Sudden cardiac death   Sudden Cardiac Death Paternal Grandfather 55   Heart attack Maternal Uncle    CAD Maternal Uncle    Heart attack Cousin 63   Sudden Cardiac Death Cousin 24   Allergic rhinitis Neg Hx    Angioedema Neg Hx    Asthma Neg Hx    Eczema Neg Hx    Immunodeficiency Neg Hx    Urticaria Neg Hx     Social History  Socioeconomic History   Marital status: Significant Other    Spouse name: Not on file   Number of children: Not on file   Years of education: Not on file   Highest education level: Not on file  Occupational History   Not on file  Tobacco Use   Smoking status: Former    Packs/day: 1.00    Years: 30.00    Total pack years: 30.00    Types: Cigarettes    Quit date: 01/19/2018    Years since quitting: 3.9   Smokeless tobacco: Never  Vaping Use   Vaping Use: Never used  Substance and Sexual Activity   Alcohol use: No   Drug use: No   Sexual activity: Not Currently  Other Topics Concern   Not on file  Social History Narrative   Not on file   Social Determinants of Health   Financial Resource Strain: Not on file  Food Insecurity: Not on file  Transportation Needs: Not on file  Physical Activity: Not on file  Stress: Not on file  Social Connections: Not on file  Intimate Partner Violence: Not on file    Outpatient Medications Prior to Visit  Medication Sig Dispense Refill   albuterol (VENTOLIN HFA) 108 (90 Base) MCG/ACT inhaler Inhale 2 puffs into the lungs every 6 (six) hours as needed for wheezing or shortness of breath. 18 g 2   atorvastatin (LIPITOR) 20 MG tablet Take 1 tablet (20 mg total) by mouth daily. 90 tablet 3   benzonatate (TESSALON) 100 MG capsule Take 1 capsule (100 mg total) by mouth every 8 (eight) hours. 90 capsule 2   calcipotriene (DOVONOX) 0.005 % cream Apply topically 2 (two) times daily. 60 g 0   desonide (DESOWEN) 0.05 % cream Apply 1  application topically 2 (two) times daily as needed (psoriasis).     EPINEPHrine 0.3 mg/0.3 mL IJ SOAJ injection Inject 0.3 mg into the muscle as needed for anaphylaxis. 2 each 1   esomeprazole (NEXIUM) 40 MG capsule TAKE ONE CAPSULE BY MOUTH TWICE A DAY BEFORE A MEAL 60 capsule 2   Fluticasone-Umeclidin-Vilant (TRELEGY ELLIPTA) 200-62.5-25 MCG/ACT AEPB Inhale 1 puff into the lungs daily. 60 each 0   halobetasol (ULTRAVATE) 0.05 % cream Apply topically 2 (two) times daily. (Patient taking differently: Apply 1 application  topically 2 (two) times daily as needed (irritation).) 50 g 0   hydrocortisone 2.5 % cream Apply 1 application topically 2 (two) times daily as needed (psoriasis).     ipratropium-albuterol (DUONEB) 0.5-2.5 (3) MG/3ML SOLN Inhale 3 mLs into the lungs every 6 (six) hours as needed (shortness of breath). 360 mL 0   lidocaine (XYLOCAINE) 2 % solution Gargle with 15 mLs by mouth every 6 hours as needed for mouth or throat pain, spit out after use. (Patient not taking: Reported on 08/25/2021) 100 mL 0   Multiple Vitamin (MULTIVITAMIN WITH MINERALS) TABS tablet Take 1 tablet by mouth daily.     OXYGEN Inhale 2 L into the lungs continuous.     PARoxetine (PAXIL) 10 MG tablet Take one tablet by mouth in the morning. 30 tablet 0   SKYRIZI PEN 150 MG/ML SOAJ Inject into the skin.     traZODone (DESYREL) 50 MG tablet Take 0.5-1 tablets (25-50 mg total) by mouth at bedtime as needed for sleep. 30 tablet 3   No facility-administered medications prior to visit.    Allergies  Allergen Reactions   Other Anaphylaxis    Wool   Shellfish Allergy  Unknown reaction    ROS Review of Systems  Constitutional: Negative.   HENT: Negative.  Negative for ear pain, postnasal drip, rhinorrhea, sinus pressure, sore throat, trouble swallowing and voice change.   Eyes: Negative.   Respiratory:  Positive for cough, shortness of breath and wheezing. Negative for apnea, choking, chest tightness and  stridor.   Cardiovascular: Negative.  Negative for chest pain, palpitations and leg swelling.  Gastrointestinal:  Negative for abdominal distention, abdominal pain, nausea and vomiting.       Left inguinal hernia still persists  Genitourinary: Negative.   Musculoskeletal: Negative.  Negative for arthralgias and myalgias.  Skin: Negative.  Negative for rash.  Allergic/Immunologic: Negative.  Negative for environmental allergies and food allergies.  Neurological: Negative.  Negative for dizziness, syncope, weakness and headaches.  Hematological: Negative.  Negative for adenopathy. Does not bruise/bleed easily.  Psychiatric/Behavioral: Negative.  Negative for agitation and sleep disturbance. The patient is not nervous/anxious.       Objective:    Physical Exam Vitals reviewed.  Constitutional:      Appearance: Normal appearance. He is well-developed. He is not diaphoretic.  HENT:     Head: Normocephalic and atraumatic.     Nose: Nose normal. No nasal deformity, septal deviation, mucosal edema or rhinorrhea.     Right Sinus: No maxillary sinus tenderness or frontal sinus tenderness.     Left Sinus: No maxillary sinus tenderness or frontal sinus tenderness.     Mouth/Throat:     Mouth: Mucous membranes are moist.     Pharynx: Oropharynx is clear. No oropharyngeal exudate.     Comments: Poor dentition left first molar eroded with caries Eyes:     General: No scleral icterus.    Conjunctiva/sclera: Conjunctivae normal.     Pupils: Pupils are equal, round, and reactive to light.  Neck:     Thyroid: No thyromegaly.     Vascular: No carotid bruit or JVD.     Trachea: Trachea normal. No tracheal tenderness or tracheal deviation.  Cardiovascular:     Rate and Rhythm: Normal rate and regular rhythm.     Chest Wall: PMI is not displaced.     Pulses: Normal pulses. No decreased pulses.     Heart sounds: Normal heart sounds, S1 normal and S2 normal. Heart sounds not distant. No murmur  heard.    No systolic murmur is present.     No diastolic murmur is present.     No friction rub. No gallop. No S3 or S4 sounds.  Pulmonary:     Effort: No tachypnea, accessory muscle usage or respiratory distress.     Breath sounds: No stridor. No decreased breath sounds, wheezing, rhonchi or rales.     Comments: Distant breath sounds Chest:     Chest wall: No tenderness.  Abdominal:     General: Bowel sounds are normal. There is no distension.     Palpations: Abdomen is soft. Abdomen is not rigid.     Tenderness: There is no abdominal tenderness. There is no guarding or rebound.     Hernia: A hernia is present.     Comments: Left inguinal hernia persists  Musculoskeletal:        General: Normal range of motion.     Cervical back: Normal range of motion and neck supple. No edema, erythema or rigidity. No muscular tenderness. Normal range of motion.  Lymphadenopathy:     Head:     Right side of head: No submental or submandibular adenopathy.  Left side of head: No submental or submandibular adenopathy.     Cervical: No cervical adenopathy.  Skin:    General: Skin is warm and dry.     Coloration: Skin is not pale.     Findings: No rash.     Nails: There is no clubbing.     Comments: Psoriatic rash over both knees both elbows forehead back of neck  Neurological:     General: No focal deficit present.     Mental Status: He is alert and oriented to person, place, and time.     Sensory: No sensory deficit.  Psychiatric:        Mood and Affect: Mood normal.        Speech: Speech normal.        Behavior: Behavior normal.        Thought Content: Thought content normal.   No exam this is a video visit but the patient is seen on video he is coughing considerable amount and is very dyspneic even at rest but is in no acute distress and he states he is overall better than he was a few days ago There were no vitals taken for this visit. Wt Readings from Last 3 Encounters:  08/25/21  192 lb (87.1 kg)  06/17/21 191 lb 6.4 oz (86.8 kg)  03/23/21 187 lb 6.4 oz (85 kg)     Health Maintenance Due  Topic Date Due   DTaP/Tdap/Td (1 - Tdap) Never done   Lung Cancer Screening  04/23/2020   INFLUENZA VACCINE  Never done   COVID-19 Vaccine (3 - 2023-24 season) 09/04/2021    There are no preventive care reminders to display for this patient.  Lab Results  Component Value Date   TSH 1.640 03/23/2021   Lab Results  Component Value Date   WBC 10.8 03/23/2021   HGB 15.5 03/23/2021   HCT 44.9 03/23/2021   MCV 88 03/23/2021   PLT 325 03/23/2021   Lab Results  Component Value Date   NA 141 03/23/2021   K 4.2 03/23/2021   CO2 26 03/23/2021   GLUCOSE 110 (H) 03/23/2021   BUN 16 03/23/2021   CREATININE 1.31 (H) 03/23/2021   BILITOT 0.3 03/23/2021   ALKPHOS 104 03/23/2021   AST 18 03/23/2021   ALT 15 03/23/2021   PROT 7.0 03/23/2021   ALBUMIN 4.4 03/23/2021   CALCIUM 9.6 03/23/2021   ANIONGAP 10 11/30/2020   EGFR 65 03/23/2021   Lab Results  Component Value Date   CHOL 248 (H) 03/23/2021   Lab Results  Component Value Date   HDL 44 03/23/2021   Lab Results  Component Value Date   LDLCALC 179 (H) 03/23/2021   Lab Results  Component Value Date   TRIG 135 03/23/2021   Lab Results  Component Value Date   CHOLHDL 5.6 (H) 03/23/2021   Lab Results  Component Value Date   HGBA1C 5.8 (H) 12/07/2018      Assessment & Plan:   Problem List Items Addressed This Visit   None No orders of the defined types were placed in this encounter.  Return in 4 months  Mariel Sleet

## 2022-01-07 NOTE — Patient Instructions (Signed)
I highly recommend you get your teeth extracted first as a separate procedure  I highly recommend you give consideration to the lung volume reduction surgery  Once these 2 procedures are done then I recommend starting the [facility]  After you start the [facility] then consider seeing the surgeon for your hernia  Refills on medications sent to pharmacy  No labs are needed at this visit  Our clinical case manager visited with you at this office visit to discuss the fact that Dr. Joya Gaskins is in your network for your Medicaid healthy blue  Return to Dr. Joya Gaskins 4 months

## 2022-01-07 NOTE — Assessment & Plan Note (Signed)
Gold stage D COPD with air trapping advised to consider lung volume reduction but no only after he gets his teeth extracted and advised for this not to occur at the same visit

## 2022-01-07 NOTE — Telephone Encounter (Signed)
I met with the patient when he was in the clinic today. He explained that he received his [employer] card and the PCP is listed as [facility].  He tried on line to change his PCP with no success and he called [employer] and they were not able to change the PCP to Dr Joya Gaskins.  Dr Joya Gaskins is listed as an in network provider but when I tried to change the PCP on patient's account, it would not accept Dr Joya Gaskins. I told the patient that I would need to contact someone within [facility] and / or [employer].

## 2022-01-07 NOTE — Assessment & Plan Note (Signed)
High risk of statin myopathy hold Lipitor

## 2022-01-08 NOTE — Telephone Encounter (Signed)
Per Writer, Managed Medicaid:   We have had this issue with other patients, and this has been escalated to the [facility].

## 2022-01-08 NOTE — Telephone Encounter (Signed)
I sent an email to [facility], Managed Medicaid requesting assistance with changing patient's PCP to Dr Joya Gaskins.

## 2022-01-15 ENCOUNTER — Other Ambulatory Visit: Payer: Self-pay

## 2022-01-15 ENCOUNTER — Encounter (HOSPITAL_BASED_OUTPATIENT_CLINIC_OR_DEPARTMENT_OTHER): Payer: Medicaid Other | Admitting: Critical Care Medicine

## 2022-01-15 DIAGNOSIS — J0100 Acute maxillary sinusitis, unspecified: Secondary | ICD-10-CM

## 2022-01-15 MED ORDER — AMOXICILLIN-POT CLAVULANATE 875-125 MG PO TABS
1.0000 | ORAL_TABLET | Freq: Two times a day (BID) | ORAL | 0 refills | Status: DC
  Filled 2022-01-15: qty 20, 10d supply, fill #0

## 2022-01-15 NOTE — Telephone Encounter (Signed)

## 2022-01-17 ENCOUNTER — Other Ambulatory Visit: Payer: Self-pay

## 2022-01-17 ENCOUNTER — Telehealth: Payer: Medicaid Other | Admitting: Family Medicine

## 2022-01-17 ENCOUNTER — Telehealth: Payer: Medicaid Other

## 2022-01-17 ENCOUNTER — Emergency Department (HOSPITAL_BASED_OUTPATIENT_CLINIC_OR_DEPARTMENT_OTHER)
Admission: EM | Admit: 2022-01-17 | Discharge: 2022-01-17 | Disposition: A | Discharge status: Home / Self Care | Payer: Medicaid Other | Attending: Emergency Medicine | Admitting: Emergency Medicine

## 2022-01-17 ENCOUNTER — Encounter (HOSPITAL_BASED_OUTPATIENT_CLINIC_OR_DEPARTMENT_OTHER): Payer: Self-pay | Admitting: Emergency Medicine

## 2022-01-17 DIAGNOSIS — K047 Periapical abscess without sinus: Secondary | ICD-10-CM | POA: Diagnosis not present

## 2022-01-17 DIAGNOSIS — R220 Localized swelling, mass and lump, head: Secondary | ICD-10-CM | POA: Diagnosis present

## 2022-01-17 MED ORDER — CLINDAMYCIN HCL 150 MG PO CAPS: 300.0000 mg | ORAL_CAPSULE | Freq: Four times a day (QID) | ORAL | 0 refills | Status: DC

## 2022-01-17 NOTE — ED Provider Notes (Signed)
[facility] EMERGENCY DEPT  Provider Note  CSN: [id number] Arrival date & time: 01/17/22 0007  History Chief Complaint  Patient presents with   Facial Swelling    Richard Fuentes is a 52 y.o. male with history of chronic lung disease and poor dentition reports several days of pain and swelling in his R cheek from one of his teeth. Reports it opened and drained pus while in the waiting room. Denies fever.    Home Medications Prior to Admission medications   Medication Sig Start Date End Date Taking? Authorizing Provider  albuterol (VENTOLIN HFA) 108 (90 Base) MCG/ACT inhaler Inhale 2 puffs into the lungs every 6 (six) hours as needed for wheezing or shortness of breath. 11/11/21   Elsie Stain, MD  benzonatate (TESSALON) 100 MG capsule Take 1 capsule (100 mg total) by mouth every 8 (eight) hours. 01/27/21 01/27/22  Elsie Stain, MD  calcipotriene (DOVONOX) 0.005 % cream Apply topically 2 (two) times daily. 03/10/21   Elsie Stain, MD  clindamycin (CLEOCIN) 150 MG capsule Take 2 capsules (300 mg total) by mouth 4 (four) times daily. 01/17/22  Yes [name], MD  desonide (DESOWEN) 0.05 % cream Apply 1 application topically 2 (two) times daily as needed (psoriasis).    [provider]  EPINEPHrine 0.3 mg/0.3 mL IJ SOAJ injection Inject 0.3 mg into the muscle as needed for anaphylaxis. 06/04/21   Elsie Stain, MD  esomeprazole (NEXIUM) 40 MG capsule Take 1 capsule (40 mg total) by mouth 2 (two) times daily before a meal. 01/07/22   Elsie Stain, MD  Fluticasone-Umeclidin-Vilant (TRELEGY ELLIPTA) 200-62.5-25 MCG/ACT AEPB Inhale 1 puff into the lungs daily. 12/30/21   Elsie Stain, MD  Fluticasone-Umeclidin-Vilant (TRELEGY ELLIPTA) 200-62.5-25 MCG/ACT AEPB Inhale 1 puff into the lungs daily. 01/07/22   Elsie Stain, MD  halobetasol (ULTRAVATE) 0.05 % cream Apply topically 2 (two) times daily. Patient taking differently: Apply 1 application   topically 2 (two) times daily as needed (irritation). 11/20/20   Elsie Stain, MD  hydrocortisone 2.5 % cream Apply 1 application topically 2 (two) times daily as needed (psoriasis). 05/20/20   [provider]  ipratropium-albuterol (DUONEB) 0.5-2.5 (3) MG/3ML SOLN Inhale 3 mLs into the lungs every 6 (six) hours as needed (shortness of breath). 01/07/22   Elsie Stain, MD  lidocaine (XYLOCAINE) 2 % solution Gargle with 15 mLs by mouth every 6 hours as needed for mouth or throat pain, spit out after use. Patient not taking: Reported on 08/25/2021 06/17/21   Elsie Stain, MD  Multiple Vitamin (MULTIVITAMIN WITH MINERALS) TABS tablet Take 1 tablet by mouth daily.    [provider]  OXYGEN Inhale 2 L into the lungs continuous.    [provider]  PARoxetine (PAXIL) 10 MG tablet Take one tablet by mouth in the morning. 01/07/22   Elsie Stain, MD  SKYRIZI PEN 150 MG/ML SOAJ Inject into the skin. 06/08/21   [provider]  clonazePAM (KLONOPIN) 0.5 MG tablet Take 1 tablet (0.5 mg total) by mouth 2 (two) times daily as needed for anxiety. 08/13/12 07/10/20  Moreno-Coll, Adlih, MD     Allergies    Other and Shellfish allergy   Review of Systems   Review of Systems Please see HPI for pertinent positives and negatives  Physical Exam BP 117/73 (BP Location: Right Arm)   Pulse 86   Temp 99.1 F (37.3 C) (Oral)   Resp 18  Ht 6\' 1"  (1.854 m)   Wt 83.9 kg   SpO2 100%   BMI 24.41 kg/m   Physical Exam Vitals and nursing note reviewed.  HENT:     Head: Normocephalic.     Nose: Nose normal.     Mouth/Throat:     Comments: Widespread dental decay, there is a spontaneously drained abscess superior to the R upper canine, soft tissue tenderness Eyes:     Extraocular Movements: Extraocular movements intact.  Pulmonary:     Effort: Pulmonary effort is normal.  Musculoskeletal:        General: Normal range of motion.     Cervical back: Neck supple.   Skin:    Findings: No rash (on exposed skin).  Neurological:     Mental Status: He is alert and oriented to person, place, and time.  Psychiatric:        Mood and Affect: Mood normal.     ED Results / Procedures / Treatments   EKG None  Procedures Procedures  Medications Ordered in the ED Medications - No data to display  Initial Impression and Plan  Patient here with odontogenic abscess, spontaneously draining. Has PCN allergy. Plan Rx for clindamycin. Offered a dose here but he declines and will get Rx and take meds at home. He has a complex dental clinic at [facility] where he also sees cardiothoracic surgery regarding his lung disease. He has been told he needs to have all of his teeth removed.   ED Course       MDM Rules/Calculators/A&P Medical Decision Making Problems Addressed: Dental abscess: acute illness or injury  Risk Prescription drug management.    Final Clinical Impression(s) / ED Diagnoses Final diagnoses:  Dental abscess    Rx / DC Orders ED Discharge Orders          Ordered    clindamycin (CLEOCIN) 150 MG capsule  4 times daily        01/17/22 0438             [name], MD 01/17/22 [phone]

## 2022-01-17 NOTE — Progress Notes (Signed)
Pt reports he had to fo to ED during the night and will not need treatment tonight as he was treated there. DWB

## 2022-01-17 NOTE — ED Triage Notes (Signed)
  Patient comes in with R sided facial swelling at his cheek.  Patient states he has very poor dental hygiene and has several broken/cracked teeth.  Concern for possible infection/abscess.  Patient has tried OTC medications but does not help.  No fevers at home.  Pain 8/10, throbbing/pressure.

## 2022-01-18 ENCOUNTER — Other Ambulatory Visit: Payer: Self-pay

## 2022-01-26 DIAGNOSIS — H5213 Myopia, bilateral: Secondary | ICD-10-CM | POA: Diagnosis not present

## 2022-01-26 DIAGNOSIS — H524 Presbyopia: Secondary | ICD-10-CM | POA: Diagnosis not present

## 2022-02-04 DIAGNOSIS — J449 Chronic obstructive pulmonary disease, unspecified: Secondary | ICD-10-CM | POA: Diagnosis not present

## 2022-02-09 ENCOUNTER — Emergency Department (HOSPITAL_BASED_OUTPATIENT_CLINIC_OR_DEPARTMENT_OTHER)
Admission: EM | Admit: 2022-02-09 | Discharge: 2022-02-09 | Disposition: A | Discharge status: Home / Self Care | Payer: Medicaid Other | Attending: Emergency Medicine | Admitting: Emergency Medicine

## 2022-02-09 ENCOUNTER — Encounter (HOSPITAL_BASED_OUTPATIENT_CLINIC_OR_DEPARTMENT_OTHER): Payer: Self-pay

## 2022-02-09 ENCOUNTER — Other Ambulatory Visit: Payer: Self-pay

## 2022-02-09 ENCOUNTER — Other Ambulatory Visit (HOSPITAL_BASED_OUTPATIENT_CLINIC_OR_DEPARTMENT_OTHER): Payer: Self-pay

## 2022-02-09 ENCOUNTER — Emergency Department (HOSPITAL_BASED_OUTPATIENT_CLINIC_OR_DEPARTMENT_OTHER): Payer: Medicaid Other

## 2022-02-09 DIAGNOSIS — J439 Emphysema, unspecified: Secondary | ICD-10-CM | POA: Diagnosis not present

## 2022-02-09 DIAGNOSIS — D72829 Elevated white blood cell count, unspecified: Secondary | ICD-10-CM | POA: Diagnosis not present

## 2022-02-09 DIAGNOSIS — R0602 Shortness of breath: Secondary | ICD-10-CM | POA: Diagnosis not present

## 2022-02-09 DIAGNOSIS — Z7951 Long term (current) use of inhaled steroids: Secondary | ICD-10-CM | POA: Insufficient documentation

## 2022-02-09 DIAGNOSIS — J441 Chronic obstructive pulmonary disease with (acute) exacerbation: Secondary | ICD-10-CM | POA: Diagnosis not present

## 2022-02-09 LAB — BASIC METABOLIC PANEL
Anion gap: 9 (ref 5–15)
BUN: 16 mg/dL (ref 6–20)
CO2: 27 mmol/L (ref 22–32)
Calcium: 9.7 mg/dL (ref 8.9–10.3)
Chloride: 101 mmol/L (ref 98–111)
Creatinine, Ser: 1.23 mg/dL (ref 0.61–1.24)
GFR, Estimated: >60 mL/min (ref >60)
Glucose, Bld: 107 mg/dL — ABNORMAL HIGH (ref 70–99)
Potassium: 3.8 mmol/L (ref 3.5–5.1)
Sodium: 137 mmol/L (ref 135–145)

## 2022-02-09 LAB — CBC WITH DIFFERENTIAL/PLATELET
Abs Immature Granulocytes: 0.03 10*3/uL (ref 0.00–0.07)
Basophils Absolute: 0.1 10*3/uL (ref 0.0–0.1)
Basophils Relative: 1 %
Eosinophils Absolute: 0.1 10*3/uL (ref 0.0–0.5)
Eosinophils Relative: 1 %
HCT: 44.8 % (ref 39.0–52.0)
Hemoglobin: 15.2 g/dL (ref 13.0–17.0)
Immature Granulocytes: 0 %
Lymphocytes Relative: 19 %
Lymphs Abs: 2 10*3/uL (ref 0.7–4.0)
MCH: 30.7 pg (ref 26.0–34.0)
MCHC: 33.9 g/dL (ref 30.0–36.0)
MCV: 90.5 fL (ref 80.0–100.0)
Monocytes Absolute: 0.8 10*3/uL (ref 0.1–1.0)
Monocytes Relative: 7 %
Neutro Abs: 7.8 10*3/uL — ABNORMAL HIGH (ref 1.7–7.7)
Neutrophils Relative %: 72 %
Platelets: 311 10*3/uL (ref 150–400)
RBC: 4.95 MIL/uL (ref 4.22–5.81)
RDW: 11.8 % (ref 11.5–15.5)
WBC: 10.8 10*3/uL — ABNORMAL HIGH (ref 4.0–10.5)
nRBC: 0 % (ref 0.0–0.2)

## 2022-02-09 LAB — TROPONIN I (HIGH SENSITIVITY): Troponin I (High Sensitivity): 3 ng/L (ref <18)

## 2022-02-09 MED ORDER — IPRATROPIUM-ALBUTEROL 0.5-2.5 (3) MG/3ML IN SOLN: 3.0000 mL | Freq: Once | RESPIRATORY_TRACT | Status: AC

## 2022-02-09 MED ORDER — PREDNISONE 50 MG PO TABS
60.0000 mg | ORAL_TABLET | Freq: Once | ORAL | Status: AC
  Administered 2022-02-09: 60 mg via ORAL
  Filled 2022-02-09: qty 1

## 2022-02-09 MED ORDER — PREDNISONE 20 MG PO TABS
60.0000 mg | ORAL_TABLET | Freq: Every day | ORAL | 0 refills | Status: AC
  Filled 2022-02-09 – 2022-02-10 (×2): qty 15, 5d supply, fill #0

## 2022-02-09 MED ORDER — MAGNESIUM SULFATE 2 GM/50ML IV SOLN: 2.0000 g | Freq: Once | INTRAVENOUS | Status: DC

## 2022-02-09 MED ORDER — IPRATROPIUM-ALBUTEROL 0.5-2.5 (3) MG/3ML IN SOLN
RESPIRATORY_TRACT | Status: AC
  Administered 2022-02-09: 3 mL via RESPIRATORY_TRACT
  Filled 2022-02-09: qty 3

## 2022-02-09 NOTE — ED Provider Notes (Signed)
[facility] Provider Note   CSN: [id number] Arrival date & time: 02/09/22  1727     History  Chief Complaint  Patient presents with   Shortness of Breath    Richard Fuentes is a 52 y.o. male.  52 year old male with past medical history significant for COPD who presents today for evaluation of shortness of breath that started earlier today.  He states he has done multiple rounds of nebulizer treatments, rescue inhaler without significant improvement.  He also has chest pain which she states occurs every time when he gets a COPD exacerbation.  He does appear to have significant conversational dyspnea.  No current wheezing but states he had wheezing earlier.  The history is provided by the patient. No language interpreter was used.       Home Medications Prior to Admission medications   Medication Sig Start Date End Date Taking? Authorizing Provider  albuterol (VENTOLIN HFA) 108 (90 Base) MCG/ACT inhaler Inhale 2 puffs into the lungs every 6 (six) hours as needed for wheezing or shortness of breath. 11/11/21   Elsie Stain, MD  calcipotriene (DOVONOX) 0.005 % cream Apply topically 2 (two) times daily. 03/10/21   Elsie Stain, MD  clindamycin (CLEOCIN) 150 MG capsule Take 2 capsules (300 mg total) by mouth 4 (four) times daily. 01/17/22   [name], MD  desonide (DESOWEN) 0.05 % cream Apply 1 application topically 2 (two) times daily as needed (psoriasis).    [provider]  EPINEPHrine 0.3 mg/0.3 mL IJ SOAJ injection Inject 0.3 mg into the muscle as needed for anaphylaxis. 06/04/21   Elsie Stain, MD  esomeprazole (NEXIUM) 40 MG capsule Take 1 capsule (40 mg total) by mouth 2 (two) times daily before a meal. 01/07/22   Elsie Stain, MD  Fluticasone-Umeclidin-Vilant (TRELEGY ELLIPTA) 200-62.5-25 MCG/ACT AEPB Inhale 1 puff into the lungs daily. 12/30/21   Elsie Stain, MD  Fluticasone-Umeclidin-Vilant (TRELEGY  ELLIPTA) 200-62.5-25 MCG/ACT AEPB Inhale 1 puff into the lungs daily. 01/07/22   Elsie Stain, MD  halobetasol (ULTRAVATE) 0.05 % cream Apply topically 2 (two) times daily. Patient taking differently: Apply 1 application  topically 2 (two) times daily as needed (irritation). 11/20/20   Elsie Stain, MD  hydrocortisone 2.5 % cream Apply 1 application topically 2 (two) times daily as needed (psoriasis). 05/20/20   [provider]  ipratropium-albuterol (DUONEB) 0.5-2.5 (3) MG/3ML SOLN Inhale 3 mLs into the lungs every 6 (six) hours as needed (shortness of breath). 01/07/22   Elsie Stain, MD  lidocaine (XYLOCAINE) 2 % solution Gargle with 15 mLs by mouth every 6 hours as needed for mouth or throat pain, spit out after use. Patient not taking: Reported on 08/25/2021 06/17/21   Elsie Stain, MD  Multiple Vitamin (MULTIVITAMIN WITH MINERALS) TABS tablet Take 1 tablet by mouth daily.    [provider]  OXYGEN Inhale 2 L into the lungs continuous.    [provider]  PARoxetine (PAXIL) 10 MG tablet Take one tablet by mouth in the morning. 01/07/22   Elsie Stain, MD  SKYRIZI PEN 150 MG/ML SOAJ Inject into the skin. 06/08/21   [provider]  clonazePAM (KLONOPIN) 0.5 MG tablet Take 1 tablet (0.5 mg total) by mouth 2 (two) times daily as needed for anxiety. 08/13/12 07/10/20  Moreno-Coll, Adlih, MD      Allergies    Other and Shellfish allergy    Review of Systems  Review of Systems  Constitutional:  Negative for chills and fever.  Respiratory:  Positive for cough and shortness of breath.   Cardiovascular:  Positive for chest pain. Negative for palpitations and leg swelling.  Gastrointestinal:  Negative for abdominal pain and nausea.  Neurological:  Negative for light-headedness.  All other systems reviewed and are negative.   Physical Exam Updated Vital Signs BP 92/69 (BP Location: Right Arm)   Pulse 89   Temp 98.2 F (36.8 C) (Oral)   Resp  (!) 22   Ht 6\' 1"  (1.854 m)   Wt 83.9 kg   SpO2 100%   BMI 24.40 kg/m  Physical Exam Vitals and nursing note reviewed.  Constitutional:      General: He is in acute distress.     Appearance: Normal appearance. He is not ill-appearing.  HENT:     Head: Normocephalic and atraumatic.     Nose: Nose normal.  Eyes:     General: No scleral icterus.    Extraocular Movements: Extraocular movements intact.     Conjunctiva/sclera: Conjunctivae normal.  Cardiovascular:     Rate and Rhythm: Normal rate and regular rhythm.     Pulses: Normal pulses.  Pulmonary:     Effort: Pulmonary effort is normal. No respiratory distress.     Breath sounds: Normal breath sounds. No wheezing.  Abdominal:     General: There is no distension.     Tenderness: There is no abdominal tenderness.  Musculoskeletal:        General: Normal range of motion.     Cervical back: Normal range of motion.     Right lower leg: No edema.     Left lower leg: No edema.  Skin:    General: Skin is warm and dry.  Neurological:     General: No focal deficit present.     Mental Status: He is alert. Mental status is at baseline.     ED Results / Procedures / Treatments   Labs (all labs ordered are listed, but only abnormal results are displayed) Labs Reviewed  BASIC METABOLIC PANEL  CBC  CBC WITH DIFFERENTIAL/PLATELET  TROPONIN I (HIGH SENSITIVITY)    Radiology No results found.  Procedures Procedures    Medications Ordered in ED Medications  ipratropium-albuterol (DUONEB) 0.5-2.5 (3) MG/3ML nebulizer solution 3 mL (has no administration in time range)  ipratropium-albuterol (DUONEB) 0.5-2.5 (3) MG/3ML nebulizer solution (has no administration in time range)  predniSONE (DELTASONE) tablet 60 mg (has no administration in time range)  magnesium sulfate IVPB 2 g 50 mL (has no administration in time range)    ED Course/ Medical Decision Making/ A&P                             Medical Decision  Making Amount and/or Complexity of Data Reviewed Labs: ordered. Radiology: ordered.  Risk Prescription drug management.   Medical Decision Making / ED Course   This patient presents to the ED for concern of shortness of breath, this involves an extensive number of treatment options, and is a complaint that carries with it a high risk of complications and morbidity.  The differential diagnosis includes ACS, COPD exacerbation, pneumonia, PE, viral URI  MDM: 52 year old male presents for shortness of breath.  Appears to be in moderate acute distress from respiratory standpoint.  Significant conversational dyspnea.  No wheezing on exam.  Will provide DuoNeb, magnesium infusion, and steroids.  Patient states he would prefer  to take p.o. prednisone as opposed to dexamethasone or Solu-Medrol IV.  Will add on troponin given the chest pain.  Patient states this episode is similar to his prior COPD exacerbation and always has a chest discomfort along with it.  No prior history of blood clots. CBC shows mild leukocytosis but no left shift.  No anemia.  BMP [place] with glucose 107 otherwise unremarkable.  Troponin of 3.  Chest x-ray without acute cardiopulmonary process.  EKG without acute ischemic changes.  Patient refused magnesium stating given his underlying OCD he is very particular about his medications and does not want to take the magnesium, also this is the reason why he prefers the p.o. prednisone as opposed to IV Solu-Medrol.  Patient reports significant improvement on reevaluation.  Discussed given his initial presentation we can get him admitted for observation however he defers and states he will return if he has any worsening symptoms.  He states he feels significantly better.  Discussed that we will discharge him with COPD exacerbation treatment with antibiotics and p.o. prednisone.  He states he does not want the antibiotics and every time he is prescribed these he does not take him.  He states he  was turned down for transplant because he was selectively noncompliant with his medications.  He understands that this does negatively impact his health however he does not want the antibiotics.  Will discharge with prednisone.  Strict return precautions given.  Patient is discharged in stable condition.  Patient voices understanding and is in agreement with plan.   Lab Tests: -I ordered, reviewed, and interpreted labs.   The pertinent results include:   Labs Reviewed  BASIC METABOLIC PANEL  CBC  CBC WITH DIFFERENTIAL/PLATELET  TROPONIN I (HIGH SENSITIVITY)      Imaging Studies ordered: I ordered imaging studies including chest x-ray I independently visualized and interpreted imaging. I agree with the radiologist interpretation   Medicines ordered and prescription drug management: Meds ordered this encounter  Medications   ipratropium-albuterol (DUONEB) 0.5-2.5 (3) MG/3ML nebulizer solution 3 mL   ipratropium-albuterol (DUONEB) 0.5-2.5 (3) MG/3ML nebulizer solution    [place] Fam M: cabinet override   predniSONE (DELTASONE) tablet 60 mg   magnesium sulfate IVPB 2 g 50 mL    -I have reviewed the patients home medicines and have made adjustments as needed  Critical interventions DuoNeb   Cardiac Monitoring: The patient was maintained on a cardiac monitor.  I personally viewed and interpreted the cardiac monitored which showed an underlying rhythm of: Normal sinus rhythm   Reevaluation: After the interventions noted above, I reevaluated the patient and found that they have :improved  Co morbidities that complicate the patient evaluation  Past Medical History:  Diagnosis Date   Anxiety    Asthma    COPD (chronic obstructive pulmonary disease) (HCC)    Severe centrilobular and paraseptal bullous emphysema; Gold stage D; FEV1 and DLCO<30%.;  Currently undergoing transplant evaluation   Dyspnea    Environmental allergies    Esophagitis    Family history of adverse  reaction to anesthesia    Paternal aunt had trouble waking   GERD (gastroesophageal reflux disease)    Headache    Hx of migraines    OCD (obsessive compulsive disorder)    OCD (obsessive compulsive disorder)    Pneumonia    Pneumothorax on left 03/20/2020   Psoriasis    Sleep apnea    Urticaria       Dispostion: Patient discharged in stable condition.  Admission offered however patient defers.  Strict return precautions discussed.  Final Clinical Impression(s) / ED Diagnoses Final diagnoses:  COPD exacerbation (HCC)    Rx / DC Orders ED Discharge Orders          Ordered    predniSONE (DELTASONE) 20 MG tablet  Daily with breakfast        02/09/22 1944              Marita Kansas, PA-C 02/09/22 1944    Ernie Avena, MD 02/09/22 2042

## 2022-02-09 NOTE — ED Triage Notes (Signed)
Patient here POV from Home.  Endorses SOB for a few hours now. Some Relief with 1 Breathing Treatment. Some Chest Pain as well.   History of COPD.  SOB During triage. A&Ox4. Gcs 15. BIB Wheelchair.Marland Kitchen

## 2022-02-09 NOTE — ED Notes (Signed)
Pt refuses magnesium. States he is OCD about meds and just can't take it. Pt understands this may negatively impact his recovery, but is unwilling to take it.

## 2022-02-09 NOTE — ED Notes (Signed)
Pt discharged home after verbalizing understanding of discharge instructions; nad noted. 

## 2022-02-09 NOTE — Discharge Instructions (Signed)
Your workup today was overall reassuring.  We discussed admission into the hospital however you did not want to pursue this at this time.  You state you will return if you have worsening symptoms.  You state you feel a lot better compared to when you came in.  Have sent prednisone into our pharmacy.  You had a dose of the prednisone today so your next dose will be due tomorrow.  We discussed antibiotics and addition of prednisone however you state you do not want these then probably will not take them if these are prescribed.  For any concerning symptoms return to the emergency department.  Otherwise follow-up with your primary care doctor.

## 2022-02-10 ENCOUNTER — Telehealth: Payer: Self-pay | Admitting: Critical Care Medicine

## 2022-02-10 ENCOUNTER — Other Ambulatory Visit (HOSPITAL_BASED_OUTPATIENT_CLINIC_OR_DEPARTMENT_OTHER): Payer: Self-pay

## 2022-02-10 NOTE — Telephone Encounter (Signed)
I reached out to the patient to contact him regarding his ER visit yesterday.  Prior to this the patient had been seen for a dental abscess in January and recommended antibiotic therapy this was after a prior visit where antibiotic therapy was also recommended.  The patient's dental abscess drained on its own he felt he did not need this.  He then comes in for a COPD exacerbation yesterday given pulsed prednisone and recommended antibiotics he deferred he did receive a prednisone prescription today he tells me he will go pick this up.  I informed the patient that dental abscesses can cause exacerbations of COPD and antibiotics are indicated also ask if he had dental follow-up he said he did at [facility].  I told the patient that his teeth start becoming tender again and swollen he needs to call back immediately so he can get the antibiotics that were previously recommended he took this under advisement

## 2022-02-11 NOTE — Progress Notes (Deleted)
Cardiology Office Note:    Date:  02/11/2022   ID:  Richard Fuentes, DOB [birth year], MRN [id number]  PCP:  Elsie Stain, MD   [facility] Cardiologist:  Glenetta Hew, MD { Click to update primary MD,subspecialty MD or APP then REFRESH:1}    Referring MD: Elsie Stain, MD   No chief complaint on file. ***  History of Present Illness:    Richard Fuentes is a 52 y.o. male with a hx of ***  Past Medical History:  Diagnosis Date   Anxiety    Asthma    COPD (chronic obstructive pulmonary disease) ([place])    Severe centrilobular and paraseptal bullous emphysema; Gold stage D; FEV1 and DLCO<30%.;  Currently undergoing transplant evaluation   Dyspnea    Environmental allergies    Esophagitis    Family history of adverse reaction to anesthesia    Paternal aunt had trouble waking   GERD (gastroesophageal reflux disease)    Headache    Hx of migraines    OCD (obsessive compulsive disorder)    OCD (obsessive compulsive disorder)    Pneumonia    Pneumothorax on left 03/20/2020   Psoriasis    Sleep apnea    Urticaria     Past Surgical History:  Procedure Laterality Date   Endobronchial valve Left 03/20/2020   LUL at [place] Right 2002   arthroscopy and cartilidge removed   KNEE SURGERY Left 1987   Ligaments removed and screw in place    Current Medications: No outpatient medications have been marked as taking for the 02/16/22 encounter (Appointment) with Ledora Bottcher, [place].     Allergies:   Other and Shellfish allergy   Social History   Socioeconomic History   Marital status: Significant Other    Spouse name: Not on file   Number of children: Not on file   Years of education: Not on file   Highest education level: Not on file  Occupational History   Not on file  Tobacco Use   Smoking status: Former    Packs/day: 1.00    Years: 30.00    Total pack years: 30.00    Types: Cigarettes    Quit date: 01/19/2018    Years since  quitting: 4.0   Smokeless tobacco: Never  Vaping Use   Vaping Use: Never used  Substance and Sexual Activity   Alcohol use: No   Drug use: No   Sexual activity: Not Currently  Other Topics Concern   Not on file  Social History Narrative   Not on file   Social Determinants of Health   Financial Resource Strain: Not on file  Food Insecurity: Not on file  Transportation Needs: Not on file  Physical Activity: Not on file  Stress: Not on file  Social Connections: Not on file     Family History: The patient's ***family history includes CAD in his father and maternal uncle; Heart attack in his maternal uncle and mother; Heart attack (age of onset: 33) in his cousin; Heart attack (age of onset: 65) in his paternal grandfather; Heart attack (age of onset: 24) in his father; Multiple sclerosis in his sister; Psoriasis in his maternal grandmother; Sudden Cardiac Death (age of onset: 87) in his cousin; Sudden Cardiac Death (age of onset: 80) in his paternal grandfather. There is no history of Allergic rhinitis, Angioedema, Asthma, Eczema, Immunodeficiency, or Urticaria.  ROS:   Please see the history of present illness.    ***  All other systems reviewed and are negative.  EKGs/Labs/Other Studies Reviewed:    The following studies were reviewed today: ***  EKG:  EKG is *** ordered today.  The ekg ordered today demonstrates ***  Recent Labs: 03/23/2021: ALT 15; TSH 1.640 02/09/2022: BUN 16; Creatinine, Ser 1.23; Hemoglobin 15.2; Platelets 311; Potassium 3.8; Sodium 137  Recent Lipid Panel    Component Value Date/Time   CHOL 248 (H) 03/23/2021 1135   TRIG 135 03/23/2021 1135   HDL 44 03/23/2021 1135   CHOLHDL 5.6 (H) 03/23/2021 1135   LDLCALC 179 (H) 03/23/2021 1135     Risk Assessment/Calculations:   {Does this patient have ATRIAL FIBRILLATION?:[id number]}  No BP recorded.  {Refresh Note OR Click here to enter BP  :1}***         Physical Exam:    VS:  There were no vitals  taken for this visit.    Wt Readings from Last 3 Encounters:  02/09/22 184 lb 15.5 oz (83.9 kg)  01/17/22 185 lb (83.9 kg)  01/07/22 185 lb 9.6 oz (84.2 kg)     GEN: *** Well nourished, well developed in no acute distress HEENT: Normal NECK: No JVD; No carotid bruits LYMPHATICS: No lymphadenopathy CARDIAC: ***RRR, no murmurs, rubs, gallops RESPIRATORY:  Clear to auscultation without rales, wheezing or rhonchi  ABDOMEN: Soft, non-tender, non-distended MUSCULOSKELETAL:  No edema; No deformity  SKIN: Warm and dry NEUROLOGIC:  Alert and oriented x 3 PSYCHIATRIC:  Normal affect   ASSESSMENT:    No diagnosis found. PLAN:    In order of problems listed above:  ***      {Are you ordering a CV Procedure (e.g. stress test, cath, DCCV, TEE, etc)?   Press F2        :[id number]}    Medication Adjustments/Labs and Tests Ordered: Current medicines are reviewed at length with the patient today.  Concerns regarding medicines are outlined above.  No orders of the defined types were placed in this encounter.  No orders of the defined types were placed in this encounter.   There are no Patient Instructions on file for this visit.   Signed, Ledora Bottcher, [place]  02/11/2022 5:13 PM    [facility]

## 2022-02-14 NOTE — Progress Notes (Deleted)
Cardiology Clinic Note   Patient Name: Richard Fuentes Date of Encounter: 02/14/2022  Primary Care Provider:  Elsie Stain, MD Primary Cardiologist:  Glenetta Hew, MD  Patient Profile    Richard Fuentes is a 52 y.o. male with a past medical history of hyperlipidemia, COPD, OSA who presents to the clinic today for follow-up of chronic cardiac conditions.  Past Medical History    Past Medical History:  Diagnosis Date   Anxiety    Asthma    COPD (chronic obstructive pulmonary disease) (HCC)    Severe centrilobular and paraseptal bullous emphysema; Gold stage D; FEV1 and DLCO<30%.;  Currently undergoing transplant evaluation   Dyspnea    Environmental allergies    Esophagitis    Family history of adverse reaction to anesthesia    Paternal aunt had trouble waking   GERD (gastroesophageal reflux disease)    Headache    Hx of migraines    OCD (obsessive compulsive disorder)    OCD (obsessive compulsive disorder)    Pneumonia    Pneumothorax on left 03/20/2020   Psoriasis    Sleep apnea    Urticaria    Past Surgical History:  Procedure Laterality Date   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 Coronary calcium scoring 09/03/2019: Calcium score 4 (63rd percentile).  Calcium seen in proximal LAD. Lipid panel 03/23/2021: LDL 179, HDL 44, TG 135, total 248. OSA. COPD. Over read of coronary CT 09/03/2019: Severe centrilobular and paraseptal emphysema suggestive of underlying COPD. 03/20/2020 ([facility]): Bronchoscopy with endobronchial valve placement.  Located by left pneumothorax with chest tube placement.  Patient was first  evaluated by Dr. Ellyn Hack on 08/10/2019 for evaluation of chest tightness, racing heart, shortness of breath, and pulmonary hypertension at the request of Dr. Joya Gaskins (pulmonary medicine).  Patient had no cardiac history reported a family history of CAD with premature death.  It was requested patient undergo cardiac risk assessment secondary to upcoming bronchoscopy for potential Zephyr valve placement for nonsurgical lung decompression of the upper lobes.  Coronary CT was ordered, however patient could not complete test secondary to hypotension and inability to hold breath secondary to shortness of breath.  Coronary calcium score was low (4) so it was felt patient was at low risk for procedure.  Further evaluation was deferred to [facility] lung transplant team.  Patient was last seen in the office by Coletta Memos, NP on 04/01/2020.  He was doing fairly well at that point and slowly recovering from his lung procedure at [facility].  Today, patient ***  Hyperlipidemia.  LDL March 2023 179, not at goal.  ***  Home Medications    No outpatient medications have been marked as taking for the 02/16/22 encounter (Appointment) with Ledora Bottcher, [place].    Family History    Family History  Problem Relation Age of Onset   Multiple sclerosis Sister    Psoriasis Maternal Grandmother    Heart attack Mother        Late in life   Heart attack Father 80   CAD Father  Heart attack Paternal Grandfather 13       Sudden cardiac death   Sudden Cardiac Death Paternal Grandfather 41   Heart attack Maternal Uncle    CAD Maternal Uncle    Heart attack Cousin 22   Sudden Cardiac Death Cousin 82   Allergic rhinitis Neg Hx    Angioedema Neg Hx    Asthma Neg Hx    Eczema Neg Hx    Immunodeficiency Neg Hx    Urticaria Neg Hx    He indicated that his mother is deceased. He indicated that his father is deceased. He indicated that the status of his sister is unknown. He indicated that the status of his maternal  grandmother is unknown. He indicated that his paternal grandfather is deceased. He indicated that the status of his maternal uncle is unknown. He indicated that his cousin is deceased. He indicated that the status of his neg hx is unknown.   Social History    Social History   Socioeconomic History   Marital status: Significant Other    Spouse name: Not on file   Number of children: Not on file   Years of education: Not on file   Highest education level: Not on file  Occupational History   Not on file  Tobacco Use   Smoking status: Former    Packs/day: 1.00    Years: 30.00    Total pack years: 30.00    Types: Cigarettes    Quit date: 01/19/2018    Years since quitting: 4.0   Smokeless tobacco: Never  Vaping Use   Vaping Use: Never used  Substance and Sexual Activity   Alcohol use: No   Drug use: No   Sexual activity: Not Currently  Other Topics Concern   Not on file  Social History Narrative   Not on file   Social Determinants of Health   Financial Resource Strain: Not on file  Food Insecurity: Not on file  Transportation Needs: Not on file  Physical Activity: Not on file  Stress: Not on file  Social Connections: Not on file  Intimate Partner Violence: Not on file     Review of Systems    General: *** No chills, fever, night sweats or weight changes.  Cardiovascular:  No chest pain, dyspnea on exertion, edema, orthopnea, palpitations, paroxysmal nocturnal dyspnea. Dermatological: No rash, lesions/masses Respiratory: No cough, dyspnea Urologic: No hematuria, dysuria Abdominal:   No nausea, vomiting, diarrhea, bright red blood per rectum, melena, or hematemesis Neurologic:  No visual changes, weakness, changes in mental status. All other systems reviewed and are otherwise negative except as noted above.  Physical Exam    VS:  There were no vitals taken for this visit. , BMI There is no height or weight on file to calculate BMI. GEN: *** Well nourished, well  developed, in no acute distress. HEENT: Normal. Neck: Supple, no JVD, carotid bruits, or masses. Cardiac: RRR, no murmurs, rubs, or gallops. No clubbing, cyanosis, edema.  Radials/DP/PT 2+ and equal bilaterally.  Respiratory:  Respirations regular and unlabored, clear to auscultation bilaterally. GI: Soft, nontender, nondistended. MS: No deformity or atrophy. Skin: Warm and dry, no rash. Neuro: Strength and sensation are intact. Psych: Normal affect.  Accessory Clinical Findings    The following studies were reviewed for this visit: ***  Recent Labs: 03/23/2021: ALT 15; TSH 1.640 02/09/2022: BUN 16; Creatinine, Ser 1.23; Hemoglobin 15.2; Platelets 311; Potassium 3.8; Sodium 137   Recent Lipid Panel    Component Value Date/Time  CHOL 248 (H) 03/23/2021 1135   TRIG 135 03/23/2021 1135   HDL 44 03/23/2021 1135   CHOLHDL 5.6 (H) 03/23/2021 1135   LDLCALC 179 (H) 03/23/2021 1135    No BP recorded.  {Refresh Note OR Click here to enter BP  :1}***    ECG personally reviewed by me today: ***  No significant changes from ***  {Does this patient have ATRIAL FIBRILLATION?:[phone]}   Assessment & Plan   ***      Disposition: ***   Justice Britain. Himani Corona, DNP, NP-C     02/14/2022, 8:50 AM [place] [address] Office [phone] Fax [phone]

## 2022-02-15 DIAGNOSIS — J439 Emphysema, unspecified: Secondary | ICD-10-CM | POA: Diagnosis not present

## 2022-02-15 DIAGNOSIS — J9809 Other diseases of bronchus, not elsewhere classified: Secondary | ICD-10-CM | POA: Diagnosis not present

## 2022-02-15 DIAGNOSIS — R0609 Other forms of dyspnea: Secondary | ICD-10-CM | POA: Diagnosis not present

## 2022-02-15 DIAGNOSIS — J432 Centrilobular emphysema: Secondary | ICD-10-CM | POA: Diagnosis not present

## 2022-02-16 ENCOUNTER — Ambulatory Visit: Payer: Medicaid Other | Admitting: Physician Assistant

## 2022-02-16 NOTE — Progress Notes (Deleted)
error 

## 2022-02-17 ENCOUNTER — Encounter: Payer: Self-pay | Admitting: Physician Assistant

## 2022-02-17 ENCOUNTER — Ambulatory Visit: Payer: Medicaid Other | Attending: Physician Assistant | Admitting: Student

## 2022-02-17 VITALS — BP 102/60 | HR 76 | Ht 73.0 in | Wt 180.6 lb

## 2022-02-17 DIAGNOSIS — R0609 Other forms of dyspnea: Secondary | ICD-10-CM

## 2022-02-17 DIAGNOSIS — R072 Precordial pain: Secondary | ICD-10-CM | POA: Diagnosis not present

## 2022-02-17 DIAGNOSIS — E785 Hyperlipidemia, unspecified: Secondary | ICD-10-CM

## 2022-02-17 NOTE — Patient Instructions (Signed)
Medication Instructions:   Your physician recommends that you continue on your current medications as directed. Please refer to the Current Medication list given to you today.  *If you need a refill on your cardiac medications before your next appointment, please call your pharmacy*  Lab Work: NONE ordered at this time of appointment   If you have labs (blood work) drawn today and your tests are completely normal, you will receive your results only by: [place] (if you have MyChart) OR A paper copy in the mail If you have any lab test that is abnormal or we need to change your treatment, we will call you to review the results.  Testing/Procedures: Your physician has requested that you have an echocardiogram. Echocardiography is a painless test that uses sound waves to create images of your heart. It provides your doctor with information about the size and shape of your heart and how well your heart's chambers and valves are working. This procedure takes approximately one hour. There are no restrictions for this procedure. Please do NOT wear cologne, perfume, aftershave, or lotions (deodorant is allowed). Please arrive 15 minutes prior to your appointment time.  Your physician has requested that you have a carotid duplex. This test is an ultrasound of the carotid arteries in your neck. It looks at blood flow through these arteries that supply the brain with blood. Allow one hour for this exam. There are no restrictions or special instructions.  Follow-Up: At [facility], you and your health needs are our priority.  As part of our continuing mission to provide you with exceptional heart care, we have created designated Provider Care Teams.  These Care Teams include your primary Cardiologist (physician) and Advanced Practice Providers (APPs -  Physician Assistants and Nurse Practitioners) who all work together to provide you with the care you need, when you need it.    Your next  appointment:   After tests/scans   Provider:   Mayra Reel, NP        Other Instructions

## 2022-02-17 NOTE — Progress Notes (Signed)
Cardiology Clinic Note   Patient Name: Richard Fuentes Date of Encounter: 02/17/2022  Primary Care Provider:  Elsie Stain, MD Primary Cardiologist:  Glenetta Hew, MD  Patient Profile    Richard Fuentes is a 52 y.o. male with a past medical history of hyperlipidemia, COPD, OSA who presents to the clinic today for follow-up of chronic cardiac conditions.   Past Medical History    Past Medical History:  Diagnosis Date   Anxiety    Asthma    COPD (chronic obstructive pulmonary disease) (HCC)    Severe centrilobular and paraseptal bullous emphysema; Gold stage D; FEV1 and DLCO<30%.;  Currently undergoing transplant evaluation   Dyspnea    Environmental allergies    Esophagitis    Family history of adverse reaction to anesthesia    Paternal aunt had trouble waking   GERD (gastroesophageal reflux disease)    Headache    Hx of migraines    OCD (obsessive compulsive disorder)    OCD (obsessive compulsive disorder)    Pneumonia    Pneumothorax on left 03/20/2020   Psoriasis    Sleep apnea    Urticaria    Past Surgical History:  Procedure Laterality Date   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 Coronary calcium scoring 09/03/2019: Calcium score 4 (63rd percentile).  Calcium seen in proximal LAD. Lipid panel 03/23/2021: LDL 179, HDL 44, TG 135, total 248. OSA. COPD. Over read of coronary CT 09/03/2019: Severe centrilobular and paraseptal emphysema suggestive of underlying COPD. 03/20/2020 ([facility]): Bronchoscopy with endobronchial valve placement.  Located by left pneumothorax with chest  tube placement.   Patient was first evaluated by Dr. Ellyn Hack on 08/10/2019 for evaluation of chest tightness, racing heart, shortness of breath, and pulmonary hypertension at the request of Dr. Joya Gaskins (pulmonary medicine).  Patient had no cardiac history reported a family history of CAD with premature death.  It was requested patient undergo cardiac risk assessment secondary to upcoming bronchoscopy for potential Zephyr valve placement for nonsurgical lung decompression of the upper lobes.  Coronary CT was ordered, however patient could not complete test secondary to hypotension and inability to hold breath secondary to shortness of breath.  Coronary calcium score was low (4) so it was felt patient was at low risk for procedure.  Further evaluation was deferred to [facility] lung transplant team.   Patient was last seen in the office by Coletta Memos, NP on 04/01/2020.  He was doing fairly well at that point and slowly recovering from his lung procedure at [facility].   Today, patient is here alone.  He reports he initially made this appointment to be evaluated for left-sided stabbing chest pain that has been ongoing for months.  He reports pain is associated with exertion that causes him shortness of breath.  He explains very little activity such as showering or getting dressed will cause shortness of breath.  Patient reports pain will last even after his shortness of breath comes down.  Patient was recently started on a steroid Dosepak and his pain  is markedly improved.  He also reports a large disparity between BP on his right arm (higher) and left arm.  Patient shares that he is not taking lipid medication.  He reports he suffers greatly from OCD and has a very hard time taking any medications.  Patient was recently seen at [facility].  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  A new valve was put in early in 2023 and failed again in August.  His  PFTs are severely reduced, back to where he was prior to his first endobronchial valve placement.  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  Plan is now to try endobronchial valve placement in his right lung.  If that fails plan is for right lung lobectomy.  Patient needs to have a CT scan of his chest before he can qualify for this procedure.  He would like this appointment to be a preoperative risk assessment, as he knows [facility] well require 1 if he is eligible for the surgery.    Home Medications    Current Meds  Medication Sig   albuterol (VENTOLIN HFA) 108 (90 Base) MCG/ACT inhaler Inhale 2 puffs into the lungs every 6 (six) hours as needed for wheezing or shortness of breath.   esomeprazole (NEXIUM) 40 MG capsule Take 1 capsule (40 mg total) by mouth 2 (two) times daily before a meal.   Fluticasone-Umeclidin-Vilant (TRELEGY ELLIPTA) 200-62.5-25 MCG/ACT AEPB Inhale 1 puff into the lungs daily.   hydrocortisone 2.5 % cream Apply 1 application topically 2 (two) times daily as needed (psoriasis).   ipratropium-albuterol (DUONEB) 0.5-2.5 (3) MG/3ML SOLN Inhale 3 mLs into the lungs every 6 (six) hours as needed (shortness of breath).   OXYGEN Inhale 2 L into the lungs continuous.   predniSONE (DELTASONE) 20 MG tablet Take 20 mg by mouth daily with breakfast.    Family History    Family History  Problem Relation Age of Onset   Multiple sclerosis Sister    Psoriasis Maternal Grandmother    Heart attack Mother        Late in life   Heart attack Father 36   CAD Father    Heart attack Paternal Grandfather 5       Sudden cardiac death   Sudden Cardiac Death Paternal Grandfather 63   Heart attack Maternal Uncle    CAD Maternal Uncle    Heart attack Cousin 74   Sudden Cardiac Death Cousin 13   Allergic rhinitis Neg Hx    Angioedema Neg Hx    Asthma Neg Hx    Eczema Neg Hx    Immunodeficiency Neg Hx     Urticaria Neg Hx    He indicated that his mother is deceased. He indicated that his father is deceased. He indicated that the status of his sister is unknown. He indicated that the status of his maternal grandmother is unknown. He indicated that his paternal grandfather is deceased. He indicated that the status of his maternal uncle is unknown. He indicated that his cousin is deceased. He indicated that the status of his neg hx is unknown.   Social History    Social History   Socioeconomic History   Marital status: Significant Other    Spouse name: Not on file   Number of children: Not on file   Years  of education: Not on file   Highest education level: Not on file  Occupational History   Not on file  Tobacco Use   Smoking status: Former    Packs/day: 1.00    Years: 30.00    Total pack years: 30.00    Types: Cigarettes    Quit date: 01/19/2018    Years since quitting: 4.0   Smokeless tobacco: Never  Vaping Use   Vaping Use: Never used  Substance and Sexual Activity   Alcohol use: No   Drug use: No   Sexual activity: Not Currently  Other Topics Concern   Not on file  Social History Narrative   Not on file   Social Determinants of Health   Financial Resource Strain: Not on file  Food Insecurity: Not on file  Transportation Needs: Not on file  Physical Activity: Not on file  Stress: Not on file  Social Connections: Not on file  Intimate Partner Violence: Not on file     Review of Systems    General: No chills, fever, night sweats or weight changes.  Cardiovascular: Positive for chest pain and DOE.  No edema, orthopnea, palpitations, paroxysmal nocturnal dyspnea. Dermatological: No rash, lesions/masses Respiratory: Positive for chronic cough and dyspnea. Urologic: No hematuria, dysuria Abdominal:   No nausea, vomiting, diarrhea, bright red blood per rectum, melena, or hematemesis Neurologic:  No visual changes, weakness, changes in mental status. All other systems  reviewed and are otherwise negative except as noted above.  Physical Exam    VS:  BP 102/60 (BP Location: Left Arm, Patient Position: Sitting, Cuff Size: Normal)   Pulse 76   Ht 6' 1"$  (1.854 m)   Wt 180 lb 9.6 oz (81.9 kg)   SpO2 98%   BMI 23.83 kg/m  , BMI Body mass index is 23.83 kg/m. GEN:  Well nourished, well developed, in no acute distress. HEENT: Normal. Neck: Supple, no JVD, carotid bruits, or masses. Cardiac: RRR, no murmurs, rubs, or gallops. No clubbing, cyanosis, edema.  Radials/DP/PT 2+ and equal bilaterally.  Respiratory:  Respirations regular and unlabored, diminished breath sounds bilaterally. GI: Soft, nontender, nondistended. MS: No deformity or atrophy. Skin: Warm and dry, no rash. Neuro: Strength and sensation are intact. Psych: Anxious affect.  Accessory Clinical Findings     Recent Labs: 03/23/2021: ALT 15; TSH 1.640 02/09/2022: BUN 16; Creatinine, Ser 1.23; Hemoglobin 15.2; Platelets 311; Potassium 3.8; Sodium 137   Recent Lipid Panel    Component Value Date/Time   CHOL 248 (H) 03/23/2021 1135   TRIG 135 03/23/2021 1135   HDL 44 03/23/2021 1135   CHOLHDL 5.6 (H) 03/23/2021 1135   LDLCALC 179 (H) 03/23/2021 1135     ECG personally reviewed by me today: NSR, rate 68 bpm.  No significant changes from 02/10/2021.    Assessment & Plan   Chest pain/DOE.  Patient reports a month-long history of left-sided stabbing chest pain that is associated with dyspnea on exertion.  Secondary to patient's lung disease he becomes dyspneic with very little activity such as getting dressed or taking a shower.  Pain will linger for up to 2 hours after he gets his breathing under control.  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  He is unsure if this is causing his pain.  He was recently started on a steroid Dosepak which has improved his pain.  Patient reported chest pain during exam so EKG was  obtained, which showed NSR, rate 68 bpm.  In 2021, patient was unable to complete a coronary CT secondary to being unable to hold his breath and having uncontrollable coughing.  He also reports having severe hypotension at home after the test.  His lung disease will also not allow for a nuclear stress test.  Despite atypical nature of chest pain, will get an echo to evaluate heart function and the presence of wall motion abnormality to guide if further ischemic workup is required. BP inconsistency right versus left arms.  Patient reports BP difference left versus right arms.  He states sometimes BP left arm will be 107/60 and BP in right arm will be 160/80.  BP is compared today right arm 110/60 left arm 102/60.  Will get carotid ultrasound to further assess. Hyperlipidemia.  LDL March 2023 179, not at goal.  Patient has not taken prescribed lipid medication secondary to very serious problems with OCD.  Attempted to discuss this with patient, however he became very anxious.  We agreed to revisit this conversation when he returns after testing.  Disposition: Echo, carotid ultrasound.  Return after testing or sooner as needed.   Justice Britain. Brooke Steinhilber, DNP, NP-C     02/17/2022, 3:24 PM [place] [address] Office [phone] Fax [phone]

## 2022-02-23 ENCOUNTER — Ambulatory Visit (HOSPITAL_COMMUNITY)
Admission: RE | Admit: 2022-02-23 | Discharge: 2022-02-23 | Disposition: A | Discharge status: Home / Self Care | Payer: Medicaid Other | Source: Ambulatory Visit | Attending: Student | Admitting: Student

## 2022-02-23 DIAGNOSIS — E785 Hyperlipidemia, unspecified: Secondary | ICD-10-CM

## 2022-02-25 ENCOUNTER — Other Ambulatory Visit (HOSPITAL_BASED_OUTPATIENT_CLINIC_OR_DEPARTMENT_OTHER): Payer: Self-pay | Admitting: Internal Medicine

## 2022-02-25 ENCOUNTER — Other Ambulatory Visit (HOSPITAL_COMMUNITY): Payer: Self-pay | Admitting: Internal Medicine

## 2022-02-25 DIAGNOSIS — R0609 Other forms of dyspnea: Secondary | ICD-10-CM

## 2022-02-26 ENCOUNTER — Other Ambulatory Visit: Payer: Self-pay | Admitting: Critical Care Medicine

## 2022-02-26 ENCOUNTER — Other Ambulatory Visit (HOSPITAL_BASED_OUTPATIENT_CLINIC_OR_DEPARTMENT_OTHER): Payer: Self-pay

## 2022-02-26 ENCOUNTER — Other Ambulatory Visit: Payer: Self-pay

## 2022-02-26 MED ORDER — PREDNISONE 20 MG PO TABS
20.0000 mg | ORAL_TABLET | Freq: Every day | ORAL | 0 refills | Status: DC
  Filled 2022-02-26: qty 5, 5d supply, fill #0

## 2022-02-26 MED ORDER — TRELEGY ELLIPTA 200-62.5-25 MCG/ACT IN AEPB
1.0000 | INHALATION_SPRAY | Freq: Every day | RESPIRATORY_TRACT | 3 refills | Status: DC
  Filled 2022-02-26 – 2022-03-01 (×2): qty 60, 30d supply, fill #0

## 2022-02-26 NOTE — Telephone Encounter (Signed)
Requested medication (s) are due for refill today: yes  Requested medication (s) are on the active medication list: yes  Last refill:  01/07/22 #60  Future visit scheduled: yes  Notes to clinic:  med not assigned to a protocol   Requested Prescriptions  Pending Prescriptions Disp Refills   Fluticasone-Umeclidin-Vilant (TRELEGY ELLIPTA) 200-62.5-25 MCG/ACT AEPB 60 each 0    Sig: Inhale 1 puff into the lungs daily for 8 days.     Off-Protocol Failed - 02/26/2022  9:16 AM      Failed - Medication not assigned to a protocol, review manually.      Passed - Valid encounter within last 12 months    Recent Outpatient Visits           1 month ago Panlobular emphysema [facility])   [place] Elsie Stain, MD   6 months ago Panlobular emphysema [facility])   [place] Elsie Stain, MD   8 months ago Pharyngitis, unspecified etiology   [place] Elsie Stain, MD   9 months ago Panlobular emphysema [facility])   [place] Elsie Stain, MD   11 months ago Panlobular emphysema [facility])   [place] Elsie Stain, MD       Future Appointments             In 1 month [place], Neoma Laming, NP [place] at [facility]   In 2 months Joya Gaskins, Burnett Harry, MD [place]

## 2022-03-01 ENCOUNTER — Other Ambulatory Visit (HOSPITAL_BASED_OUTPATIENT_CLINIC_OR_DEPARTMENT_OTHER): Payer: Self-pay

## 2022-03-05 ENCOUNTER — Ambulatory Visit (HOSPITAL_COMMUNITY): Payer: Medicaid Other

## 2022-03-05 DIAGNOSIS — J449 Chronic obstructive pulmonary disease, unspecified: Secondary | ICD-10-CM | POA: Diagnosis not present

## 2022-03-11 ENCOUNTER — Other Ambulatory Visit: Payer: Self-pay | Admitting: Critical Care Medicine

## 2022-03-11 DIAGNOSIS — J431 Panlobular emphysema: Secondary | ICD-10-CM

## 2022-03-11 DIAGNOSIS — G4733 Obstructive sleep apnea (adult) (pediatric): Secondary | ICD-10-CM

## 2022-03-16 ENCOUNTER — Ambulatory Visit (HOSPITAL_COMMUNITY): Payer: Medicaid Other | Attending: Cardiovascular Disease

## 2022-03-16 DIAGNOSIS — R0609 Other forms of dyspnea: Secondary | ICD-10-CM | POA: Diagnosis not present

## 2022-03-16 LAB — ECHOCARDIOGRAM COMPLETE
Area-P 1/2: 3.36 cm2
S' Lateral: 2.1 cm

## 2022-03-18 ENCOUNTER — Ambulatory Visit (HOSPITAL_COMMUNITY): Payer: Medicaid Other

## 2022-03-25 NOTE — Progress Notes (Deleted)
Cardiology Clinic Note   Date: 03/25/2022 ID: Richard Fuentes, DOB 03/07/1970, MRN [id number]  Primary Cardiologist:  Richard Hew, MD  Patient Profile    Richard Fuentes is a 52 y.o. male who presents to the clinic today for follow-up after testing.  Past medical history significant for: Hyperlipidemia. Coronary calcium scoring 09/03/2019: Calcium score 4 (63rd percentile).  Calcium seen in proximal LAD. Lipid panel 03/23/2021: LDL 179, HDL 44, TG 135, total 248. Atypical chest pain/DOE: Echo 03/16/2022: EF 70 to 75%.  Trivial MR. BP inconsistency right breast left arm. Right ultrasound 02/23/2022: No evidence of stenosis bilateral ICA.  Normal flow bilateral subclavian arteries. OSA. COPD. Over read of coronary CT 09/03/2019: Severe centrilobular and paraseptal emphysema suggestive of underlying COPD. 03/20/2020 ([facility]): Bronchoscopy with endobronchial valve placement.  Located by left pneumothorax with chest tube placement.   History of Present Illness    Richard Fuentes was first evaluated by Dr. Ellyn Fuentes on 08/10/2019 for evaluation of chest tightness, racing heart, shortness of breath, and pulmonary hypertension at the request of Dr. Joya Fuentes (pulmonary medicine).  Patient had no cardiac history reported a family history of CAD with premature death.  It was requested patient undergo cardiac risk assessment secondary to upcoming bronchoscopy for potential Zephyr valve placement for nonsurgical lung decompression of the upper lobes.  Coronary CT was ordered, however patient could not complete test secondary to hypotension and inability to hold breath secondary to shortness of breath.  Coronary calcium score was low (4) so it was felt patient was at low risk for procedure.  Further evaluation was deferred to [facility] lung transplant team.   Patient continues to be followed at [facility] for his lung disease.  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  A new valve was put in early in 2023 and failed again in August.  His PFTs are severely reduced, back to where he was prior to his first endobronchial valve placement.  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  Plan is now to try endobronchial valve placement in his right lung.  If that fails plan is for right lung lobectomy.  Patient needs to have a CT scan of his chest before he can qualify for this procedure.    Patient was last seen in the office by me on 02/17/2022 with complaints of months long history of left-sided chest pain associated with any exertion that causes shortness of breath.  Secondary to his lung disease very little exertion causes shortness of breath.  He had recently been started on a steroid Dosepak and felt his pain was markedly improved.  He also reported a large disparity between BP in his right arm (higher) compared to the left.  Patient reported he suffers from OCD and has a very hard time taking medications so he had not been taking his lipid medication.  Patient requested be a preoperative risk assessment, as he knows [facility] will require this prior to surgery.  Explained risk could not be assessed until further testing was completed based on his complaints.  Echo and carotid ultrasound were normal.   Today, patient ***  Atypical chest pain/DOE.  Echo March 2024 showed normal LV function with no valvular abnormalities.  Patient *** Preoperative cardiovascular risk assessment. ***  Chart reviewed as part of pre-operative protocol coverage. According to the  RCRI, patient has a ***% risk of MACE. Patient reports activity equivalent to 4.0 METS (***).   Given past medical history and time since last visit, based on ACC/AHA guidelines, Richard Fuentes would be at acceptable risk for the planned procedure without further cardiovascular testing.   Patient was advised that if ***  develops new symptoms prior to surgery to contact our office to arrange a follow-up appointment.  *** verbalized understanding.  I will route this recommendation to the requesting party via Epic fax function and remove from pre-op pool.  Please call with questions.       ROS: All other systems reviewed and are otherwise negative except as noted in History of Present Illness.  Studies Reviewed    ECG personally reviewed by me today: ***  No significant changes from ***  Risk Assessment/Calculations    {Does this patient have ATRIAL FIBRILLATION?[phone]} No BP recorded.  {Refresh Note OR Click here to enter BP  :1}***        Physical Exam    VS:  There were no vitals taken for this visit. , BMI There is no height or weight on file to calculate BMI.  GEN: Well nourished, well developed, in no acute distress. Neck: No JVD or carotid bruits. Cardiac: *** RRR. No murmurs. No rubs or gallops.   Respiratory:  Respirations regular and unlabored. Clear to auscultation without rales, wheezing or rhonchi. GI: Soft, nontender, nondistended. Extremities: Radials/DP/PT 2+ and equal bilaterally. No clubbing or cyanosis. No edema ***  Skin: Warm and dry, no rash. Neuro: Strength intact.  Assessment & Plan   ***  Disposition: ***     {Are you ordering a CV Procedure (e.g. stress test, cath, DCCV, TEE, etc)?   Press F2        :[id number]   Signed, Justice Britain. Debar Plate, DNP, NP-C

## 2022-03-29 ENCOUNTER — Other Ambulatory Visit (HOSPITAL_BASED_OUTPATIENT_CLINIC_OR_DEPARTMENT_OTHER): Payer: Self-pay

## 2022-03-29 ENCOUNTER — Ambulatory Visit: Payer: Medicaid Other | Admitting: Student

## 2022-03-29 MED ORDER — MOXIFLOXACIN HCL 0.5 % OP SOLN
1.0000 [drp] | OPHTHALMIC | 1 refills | Status: DC
  Filled 2022-03-29: qty 3, 8d supply, fill #0

## 2022-03-30 ENCOUNTER — Ambulatory Visit (HOSPITAL_BASED_OUTPATIENT_CLINIC_OR_DEPARTMENT_OTHER): Payer: Medicaid Other

## 2022-03-30 ENCOUNTER — Encounter (HOSPITAL_BASED_OUTPATIENT_CLINIC_OR_DEPARTMENT_OTHER): Payer: Self-pay

## 2022-03-30 DIAGNOSIS — G43109 Migraine with aura, not intractable, without status migrainosus: Secondary | ICD-10-CM | POA: Insufficient documentation

## 2022-04-05 DIAGNOSIS — R0609 Other forms of dyspnea: Secondary | ICD-10-CM | POA: Diagnosis not present

## 2022-04-05 DIAGNOSIS — R911 Solitary pulmonary nodule: Secondary | ICD-10-CM | POA: Diagnosis not present

## 2022-04-05 DIAGNOSIS — R0602 Shortness of breath: Secondary | ICD-10-CM | POA: Diagnosis not present

## 2022-04-05 DIAGNOSIS — J9811 Atelectasis: Secondary | ICD-10-CM | POA: Diagnosis not present

## 2022-04-05 DIAGNOSIS — J432 Centrilobular emphysema: Secondary | ICD-10-CM | POA: Diagnosis not present

## 2022-04-07 ENCOUNTER — Other Ambulatory Visit (HOSPITAL_BASED_OUTPATIENT_CLINIC_OR_DEPARTMENT_OTHER): Payer: Self-pay

## 2022-04-07 MED ORDER — VALACYCLOVIR HCL 1 G PO TABS
1000.0000 mg | ORAL_TABLET | Freq: Three times a day (TID) | ORAL | 0 refills | Status: AC
  Filled 2022-04-07: qty 42, 14d supply, fill #0

## 2022-04-07 MED ORDER — FLUOROMETHOLONE 0.1 % OP SUSP
1.0000 [drp] | Freq: Four times a day (QID) | OPHTHALMIC | 1 refills | Status: DC
  Filled 2022-04-07: qty 5, 25d supply, fill #0

## 2022-04-14 DIAGNOSIS — L400 Psoriasis vulgaris: Secondary | ICD-10-CM | POA: Diagnosis not present

## 2022-04-22 NOTE — Progress Notes (Deleted)
Cardiology Clinic Note   Date: 04/22/2022 ID: Richard Fuentes, DOB [birth year], MRN [id number]  Primary Cardiologist:  Bryan Lemma, MD  Patient Profile    Richard Fuentes is a 52 y.o. male who presents to the clinic today for follow-up after testing.  Past medical history significant for: Hyperlipidemia. Coronary calcium scoring 09/03/2019: Calcium score 4 (63rd percentile).  Calcium seen in proximal LAD. Lipid panel 03/23/2021: LDL 179, HDL 44, TG 135, total 248. Atypical chest pain/DOE: Echo 03/16/2022: EF 70 to 75%.  Trivial MR. BP inconsistency right breast left arm. Right ultrasound 02/23/2022: No evidence of stenosis bilateral ICA.  Normal flow bilateral subclavian arteries. OSA. COPD. Over read of coronary CT 09/03/2019: Severe centrilobular and paraseptal emphysema suggestive of underlying COPD. 03/20/2020 ([facility]): Bronchoscopy with endobronchial valve placement.  Located by left pneumothorax with chest tube placement.   History of Present Illness    Richard Fuentes was first evaluated by Dr. Herbie Baltimore on 08/10/2019 for evaluation of chest tightness, racing heart, shortness of breath, and pulmonary hypertension at the request of Dr. Delford Field (pulmonary medicine).  Patient had no cardiac history reported a family history of CAD with premature death.  It was requested patient undergo cardiac risk assessment secondary to upcoming bronchoscopy for potential Zephyr valve placement for nonsurgical lung decompression of the upper lobes.  Coronary CT was ordered, however patient could not complete test secondary to hypotension and inability to hold breath secondary to shortness of breath.  Coronary calcium score was low (4) so it was felt patient was at low risk for procedure.  Further evaluation was deferred to [facility] lung transplant team.   Patient continues to be followed at [facility] for his lung disease.  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  A new valve was put in early in 2023 and failed again in August.  His PFTs are severely reduced, back to where he was prior to his first endobronchial valve placement.  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  Plan is now to try endobronchial valve placement in his right lung.  If that fails plan is for right lung lobectomy.  Patient needs to have a CT scan of his chest before he can qualify for this procedure.    Patient was last seen in the office by me on 02/17/2022 with complaints of months long history of left-sided chest pain associated with any exertion that causes shortness of breath.  Secondary to his lung disease very little exertion causes shortness of breath.  He had recently been started on a steroid Dosepak and felt his pain was markedly improved.  He also reported a large disparity between BP in his right arm (higher) compared to the left.  Patient reported he suffers from OCD and has a very hard time taking medications so he had not been taking his lipid medication.  Patient requested be a preoperative risk assessment, as he knows [facility] will require this prior to surgery.  Explained risk could not be assessed until further testing was completed based on his complaints.  Echo and carotid ultrasound were normal.   Today, patient ***  Atypical chest pain/DOE.  Echo March 2024 showed normal LV function with no valvular abnormalities.  Patient *** Preoperative cardiovascular risk assessment. ***  Chart reviewed as part of pre-operative protocol coverage. According to the  RCRI, patient has a ***% risk of MACE. Patient reports activity equivalent to 4.0 METS (***).   Given past medical history and time since last visit, based on ACC/AHA guidelines, Richard Fuentes would be at acceptable risk for the planned procedure without further cardiovascular testing.   Patient was advised that if ***  develops new symptoms prior to surgery to contact our office to arrange a follow-up appointment.  *** verbalized understanding.  I will route this recommendation to the requesting party via Epic fax function and remove from pre-op pool.  Please call with questions.       ROS: All other systems reviewed and are otherwise negative except as noted in History of Present Illness.  Studies Reviewed    ECG personally reviewed by me today: ***  No significant changes from ***  Risk Assessment/Calculations    {Does this patient have ATRIAL FIBRILLATION?:[phone]} No BP recorded.  {Refresh Note OR Click here to enter BP  :1}***        Physical Exam    VS:  There were no vitals taken for this visit. , BMI There is no height or weight on file to calculate BMI.  GEN: Well nourished, well developed, in no acute distress. Neck: No JVD or carotid bruits. Cardiac: *** RRR. No murmurs. No rubs or gallops.   Respiratory:  Respirations regular and unlabored. Clear to auscultation without rales, wheezing or rhonchi. GI: Soft, nontender, nondistended. Extremities: Radials/DP/PT 2+ and equal bilaterally. No clubbing or cyanosis. No edema ***  Skin: Warm and dry, no rash. Neuro: Strength intact.  Assessment & Plan   ***  Disposition: ***     {Are you ordering a CV Procedure (e.g. stress test, cath, DCCV, TEE, etc)?   Press F2        :[id number]}   Signed, Etta Grandchild. Emry Barbato, DNP, NP-C

## 2022-04-23 ENCOUNTER — Ambulatory Visit: Payer: Medicaid Other | Attending: Student | Admitting: Student

## 2022-04-28 DIAGNOSIS — J449 Chronic obstructive pulmonary disease, unspecified: Secondary | ICD-10-CM | POA: Diagnosis not present

## 2022-05-13 ENCOUNTER — Ambulatory Visit: Payer: Medicaid Other | Attending: Critical Care Medicine | Admitting: Critical Care Medicine

## 2022-05-13 ENCOUNTER — Other Ambulatory Visit (HOSPITAL_BASED_OUTPATIENT_CLINIC_OR_DEPARTMENT_OTHER): Payer: Self-pay

## 2022-05-13 ENCOUNTER — Encounter: Payer: Self-pay | Admitting: Critical Care Medicine

## 2022-05-13 ENCOUNTER — Telehealth: Payer: Self-pay

## 2022-05-13 ENCOUNTER — Other Ambulatory Visit: Payer: Self-pay

## 2022-05-13 VITALS — BP 100/65 | HR 75 | Ht 73.0 in | Wt 178.4 lb

## 2022-05-13 DIAGNOSIS — K4031 Unilateral inguinal hernia, with obstruction, without gangrene, recurrent: Secondary | ICD-10-CM

## 2022-05-13 DIAGNOSIS — K010 Embedded teeth: Secondary | ICD-10-CM | POA: Insufficient documentation

## 2022-05-13 DIAGNOSIS — E785 Hyperlipidemia, unspecified: Secondary | ICD-10-CM | POA: Diagnosis not present

## 2022-05-13 DIAGNOSIS — K011 Impacted teeth: Secondary | ICD-10-CM

## 2022-05-13 DIAGNOSIS — F331 Major depressive disorder, recurrent, moderate: Secondary | ICD-10-CM

## 2022-05-13 DIAGNOSIS — J9611 Chronic respiratory failure with hypoxia: Secondary | ICD-10-CM | POA: Diagnosis not present

## 2022-05-13 DIAGNOSIS — J431 Panlobular emphysema: Secondary | ICD-10-CM

## 2022-05-13 DIAGNOSIS — L400 Psoriasis vulgaris: Secondary | ICD-10-CM | POA: Diagnosis not present

## 2022-05-13 DIAGNOSIS — K056 Periodontal disease, unspecified: Secondary | ICD-10-CM | POA: Diagnosis not present

## 2022-05-13 MED ORDER — IPRATROPIUM-ALBUTEROL 0.5-2.5 (3) MG/3ML IN SOLN
3.0000 mL | Freq: Four times a day (QID) | RESPIRATORY_TRACT | 0 refills | Status: DC | PRN
  Filled 2022-05-13 – 2022-05-25 (×2): qty 360, 30d supply, fill #0

## 2022-05-13 MED ORDER — VENTOLIN HFA 108 (90 BASE) MCG/ACT IN AERS
2.0000 | INHALATION_SPRAY | RESPIRATORY_TRACT | 2 refills | Status: DC | PRN
  Filled 2022-05-13 – 2022-05-25 (×2): qty 54, 75d supply, fill #0
  Filled 2022-08-12: qty 54, 75d supply, fill #1

## 2022-05-13 MED ORDER — TRELEGY ELLIPTA 200-62.5-25 MCG/ACT IN AEPB
1.0000 | INHALATION_SPRAY | Freq: Every day | RESPIRATORY_TRACT | 3 refills | Status: DC
  Filled 2022-05-13 – 2022-05-25 (×2): qty 60, 30d supply, fill #0
  Filled 2022-08-12: qty 60, 30d supply, fill #1
  Filled 2022-09-14: qty 60, 30d supply, fill #2

## 2022-05-13 MED ORDER — ESOMEPRAZOLE MAGNESIUM 40 MG PO CPDR
40.0000 mg | DELAYED_RELEASE_CAPSULE | Freq: Two times a day (BID) | ORAL | 2 refills | Status: DC
  Filled 2022-05-13 – 2022-08-12 (×2): qty 60, 30d supply, fill #0

## 2022-05-13 NOTE — Assessment & Plan Note (Signed)
Referral to oral surgery.

## 2022-05-13 NOTE — Progress Notes (Signed)
Established Patient Office Visit  Subjective   Patient ID: Nedim Venn, male    DOB: 1970-02-08  Age: 52 y.o. MRN: [id number]  Chief Complaint  Patient presents with   Asthma   COPD    01/07/22 Since last visit patient has been to [facility] they are recommending a lung volume reduction surgery see below [facility] 11/2021 EIDEN BLACKARD [id number] is a 52 y.o. male referred to evaluate for lung volume reduction surgery (LVRS). Patient is a former, 66 pack-year smoker who was diagnosed with COPD/emphysema I 2013. He developed Covid in January 2021, after which his symptoms worsened to the point patient was struggling to perform ADLs and could not ambulate more than 100 feet. Labwork done 2021 at [facility] was negative for alpha-1 anti-trypsin. He consulted with [facility] 02/2020. 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 Transplant evaluation was closed.  Patient underwent endobronchial valve placement on 03/20/2020 with Dr. Waldon Merl, and valve replacement on 01/15/2021 with Dr. Fidela Juneau. CT Chest on 03/10/2021 showed an increase in LUL bandlike atelectasis with volume loss. He returned to see Dr. Nada Boozer on 08/10/2021. Patient reports that, while his dyspnea has slightly improved following valve revision in 01/2021, improvement has not been as notable as he experienced following initial valve placement. He reports dyspnea is problematic when eating. Patient inquired about additional valve revisions; Dr. Nada Boozer reviewed rationale for not revising the valves any further. Dr. Nada Boozer has referred patient to Thoracic Surgeon, Dr. Karie Georges, to evaluate for LVRS.  Patient participated in pulmonary rehabilitation at [facility] in 07/2020, and re-enrolled in pulmonary rehab in 2023. BMI is 25.28. '  Dr. Rhona Raider reviewed patient case and indicated patient may be a candidate for unilateral LVRS  on the right. However, have complete pulmonary rehab again, or participating in it.  He presents today with VQ scan and clinic for consult.  Today, pt notes that he is better since starting rehab but still has poor exercise tolerance. He notes that he is seeking treatment of his OCD.  Smoking status: former smoker; quit 2020. Hx 2ppd x 34 years = 68 pack year. Former Advertising account planner use; quit 2020. Hx marijuana use; quit 1987.  Anticoagulation/Antiplatelet: none  PMH also includes: obsessive compulsive disorder, anxiety , OSA on CPAP, chronic periodontal disease. He consulted with [facility], Dr. Dr. Kathrine Haddock, on 05/27/2021. Plan is for full mouth extractions with dentures versus referral to [facility], Dr. Joni Reining Messenger, for comprehensive dental evaluation for possible multiple extractions, scaling and root planing, and multiple restorations. FRAN SCHULENBERG is a 52 y.o. male with a history of bullous emphysema. Evaluation today indicates he is an appropriate surgical candidate for unilateral LVRS.  Results of the above studies were discussed with the patient. The nature, purpose, risks, benefits and alternatives regarding Right VATS LVRS were explained, and the patient was given the opportunity to ask questions and have these questions answered to their satisfaction. He offered that we would work with Dr. Willeen Niece for him to have a full mouth extraction at the time of the LVRS. He wished to consider his options and get back to [place]. Our office contact information was provided to the patient.   Patient also has ongoing psoriasis was supposed to start [place] but held off.  He needs to have his teeth extracted and a left hernia repaired in the inguinal area.  Asked questions on  whether I am primary and healthy Blue we have documented he is we will get him further resources because he does not seem to be able to sign up for [place].  Blood pressure on arrival good 100/64.  He continues to  sleep very poorly.  The patient had sleep study showing desaturation and OSA he cannot use cpap ,uses Oxygen 2L   05/13/22 Patient seen in return follow-up last contact was in February when we did a phone visit encouraging him to get follow-up dental care which she is yet to achieve. The patient was also seen in February by cardiology blood pressures and evaluations as below Cards :02/2022 1. Chest pain/DOE.  Patient reports a month-long history of left-sided stabbing chest pain that is associated with dyspnea on exertion.  Secondary to patient's lung disease he becomes dyspneic with very little activity such as getting dressed or taking a shower.  Pain will linger for up to 2 hours after he gets his breathing under control.  He recently went to [facility] and discovered one of the failed endobronchial valves is migrating to the bottom of his lung.  He is unsure if this is causing his pain.  He was recently started on a steroid Dosepak which has improved his pain.  Patient reported chest pain during exam so EKG was obtained, which showed NSR, rate 68 bpm.  In 2021, patient was unable to complete a coronary CT secondary to being unable to hold his breath and having uncontrollable coughing.  He also reports having severe hypotension at home after the test.  His lung disease will also not allow for a nuclear stress test.  Despite atypical nature of chest pain, will get an echo to evaluate heart function and the presence of wall motion abnormality to guide if further ischemic workup is required. 2. BP inconsistency right versus left arms.  Patient reports BP difference left versus right arms.  He states sometimes BP left arm will be 107/60 and BP in right arm will be 160/80.  BP is compared today right arm 110/60 left arm 102/60.  Will get carotid ultrasound to further assess. 3. Hyperlipidemia.  LDL March 2023 179, not at goal.  Patient has not taken prescribed lipid medication secondary to very serious problems with  OCD.  Attempted to discuss this with patient, however he became very anxious.  We agreed to revisit this conversation when he returns after testing. Patient has gone back to [facility] to evaluate for further lung volume reductions via various techniques documentation as below [facility]: 04/05/22  Mr. JARIN HALLQUIST is a 52 y.o. former smoker with a history of severe COPD/emphysema, hyperlipidemia, GERD, psoriasis, and OCD, who underwent bronchoscopic lung volume reduction with Zephyr endobronchial valve placement x 4 to the left upper lobe on 03/20/20. His post-procedural course was complicated by development of a left-sided pneumothorax noted in the recovery bay and required placement of two pigtail catheters to completely evacuate the space.  He had subjective improvement with partial atelectasis on chest CT, but has had subacute worsening of his symptoms, effectively feeling back to his pre-valve degree of symptoms. On subsequent chest CT, there was reexpansion of his left upper lobe, which likely reflects migration of at least one of the valves.  He underwent a revision bronchoscopy on 01/15/21 with replacement of the apico-posterior segment valves in the LUL. He thinks that he has had a backward slide in terms of his functional capacity and breathing following valve revision. He again struggles with  dyspnea while eating. His PFTs show at best marginal improvement. I recommended referral to thoracic surgery and he has been deemed a candidate for surgical lung volume reduction surgery on the right. Mr. Agosta is contemplative on whether he wants to go down the pathway of surgery. I understand his reservations and told him that I would be willing to have his most recent CT scan of the chest analyzed to see if endobronchial valves on the right could be considered. Notably Dr. Carmie Kanner placed the valves on the left which would suggest that the most optimal site was in fact the left upper lobe.  Unfortunately despite a valve revision there has been no consistent atelectasis nor has there been any notable spirometric improvement. If he is not a valve candidate on the right then I do think that it would be reasonable to remove the valves on the left as they have not demonstrated any clinical benefit to him.  I have his new interval CT of the chest and will upload it into the system for fissure analysis. I have discussed that we place valves in the most ideal place, which for him was the LUL. Thoracic surgery is willing to offer lung volume reduction on the right, which means the valves should be removed on the left, if possible. I do not think another valve revision on the left is the answer as that would be offering the same procedure and hoping for a different result. He wants to know if he is a candidate on the right, which I am willing to investigate further. If he is not, then I think removal of valves on the left would be indicated knowing that the amount of time the valves have been in the airway may make removal not possible, which he understands.  Regarding his eczema, this will be a risk benefit conversation with his dermatologist regarding treating his eczema knowing that there is a theoretical possibility of increased risk for infections. That is always a patient physician conversation and as long as the patient is willing to accept the risk and the physician deems the therapy/ intervention/ drug appropriate, then that is a reasonable practice pattern.  Plan: -re run fissure analysis and consider valves on the right and removal of valves on the left. - consider valves on right, versus removing valves on left vs proceeding to surgical lung volume reduction - Dermatology to have risk benefit conversation regarding theoretical risk of increased infection - repeat CT chest 1 year for small pulmonary nodule - RTC 1 month Fidela Juneau, MD Interventional Pulmonary   The patient continues  to have difficulty with his teeth is yet to achieve dental care concern Medicaid will pay for this also has severe psoriasis and is considering Skyrizi treatment patient also would like to travel to see his daughter in [place] is inquiring about portable oxygen concentrator.  He has increasing left inguinal hernia and they have been told to him that he cannot have this done until his dental situation is improved.       Review of Systems  Constitutional:  Negative for chills, diaphoresis, fever, malaise/fatigue and weight loss.  HENT:  Negative for congestion, hearing loss, nosebleeds, sore throat and tinnitus.   Eyes:  Negative for blurred vision, photophobia and redness.  Respiratory:  Positive for shortness of breath. Negative for cough, hemoptysis, sputum production, wheezing and stridor.   Cardiovascular:  Negative for chest pain, palpitations, orthopnea, claudication, leg swelling and PND.  Gastrointestinal:  Negative for abdominal pain,  blood in stool, constipation, diarrhea, heartburn, nausea and vomiting.  Genitourinary:  Negative for dysuria, flank pain, frequency, hematuria and urgency.  Musculoskeletal:  Negative for back pain, falls, joint pain, myalgias and neck pain.  Skin:  Positive for rash. Negative for itching.  Neurological:  Positive for weakness. Negative for dizziness, tingling, tremors, sensory change, speech change, focal weakness, seizures, loss of consciousness and headaches.  Endo/Heme/Allergies:  Negative for environmental allergies and polydipsia. Does not bruise/bleed easily.  Psychiatric/Behavioral:  Negative for depression, memory loss, substance abuse and suicidal ideas. The patient is not nervous/anxious and does not have insomnia.       Objective:     BP 100/65   Pulse 75   Ht 6\' 1"  (1.854 m)   Wt 178 lb 6.4 oz (80.9 kg)   SpO2 97%   BMI 23.54 kg/m   Pulse ox with exercise Normal and   normal at rest. Overnight sleep Oximetry poor d/t sleep apnea.  Cannot use CPAP Physical Exam Vitals reviewed.  Constitutional:      Appearance: Normal appearance. He is well-developed. He is not diaphoretic.  HENT:     Head: Normocephalic and atraumatic.     Nose: No nasal deformity, septal deviation, mucosal edema or rhinorrhea.     Right Sinus: No maxillary sinus tenderness or frontal sinus tenderness.     Left Sinus: No maxillary sinus tenderness or frontal sinus tenderness.     Mouth/Throat:     Pharynx: No oropharyngeal exudate.     Comments: Worn down molars in the back upper and lower jaws down to the level of the gumline with gum edema but no frank abscesses Eyes:     General: No scleral icterus.    Conjunctiva/sclera: Conjunctivae normal.     Pupils: Pupils are equal, round, and reactive to light.  Neck:     Thyroid: No thyromegaly.     Vascular: No carotid bruit or JVD.     Trachea: Trachea normal. No tracheal tenderness or tracheal deviation.  Cardiovascular:     Rate and Rhythm: Normal rate and regular rhythm.     Chest Wall: PMI is not displaced.     Pulses: Normal pulses. No decreased pulses.     Heart sounds: Normal heart sounds, S1 normal and S2 normal. Heart sounds not distant. No murmur heard.    No systolic murmur is present.     No diastolic murmur is present.     No friction rub. No gallop. No S3 or S4 sounds.  Pulmonary:     Effort: Pulmonary effort is normal. No tachypnea, accessory muscle usage or respiratory distress.     Breath sounds: No stridor. No decreased breath sounds, wheezing, rhonchi or rales.     Comments: Distant breath sounds Chest:     Chest wall: No tenderness.  Abdominal:     General: Bowel sounds are normal. There is no distension.     Palpations: Abdomen is soft. Abdomen is not rigid.     Tenderness: There is no abdominal tenderness. There is no guarding or rebound.     Hernia: A hernia is present.     Comments: Enlarging left inguinal hernia  Musculoskeletal:        General: Normal range of  motion.     Cervical back: Normal range of motion and neck supple. No edema, erythema or rigidity. No muscular tenderness. Normal range of motion.  Lymphadenopathy:     Head:     Right side of head: No submental  or submandibular adenopathy.     Left side of head: No submental or submandibular adenopathy.     Cervical: No cervical adenopathy.  Skin:    General: Skin is warm and dry.     Coloration: Skin is not pale.     Findings: Rash present.     Nails: There is no clubbing.     Comments: Psoriatic rash over elbows and scalp  Neurological:     Mental Status: He is alert and oriented to person, place, and time.     Sensory: No sensory deficit.  Psychiatric:        Speech: Speech normal.        Behavior: Behavior normal.      No results found for any visits on 05/13/22.    The 10-year ASCVD risk score (Arnett DK, et al., 2019) is: 4.5%    Assessment & Plan:   Problem List Items Addressed This Visit       Respiratory   COPD with emphysema (HCC)GOLD D  - Primary (Chronic)    Significant COPD with air trapping has been followed at [facility] in the giving consideration to surgical lung volume reduction on the right upper lobe plus or minus removing bronchial valves that are nonfunctional and left upper lobe however this will likely not occur      Relevant Medications   albuterol (VENTOLIN HFA) 108 (90 Base) MCG/ACT inhaler   Fluticasone-Umeclidin-Vilant (TRELEGY ELLIPTA) 200-62.5-25 MCG/ACT AEPB   ipratropium-albuterol (DUONEB) 0.5-2.5 (3) MG/3ML SOLN   Other Relevant Orders   For home use only DME oxygen   Chronic respiratory failure with hypoxia (HCC)    Continue oxygen therapy at night      Relevant Orders   For home use only DME oxygen     Digestive   Chronic periodontal disease    Referral to oral surgery was made      Inguinal hernia with obstruction without gangrene    Will refer back to general surgery once dental abscesses are improved      Embedded teeth  with impacted teeth    Referral to oral surgery      Relevant Orders   Ambulatory referral to Oral Maxillofacial Surgery     Musculoskeletal and Integument   Plaque psoriasis    Seeing dermatology giving consideration to [facility]        Other   Hyperlipidemia with target LDL less than 100 (Chronic)    Continue statin      Major depressive disorder, recurrent, moderate (HCC)    Monitoring off therapy     38 minutes spent going over multiple problems and patient education Return in about 4 months (around 09/13/2022) for chronic conditions.    Shan Levans, MD

## 2022-05-13 NOTE — Assessment & Plan Note (Signed)
Seeing dermatology giving consideration to [facility]

## 2022-05-13 NOTE — Patient Instructions (Signed)
Will see about a portable oxygen system for travel Medications refilled Return Dr Delford Field 4 months

## 2022-05-13 NOTE — Assessment & Plan Note (Signed)
Monitoring off therapy

## 2022-05-13 NOTE — Assessment & Plan Note (Signed)
Significant COPD with air trapping has been followed at [facility] in the giving consideration to surgical lung volume reduction on the right upper lobe plus or minus removing bronchial valves that are nonfunctional and left upper lobe however this will likely not occur

## 2022-05-13 NOTE — Assessment & Plan Note (Signed)
Continue statin. 

## 2022-05-13 NOTE — Progress Notes (Signed)
No concerns. 

## 2022-05-13 NOTE — Telephone Encounter (Signed)
Order for POC faxed to [employer]

## 2022-05-13 NOTE — Assessment & Plan Note (Signed)
Continue oxygen therapy at night 

## 2022-05-13 NOTE — Assessment & Plan Note (Signed)
Referral to oral surgery was made

## 2022-05-13 NOTE — Assessment & Plan Note (Signed)
Will refer back to general surgery once dental abscesses are improved

## 2022-05-14 ENCOUNTER — Encounter: Payer: Self-pay | Admitting: Critical Care Medicine

## 2022-05-22 ENCOUNTER — Other Ambulatory Visit (HOSPITAL_BASED_OUTPATIENT_CLINIC_OR_DEPARTMENT_OTHER): Payer: Self-pay

## 2022-05-25 ENCOUNTER — Other Ambulatory Visit (HOSPITAL_BASED_OUTPATIENT_CLINIC_OR_DEPARTMENT_OTHER): Payer: Self-pay

## 2022-05-28 DIAGNOSIS — J449 Chronic obstructive pulmonary disease, unspecified: Secondary | ICD-10-CM | POA: Diagnosis not present

## 2022-06-02 ENCOUNTER — Other Ambulatory Visit (HOSPITAL_BASED_OUTPATIENT_CLINIC_OR_DEPARTMENT_OTHER): Payer: Self-pay

## 2022-06-14 DIAGNOSIS — R0609 Other forms of dyspnea: Secondary | ICD-10-CM | POA: Diagnosis not present

## 2022-06-14 DIAGNOSIS — Z87891 Personal history of nicotine dependence: Secondary | ICD-10-CM | POA: Diagnosis not present

## 2022-06-14 DIAGNOSIS — J439 Emphysema, unspecified: Secondary | ICD-10-CM | POA: Diagnosis not present

## 2022-06-14 DIAGNOSIS — J432 Centrilobular emphysema: Secondary | ICD-10-CM | POA: Diagnosis not present

## 2022-07-04 ENCOUNTER — Other Ambulatory Visit: Payer: Self-pay

## 2022-07-04 ENCOUNTER — Emergency Department (HOSPITAL_BASED_OUTPATIENT_CLINIC_OR_DEPARTMENT_OTHER)
Admission: EM | Admit: 2022-07-04 | Discharge: 2022-07-05 | Disposition: A | Discharge status: Home / Self Care | Payer: Medicaid Other | Attending: Emergency Medicine | Admitting: Emergency Medicine

## 2022-07-04 ENCOUNTER — Emergency Department (HOSPITAL_BASED_OUTPATIENT_CLINIC_OR_DEPARTMENT_OTHER): Payer: Medicaid Other

## 2022-07-04 ENCOUNTER — Encounter (HOSPITAL_BASED_OUTPATIENT_CLINIC_OR_DEPARTMENT_OTHER): Payer: Self-pay

## 2022-07-04 DIAGNOSIS — J449 Chronic obstructive pulmonary disease, unspecified: Secondary | ICD-10-CM | POA: Insufficient documentation

## 2022-07-04 DIAGNOSIS — R1030 Lower abdominal pain, unspecified: Secondary | ICD-10-CM | POA: Diagnosis present

## 2022-07-04 DIAGNOSIS — K4090 Unilateral inguinal hernia, without obstruction or gangrene, not specified as recurrent: Secondary | ICD-10-CM

## 2022-07-04 LAB — CBC WITH DIFFERENTIAL/PLATELET
Abs Immature Granulocytes: 0.03 10*3/uL (ref 0.00–0.07)
Basophils Absolute: 0.1 10*3/uL (ref 0.0–0.1)
Basophils Relative: 0 %
Eosinophils Absolute: 0.1 10*3/uL (ref 0.0–0.5)
Eosinophils Relative: 1 %
HCT: 43.3 % (ref 39.0–52.0)
Hemoglobin: 14.8 g/dL (ref 13.0–17.0)
Immature Granulocytes: 0 %
Lymphocytes Relative: 25 %
Lymphs Abs: 2.8 10*3/uL (ref 0.7–4.0)
MCH: 31 pg (ref 26.0–34.0)
MCHC: 34.2 g/dL (ref 30.0–36.0)
MCV: 90.6 fL (ref 80.0–100.0)
Monocytes Absolute: 0.8 10*3/uL (ref 0.1–1.0)
Monocytes Relative: 7 %
Neutro Abs: 7.4 10*3/uL (ref 1.7–7.7)
Neutrophils Relative %: 67 %
Platelets: 323 10*3/uL (ref 150–400)
RBC: 4.78 MIL/uL (ref 4.22–5.81)
RDW: 11.9 % (ref 11.5–15.5)
WBC: 11.2 10*3/uL — ABNORMAL HIGH (ref 4.0–10.5)
nRBC: 0 % (ref 0.0–0.2)

## 2022-07-04 LAB — COMPREHENSIVE METABOLIC PANEL
ALT: 14 U/L (ref 0–44)
AST: 27 U/L (ref 15–41)
Albumin: 4.3 g/dL (ref 3.5–5.0)
Alkaline Phosphatase: 71 U/L (ref 38–126)
Anion gap: 9 (ref 5–15)
BUN: 16 mg/dL (ref 6–20)
CO2: 27 mmol/L (ref 22–32)
Calcium: 9.4 mg/dL (ref 8.9–10.3)
Chloride: 101 mmol/L (ref 98–111)
Creatinine, Ser: 1.14 mg/dL (ref 0.61–1.24)
GFR, Estimated: >60 mL/min (ref >60)
Glucose, Bld: 156 mg/dL — ABNORMAL HIGH (ref 70–99)
Potassium: 4.2 mmol/L (ref 3.5–5.1)
Sodium: 137 mmol/L (ref 135–145)
Total Bilirubin: 0.4 mg/dL (ref 0.3–1.2)
Total Protein: 7.7 g/dL (ref 6.5–8.1)

## 2022-07-04 LAB — LACTIC ACID, PLASMA: Lactic Acid, Venous: 1.5 mmol/L (ref 0.5–1.9)

## 2022-07-04 MED ORDER — FENTANYL CITRATE PF 50 MCG/ML IJ SOSY: 50.0000 ug | PREFILLED_SYRINGE | Freq: Once | INTRAMUSCULAR | Status: DC

## 2022-07-04 MED ORDER — IOHEXOL 300 MG/ML  SOLN: 100.0000 mL | Freq: Once | INTRAMUSCULAR | Status: DC | PRN

## 2022-07-04 MED ORDER — ONDANSETRON HCL 4 MG/2ML IJ SOLN: 4.0000 mg | Freq: Once | INTRAMUSCULAR | Status: DC

## 2022-07-04 NOTE — ED Provider Notes (Signed)
I was asked to come see patient in triage to help determine what workup he may need.  On my evaluation, patient is reporting a 30-minute history of left inguinal hernia bulge and pain that he cannot reduce.  He reports that for some time he had a hernia that comes and goes.  He reports that he is always able to reduce it.  He reports he coughed and a large bulge came on his left inguinal area.  He reports the pain is exquisite and severe and he is unable to push it back in.  He has had some nausea but no vomiting.  He denies any other trauma.  Denies any symptoms before this happened tonight.  He has never had surgery on this area.  On exam with a chaperone, he has a large bulge in his left inguinal area.  Testicles do not appear swollen or tender.  I attempted to reduce and push on it but patient screamed in pain.  He would not let me push.  Otherwise his abdomen was not focally tender and bowel sounds were appreciated.  Flanks and back also nontender.  Lungs were clear.  Patient was offered pain and nausea medicine but he did not want IV pain medicine initially.  I think he will likely need medicine to allow further reduction attempt.  Will order labs and a CT image to see but I am concerned about incarcerated inguinal hernia potentially.  He will need full evaluation when he gets to a room but will get the labs and CT imaging started.   Horton Ellithorpe, Canary Brim, MD 07/04/22 [phone]

## 2022-07-04 NOTE — ED Notes (Signed)
Pt would like to hold on any pain medication at this time.

## 2022-07-04 NOTE — ED Notes (Signed)
Dr. Rush Landmark at triage to assess the patient's hernia.

## 2022-07-04 NOTE — ED Triage Notes (Addendum)
Pt reports he has an inguinal hernia that he is usually able to push back in but tonight around 2200 it popped out after he coughed and he states it feels hard and is very painful.

## 2022-07-05 ENCOUNTER — Emergency Department (HOSPITAL_BASED_OUTPATIENT_CLINIC_OR_DEPARTMENT_OTHER): Payer: Medicaid Other

## 2022-07-05 DIAGNOSIS — J449 Chronic obstructive pulmonary disease, unspecified: Secondary | ICD-10-CM | POA: Diagnosis not present

## 2022-07-05 DIAGNOSIS — K4090 Unilateral inguinal hernia, without obstruction or gangrene, not specified as recurrent: Secondary | ICD-10-CM | POA: Diagnosis not present

## 2022-07-05 DIAGNOSIS — K5730 Diverticulosis of large intestine without perforation or abscess without bleeding: Secondary | ICD-10-CM | POA: Diagnosis not present

## 2022-07-05 MED ORDER — ONDANSETRON 4 MG PO TBDP: 4.0000 mg | ORAL_TABLET | Freq: Three times a day (TID) | ORAL | 0 refills | Status: AC | PRN

## 2022-07-05 NOTE — ED Provider Notes (Signed)
[place] EMERGENCY DEPARTMENT AT [facility] Provider Note  CSN: [id number] Arrival date & time: 07/04/22 2219  Chief Complaint(s) Inguinal Hernia  HPI Richard Fuentes is a 52 y.o. male with a past medical history listed below including chronic left inguinal hernia here for pain over the hernia site.  Patient has a history of COPD and emphysema with chronic cough.  He reports that while coughing this evening he felt the hernia popped out more and felt immediate pain.  The hernia itself was very tender to palpation and hard.  He attempted to dislodge it and reduce it himself but he was unsuccessful.  This prompted his visit.  Episode occurred approximately 30 minutes prior to arrival.  He was seen in triage and MSE process by Dr. Rush Landmark.  Labs and imaging ordered.  HPI  Past Medical History Past Medical History:  Diagnosis Date   Anxiety    Asthma    COPD (chronic obstructive pulmonary disease) (HCC)    Severe centrilobular and paraseptal bullous emphysema; Gold stage D; FEV1 and DLCO<30%.;  Currently undergoing transplant evaluation   Dyspnea    Environmental allergies    Esophagitis    Family history of adverse reaction to anesthesia    Paternal aunt had trouble waking   GERD (gastroesophageal reflux disease)    Headache    Hx of migraines    OCD (obsessive compulsive disorder)    OCD (obsessive compulsive disorder)    Pneumonia    Pneumothorax on left 03/20/2020   Psoriasis    Sleep apnea    Urticaria    Patient Active Problem List   Diagnosis Date Noted   Embedded teeth with impacted teeth 05/13/2022   Chronic respiratory failure with hypoxia (HCC) 05/13/2022   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) 03/30/2022   Major depressive disorder, recurrent, moderate (HCC) 06/15/2021   Allergic contact dermatitis due to metals 01/07/2020   OSA (obstructive sleep apnea) 09/13/2019   Hyperlipidemia with target LDL less than 100 08/20/2019   Family history  of premature coronary artery disease 08/20/2019   Anxiety 08/01/2019   Perennial and seasonal allergic rhinitis 07/24/2019   Recurrent urticaria 07/24/2019   History of penicillin allergy 07/24/2019   History of food allergy 07/24/2019   Plaque psoriasis 06/05/2019   Chronic periodontal disease 03/21/2019   COPD with emphysema (HCC)GOLD D  12/07/2017   Inguinal hernia with obstruction without gangrene 02/04/2017   Generalized anxiety disorder 12/07/2011   History of tobacco use 12/07/2011   Home Medication(s) Prior to Admission medications   Medication Sig Start Date End Date Taking? Authorizing Provider  albuterol (VENTOLIN HFA) 108 (90 Base) MCG/ACT inhaler Inhale 2 puffs into the lungs every 4 (four) hours as needed for wheezing or shortness of breath. 05/13/22   Storm Frisk, MD  desonide (DESOWEN) 0.05 % cream Apply 1 application  topically 2 (two) times daily as needed (psoriasis).    [provider]  EPINEPHrine 0.3 mg/0.3 mL IJ SOAJ injection Inject 0.3 mg into the muscle as needed for anaphylaxis. 06/04/21   Storm Frisk, MD  esomeprazole (NEXIUM) 40 MG capsule Take 1 capsule (40 mg total) by mouth 2 (two) times daily before a meal. 05/13/22   Storm Frisk, MD  Fluticasone-Umeclidin-Vilant (TRELEGY ELLIPTA) 200-62.5-25 MCG/ACT AEPB Inhale 1 puff into the lungs daily. 05/13/22   Storm Frisk, MD  hydrocortisone 2.5 % cream Apply 1 application topically 2 (two) times daily as needed (psoriasis). Patient not taking: Reported on 05/13/2022 05/20/20  [provider]  ipratropium-albuterol (DUONEB) 0.5-2.5 (3) MG/3ML SOLN Inhale 3 mLs into the lungs every 6 (six) hours as needed (shortness of breath). 05/13/22   Storm Frisk, MD  Multiple Vitamin (MULTIVITAMIN WITH MINERALS) TABS tablet Take 1 tablet by mouth daily.    [provider]  OXYGEN Inhale 2 L into the lungs continuous.    [provider]  SKYRIZI PEN 150 MG/ML SOAJ Inject into  the skin. Patient not taking: Reported on 05/13/2022 06/08/21   [provider]  clonazePAM (KLONOPIN) 0.5 MG tablet Take 1 tablet (0.5 mg total) by mouth 2 (two) times daily as needed for anxiety. 08/13/12 07/10/20  Moreno-Coll, Adlih, MD                                                                                                                                    Allergies Other, Wool alcohol [lanolin], and Shellfish allergy  Review of Systems Review of Systems As noted in HPI  Physical Exam Vital Signs  I have reviewed the triage vital signs BP 107/72   Pulse 75   Temp 97.6 F (36.4 C)   Resp 18   Ht 6\' 1"  (1.854 m)   Wt 79.4 kg   SpO2 94%   BMI 23.09 kg/m   Physical Exam Vitals reviewed.  Constitutional:      General: He is not in acute distress.    Appearance: He is well-developed. He is not diaphoretic.  HENT:     Head: Normocephalic and atraumatic.     Right Ear: External ear normal.     Left Ear: External ear normal.     Nose: Nose normal.     Mouth/Throat:     Mouth: Mucous membranes are moist.  Eyes:     General: No scleral icterus.    Conjunctiva/sclera: Conjunctivae normal.  Neck:     Trachea: Phonation normal.  Cardiovascular:     Rate and Rhythm: Normal rate and regular rhythm.  Pulmonary:     Effort: Pulmonary effort is normal. No respiratory distress.     Breath sounds: No stridor.  Abdominal:     General: There is no distension.     Hernia: A hernia is present. Hernia is present in the left inguinal area (tender).  Musculoskeletal:        General: Normal range of motion.     Cervical back: Normal range of motion.  Neurological:     Mental Status: He is alert and oriented to person, place, and time.  Psychiatric:        Behavior: Behavior normal.     ED Results and Treatments Labs (all labs ordered are listed, but only abnormal results are displayed) Labs Reviewed  CBC WITH DIFFERENTIAL/PLATELET - Abnormal; Notable for the following  components:      Result Value   WBC 11.2 (*)    All other components within normal limits  COMPREHENSIVE  METABOLIC PANEL - Abnormal; Notable for the following components:   Glucose, Bld 156 (*)    All other components within normal limits  LACTIC ACID, PLASMA  LACTIC ACID, PLASMA                                                                                                                         EKG  EKG Interpretation Date/Time:    Ventricular Rate:    PR Interval:    QRS Duration:    QT Interval:    QTC Calculation:   R Axis:      Text Interpretation:         Radiology CT ABDOMEN PELVIS WO CONTRAST  Result Date: 07/05/2022 CLINICAL DATA:  History of inguinal hernia with possible incarceration, initial encounter EXAM: CT ABDOMEN AND PELVIS WITHOUT CONTRAST TECHNIQUE: Multidetector CT imaging of the abdomen and pelvis was performed following the standard protocol without IV contrast. RADIATION DOSE REDUCTION: This exam was performed according to the departmental dose-optimization program which includes automated exposure control, adjustment of the mA and/or kV according to patient size and/or use of iterative reconstruction technique. COMPARISON:  None Available. FINDINGS: Lower chest: Emphysematous changes of lung bases are seen. No focal infiltrate is noted. Hepatobiliary: No focal liver abnormality is seen. No gallstones, gallbladder wall thickening, or biliary dilatation. Pancreas: Unremarkable. No pancreatic ductal dilatation or surrounding inflammatory changes. Spleen: Normal in size without focal abnormality. Adrenals/Urinary Tract: Adrenal glands are within normal limits. Kidneys show a normal appearance. No calculi are identified. No obstructive changes are seen. The bladder is well distended. Stomach/Bowel: Scattered diverticular change of the colon is noted. No evidence of diverticulitis is seen. The appendix is within normal limits. Small bowel and stomach are unremarkable.  Vascular/Lymphatic: Aortic atherosclerosis. No enlarged abdominal or pelvic lymph nodes. Reproductive: Prostate is unremarkable. Other: A large fat containing left inguinal hernia is noted. No bowel is noted within although some mild edematous changes are seen which may represent some vascular compromise. Musculoskeletal: Degenerative changes of lumbar spine are noted. IMPRESSION: Large fat containing left inguinal hernia with some edema within although no bowel is noted within. The edema may be related to some local inflammatory change and vascular compromise. Diverticulosis without diverticulitis. Aortic Atherosclerosis (ICD10-I70.0) and Emphysema (ICD10-J43.9). Electronically Signed   By: Alcide Clever M.D.   On: 07/05/2022 00:57    Medications Ordered in ED Medications  fentaNYL (SUBLIMAZE) injection 50 mcg (has no administration in time range)  ondansetron (ZOFRAN) injection 4 mg (has no administration in time range)  iohexol (OMNIPAQUE) 300 MG/ML solution 100 mL (has no administration in time range)   Procedures Procedures  (including critical care time) Medical Decision Making / ED Course   Medical Decision Making Amount and/or Complexity of Data Reviewed Labs: ordered. Decision-making details documented in ED Course. Radiology: ordered and independent interpretation performed. Decision-making details documented in ED Course.    Left inguinal hernia. Will assess for obstruction or strangulation. Ice pack applied. Able to  self reduce with ice pack.   Patient declined pain medicines.  CBC with mild leukocytosis.  No anemia Metabolic panel without significant electrolyte derangements or renal sufficiency.  No bili obstruction or pancreatitis. CT scan w/o intestinal hernia. Only fat containing hernia. Likely reduced prior to CT. No longer having pain. Abd benign.    Final Clinical Impression(s) / ED Diagnoses Final diagnoses:  Left inguinal hernia   The patient appears reasonably  screened and/or stabilized for discharge and I doubt any other medical condition or other [facility] requiring further screening, evaluation, or treatment in the ED at this time. I have discussed the findings, Dx and Tx plan with the patient/family who expressed understanding and agree(s) with the plan. Discharge instructions discussed at length. The patient/family was given strict return precautions who verbalized understanding of the instructions. No further questions at time of discharge.  Disposition: Discharge  Condition: Good  ED Discharge Orders     None        Follow Up: Storm Frisk, MD [address] [place] [address] [phone]  Call  as needed    This chart was dictated using voice recognition software.  Despite best efforts to proofread,  errors can occur which can change the documentation meaning.    Nira Conn, MD 07/05/22 [phone]

## 2022-07-27 DIAGNOSIS — J9611 Chronic respiratory failure with hypoxia: Secondary | ICD-10-CM | POA: Diagnosis not present

## 2022-07-27 DIAGNOSIS — K4020 Bilateral inguinal hernia, without obstruction or gangrene, not specified as recurrent: Secondary | ICD-10-CM | POA: Diagnosis not present

## 2022-08-05 DIAGNOSIS — K4020 Bilateral inguinal hernia, without obstruction or gangrene, not specified as recurrent: Secondary | ICD-10-CM | POA: Diagnosis not present

## 2022-08-05 DIAGNOSIS — J449 Chronic obstructive pulmonary disease, unspecified: Secondary | ICD-10-CM | POA: Diagnosis not present

## 2022-08-12 ENCOUNTER — Other Ambulatory Visit (HOSPITAL_BASED_OUTPATIENT_CLINIC_OR_DEPARTMENT_OTHER): Payer: Self-pay

## 2022-08-19 ENCOUNTER — Other Ambulatory Visit (HOSPITAL_BASED_OUTPATIENT_CLINIC_OR_DEPARTMENT_OTHER): Payer: Self-pay

## 2022-08-25 ENCOUNTER — Encounter: Payer: Self-pay | Admitting: Critical Care Medicine

## 2022-09-05 DIAGNOSIS — J449 Chronic obstructive pulmonary disease, unspecified: Secondary | ICD-10-CM | POA: Diagnosis not present

## 2022-09-15 ENCOUNTER — Other Ambulatory Visit (HOSPITAL_BASED_OUTPATIENT_CLINIC_OR_DEPARTMENT_OTHER): Payer: Self-pay

## 2022-09-15 ENCOUNTER — Other Ambulatory Visit: Payer: Self-pay

## 2022-09-15 ENCOUNTER — Encounter: Payer: Self-pay | Admitting: Critical Care Medicine

## 2022-09-15 ENCOUNTER — Ambulatory Visit: Payer: Medicaid Other | Attending: Critical Care Medicine | Admitting: Critical Care Medicine

## 2022-09-15 VITALS — BP 107/69 | HR 73 | Wt 174.8 lb

## 2022-09-15 DIAGNOSIS — K4031 Unilateral inguinal hernia, with obstruction, without gangrene, recurrent: Secondary | ICD-10-CM

## 2022-09-15 DIAGNOSIS — K011 Impacted teeth: Secondary | ICD-10-CM

## 2022-09-15 DIAGNOSIS — J431 Panlobular emphysema: Secondary | ICD-10-CM

## 2022-09-15 DIAGNOSIS — K010 Embedded teeth: Secondary | ICD-10-CM | POA: Diagnosis not present

## 2022-09-15 MED ORDER — TRELEGY ELLIPTA 200-62.5-25 MCG/ACT IN AEPB
1.0000 | INHALATION_SPRAY | Freq: Every day | RESPIRATORY_TRACT | 3 refills | Status: DC
  Filled 2022-09-16: qty 60, 30d supply, fill #0
  Filled 2022-12-06 – 2022-12-22 (×2): qty 60, 30d supply, fill #1

## 2022-09-15 MED ORDER — METHOCARBAMOL 500 MG PO TABS
500.0000 mg | ORAL_TABLET | Freq: Four times a day (QID) | ORAL | 2 refills | Status: DC | PRN
  Filled 2022-09-15: qty 90, 23d supply, fill #0
  Filled 2022-12-06: qty 90, 23d supply, fill #1

## 2022-09-15 MED ORDER — VENTOLIN HFA 108 (90 BASE) MCG/ACT IN AERS
2.0000 | INHALATION_SPRAY | RESPIRATORY_TRACT | 2 refills | Status: DC | PRN
  Filled 2022-09-15: qty 126, 175d supply, fill #0
  Filled 2022-12-06 – 2022-12-22 (×2): qty 54, 75d supply, fill #0

## 2022-09-15 MED ORDER — IPRATROPIUM-ALBUTEROL 0.5-2.5 (3) MG/3ML IN SOLN
3.0000 mL | Freq: Four times a day (QID) | RESPIRATORY_TRACT | 0 refills | Status: DC | PRN
  Filled 2022-09-15: qty 360, 30d supply, fill #0

## 2022-09-15 MED ORDER — ESOMEPRAZOLE MAGNESIUM 40 MG PO CPDR
40.0000 mg | DELAYED_RELEASE_CAPSULE | Freq: Two times a day (BID) | ORAL | 2 refills | Status: AC
  Filled 2022-09-15: qty 60, 30d supply, fill #0

## 2022-09-15 NOTE — Assessment & Plan Note (Signed)
Will refer to [facility] for further follow-up as I am retiring

## 2022-09-15 NOTE — Patient Instructions (Signed)
Medication refilled I recommend local extraction of critical teeth Antibiotic should be given before and after surgery Let surgeon at [facility] know when teeth are gone for hernia surgery Lung clinic referral locally will be made as Dr Delford Field is retiring Return at this clinic for primary care new provider in 4 months

## 2022-09-15 NOTE — Assessment & Plan Note (Signed)
Cannot occur until dental status is improved

## 2022-09-15 NOTE — Assessment & Plan Note (Signed)
Patient will use local dentist for extractions and then pursue inguinal hernia repair

## 2022-09-15 NOTE — Progress Notes (Signed)
Established Patient Office Visit  Subjective   Patient ID: Richard Fuentes, male    DOB: 11-Nov-1970  Age: 52 y.o. MRN: [id number]  Chief Complaint  Patient presents with   Hernia    01/07/22 Since last visit patient has been to [facility] they are recommending a lung volume reduction surgery see below [facility] 11/2021 Richard Fuentes [id number] is a 52 y.o. male referred to evaluate for lung volume reduction surgery (LVRS). Patient is a former, 32 pack-year smoker who was diagnosed with COPD/emphysema I 2013. He developed Covid in January 2021, after which his symptoms worsened to the point patient was struggling to perform ADLs and could not ambulate more than 100 feet. Labwork done 2021 at [facility] was negative for alpha-1 anti-trypsin. He consulted with [facility] 02/2020. 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 Transplant evaluation was closed.  Patient underwent endobronchial valve placement on 03/20/2020 with Dr. Waldon Merl, and valve replacement on 01/15/2021 with Dr. Fidela Juneau. CT Chest on 03/10/2021 showed an increase in LUL bandlike atelectasis with volume loss. He returned to see Dr. Nada Boozer on 08/10/2021. Patient reports that, while his dyspnea has slightly improved following valve revision in 01/2021, improvement has not been as notable as he experienced following initial valve placement. He reports dyspnea is problematic when eating. Patient inquired about additional valve revisions; Dr. Nada Boozer reviewed rationale for not revising the valves any further. Dr. Nada Boozer has referred patient to Thoracic Surgeon, Dr. Karie Georges, to evaluate for LVRS.  Patient participated in pulmonary rehabilitation at [facility] in 07/2020, and re-enrolled in pulmonary rehab in 2023. BMI is 25.28. '  Dr. Rhona Raider reviewed patient case and indicated patient may be a candidate for unilateral LVRS on  the right. However, have complete pulmonary rehab again, or participating in it.  He presents today with VQ scan and clinic for consult.  Today, pt notes that he is better since starting rehab but still has poor exercise tolerance. He notes that he is seeking treatment of his OCD.  Smoking status: former smoker; quit 2020. Hx 2ppd x 34 years = 68 pack year. Former Advertising account planner use; quit 2020. Hx marijuana use; quit 1987.  Anticoagulation/Antiplatelet: none  PMH also includes: obsessive compulsive disorder, anxiety , OSA on CPAP, chronic periodontal disease. He consulted with [facility], Dr. Dr. Kathrine Haddock, on 05/27/2021. Plan is for full mouth extractions with dentures versus referral to [facility], Dr. Joni Reining Messenger, for comprehensive dental evaluation for possible multiple extractions, scaling and root planing, and multiple restorations. Richard Fuentes is a 52 y.o. male with a history of bullous emphysema. Evaluation today indicates he is an appropriate surgical candidate for unilateral LVRS.  Results of the above studies were discussed with the patient. The nature, purpose, risks, benefits and alternatives regarding Right VATS LVRS were explained, and the patient was given the opportunity to ask questions and have these questions answered to their satisfaction. He offered that we would work with Dr. Willeen Niece for him to have a full mouth extraction at the time of the LVRS. He wished to consider his options and get back to [place]. Our office contact information was provided to the patient.   Patient also has ongoing psoriasis was supposed to start [place] but held off.  He needs to have his teeth extracted and a left hernia repaired in the inguinal area.  Asked questions on whether I  am primary and healthy Blue we have documented he is we will get him further resources because he does not seem to be able to sign up for [place].  Blood pressure on arrival good 100/64.  He continues to sleep  very poorly.  The patient had sleep study showing desaturation and OSA he cannot use cpap ,uses Oxygen 2L   05/13/22 Patient seen in return follow-up last contact was in February when we did a phone visit encouraging him to get follow-up dental care which she is yet to achieve. The patient was also seen in February by cardiology blood pressures and evaluations as below Cards :02/2022 1. Chest pain/DOE.  Patient reports a month-long history of left-sided stabbing chest pain that is associated with dyspnea on exertion.  Secondary to patient's lung disease he becomes dyspneic with very little activity such as getting dressed or taking a shower.  Pain will linger for up to 2 hours after he gets his breathing under control.  He recently went to [facility] and discovered one of the failed endobronchial valves is migrating to the bottom of his lung.  He is unsure if this is causing his pain.  He was recently started on a steroid Dosepak which has improved his pain.  Patient reported chest pain during exam so EKG was obtained, which showed NSR, rate 68 bpm.  In 2021, patient was unable to complete a coronary CT secondary to being unable to hold his breath and having uncontrollable coughing.  He also reports having severe hypotension at home after the test.  His lung disease will also not allow for a nuclear stress test.  Despite atypical nature of chest pain, will get an echo to evaluate heart function and the presence of wall motion abnormality to guide if further ischemic workup is required. 2. BP inconsistency right versus left arms.  Patient reports BP difference left versus right arms.  He states sometimes BP left arm will be 107/60 and BP in right arm will be 160/80.  BP is compared today right arm 110/60 left arm 102/60.  Will get carotid ultrasound to further assess. 3. Hyperlipidemia.  LDL March 2023 179, not at goal.  Patient has not taken prescribed lipid medication secondary to very serious problems with OCD.   Attempted to discuss this with patient, however he became very anxious.  We agreed to revisit this conversation when he returns after testing. Patient has gone back to [facility] to evaluate for further lung volume reductions via various techniques documentation as below [facility]: 04/05/22  Mr. Richard Fuentes is a 52 y.o. former smoker with a history of severe COPD/emphysema, hyperlipidemia, GERD, psoriasis, and OCD, who underwent bronchoscopic lung volume reduction with Zephyr endobronchial valve placement x 4 to the left upper lobe on 03/20/20. His post-procedural course was complicated by development of a left-sided pneumothorax noted in the recovery bay and required placement of two pigtail catheters to completely evacuate the space.  He had subjective improvement with partial atelectasis on chest CT, but has had subacute worsening of his symptoms, effectively feeling back to his pre-valve degree of symptoms. On subsequent chest CT, there was reexpansion of his left upper lobe, which likely reflects migration of at least one of the valves.  He underwent a revision bronchoscopy on 01/15/21 with replacement of the apico-posterior segment valves in the LUL. He thinks that he has had a backward slide in terms of his functional capacity and breathing following valve revision. He again struggles with dyspnea while  eating. His PFTs show at best marginal improvement. I recommended referral to thoracic surgery and he has been deemed a candidate for surgical lung volume reduction surgery on the right. Richard Fuentes is contemplative on whether he wants to go down the pathway of surgery. I understand his reservations and told him that I would be willing to have his most recent CT scan of the chest analyzed to see if endobronchial valves on the right could be considered. Notably Dr. Carmie Kanner placed the valves on the left which would suggest that the most optimal site was in fact the left upper lobe. Unfortunately  despite a valve revision there has been no consistent atelectasis nor has there been any notable spirometric improvement. If he is not a valve candidate on the right then I do think that it would be reasonable to remove the valves on the left as they have not demonstrated any clinical benefit to him.  I have his new interval CT of the chest and will upload it into the system for fissure analysis. I have discussed that we place valves in the most ideal place, which for him was the LUL. Thoracic surgery is willing to offer lung volume reduction on the right, which means the valves should be removed on the left, if possible. I do not think another valve revision on the left is the answer as that would be offering the same procedure and hoping for a different result. He wants to know if he is a candidate on the right, which I am willing to investigate further. If he is not, then I think removal of valves on the left would be indicated knowing that the amount of time the valves have been in the airway may make removal not possible, which he understands.  Regarding his eczema, this will be a risk benefit conversation with his dermatologist regarding treating his eczema knowing that there is a theoretical possibility of increased risk for infections. That is always a patient physician conversation and as long as the patient is willing to accept the risk and the physician deems the therapy/ intervention/ drug appropriate, then that is a reasonable practice pattern.  Plan: -re run fissure analysis and consider valves on the right and removal of valves on the left. - consider valves on right, versus removing valves on left vs proceeding to surgical lung volume reduction - Dermatology to have risk benefit conversation regarding theoretical risk of increased infection - repeat CT chest 1 year for small pulmonary nodule - RTC 1 month Fidela Juneau, MD Interventional Pulmonary   The patient continues to have  difficulty with his teeth is yet to achieve dental care concern Medicaid will pay for this also has severe psoriasis and is considering Skyrizi treatment patient also would like to travel to see his daughter in [place] is inquiring about portable oxygen concentrator.  He has increasing left inguinal hernia and they have been told to him that he cannot have this done until his dental situation is improved.  09/15/22 This patient is seen in return follow-up for severe COPD  Patient is followed at [facility] with prior left upper lobe pulmonary valve placement for lung volume reduction.  The valves are not working and the lung well partially collapsed is partially reinflated but not causing issues.  He has been requested to undergo potential thoracic right upper lobe lung volume reduction as pulmonary valves are not indicated because he has an incomplete fissure in the right lung.  The patient has  a severe left inguinal hernia that is worsening and is taking time to reduce.  He has been in the emergency room with this at least once.  The barriers he is gone to have multiple teeth pulled before he can have a mesh placed in the left inguinal hernia.  Patient remains dyspneic on exertion at rest.  He is lost some weight because did not eat well.  He has been to a local dentist who is considering pulling at least 9 teeth. Pulmonary wants to repeat a CT of his chest in October because of a right lower lobe lung nodule      Review of Systems  Constitutional:  Positive for weight loss. Negative for chills, diaphoresis, fever and malaise/fatigue.  HENT:  Negative for congestion, hearing loss, nosebleeds, sore throat and tinnitus.   Eyes:  Negative for blurred vision, photophobia and redness.  Respiratory:  Positive for shortness of breath. Negative for cough, hemoptysis, sputum production, wheezing and stridor.   Cardiovascular:  Negative for chest pain, palpitations, orthopnea, claudication, leg  swelling and PND.  Gastrointestinal:  Negative for abdominal pain, blood in stool, constipation, diarrhea, heartburn, nausea and vomiting.  Genitourinary:  Negative for dysuria, flank pain, frequency, hematuria and urgency.  Musculoskeletal:  Negative for back pain, falls, joint pain, myalgias and neck pain.  Skin:  Positive for rash. Negative for itching.  Neurological:  Positive for weakness. Negative for dizziness, tingling, tremors, sensory change, speech change, focal weakness, seizures, loss of consciousness and headaches.  Endo/Heme/Allergies:  Negative for environmental allergies and polydipsia. Does not bruise/bleed easily.  Psychiatric/Behavioral:  Negative for depression, memory loss, substance abuse and suicidal ideas. The patient is not nervous/anxious and does not have insomnia.       Objective:     BP 107/69 (BP Location: Right Arm, Patient Position: Sitting, Cuff Size: Normal)   Pulse 73   Wt 174 lb 12.8 oz (79.3 kg)   SpO2 97%   BMI 23.06 kg/m   Pulse ox with exercise Normal and   normal at rest. Overnight sleep Oximetry poor d/t sleep apnea. Cannot use CPAP Physical Exam Vitals reviewed.  Constitutional:      Appearance: Normal appearance. He is well-developed. He is not diaphoretic.  HENT:     Head: Normocephalic and atraumatic.     Nose: No nasal deformity, septal deviation, mucosal edema or rhinorrhea.     Right Sinus: No maxillary sinus tenderness or frontal sinus tenderness.     Left Sinus: No maxillary sinus tenderness or frontal sinus tenderness.     Mouth/Throat:     Pharynx: No oropharyngeal exudate.     Comments: Worn down molars in the back upper and lower jaws down to the level of the gumline with gum edema but no frank abscesses Eyes:     General: No scleral icterus.    Conjunctiva/sclera: Conjunctivae normal.     Pupils: Pupils are equal, round, and reactive to light.  Neck:     Thyroid: No thyromegaly.     Vascular: No carotid bruit or JVD.      Trachea: Trachea normal. No tracheal tenderness or tracheal deviation.  Cardiovascular:     Rate and Rhythm: Normal rate and regular rhythm.     Chest Wall: PMI is not displaced.     Pulses: Normal pulses. No decreased pulses.     Heart sounds: Normal heart sounds, S1 normal and S2 normal. Heart sounds not distant. No murmur heard.    No systolic murmur is present.  No diastolic murmur is present.     No friction rub. No gallop. No S3 or S4 sounds.  Pulmonary:     Effort: Pulmonary effort is normal. No tachypnea, accessory muscle usage or respiratory distress.     Breath sounds: No stridor. No decreased breath sounds, wheezing, rhonchi or rales.     Comments: Distant breath sounds Chest:     Chest wall: No tenderness.  Abdominal:     General: Bowel sounds are normal. There is no distension.     Palpations: Abdomen is soft. Abdomen is not rigid.     Tenderness: There is no abdominal tenderness. There is no guarding or rebound.     Hernia: A hernia is present.     Comments: Enlarging left inguinal hernia  Musculoskeletal:        General: Normal range of motion.     Cervical back: Normal range of motion and neck supple. No edema, erythema or rigidity. No muscular tenderness. Normal range of motion.  Lymphadenopathy:     Head:     Right side of head: No submental or submandibular adenopathy.     Left side of head: No submental or submandibular adenopathy.     Cervical: No cervical adenopathy.  Skin:    General: Skin is warm and dry.     Coloration: Skin is not pale.     Findings: Rash present.     Nails: There is no clubbing.     Comments: Psoriatic rash over elbows and scalp  Neurological:     Mental Status: He is alert and oriented to person, place, and time.     Sensory: No sensory deficit.  Psychiatric:        Speech: Speech normal.        Behavior: Behavior normal.      No results found for any visits on 09/15/22.    The 10-year ASCVD risk score (Arnett DK, et  al., 2019) is: 5%    Assessment & Plan:   Problem List Items Addressed This Visit       Respiratory   COPD with emphysema (HCC)GOLD D  - Primary (Chronic)    Will refer to [facility] for further follow-up as I am retiring      Relevant Medications   Fluticasone-Umeclidin-Vilant (TRELEGY ELLIPTA) 200-62.5-25 MCG/ACT AEPB   albuterol (VENTOLIN HFA) 108 (90 Base) MCG/ACT inhaler   ipratropium-albuterol (DUONEB) 0.5-2.5 (3) MG/3ML SOLN   Other Relevant Orders   Ambulatory referral to Pulmonology     Digestive   Inguinal hernia with obstruction without gangrene    Cannot occur until dental status is improved      Embedded teeth with impacted teeth    Patient will use local dentist for extractions and then pursue inguinal hernia repair      38 minutes spent going over multiple problems and patient education Return in about 4 months (around 01/15/2023) for chronic conditions, copd.    Shan Levans, MD

## 2022-09-16 ENCOUNTER — Other Ambulatory Visit (HOSPITAL_BASED_OUTPATIENT_CLINIC_OR_DEPARTMENT_OTHER): Payer: Self-pay

## 2022-10-04 ENCOUNTER — Encounter: Payer: Self-pay | Admitting: Critical Care Medicine

## 2022-10-05 DIAGNOSIS — J449 Chronic obstructive pulmonary disease, unspecified: Secondary | ICD-10-CM | POA: Diagnosis not present

## 2022-11-01 DIAGNOSIS — R911 Solitary pulmonary nodule: Secondary | ICD-10-CM | POA: Diagnosis not present

## 2022-11-01 DIAGNOSIS — J9811 Atelectasis: Secondary | ICD-10-CM | POA: Diagnosis not present

## 2022-11-01 DIAGNOSIS — J432 Centrilobular emphysema: Secondary | ICD-10-CM | POA: Diagnosis not present

## 2022-11-05 DIAGNOSIS — J449 Chronic obstructive pulmonary disease, unspecified: Secondary | ICD-10-CM | POA: Diagnosis not present

## 2022-12-05 DIAGNOSIS — J449 Chronic obstructive pulmonary disease, unspecified: Secondary | ICD-10-CM | POA: Diagnosis not present

## 2022-12-06 ENCOUNTER — Other Ambulatory Visit (HOSPITAL_BASED_OUTPATIENT_CLINIC_OR_DEPARTMENT_OTHER): Payer: Self-pay

## 2022-12-06 ENCOUNTER — Other Ambulatory Visit: Payer: Self-pay

## 2022-12-07 ENCOUNTER — Other Ambulatory Visit: Payer: Self-pay

## 2022-12-07 ENCOUNTER — Other Ambulatory Visit (HOSPITAL_BASED_OUTPATIENT_CLINIC_OR_DEPARTMENT_OTHER): Payer: Self-pay

## 2022-12-17 ENCOUNTER — Other Ambulatory Visit (HOSPITAL_BASED_OUTPATIENT_CLINIC_OR_DEPARTMENT_OTHER): Payer: Self-pay

## 2022-12-19 ENCOUNTER — Other Ambulatory Visit: Payer: Self-pay | Admitting: Critical Care Medicine

## 2022-12-19 ENCOUNTER — Encounter: Payer: Self-pay | Admitting: Critical Care Medicine

## 2022-12-19 DIAGNOSIS — J3089 Other allergic rhinitis: Secondary | ICD-10-CM

## 2022-12-19 DIAGNOSIS — L400 Psoriasis vulgaris: Secondary | ICD-10-CM

## 2022-12-19 DIAGNOSIS — G4733 Obstructive sleep apnea (adult) (pediatric): Secondary | ICD-10-CM

## 2022-12-19 DIAGNOSIS — J432 Centrilobular emphysema: Secondary | ICD-10-CM

## 2022-12-19 DIAGNOSIS — F411 Generalized anxiety disorder: Secondary | ICD-10-CM

## 2022-12-19 DIAGNOSIS — F331 Major depressive disorder, recurrent, moderate: Secondary | ICD-10-CM

## 2022-12-19 DIAGNOSIS — J9611 Chronic respiratory failure with hypoxia: Secondary | ICD-10-CM

## 2022-12-19 DIAGNOSIS — G43109 Migraine with aura, not intractable, without status migrainosus: Secondary | ICD-10-CM

## 2022-12-19 DIAGNOSIS — E785 Hyperlipidemia, unspecified: Secondary | ICD-10-CM

## 2022-12-19 NOTE — Progress Notes (Signed)
IM referral for pcp

## 2022-12-22 ENCOUNTER — Ambulatory Visit (HOSPITAL_BASED_OUTPATIENT_CLINIC_OR_DEPARTMENT_OTHER): Payer: Medicaid Other | Admitting: Pulmonary Disease

## 2022-12-22 ENCOUNTER — Other Ambulatory Visit (HOSPITAL_BASED_OUTPATIENT_CLINIC_OR_DEPARTMENT_OTHER): Payer: Self-pay

## 2022-12-22 ENCOUNTER — Encounter (HOSPITAL_BASED_OUTPATIENT_CLINIC_OR_DEPARTMENT_OTHER): Payer: Self-pay | Admitting: Pulmonary Disease

## 2022-12-22 VITALS — BP 92/58 | HR 76 | Resp 16 | Ht 73.0 in | Wt 176.3 lb

## 2022-12-22 DIAGNOSIS — J441 Chronic obstructive pulmonary disease with (acute) exacerbation: Secondary | ICD-10-CM

## 2022-12-22 DIAGNOSIS — J439 Emphysema, unspecified: Secondary | ICD-10-CM

## 2022-12-22 NOTE — Progress Notes (Signed)
Subjective:    Patient ID: Richard Fuentes, male    DOB: 01-24-1970, 52 y.o.   MRN: [id number]  HPI  52 year old heavy ex-smoker presents to establish care for severe emphysema Former patient of Dr. Delford Field He has been following up at [facility] for the past few years, I performed a detailed chart review of his records from [place]. He reports high symptom burden with dyspnea on exertion on minimal activity and chronic cough He smoked 2 packs/day until he quit in 2020, 70 pack yrs  03/2020 underwent bronchoscopic lung volume reduction with Zephyr endobronchial valve placement x 4 to the left upper lobe,complicated by left-sided pneumothorax   01/15/21  replacement of the apico-posterior segment valves in the LUL.   He has been evaluated by [facility] and thoracic surgery.  Felt not to be candidate for repeat endobronchial valves and felt to be a candidate for LV RS but he is hesitant to proceed.  He is contemplating other elective surgeries finished such as teeth extraction and a hernia repair.  Plan is for endobronchial valve removal after he completes the surgeries.  He has undergone transplant evaluation for turndown due to his severe OCD and absence of 2 caregivers.  At any rate he does not want to pursue transplant  He has completed pulmonary rehab in the past and now does his own exercise program He has several issues to discuss today -Plan for exacerbations -Refills -Clearance for surgery -Need for oxygen -Treatment of OSA including inspire -Weight loss, he has not undergone colonoscopy, CT abdomen/pelvis 07/2022 showed diverticulosis  Blood pressure slight low today but he denies dizziness or headache. On ambulation oxygen saturation dropped from 96 to 91%, heart rate increased from 76-1 05  Discussed the use of AI scribe software for clinical note transcription with the patient, who gave verbal consent to proceed.  History of Present Illness   The patient, with a history of emphysema and  26% lung function, presents with a history of two procedures, including the placement of bronchial valves. The patient reports that the valves have migrated and are no longer effective. The patient has a history of smoking for 34 years but quit in 2020. The patient also reports a history of asthma treatment for years before the diagnosis of emphysema was made. The patient has frequent exacerbations of emphysema, which are managed with Trelegy. The patient also reports a hernia and needs dental work. The patient has a history of OCD and anxiety. The patient is considering lung volume reduction surgery but is hesitant due to the potential risks and complications. The patient also reports a significant weight loss of 38 pounds within the year.       Significant tests/ events reviewed  CT chest 10/2022 right lower lobe nodule resolved, severe apical emphysema left upper lobe bronchial valve present, migrated 07/2019 alpha-1 antitrypsin negative  04/2019 PFTs FEV1 1.25/30%, DLCO 9.87/31%  Past Medical History:  Diagnosis Date   Anxiety    Asthma    COPD (chronic obstructive pulmonary disease) (HCC)    Severe centrilobular and paraseptal bullous emphysema; Gold stage D; FEV1 and DLCO<30%.;  Currently undergoing transplant evaluation   Diabetes (HCC)    Dyspnea    Emphysema of lung (HCC)    Environmental allergies    Esophagitis    Family history of adverse reaction to anesthesia    Paternal aunt had trouble waking   GERD (gastroesophageal reflux disease)    Headache    Hx of migraines  OCD (obsessive compulsive disorder)    OCD (obsessive compulsive disorder)    Pneumonia    Pneumothorax on left 03/20/2020   Psoriasis    Sleep apnea    Urticaria     Past Surgical History:  Procedure Laterality Date   Endobronchial valve Left 03/20/2020   L[facility] at [facility]   KNEE SURGERY Right 2002   arthroscopy and cartilidge removed   KNEE SURGERY Left 1987   Ligaments removed and screw in place     Allergies  Allergen Reactions   Other Anaphylaxis    Wool   Wool Alcohol [Lanolin] Hives   Penicillins Other (See Comments)    Childhood allergy  Has patient had a PCN reaction causing immediate rash, facial/tongue/throat swelling, SOB or lightheadedness with hypotension: n/a Has patient had a PCN reaction causing severe rash involving mucus membranes or skin necrosis: n/a Has patient had a PCN reaction that required hospitalization: n/a Has patient had a PCN reaction occurring within the last 10 years: n/a If all of the above answers are "NO", then may proceed with Cephalosporin use.    Shellfish Allergy     Unknown reaction   Social History   Socioeconomic History   Marital status: Significant Other    Spouse name: Not on file   Number of children: Not on file   Years of education: Not on file   Highest education level: GED or equivalent  Occupational History   Not on file  Tobacco Use   Smoking status: Former    Current packs/day: 0.00    Average packs/day: 1 pack/day for 30.0 years (30.0 ttl pk-yrs)    Types: Cigarettes    Start date: 01/20/1988    Quit date: 01/19/2018    Years since quitting: 4.9    Passive exposure: Past   Smokeless tobacco: Never  Vaping Use   Vaping status: Never Used  Substance and Sexual Activity   Alcohol use: No   Drug use: No   Sexual activity: Not Currently  Other Topics Concern   Not on file  Social History Narrative   Not on file   Social Drivers of Health   Financial Resource Strain: High Risk (05/12/2022)   Overall Financial Resource Strain (CARDIA)    Difficulty of Paying Living Expenses: Hard  Food Insecurity: No Food Insecurity (05/12/2022)   Hunger Vital Sign    Worried About Running Out of Food in the Last Year: Never true    Ran Out of Food in the Last Year: Never true  Transportation Needs: Unmet Transportation Needs (05/12/2022)   PRAPARE - Transportation    Lack of Transportation (Medical): Yes    Lack of  Transportation (Non-Medical): Yes  Physical Activity: Insufficiently Active (05/12/2022)   Exercise Vital Sign    Days of Exercise per Week: 3 days    Minutes of Exercise per Session: 10 min  Stress: Stress Concern Present (05/12/2022)   [employer] of Occupational Health - Occupational Stress Questionnaire    Feeling of Stress : Very much  Social Connections: Moderately Integrated (05/12/2022)   Social Connection and Isolation Panel [NHANES]    Frequency of Communication with Friends and Family: More than three times a week    Frequency of Social Gatherings with Friends and Family: Once a week    Attends Religious Services: More than 4 times per year    Active Member of [employer] or Organizations: Yes    Attends Banker Meetings: More than 4 times per year  Marital Status: Never married  Catering manager Violence: Not on file    Family History  Problem Relation Age of Onset   Multiple sclerosis Sister    Psoriasis Maternal Grandmother    Heart attack Mother        Late in life   Heart attack Father 15   CAD Father    Heart attack Paternal Grandfather 30       Sudden cardiac death   Sudden Cardiac Death Paternal Grandfather 18   Heart attack Maternal Uncle    CAD Maternal Uncle    Heart attack Cousin 69   Sudden Cardiac Death Cousin 45   Allergic rhinitis Neg Hx    Angioedema Neg Hx    Asthma Neg Hx    Eczema Neg Hx    Immunodeficiency Neg Hx    Urticaria Neg Hx     Review of Systems Constitutional: negative for anorexia, fevers and sweats  Eyes: negative for irritation, redness and visual disturbance  Ears, nose, mouth, throat, and face: negative for earaches, epistaxis, nasal congestion and sore throat  Cardiovascular: negative for chest pain, dyspnea, lower extremity edema, orthopnea, palpitations and syncope  Gastrointestinal: negative for abdominal pain, constipation, diarrhea, melena, nausea and vomiting  Genitourinary:negative for dysuria, frequency  and hematuria  Hematologic/lymphatic: negative for bleeding, easy bruising and lymphadenopathy  Musculoskeletal:negative for arthralgias, muscle weakness and stiff joints  Neurological: negative for coordination problems, gait problems, headaches and weakness  Endocrine: negative for diabetic symptoms including polydipsia, polyuria and weight loss     Objective:   Physical Exam  Gen. Pleasant, well-nourished, in no distress, normal affect ENT - no pallor,icterus, no post nasal drip Neck: No JVD, no thyromegaly, no carotid bruits Lungs: no use of accessory muscles, no dullness to percussion, decreased without rales or rhonchi  Cardiovascular: Rhythm regular, heart sounds  normal, no murmurs or gallops, no peripheral edema Abdomen: soft and non-tender, no hepatosplenomegaly, BS normal. Musculoskeletal: No deformities, no cyanosis or clubbing Neuro:  alert, non focal       Assessment & Plan:    Assessment and Plan    Emphysema Severe emphysema diagnosed in 2013 with 26% lung function, now 30%. Previous bronchial valve placement failed, causing pneumothorax. Daily exacerbations and significant physical limitations. CT scan showed resolved nodule. Patient prefers to avoid further lung reduction surgery due to previous complications and current functional status. Discussed risks (pneumothorax, prolonged recovery) and benefits (improved lung function) of surgery. - Prescribe prednisone for exacerbations - Refill Trelegy and Adderall prescriptions - Evaluate for oxygen qualification - Schedule follow-up in three months - Coordinate with [facility] for ongoing care and potential future procedures  Chronic Obstructive Pulmonary Disease (COPD) COPD with frequent exacerbations, managed with Trelegy. Symptoms improve with prednisone during flare-ups. Quit smoking in 2020 after 34 years. Discussed benefits of early prednisone intervention and importance of home pulmonary rehab exercises. - Prescribe  prednisone for exacerbations - Refill Trelegy prescription - Encourage continued use of home pulmonary rehab exercises - Monitor for exacerbations and provide early intervention  Hernia Hernia requiring surgical repair, pending dental clearance. Scheduled for surgery at [facility]. Discussed risks of general anesthesia and potential for spinal anesthesia to reduce respiratory complications. - Provide medical clearance for dental extractions - Coordinate with [facility] for hernia surgery and anesthesia management  OCD and Anxiety OCD and anxiety impacting medication adherence and pulmonary condition management. Patient hesitant to take necessary medications. Discussed impact on health and importance of addressing these issues to improve adherence. - Discuss potential  treatment options for OCD and anxiety - Consider referral to a mental health specialist for further evaluation and management  General Health Maintenance Received flu and pneumonia vaccines last year. Not received RSV vaccine. Discussed importance of annual flu vaccination and potential future RSV vaccination. - Administer flu vaccine for the current year - Discuss RSV vaccine eligibility and potential future recommendations  Follow-up - Schedule follow-up appointment after CT scan to review results - Maintain open communication for any exacerbations or urgent needs.

## 2022-12-22 NOTE — Patient Instructions (Addendum)
X Prednisone 10 mg tabs Take 4 tabs  daily with food x 4 days, then 3 tabs daily x 4 days, then 2 tabs daily x 4 days, then 1 tab daily x4 days then stop. #40  X refills on trelegy/ albuterol as needed  Flu shot recommended  X Amb sat

## 2022-12-22 NOTE — Progress Notes (Signed)
   Subjective:    Patient ID: Richard Fuentes, male    DOB: 05-12-1970, 52 y.o.   MRN: [id number]  HPI    Review of Systems     Objective:   Physical Exam        Assessment & Plan:

## 2023-01-05 DIAGNOSIS — J449 Chronic obstructive pulmonary disease, unspecified: Secondary | ICD-10-CM | POA: Diagnosis not present

## 2023-02-05 DIAGNOSIS — J449 Chronic obstructive pulmonary disease, unspecified: Secondary | ICD-10-CM | POA: Diagnosis not present

## 2023-03-05 DIAGNOSIS — J449 Chronic obstructive pulmonary disease, unspecified: Secondary | ICD-10-CM | POA: Diagnosis not present

## 2023-03-15 ENCOUNTER — Ambulatory Visit: Payer: Medicaid Other | Admitting: Family Medicine

## 2023-03-15 ENCOUNTER — Other Ambulatory Visit (HOSPITAL_BASED_OUTPATIENT_CLINIC_OR_DEPARTMENT_OTHER): Payer: Self-pay

## 2023-03-15 ENCOUNTER — Other Ambulatory Visit: Payer: Self-pay

## 2023-03-15 ENCOUNTER — Encounter: Payer: Self-pay | Admitting: Family Medicine

## 2023-03-15 VITALS — BP 110/70 | HR 70 | Temp 98.6°F | Ht 73.0 in | Wt 177.0 lb

## 2023-03-15 DIAGNOSIS — J431 Panlobular emphysema: Secondary | ICD-10-CM | POA: Diagnosis not present

## 2023-03-15 DIAGNOSIS — J9611 Chronic respiratory failure with hypoxia: Secondary | ICD-10-CM | POA: Diagnosis not present

## 2023-03-15 DIAGNOSIS — F411 Generalized anxiety disorder: Secondary | ICD-10-CM | POA: Diagnosis not present

## 2023-03-15 DIAGNOSIS — E782 Mixed hyperlipidemia: Secondary | ICD-10-CM | POA: Diagnosis not present

## 2023-03-15 DIAGNOSIS — F428 Other obsessive-compulsive disorder: Secondary | ICD-10-CM | POA: Diagnosis not present

## 2023-03-15 DIAGNOSIS — F331 Major depressive disorder, recurrent, moderate: Secondary | ICD-10-CM | POA: Diagnosis not present

## 2023-03-15 DIAGNOSIS — G4733 Obstructive sleep apnea (adult) (pediatric): Secondary | ICD-10-CM

## 2023-03-15 LAB — COMPREHENSIVE METABOLIC PANEL
ALT: 15 U/L (ref 0–53)
AST: 24 U/L (ref 0–37)
Albumin: 4.1 g/dL (ref 3.5–5.2)
Alkaline Phosphatase: 70 U/L (ref 39–117)
BUN: 15 mg/dL (ref 6–23)
CO2: 29 meq/L (ref 19–32)
Calcium: 9.3 mg/dL (ref 8.4–10.5)
Chloride: 104 meq/L (ref 96–112)
Creatinine, Ser: 1.12 mg/dL (ref 0.40–1.50)
GFR: 74.53 mL/min (ref >60.00)
Glucose, Bld: 103 mg/dL — ABNORMAL HIGH (ref 70–99)
Potassium: 4.1 meq/L (ref 3.5–5.1)
Sodium: 139 meq/L (ref 135–145)
Total Bilirubin: 0.4 mg/dL (ref 0.2–1.2)
Total Protein: 7.2 g/dL (ref 6.0–8.3)

## 2023-03-15 LAB — CBC WITH DIFFERENTIAL/PLATELET
Basophils Absolute: 0.1 10*3/uL (ref 0.0–0.1)
Basophils Relative: 0.7 % (ref 0.0–3.0)
Eosinophils Absolute: 0.2 10*3/uL (ref 0.0–0.7)
Eosinophils Relative: 2.1 % (ref 0.0–5.0)
HCT: 43.6 % (ref 39.0–52.0)
Hemoglobin: 14.7 g/dL (ref 13.0–17.0)
Lymphocytes Relative: 21.6 % (ref 12.0–46.0)
Lymphs Abs: 2 10*3/uL (ref 0.7–4.0)
MCHC: 33.6 g/dL (ref 30.0–36.0)
MCV: 93 fl (ref 78.0–100.0)
Monocytes Absolute: 0.9 10*3/uL (ref 0.1–1.0)
Monocytes Relative: 9.4 % (ref 3.0–12.0)
Neutro Abs: 6.2 10*3/uL (ref 1.4–7.7)
Neutrophils Relative %: 66.2 % (ref 43.0–77.0)
Platelets: 270 10*3/uL (ref 150.0–400.0)
RBC: 4.69 Mil/uL (ref 4.22–5.81)
RDW: 12.6 % (ref 11.5–15.5)
WBC: 9.4 10*3/uL (ref 4.0–10.5)

## 2023-03-15 LAB — LIPID PANEL
Cholesterol: 197 mg/dL (ref 0–200)
HDL: 47 mg/dL (ref >39.00)
LDL Cholesterol: 130 mg/dL — ABNORMAL HIGH (ref 0–99)
NonHDL: 150.41
Total CHOL/HDL Ratio: 4
Triglycerides: 103 mg/dL (ref 0.0–149.0)
VLDL: 20.6 mg/dL (ref 0.0–40.0)

## 2023-03-15 MED ORDER — PREDNISONE 10 MG (21) PO TBPK
ORAL_TABLET | Freq: Every day | ORAL | 0 refills | Status: AC
Start: 2023-03-15 — End: 2023-03-24
  Filled 2023-03-15: qty 21, 6d supply, fill #0

## 2023-03-15 MED ORDER — TRELEGY ELLIPTA 200-62.5-25 MCG/ACT IN AEPB
1.0000 | INHALATION_SPRAY | Freq: Every day | RESPIRATORY_TRACT | 3 refills | Status: DC
Start: 2023-03-15 — End: 2023-10-06
  Filled 2023-03-15: qty 60, 30d supply, fill #0
  Filled 2023-05-29: qty 60, 30d supply, fill #1
  Filled 2023-07-04 – 2023-08-02 (×2): qty 60, 30d supply, fill #2
  Filled 2023-08-10 – 2023-08-25 (×5): qty 60, 30d supply, fill #3

## 2023-03-15 MED ORDER — VENTOLIN HFA 108 (90 BASE) MCG/ACT IN AERS
2.0000 | INHALATION_SPRAY | RESPIRATORY_TRACT | 2 refills | Status: DC | PRN
Start: 2023-03-15 — End: 2023-08-02
  Filled 2023-03-15: qty 18, 25d supply, fill #0
  Filled 2023-07-04 – 2023-08-02 (×2): qty 18, 25d supply, fill #1

## 2023-03-15 NOTE — Progress Notes (Signed)
 New Patient Office Visit  Subjective    Patient ID: Richard Fuentes, male    DOB: 09-02-1970  Age: 53 y.o. MRN: [id number]  CC:  Chief Complaint  Patient presents with   Establish Care    HPI Richard Fuentes presents to establish care today. He is a previous patient of Dr. Shan Levans with [facility]. He is up to date on routine vaccines.  He is up to date on routine screenings.  Sees Dr Vassie Loll with pulmonology, will have follow up appt with him tomorrow.  Requesting refills of Trelegy and albuterol inhalers. Inquiring about having a prednisone rx on hand at home for exacerbations. Has home oxygen concentrator, uses 2 LPM at home, via nasal cannula. Walks when he can for pulmonary rehab. Is in need of portable oxygen concentrator, will order today. Has appointment for multiple tooth extraction on 04/20/2023. Also has left fat-containing inguinal hernia, plans to have this surgically repaired after he takes care of his teeth. Denies current concerns today. Medical history as outlined below.    Outpatient Encounter Medications as of 03/15/2023  Medication Sig   albuterol (VENTOLIN HFA) 108 (90 Base) MCG/ACT inhaler Inhale 2 puffs into the lungs every 4 (four) hours as needed for wheezing or shortness of breath.   desonide (DESOWEN) 0.05 % cream Apply 1 application  topically 2 (two) times daily as needed (psoriasis).   EPINEPHrine 0.3 mg/0.3 mL IJ SOAJ injection Inject 0.3 mg into the muscle as needed for anaphylaxis.   esomeprazole (NEXIUM) 40 MG capsule Take 1 capsule (40 mg total) by mouth 2 (two) times daily before a meal.   hydrocortisone 2.5 % cream Apply 1 application  topically 2 (two) times daily as needed (psoriasis).   ipratropium-albuterol (DUONEB) 0.5-2.5 (3) MG/3ML SOLN Inhale 3 mLs into the lungs every 6 (six) hours as needed (shortness of breath).   Multiple Vitamin (MULTIVITAMIN WITH MINERALS) TABS tablet Take 1 tablet by mouth daily.   OXYGEN  Inhale 2 L into the lungs continuous.   predniSONE (STERAPRED UNI-PAK 21 TAB) 10 MG (21) TBPK tablet Take by mouth daily for 6 days. Take 6 tablets on day 1, 5 tablets on day 2, 4 tablets on day 3, 3 tablets on day 4, 2 tablets on day 5, 1 tablet on day 6   [DISCONTINUED] albuterol (VENTOLIN HFA) 108 (90 Base) MCG/ACT inhaler Inhale 2 puffs into the lungs every 4 (four) hours as needed for wheezing or shortness of breath.   [DISCONTINUED] Fluticasone-Umeclidin-Vilant (TRELEGY ELLIPTA) 200-62.5-25 MCG/ACT AEPB Inhale 1 puff into the lungs daily.   Fluticasone-Umeclidin-Vilant (TRELEGY ELLIPTA) 200-62.5-25 MCG/ACT AEPB Inhale 1 puff into the lungs daily.   [DISCONTINUED] clonazePAM (KLONOPIN) 0.5 MG tablet Take 1 tablet (0.5 mg total) by mouth 2 (two) times daily as needed for anxiety.   No facility-administered encounter medications on file as of 03/15/2023.    Past Medical History:  Diagnosis Date   Allergy    Penicillin,wool, shellfish   Anxiety    Asthma    COPD (chronic obstructive pulmonary disease) (HCC)    Severe centrilobular and paraseptal bullous emphysema; Gold stage D; FEV1 and DLCO<30%.;  Currently undergoing transplant evaluation   Depression    Diabetes (HCC)    Dyspnea    Emphysema of lung (HCC)    Environmental allergies    Esophagitis    Family history of adverse reaction to anesthesia    Paternal aunt had trouble waking   GERD (gastroesophageal reflux disease)  Headache    Hx of migraines    OCD (obsessive compulsive disorder)    OCD (obsessive compulsive disorder)    Oxygen deficiency    Pneumonia    Pneumothorax on left 03/20/2020   Psoriasis    Sleep apnea    Ulcer    Urticaria     Past Surgical History:  Procedure Laterality Date   Endobronchial valve Left 03/20/2020   LUL at [facility]   KNEE SURGERY Right 2002   arthroscopy and cartilidge removed   KNEE SURGERY Left 1987   Ligaments removed and screw in place    Family History  Problem Relation  Age of Onset   Multiple sclerosis Sister    Psoriasis Maternal Grandmother    Cancer Maternal Grandmother    Heart attack Mother        Late in life   Arthritis Mother    Depression Mother    Diabetes Mother    Drug abuse Mother    Hearing loss Mother    Heart disease Mother    Heart attack Father 58   CAD Father    ADD / ADHD Father    Alcohol abuse Father    Arthritis Father    Asthma Father    COPD Father    Depression Father    Drug abuse Father    Heart disease Father    Hypertension Father    Stroke Father    Heart attack Paternal Grandfather 57       Sudden cardiac death   Sudden Cardiac Death Paternal Grandfather 63   Heart attack Maternal Uncle    CAD Maternal Uncle    Heart attack Cousin 51   Sudden Cardiac Death Cousin 27   Anxiety disorder Paternal Grandmother    Cancer Maternal Uncle    Allergic rhinitis Neg Hx    Angioedema Neg Hx    Eczema Neg Hx    Immunodeficiency Neg Hx    Urticaria Neg Hx     Social History   Socioeconomic History   Marital status: Significant Other    Spouse name: Not on file   Number of children: Not on file   Years of education: Not on file   Highest education level: Some college, no degree  Occupational History   Not on file  Tobacco Use   Smoking status: Former    Current packs/day: 0.00    Average packs/day: 1 pack/day for 30.0 years (30.0 ttl pk-yrs)    Types: Cigarettes    Start date: 01/20/1988    Quit date: 01/19/2018    Years since quitting: 5.1    Passive exposure: Past   Smokeless tobacco: Never  Vaping Use   Vaping status: Never Used  Substance and Sexual Activity   Alcohol use: No   Drug use: No   Sexual activity: Not Currently  Other Topics Concern   Not on file  Social History Narrative   Not on file   Social Drivers of Health   Financial Resource Strain: Medium Risk (03/15/2023)   Overall Financial Resource Strain (CARDIA)    Difficulty of Paying Living Expenses: Somewhat hard  Food  Insecurity: No Food Insecurity (03/15/2023)   Hunger Vital Sign    Worried About Running Out of Food in the Last Year: Never true    Ran Out of Food in the Last Year: Never true  Transportation Needs: No Transportation Needs (03/15/2023)   PRAPARE - Administrator, Civil Service (Medical): No  Lack of Transportation (Non-Medical): No  Physical Activity: Insufficiently Active (03/15/2023)   Exercise Vital Sign    Days of Exercise per Week: 2 days    Minutes of Exercise per Session: 10 min  Stress: Stress Concern Present (03/15/2023)   [employer] of Occupational Health - Occupational Stress Questionnaire    Feeling of Stress : Very much  Social Connections: Moderately Integrated (03/15/2023)   Social Connection and Isolation Panel [NHANES]    Frequency of Communication with Friends and Family: More than three times a week    Frequency of Social Gatherings with Friends and Family: Once a week    Attends Religious Services: 1 to 4 times per year    Active Member of [employer] or Organizations: No    Attends Engineer, structural: More than 4 times per year    Marital Status: Never married  Catering manager Violence: Not on file    ROS Per HPI      Objective    BP 110/70 (BP Location: Left Arm, Patient Position: Sitting)   Pulse 70   Temp 98.6 F (37 C) (Temporal)   Ht 6\' 1"  (1.854 m)   Wt 177 lb (80.3 kg)   SpO2 99%   BMI 23.35 kg/m   Physical Exam Vitals and nursing note reviewed.  Constitutional:      General: He is not in acute distress.    Appearance: Normal appearance.  HENT:     Head: Normocephalic and atraumatic.  Eyes:     Extraocular Movements: Extraocular movements intact.  Cardiovascular:     Rate and Rhythm: Normal rate and regular rhythm.     Pulses: Normal pulses.     Heart sounds: Normal heart sounds.  Pulmonary:     Effort: Pulmonary effort is normal. No respiratory distress.     Breath sounds: Decreased air movement present.  Decreased breath sounds present.     Comments: Globally decreased breath sounds, cough with laughing, cough when speaking with multiple sentences Musculoskeletal:        General: Normal range of motion.     Cervical back: Normal range of motion.  Lymphadenopathy:     Cervical: No cervical adenopathy.  Skin:    General: Skin is warm and dry.  Neurological:     General: No focal deficit present.     Mental Status: He is alert and oriented to person, place, and time.  Psychiatric:        Mood and Affect: Mood normal.        Behavior: Behavior normal.        Assessment & Plan:   Panlobular emphysema (HCC) -     Trelegy Ellipta; Inhale 1 puff into the lungs daily.  Dispense: 60 each; Refill: 3 -     Ventolin HFA; Inhale 2 puffs into the lungs every 4 (four) hours as needed for wheezing or shortness of breath.  Dispense: 6.7 g; Refill: 2 -     predniSONE; Take by mouth daily for 6 days. Take 6 tablets on day 1, 5 tablets on day 2, 4 tablets on day 3, 3 tablets on day 4, 2 tablets on day 5, 1 tablet on day 6  Dispense: 21 tablet; Refill: 0 -     CBC with Differential/Platelet -     Comprehensive metabolic panel -     For home use only DME oxygen  Chronic respiratory failure with hypoxia (HCC) -     Trelegy Ellipta; Inhale 1 puff into the lungs  daily.  Dispense: 60 each; Refill: 3 -     Ventolin HFA; Inhale 2 puffs into the lungs every 4 (four) hours as needed for wheezing or shortness of breath.  Dispense: 6.7 g; Refill: 2 -     predniSONE; Take by mouth daily for 6 days. Take 6 tablets on day 1, 5 tablets on day 2, 4 tablets on day 3, 3 tablets on day 4, 2 tablets on day 5, 1 tablet on day 6  Dispense: 21 tablet; Refill: 0 -     CBC with Differential/Platelet -     Comprehensive metabolic panel -     For home use only DME oxygen  Major depressive disorder, recurrent, moderate (HCC) -     CBC with Differential/Platelet -     Comprehensive metabolic panel  OSA (obstructive sleep  apnea) -     CBC with Differential/Platelet -     Comprehensive metabolic panel  Generalized anxiety disorder -     CBC with Differential/Platelet -     Comprehensive metabolic panel  Other obsessive-compulsive disorders -     CBC with Differential/Platelet -     Comprehensive metabolic panel  Mixed hyperlipidemia -     Lipid panel     Return in about 6 months (around 09/15/2023) for med mgt.   Moshe Cipro, FNP

## 2023-03-15 NOTE — Patient Instructions (Addendum)
 Welcome to [facility]!  Thank you for choosing [place] for your Primary Care needs.   We offer in person and video appointments for your convenience. You may call our office to schedule appointments, or you may schedule appointments with me through MyChart.   The best way to get in contact with me is via MyChart message. This will get to me faster than a phone call, unless there is an emergency, then please call 911.  The lab is located downstairs in the Sports Medicine building, we also have xray available there.    We are checking labs today, will be in contact with any results that require further attention  Follow up with me in about 6 months

## 2023-03-16 ENCOUNTER — Ambulatory Visit (HOSPITAL_BASED_OUTPATIENT_CLINIC_OR_DEPARTMENT_OTHER): Payer: Medicaid Other | Admitting: Pulmonary Disease

## 2023-03-16 ENCOUNTER — Encounter (HOSPITAL_BASED_OUTPATIENT_CLINIC_OR_DEPARTMENT_OTHER): Payer: Self-pay | Admitting: Pulmonary Disease

## 2023-03-23 ENCOUNTER — Encounter (HOSPITAL_BASED_OUTPATIENT_CLINIC_OR_DEPARTMENT_OTHER): Payer: Self-pay

## 2023-03-25 ENCOUNTER — Ambulatory Visit (INDEPENDENT_AMBULATORY_CARE_PROVIDER_SITE_OTHER)

## 2023-03-25 ENCOUNTER — Other Ambulatory Visit (HOSPITAL_BASED_OUTPATIENT_CLINIC_OR_DEPARTMENT_OTHER): Payer: Self-pay

## 2023-03-25 ENCOUNTER — Ambulatory Visit: Admitting: Primary Care

## 2023-03-25 ENCOUNTER — Encounter: Payer: Self-pay | Admitting: Primary Care

## 2023-03-25 VITALS — BP 107/65 | HR 65 | Temp 96.8°F | Ht 73.0 in | Wt 176.0 lb

## 2023-03-25 DIAGNOSIS — Z9981 Dependence on supplemental oxygen: Secondary | ICD-10-CM

## 2023-03-25 DIAGNOSIS — L400 Psoriasis vulgaris: Secondary | ICD-10-CM

## 2023-03-25 DIAGNOSIS — J439 Emphysema, unspecified: Secondary | ICD-10-CM | POA: Diagnosis not present

## 2023-03-25 DIAGNOSIS — J9611 Chronic respiratory failure with hypoxia: Secondary | ICD-10-CM

## 2023-03-25 DIAGNOSIS — G4733 Obstructive sleep apnea (adult) (pediatric): Secondary | ICD-10-CM | POA: Diagnosis not present

## 2023-03-25 DIAGNOSIS — R0600 Dyspnea, unspecified: Secondary | ICD-10-CM | POA: Diagnosis not present

## 2023-03-25 DIAGNOSIS — Z87891 Personal history of nicotine dependence: Secondary | ICD-10-CM

## 2023-03-25 DIAGNOSIS — R0602 Shortness of breath: Secondary | ICD-10-CM

## 2023-03-25 MED ORDER — PREDNISONE 10 MG PO TABS
ORAL_TABLET | ORAL | 0 refills | Status: AC
  Filled 2023-03-25: qty 45, 15d supply, fill #0

## 2023-03-25 NOTE — Progress Notes (Signed)
 Please let patient know chest x-ray showed no active cardiopulmonary disease

## 2023-03-25 NOTE — Progress Notes (Signed)
 @Patient  ID: Billie Ruddy, male    DOB: September 11, 1970, 53 y.o.   MRN: [id number]  Chief Complaint  Patient presents with   Follow-up    Low o2 sats- pt went as low as 83%. Does not normally use his o2, sob with exertion.     Referring provider: Moshe Cipro, FNP  HPI: 53 year old male, former smoker quit in 2020 (70-pack-year history)..  Past medical history significant for COPD, severe centrilobular emphysema.  Former patient of Dr. Delford Field, following with [facility] for the past few years.  Current patient of Dr. Vassie Loll, last seen on 12/22/2022.   Significant events 03/2020 patient underwent bronchoscopic lung volume reduction with Zephyr endobronchial valve placement x 4 to the left upper lobe, complicated by left-sided pneumothorax  01/15/2021 replacement of apicoposterior segment valves in the left upper lobe  He has been evaluated by [facility] and thoracic surgery.  Felt not to be candidate for repeat endobronchial valves and felt to be a candidate for LV RS but he is hesitant to proceed.  He is contemplating other elective surgeries finished such as teeth extraction and a hernia repair.  Plan is for endobronchial valve removal after he completes the surgeries.  He has undergone transplant evaluation for turndown due to his severe OCD and absence of 2 caregivers.  At any rate he does not want to pursue transplant    Emphysema Severe emphysema diagnosed in 2013 with 26% lung function, now 30%. Previous bronchial valve placement failed, causing pneumothorax. Daily exacerbations and significant physical limitations. CT scan showed resolved nodule. Patient prefers to avoid further lung reduction surgery due to previous complications and current functional status. Discussed risks (pneumothorax, prolonged recovery) and benefits (improved lung function) of surgery. - Prescribe prednisone for exacerbations - Refill Trelegy and Adderall prescriptions - Evaluate for oxygen qualification - Schedule  follow-up in three months - Coordinate with [facility] for ongoing care and potential future procedures   Chronic Obstructive Pulmonary Disease (COPD) COPD with frequent exacerbations, managed with Trelegy. Symptoms improve with prednisone during flare-ups. Quit smoking in 2020 after 34 years. Discussed benefits of early prednisone intervention and importance of home pulmonary rehab exercises. - Prescribe prednisone for exacerbations - Refill Trelegy prescription - Encourage continued use of home pulmonary rehab exercises - Monitor for exacerbations and provide early intervention   Hernia Hernia requiring surgical repair, pending dental clearance. Scheduled for surgery at [facility]. Discussed risks of general anesthesia and potential for spinal anesthesia to reduce respiratory complications. - Provide medical clearance for dental extractions - Coordinate with [facility] for hernia surgery and anesthesia management   OCD and Anxiety OCD and anxiety impacting medication adherence and pulmonary condition management. Patient hesitant to take necessary medications. Discussed impact on health and importance of addressing these issues to improve adherence. - Discuss potential treatment options for OCD and anxiety - Consider referral to a mental health specialist for further evaluation and management      03/25/2023- Interim hx  Patient presents today for acute office visit due to low oxygen levels.  He is maintained on Trelegy Ellipta, Ventolin HFA.  He was recently seen by primary care on 03/15/23 and given prednisone taper.  Uses home oxygen on 2 LPM via nasal cannula. In need for POC. Scheduled for multiple tooth extraction on 04/20/23  Discussed the use of AI scribe software for clinical note transcription with the patient, who gave verbal consent to proceed.  History of Present Illness   Chen Saadeh is a 53 year old male with a  history of bronchial valve replacement who presents with worsening shortness of  breath and fluctuating oxygen levels.  He has been experiencing worsening shortness of breath and fluctuating oxygen levels over the past few weeks. H reports that his oxygen levels drop significantly even at rest, and he experiences increased shortness of breath with minimal exertion. He monitors his oxygen levels at home and notes these fluctuations. He has not taken recently prescribed prednisone course sent in by his new PCP. No fever, productive cough, or postnasal drip, but there is occasional hemoptysis, which is not new.  He has a history of bronchial valve replacement and underwent a CT scan of his chest in October 2024 with [facility], which showed resolution of a previously seen nodule in the right lung base and unchanged appearance of the left upper lobe. He is scheduled for a follow-up CT scan at the end of the month.  He experiences sleep paralysis and difficulty breathing at night, leading him to use supplemental oxygen more frequently. He has a history of sleep apnea but cannot use CPAP due to the bronchial valve. He finds it uncomfortable to use oxygen at night due to the noise of the machine.  He has a history of plaque psoriasis, which is currently flared up. He previously used Care[employer] adviser but has paused it due to ongoing dental work and an Optician, dispensing. He finds prednisone effective in managing his symptoms but has not been on a regular regimen due to concerns about long-term use.  He uses a nebulizer, inhalers, and performs pulmonary rehab exercises at home. He feels weaker than usual, with decreased energy levels, and difficulty performing daily activities.          Allergies  Allergen Reactions   Other Anaphylaxis    Wool   Wool Alcohol [Lanolin] Hives   Penicillins Other (See Comments)    Childhood allergy  Has patient had a PCN reaction causing immediate rash, facial/tongue/throat swelling, SOB or lightheadedness with hypotension: n/a Has patient had a PCN reaction  causing severe rash involving mucus membranes or skin necrosis: n/a Has patient had a PCN reaction that required hospitalization: n/a Has patient had a PCN reaction occurring within the last 10 years: n/a If all of the above answers are "NO", then may proceed with Cephalosporin use.    Shellfish Allergy     Unknown reaction    Immunization History  Administered Date(s) Administered   PFIZER Comirnaty(Gray Top)Covid-19 Tri-Sucrose Vaccine 01/08/2020, 01/29/2020   PNEUMOCOCCAL CONJUGATE-20 07/29/2021    Past Medical History:  Diagnosis Date   Allergy    Penicillin,wool, shellfish   Anxiety    Asthma    COPD (chronic obstructive pulmonary disease) (HCC)    Severe centrilobular and paraseptal bullous emphysema; Gold stage D; FEV1 and DLCO<30%.;  Currently undergoing transplant evaluation   Depression    Diabetes (HCC)    Dyspnea    Emphysema of lung (HCC)    Environmental allergies    Esophagitis    Family history of adverse reaction to anesthesia    Paternal aunt had trouble waking   GERD (gastroesophageal reflux disease)    Headache    Hx of migraines    OCD (obsessive compulsive disorder)    OCD (obsessive compulsive disorder)    Oxygen deficiency    Pneumonia    Pneumothorax on left 03/20/2020   Psoriasis    Sleep apnea    Ulcer    Urticaria     Tobacco History: Social History   Tobacco  Use  Smoking Status Former   Current packs/day: 0.00   Average packs/day: 1 pack/day for 30.0 years (30.0 ttl pk-yrs)   Types: Cigarettes   Start date: 01/20/1988   Quit date: 01/19/2018   Years since quitting: 5.1   Passive exposure: Past  Smokeless Tobacco Never   Counseling given: Not Answered   Outpatient Medications Prior to Visit  Medication Sig Dispense Refill   albuterol (VENTOLIN HFA) 108 (90 Base) MCG/ACT inhaler Inhale 2 puffs into the lungs every 4 (four) hours as needed for wheezing or shortness of breath. 6.7 g 2   desonide (DESOWEN) 0.05 % cream Apply 1  application  topically 2 (two) times daily as needed (psoriasis).     EPINEPHrine 0.3 mg/0.3 mL IJ SOAJ injection Inject 0.3 mg into the muscle as needed for anaphylaxis. 2 each 1   esomeprazole (NEXIUM) 40 MG capsule Take 1 capsule (40 mg total) by mouth 2 (two) times daily before a meal. 60 capsule 2   Fluticasone-Umeclidin-Vilant (TRELEGY ELLIPTA) 200-62.5-25 MCG/ACT AEPB Inhale 1 puff into the lungs daily. 60 each 3   hydrocortisone 2.5 % cream Apply 1 application  topically 2 (two) times daily as needed (psoriasis).     ipratropium-albuterol (DUONEB) 0.5-2.5 (3) MG/3ML SOLN Inhale 3 mLs into the lungs every 6 (six) hours as needed (shortness of breath). 360 mL 0   Multiple Vitamin (MULTIVITAMIN WITH MINERALS) TABS tablet Take 1 tablet by mouth daily.     OXYGEN Inhale 2 L into the lungs continuous.     No facility-administered medications prior to visit.   Review of Systems  Review of Systems  Constitutional:  Positive for fatigue.  HENT: Negative.    Respiratory:  Positive for cough and shortness of breath.   Cardiovascular: Negative.    Physical Exam  BP 107/65 (BP Location: Right Arm, Patient Position: Sitting, Cuff Size: Large)   Pulse 65   Temp (!) 96.8 F (36 C) (Temporal)   Ht 6\' 1"  (1.854 m)   Wt 176 lb (79.8 kg)   SpO2 97%   BMI 23.22 kg/m  Physical Exam Constitutional:      General: He is not in acute distress.    Appearance: Normal appearance. He is not ill-appearing.  HENT:     Head: Normocephalic and atraumatic.  Cardiovascular:     Rate and Rhythm: Normal rate and regular rhythm.  Pulmonary:     Effort: Pulmonary effort is normal.     Breath sounds: Normal breath sounds. No wheezing, rhonchi or rales.  Musculoskeletal:        General: Normal range of motion.     Right lower leg: No edema.     Left lower leg: No edema.  Skin:    General: Skin is warm and dry.  Neurological:     General: No focal deficit present.     Mental Status: He is alert and  oriented to person, place, and time. Mental status is at baseline.  Psychiatric:        Mood and Affect: Mood normal.        Behavior: Behavior normal.        Thought Content: Thought content normal.        Judgment: Judgment normal.     Lab Results:  CBC    Component Value Date/Time   WBC 9.4 03/15/2023 1118   RBC 4.69 03/15/2023 1118   HGB 14.7 03/15/2023 1118   HGB 15.5 03/23/2021 1135   HCT 43.6 03/15/2023 1118  HCT 44.9 03/23/2021 1135   PLT 270.0 03/15/2023 1118   PLT 325 03/23/2021 1135   MCV 93.0 03/15/2023 1118   MCV 88 03/23/2021 1135   MCH 31.0 07/04/2022 2243   MCHC 33.6 03/15/2023 1118   RDW 12.6 03/15/2023 1118   RDW 11.7 03/23/2021 1135   LYMPHSABS 2.0 03/15/2023 1118   LYMPHSABS 2.1 03/23/2021 1135   MONOABS 0.9 03/15/2023 1118   EOSABS 0.2 03/15/2023 1118   EOSABS 0.2 03/23/2021 1135   BASOSABS 0.1 03/15/2023 1118   BASOSABS 0.1 03/23/2021 1135    BMET    Component Value Date/Time   NA 139 03/15/2023 1118   NA 141 03/23/2021 1135   K 4.1 03/15/2023 1118   CL 104 03/15/2023 1118   CO2 29 03/15/2023 1118   GLUCOSE 103 (H) 03/15/2023 1118   BUN 15 03/15/2023 1118   BUN 16 03/23/2021 1135   CREATININE 1.12 03/15/2023 1118   CALCIUM 9.3 03/15/2023 1118   GFRNONAA >60 07/04/2022 2243   GFRAA 93 10/04/2019 1033    BNP No results found for: "BNP"  ProBNP No results found for: "PROBNP"  Imaging: No results found.   Assessment & Plan:   1. Chronic bullous emphysema (HCC) (Primary) - DG Chest 2 View; Future  2. Chronic respiratory failure with hypoxia (HCC) - DG Chest 2 View; Future  3. Shortness of breath   COPD with frequent exacerbations/ Emphysema - No signs of acute exacerbation or infectious process - Continue Trelegy Ellipta, Albuterol and pulmonary rehab     Hypoxemia Fluctuating oxygen levels with episodes of hypoxemia at rest, increased dyspnea, and weakness. Concerns due to history of lung surgeries and bronchial valve  replacement. Walk test in office today on room air was re-assuring without oxygen desaturations, he was able to maintain O2 >99% after walking 750 feet  - Order chest x-ray to rule out pneumonia or other acute issues. - Perform a walk test to assess oxygen levels.  Sleep apnea Sleep apnea with inability to use CPAP due to bronchial valve replacement. Experiences sleep paralysis and apnea, especially supine. - Advise patient adhear to wearing nocturnal oxygen  - Encourage side sleeping or retry wedge pillow to reduce apnea episodes. - Discuss obtaining a quieter or portable oxygen concentrator at follow-up.  Psoriasis flare Plaque psoriasis flare with plaques on ankle, knees, left elbow, and scalp. Skyrizi on hold due to dental work and upcoming hernia surgery. Prednisone helps with symptoms and breathing but causes irritability. - Prescribe prednisone with tapering dose starting at 50 mg for three days, then taper to 40 mg, 30 mg, and 20 mg.  Follow-up 6 weeks with Dr. Vassie Loll after CT chest   More than 30-40 mins spent on case; > 50% face to face   Glenford Bayley, NP 03/25/2023

## 2023-03-25 NOTE — Patient Instructions (Addendum)
-  EMPHYSEMA: Continue Trelegy Ellipta, Albuterol as needed and pulmonary rehab   -HYPOXEMIA: Hypoxemia means low levels of oxygen in your blood. We will order a chest x-ray to check for pneumonia or other issues, perform a walk test to assess your oxygen levels. You should use nocturnal oxygen. Your oxygen stay at 99% on room air after walking 750 feet. Continue to monitor levels at home. But todays walk test was reassuring   -SLEEP APNEA: Sleep apnea is a condition where you stop breathing for short periods during sleep. Since you cannot use CPAP due to your bronchial valve, try sleeping on your side or using a wedge pillow. We will also discuss options for a quieter or portable oxygen concentrator.  -PSORIASIS FLARE: Psoriasis is a skin condition that causes red, scaly patches. Your psoriasis has flared up, and we will start you on a tapering dose of prednisone, beginning at 50 mg for three days and then gradually decreasing. We will also refill your prednisone for future flare-ups and monitor for any side effects.  Orders: Walk test CXR   Follow-up: 4-8 weeks with Dr. Vassie Loll / [facility]

## 2023-03-30 ENCOUNTER — Other Ambulatory Visit (HOSPITAL_BASED_OUTPATIENT_CLINIC_OR_DEPARTMENT_OTHER): Payer: Self-pay

## 2023-03-31 DIAGNOSIS — J449 Chronic obstructive pulmonary disease, unspecified: Secondary | ICD-10-CM | POA: Diagnosis not present

## 2023-04-04 DIAGNOSIS — R911 Solitary pulmonary nodule: Secondary | ICD-10-CM | POA: Diagnosis not present

## 2023-04-04 DIAGNOSIS — J9811 Atelectasis: Secondary | ICD-10-CM | POA: Diagnosis not present

## 2023-04-04 DIAGNOSIS — R918 Other nonspecific abnormal finding of lung field: Secondary | ICD-10-CM | POA: Diagnosis not present

## 2023-04-05 ENCOUNTER — Ambulatory Visit (HOSPITAL_BASED_OUTPATIENT_CLINIC_OR_DEPARTMENT_OTHER): Admitting: Pulmonary Disease

## 2023-04-05 DIAGNOSIS — J449 Chronic obstructive pulmonary disease, unspecified: Secondary | ICD-10-CM | POA: Diagnosis not present

## 2023-04-18 ENCOUNTER — Other Ambulatory Visit (HOSPITAL_BASED_OUTPATIENT_CLINIC_OR_DEPARTMENT_OTHER): Payer: Self-pay

## 2023-05-05 DIAGNOSIS — J449 Chronic obstructive pulmonary disease, unspecified: Secondary | ICD-10-CM | POA: Diagnosis not present

## 2023-05-17 ENCOUNTER — Ambulatory Visit: Payer: Self-pay | Admitting: Internal Medicine

## 2023-05-17 ENCOUNTER — Ambulatory Visit: Admitting: Internal Medicine

## 2023-05-17 ENCOUNTER — Ambulatory Visit (HOSPITAL_BASED_OUTPATIENT_CLINIC_OR_DEPARTMENT_OTHER): Payer: Self-pay | Admitting: Pulmonary Disease

## 2023-05-17 ENCOUNTER — Ambulatory Visit (INDEPENDENT_AMBULATORY_CARE_PROVIDER_SITE_OTHER)

## 2023-05-17 ENCOUNTER — Encounter: Payer: Self-pay | Admitting: Internal Medicine

## 2023-05-17 VITALS — BP 102/70 | HR 71 | Temp 98.4°F | Ht 73.0 in | Wt 176.2 lb

## 2023-05-17 DIAGNOSIS — J431 Panlobular emphysema: Secondary | ICD-10-CM

## 2023-05-17 DIAGNOSIS — R0609 Other forms of dyspnea: Secondary | ICD-10-CM | POA: Insufficient documentation

## 2023-05-17 DIAGNOSIS — J9611 Chronic respiratory failure with hypoxia: Secondary | ICD-10-CM | POA: Diagnosis not present

## 2023-05-17 DIAGNOSIS — R0602 Shortness of breath: Secondary | ICD-10-CM | POA: Diagnosis not present

## 2023-05-17 NOTE — Patient Instructions (Signed)
 Nexium  40 mg Take 30- 60 min before your first and last meals of the day   Adjust 02 flow to saturations around 90% (keep 02 portable tank near you)    Go ahead and take prednisone  as you usually do.    Please remember to go to the  x-ray department  for your tests - we will call you with the results when they are available    If you  get worse you will need to go to ER

## 2023-05-17 NOTE — Progress Notes (Unsigned)
 Richard Fuentes, male    DOB: October 30, 1970   MRN: [id number]   Brief patient profile:  1  yowm  quit smoking 2020 self- referred to pulmonary clinic 05/17/2023  for worse dyspnea   in setting of very severe copd  (GOLD 3) with air trapping but not typically 02 dep at rest.     History of Present Illness  05/17/2023  Pulmonary/ 1st office eval/Catharina Pica maint on trelegy  Chief Complaint  Patient presents with   Acute Visit    Increase sob for 1 week.  Yesterday episode of could in get air in at [employer].  During the night he was having pain and felt like he had a bag over his head and could not breathe.  Sats was 79%.  Dyspnea:  [employer] can [employer] walking slowly for up to 10 minutes s stopping with sats around 90% Cough: more non-productive / neg nettipot  Sleep: bed  with wedge or couch  SABA use: last used saba in parking lot  02 use:2lpm prn hs  Spells come on without warning and so severe he can't speak a word and suddenly loses consciousness, then awakens with symptoms resolved s specific rx (he does not turn up his 02 as can't get to concentrator and doesn't keep his tanks nearby)  Typically prednisone  does help  and keeps spare supply on hand but doesn't like to use it.     No obvious day to day or daytime pattern/variability or assoc excess/ purulent sputum or mucus plugs or hemoptysis or   chest tightness, subjective wheeze or overt sinus or hb symptoms.    Also denies any obvious fluctuation of symptoms with weather or environmental changes or other aggravating or alleviating factors except as outlined above   No unusual exposure hx or h/o childhood pna/ asthma or knowledge of premature birth.  Current Allergies, Complete Past Medical History, Past Surgical History, Family History, and Social History were reviewed in [employer] record.  ROS  The following are not active complaints unless bolded Hoarseness, sore throat, dysphagia, dental problems, itching,  sneezing,  nasal congestion or discharge of excess mucus or purulent secretions, ear ache,   fever, chills, sweats, unintended wt loss or wt gain, classically pleuritic or exertional cp,  orthopnea pnd or arm/hand swelling  or leg swelling, presyncope, palpitations, abdominal pain, anorexia, nausea, vomiting, diarrhea  or change in bowel habits or change in bladder habits, change in stools or change in urine, dysuria, hematuria,  rash, arthralgias, visual complaints, headache, numbness, weakness or ataxia or problems with walking or coordination,  change in mood or  memory.             Outpatient Medications Prior to Visit  Medication Sig Dispense Refill   albuterol  (VENTOLIN  HFA) 108 (90 Base) MCG/ACT inhaler Inhale 2 puffs into the lungs every 4 (four) hours as needed for wheezing or shortness of breath. 6.7 g 2   desonide (DESOWEN) 0.05 % cream Apply 1 application  topically 2 (two) times daily as needed (psoriasis).     EPINEPHrine  0.3 mg/0.3 mL IJ SOAJ injection Inject 0.3 mg into the muscle as needed for anaphylaxis. 2 each 1   esomeprazole  (NEXIUM ) 40 MG capsule Take 1 capsule (40 mg total) by mouth 2 (two) times daily before a meal. 60 capsule 2   Fluticasone -Umeclidin-Vilant (TRELEGY ELLIPTA ) 200-62.5-25 MCG/ACT AEPB Inhale 1 puff into the lungs daily. 60 each 3   hydrocortisone 2.5 % cream Apply 1 application  topically 2 (two) times daily as needed (psoriasis).     ipratropium-albuterol  (DUONEB) 0.5-2.5 (3) MG/3ML SOLN Inhale 3 mLs into the lungs every 6 (six) hours as needed (shortness of breath). 360 mL 0   Multiple Vitamin (MULTIVITAMIN WITH MINERALS) TABS tablet Take 1 tablet by mouth daily.     OXYGEN  Inhale 2 L into the lungs continuous.     No facility-administered medications prior to visit.    Past Medical History:  Diagnosis Date   Allergy     Penicillin ,wool, shellfish   Anxiety    Asthma    COPD (chronic obstructive pulmonary disease) (HCC)    Severe centrilobular  and paraseptal bullous emphysema; Gold stage D; FEV1 and DLCO<30%.;  Currently undergoing transplant evaluation   Depression    Diabetes (HCC)    Dyspnea    Emphysema of lung (HCC)    Environmental allergies    Esophagitis    Family history of adverse reaction to anesthesia    Paternal aunt had trouble waking   GERD (gastroesophageal reflux disease)    Headache    Hx of migraines    OCD (obsessive compulsive disorder)    OCD (obsessive compulsive disorder)    Oxygen  deficiency    Pneumonia    Pneumothorax on left 03/20/2020   Psoriasis    Sleep apnea    Ulcer    Urticaria       Objective:     BP 102/70 (BP Location: Left Arm, Patient Position: Sitting, Cuff Size: Normal)   Pulse 71   Temp 98.4 F (36.9 C) (Oral)   Ht 6\' 1"  (1.854 m)   Wt 176 lb 3.2 oz (79.9 kg)   SpO2 96%   BMI 23.25 kg/m   SpO2: 96 % RA  HEENT :  Oropharynx  clear   Nasal turbinates ***   NECK :  without JVD/Nodes/TM/ nl carotid upstrokes bilaterally   LUNGS: no acc muscle use,  Mod barrel  contour chest wall with bilateral  Distant bs s audible wheeze and  without cough on insp or exp maneuvers and mod  Hyperresonant  to  percussion bilaterally     CV:  RRR  no s3 or murmur or increase in P2, and no edema   ABD:  soft and nontender    MS:   Ext warm without deformities or   obvious joint restrictions , calf tenderness, cyanosis or clubbing  SKIN: warm and dry without lesions    NEURO:  alert, approp, nl sensorium with  no motor or cerebellar deficits apparent.          CXR PA and Lateral:   05/17/2023 :    I personally reviewed images and impression is as follows:     Hyperinflated with increase markings ? Crowded due to upper lobe expansion?         Assessment   No problem-specific Assessment & Plan notes found for this encounter.     Vernestine Gondola, MD 05/17/2023

## 2023-05-17 NOTE — Telephone Encounter (Signed)
 Chief Complaint: SOB Symptoms: hypoxia, cough, chest tightness Frequency: yesterday Pertinent Negatives: Patient denies fever, URI sx, CP, severe SOB Disposition: [] ED /[] Urgent Care (no appt availability in office) / [x] Appointment(In office/virtual)/ []  [facility]/ [] Home Care/ [] Refused Recommended Disposition /[] [facility]/ []  Follow-up with PCP Additional Notes: Pt c/o worsening exertional SOB, hypoxia, cough, and chest tightness. Pt reports having hypoxic event yesterday while at [facility], and was "gasping for air". Pt does endorse feeling better today, but is requiring O2 use and DuoNeb to manage sx. Pt reports taking Inh medications as instructed. Scheduled patient per protocol on May 17, 2023 with alternate provider. Patient verbalized understanding and to call back/go to ED with worsening symptoms.      Copied from CRM [id number]. Topic: Clinical - Red Word Triage >> May 17, 2023 11:41 AM Ambrose Junk wrote: [facility] that prompted transfer to Nurse Triage: Diff Breathing Reason for Disposition  [1] Longstanding difficulty breathing (e.g., CHF, COPD, emphysema) AND [2] WORSE than normal  Answer Assessment - Initial Assessment Questions E2C2 Pulmonary Triage - Initial Assessment Questions "Chief Complaint (e.g., cough, sob, wheezing, fever, chills, sweat or additional symptoms) *Go to specific symptom protocol after initial questions. Difficulty breathing, hypoxia, chest tightness "I couldn't expand" - "it got to a point where I was gasping at [employer]"   "How long have symptoms been present?" yesterday  Have you tested for COVID or Flu? Note: If not, ask patient if a home test can be taken. If so, instruct patient to call back for positive results. No  MEDICINES:    "Have you used your inhalers/maintenance medication?" Yes If yes, "What medications?" Trelegy - 1 puff daily Ventolin  PRN - last used yesterday, minimal relief Duoneb - last took  yesterday  If inhaler, ask "How many puffs and how often?" Note: Review instructions on medication in the chart. See above  OXYGEN : "Do you wear supplemental oxygen ?" Yes If yes, "How many liters are you supposed to use?" 3L PRN  "Do you monitor your oxygen  levels?" Yes If yes, "What is your reading (oxygen  level) today?" 94 on 3L currently  "What is your usual oxygen  saturation reading?"  (Note: Pulmonary O2 sats should be 90% or greater) 78% during event High 90s at rest on RA   1. RESPIRATORY STATUS: "Describe your breathing?" (e.g., wheezing, shortness of breath, unable to speak, severe coughing)      *No Answer* 2. ONSET: "When did this breathing problem begin?"      *No Answer* 3. PATTERN "Does the difficult breathing come and go, or has it been constant since it started?"      *No Answer* 4. SEVERITY: "How bad is your breathing?" (e.g., mild, moderate, severe)    - MILD: No SOB at rest, mild SOB with walking, speaks normally in sentences, can lie down, no retractions, pulse < 100.    - MODERATE: SOB at rest, SOB with minimal exertion and prefers to sit, cannot lie down flat, speaks in phrases, mild retractions, audible wheezing, pulse 100-120.    - SEVERE: Very SOB at rest, speaks in single words, struggling to breathe, sitting hunched forward, retractions, pulse > 120      Mild-moderate - exertional SOB 5. RECURRENT SYMPTOM: "Have you had difficulty breathing before?" If Yes, ask: "When was the last time?" and "What happened that time?"      Yes, unsure when - reports prednisone  6. CARDIAC HISTORY: "Do you have any history of heart disease?" (e.g., heart attack, angina, bypass  surgery, angioplasty)      Negative per chart 7. LUNG HISTORY: "Do you have any history of lung disease?"  (e.g., pulmonary embolus, asthma, emphysema)     COPD 8. CAUSE: "What do you think is causing the breathing problem?"      Flare up 9. OTHER SYMPTOMS: "Do you have any other symptoms? (e.g.,  dizziness, runny nose, cough, chest pain, fever)     Denies, just dry cough  Protocols used: Breathing Difficulty-A-AH

## 2023-05-17 NOTE — Telephone Encounter (Signed)
 FYI pt has appt this afternoon with you

## 2023-05-18 NOTE — Assessment & Plan Note (Addendum)
 Quit smoking  2020  GOLD D COPD   FeV1 < 30%  DLCO < 30%  RV Elevated  04/12/2019 Ref to [facility] program 07/2019 Alpha 1 AT level NORMAL 147 08/01/2019 - Spirometry  07/24/19 FEV1 1.5 (36%)  Ratio 0.51  - 05/17/2023  After extensive coaching inhaler device,  effectiveness =    80% with hfa    Group D (now reclassified as E) in terms of symptom/risk and laba/lama/ICS  therefore appropriate rx at this point >>>  continue trelegy and approp saba.     He has major anxiety with spells that sound more like panic disorder or vcd where he passes out then comes back on his own s intervention breathing better and I would strongly support judicious use of clonazepam  here to prevent a cycle of anxiety driving RR up leading to more anxiety higher RR and more airtrapping leading to more anxiety/advised  Would also consider Ohtuvayre  here based on the the 2025 GOLD guidelines but he declined    In meantime can use round of prednisone  for this flare as has helped in past.  F/u planned at [facility], here prn and to ER in meantime if needed as nothing else to offer here.

## 2023-05-18 NOTE — Assessment & Plan Note (Signed)
 Sats 96% on room air 05/17/2023   HCO3 29 as of 03/15/23    Advised target 02 sats of 90% by adjusting 02 flow rates accordingly          Each maintenance medication was reviewed in detail including emphasizing most importantly the difference between maintenance and prns and under what circumstances the prns are to be triggered using an action plan format where appropriate.  Total time for H and P, chart review, counseling, reviewing hfa/dpi/02 /pulse  ox device(s) and generating customized AVS unique to this office visit / same day charting = 45 min acute visit with pt new to me

## 2023-05-23 ENCOUNTER — Other Ambulatory Visit (HOSPITAL_BASED_OUTPATIENT_CLINIC_OR_DEPARTMENT_OTHER): Payer: Self-pay

## 2023-05-23 ENCOUNTER — Ambulatory Visit (HOSPITAL_BASED_OUTPATIENT_CLINIC_OR_DEPARTMENT_OTHER): Admitting: Pulmonary Disease

## 2023-05-23 ENCOUNTER — Encounter (HOSPITAL_BASED_OUTPATIENT_CLINIC_OR_DEPARTMENT_OTHER): Payer: Self-pay | Admitting: Pulmonary Disease

## 2023-05-23 VITALS — BP 122/70 | HR 72 | Ht 73.0 in | Wt 178.9 lb

## 2023-05-23 DIAGNOSIS — J441 Chronic obstructive pulmonary disease with (acute) exacerbation: Secondary | ICD-10-CM

## 2023-05-23 DIAGNOSIS — Z87891 Personal history of nicotine dependence: Secondary | ICD-10-CM | POA: Diagnosis not present

## 2023-05-23 MED ORDER — PREDNISONE 10 MG PO TABS
ORAL_TABLET | ORAL | 0 refills | Status: AC
  Filled 2023-05-23: qty 40, 16d supply, fill #0

## 2023-05-23 NOTE — Progress Notes (Signed)
   Subjective:    Patient ID: Richard Fuentes, male    DOB: 1970-07-29, 53 y.o.   MRN: [id number]  HPI  53 year old heavy ex-smoker presents to establish care for severe emphysema Former patient of Dr. Brent Cambric He has been following up at [facility] for the past few years, I performed a detailed chart review of his records from [place]. He reports high symptom burden with dyspnea on exertion on minimal activity and chronic cough He smoked 2 packs/day until he quit in 2020, 70 pack yrs   03/2020 underwent bronchoscopic lung volume reduction with Zephyr endobronchial valve placement x 4 to the left upper lobe,complicated by left-sided pneumothorax    01/15/21  replacement of the apico-posterior segment valves in the LUL.    He has been evaluated by [facility] and thoracic surgery.  Felt not to be candidate for repeat endobronchial valves and felt to be a candidate for LV RS but he is hesitant to proceed.  He is contemplating other elective surgeries finished such as teeth extraction and a hernia repair.  Plan is for endobronchial valve removal after he completes the surgeries.  He has undergone transplant evaluation for turndown due to his severe OCD and absence of 2 caregivers.  At any rate he does not want to pursue transplant   He has completed pulmonary rehab in the past and now does his own exercise program   Significant tests/ events reviewed   CT chest 10/2022 right lower lobe nodule resolved, severe apical emphysema left upper lobe bronchial valve present, migrated 07/2019 alpha-1 antitrypsin negative   04/2019 PFTs FEV1 1.25/30%, DLCO 9.87/31%  Review of Systems neg for any significant sore throat, dysphagia, itching, sneezing, nasal congestion or excess/ purulent secretions, fever, chills, sweats, unintended wt loss, pleuritic or exertional cp, hempoptysis, orthopnea pnd or change in chronic leg swelling. Also denies presyncope, palpitations, heartburn, abdominal pain, nausea, vomiting, diarrhea or  change in bowel or urinary habits, dysuria,hematuria, rash, arthralgias, visual complaints, headache, numbness weakness or ataxia.     Objective:   Physical Exam   Gen. Pleasant, well-nourished, in no distress ENT - no thrush, no pallor/icterus,no post nasal drip Neck: No JVD, no thyromegaly, no carotid bruits Lungs: no use of accessory muscles, no dullness to percussion, decreased BL without rales or rhonchi  Cardiovascular: Rhythm regular, heart sounds  normal, no murmurs or gallops, no peripheral edema Musculoskeletal: No deformities, no cyanosis or clubbing         Assessment & Plan:   Severe COPD with exacerbations Severe COPD with recent exacerbations characterized by dyspnea, chest tightness, and hypoxemia. Recent exacerbation resulted in oxygen  saturation dropping to 78%, triggered by environmental factors such as smoke and weather changes. Current treatment with Trelegy may be insufficient. He experiences significant improvement with prednisone  but reports mood changes. BiPAP is not indicated due to normal carbon dioxide levels. He prefers short-term treatments due to fear of long-lasting medications. Discussed potential benefit of nebulized medicationPDE 5 inhibitor as an add-on therapy, which operates through a different pathway than current medications. - Prescribe nebulized medication Ohtuwayrer twice daily as an add-on therapy. - Send prescription for prednisone  with instructions for use during exacerbations. - File for approval of nebulized medication. - Provide educational materials on Ohtuwayre - Discuss potential use of BiPAP -he does not feel he will be able to use nocturnal BiPAP

## 2023-05-23 NOTE — Patient Instructions (Addendum)
 X Start approval process for Ohtuwayre neb twice daily  You will need ABG to check eligibility for BiPAP  X Prednisone  Rx in your pharmacy

## 2023-05-26 ENCOUNTER — Telehealth: Payer: Self-pay

## 2023-05-26 NOTE — Telephone Encounter (Signed)
 Received Ohtuvayre new start paperwork. Completed form and faxed with clinicals and insurance card copy to [facility]   Phone#: [phone] Fax#: [phone]

## 2023-06-05 DIAGNOSIS — J449 Chronic obstructive pulmonary disease, unspecified: Secondary | ICD-10-CM | POA: Diagnosis not present

## 2023-06-06 ENCOUNTER — Telehealth (HOSPITAL_BASED_OUTPATIENT_CLINIC_OR_DEPARTMENT_OTHER): Payer: Self-pay

## 2023-06-06 NOTE — Telephone Encounter (Signed)
 Submitted a Prior Authorization request to [facility] for OHTUVAYRE  via CoverMyMeds. Will update once we receive a response.  Key: [id number]  Per response on CMM: Available without authorization.  Geraldene Kleine, PharmD, MPH, BCPS, CPP Clinical Pharmacist (Rheumatology and Pulmonology)

## 2023-06-06 NOTE — Telephone Encounter (Signed)
 PA submitted for Ohtuvayre  (follow-up in Ohtuvayre  encounter)

## 2023-06-09 IMAGING — CR DG CHEST 2V
2 series · 2 of 2 positions shown · non-contrast
Comparison: 11/30/2020

CLINICAL DATA: Bronchial valves placed in the left upper lobe.
Surgery 1 week ago. Worsening shortness of breath. Rule out
pneumothorax.

EXAM:
CHEST - 2 VIEW

[w chest pa]
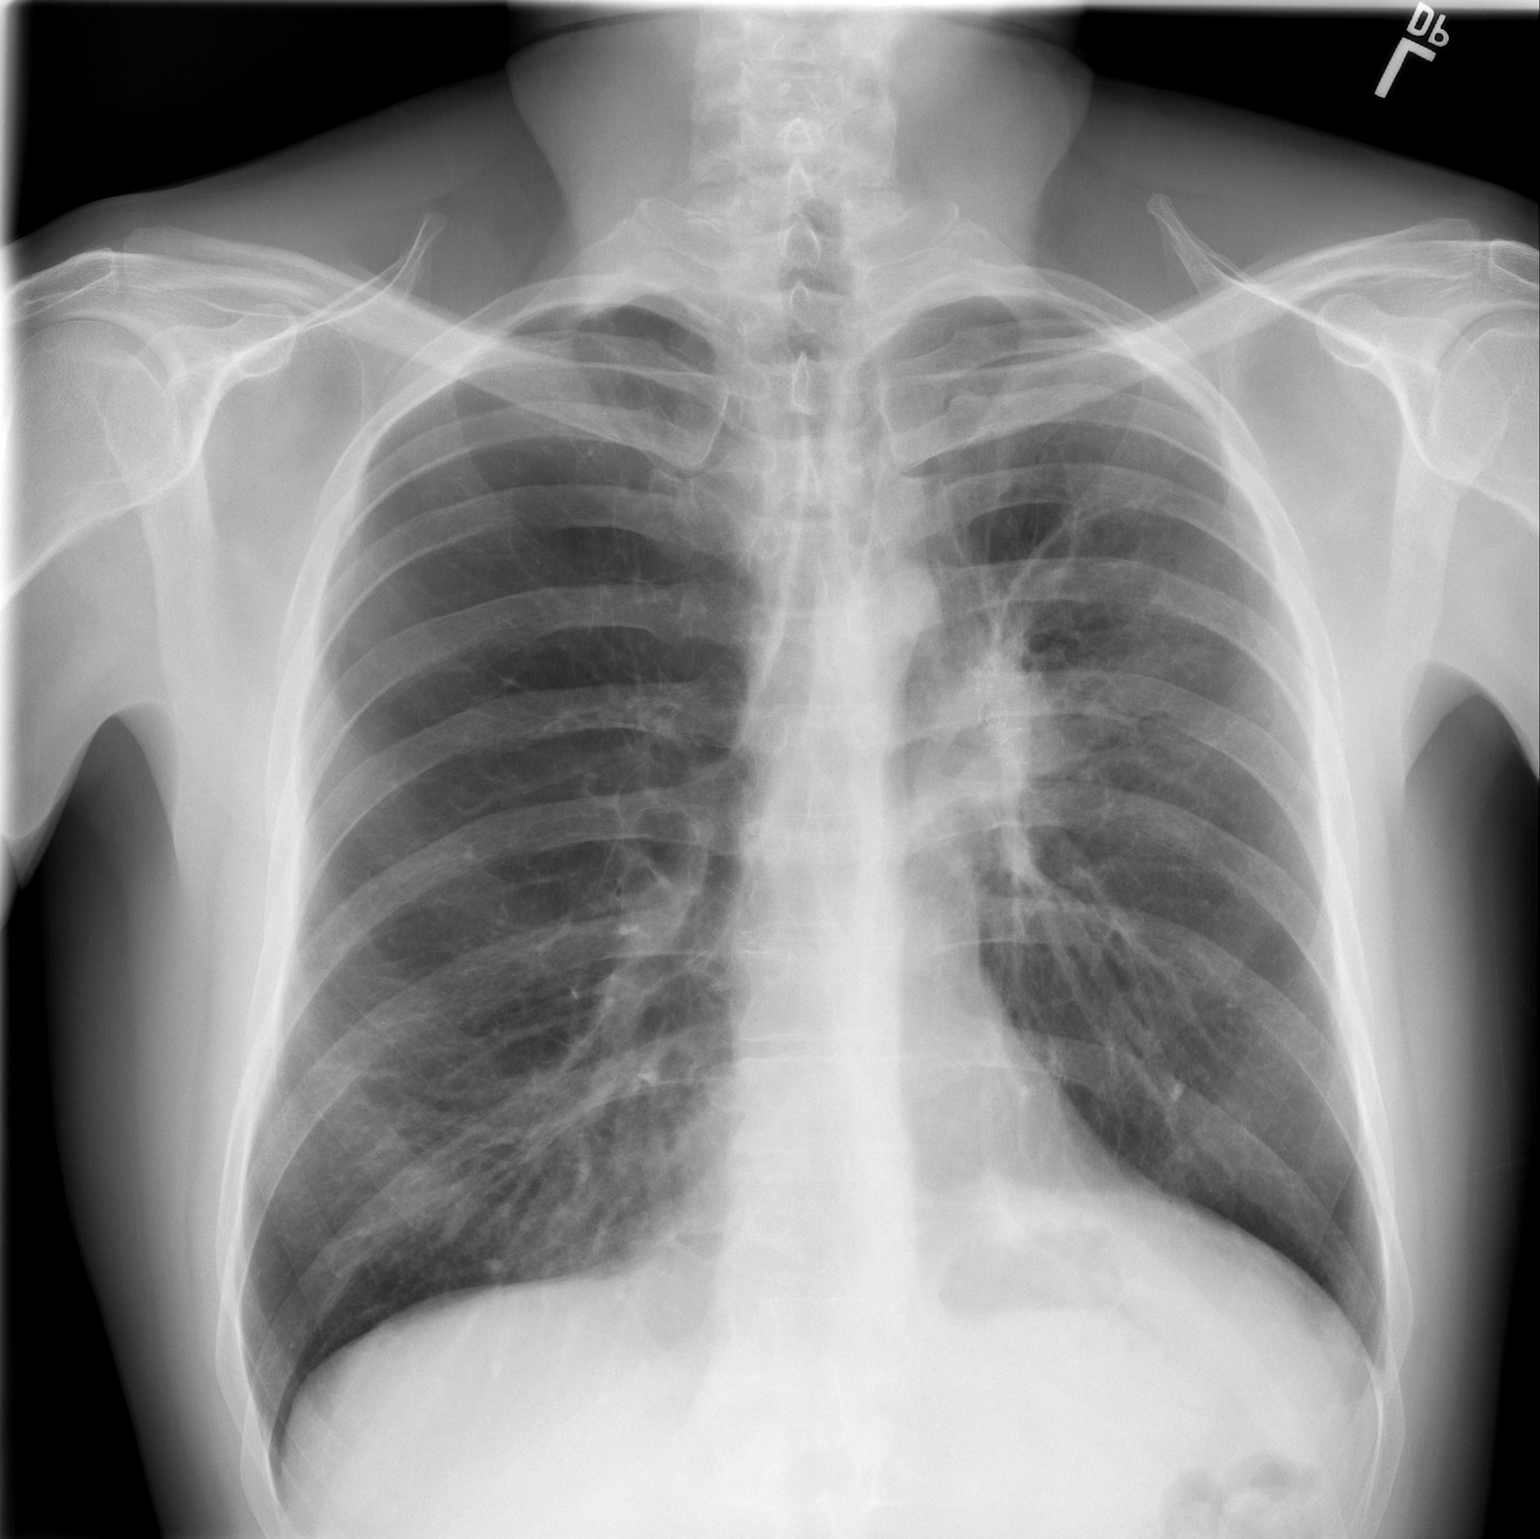

[w chest lat]
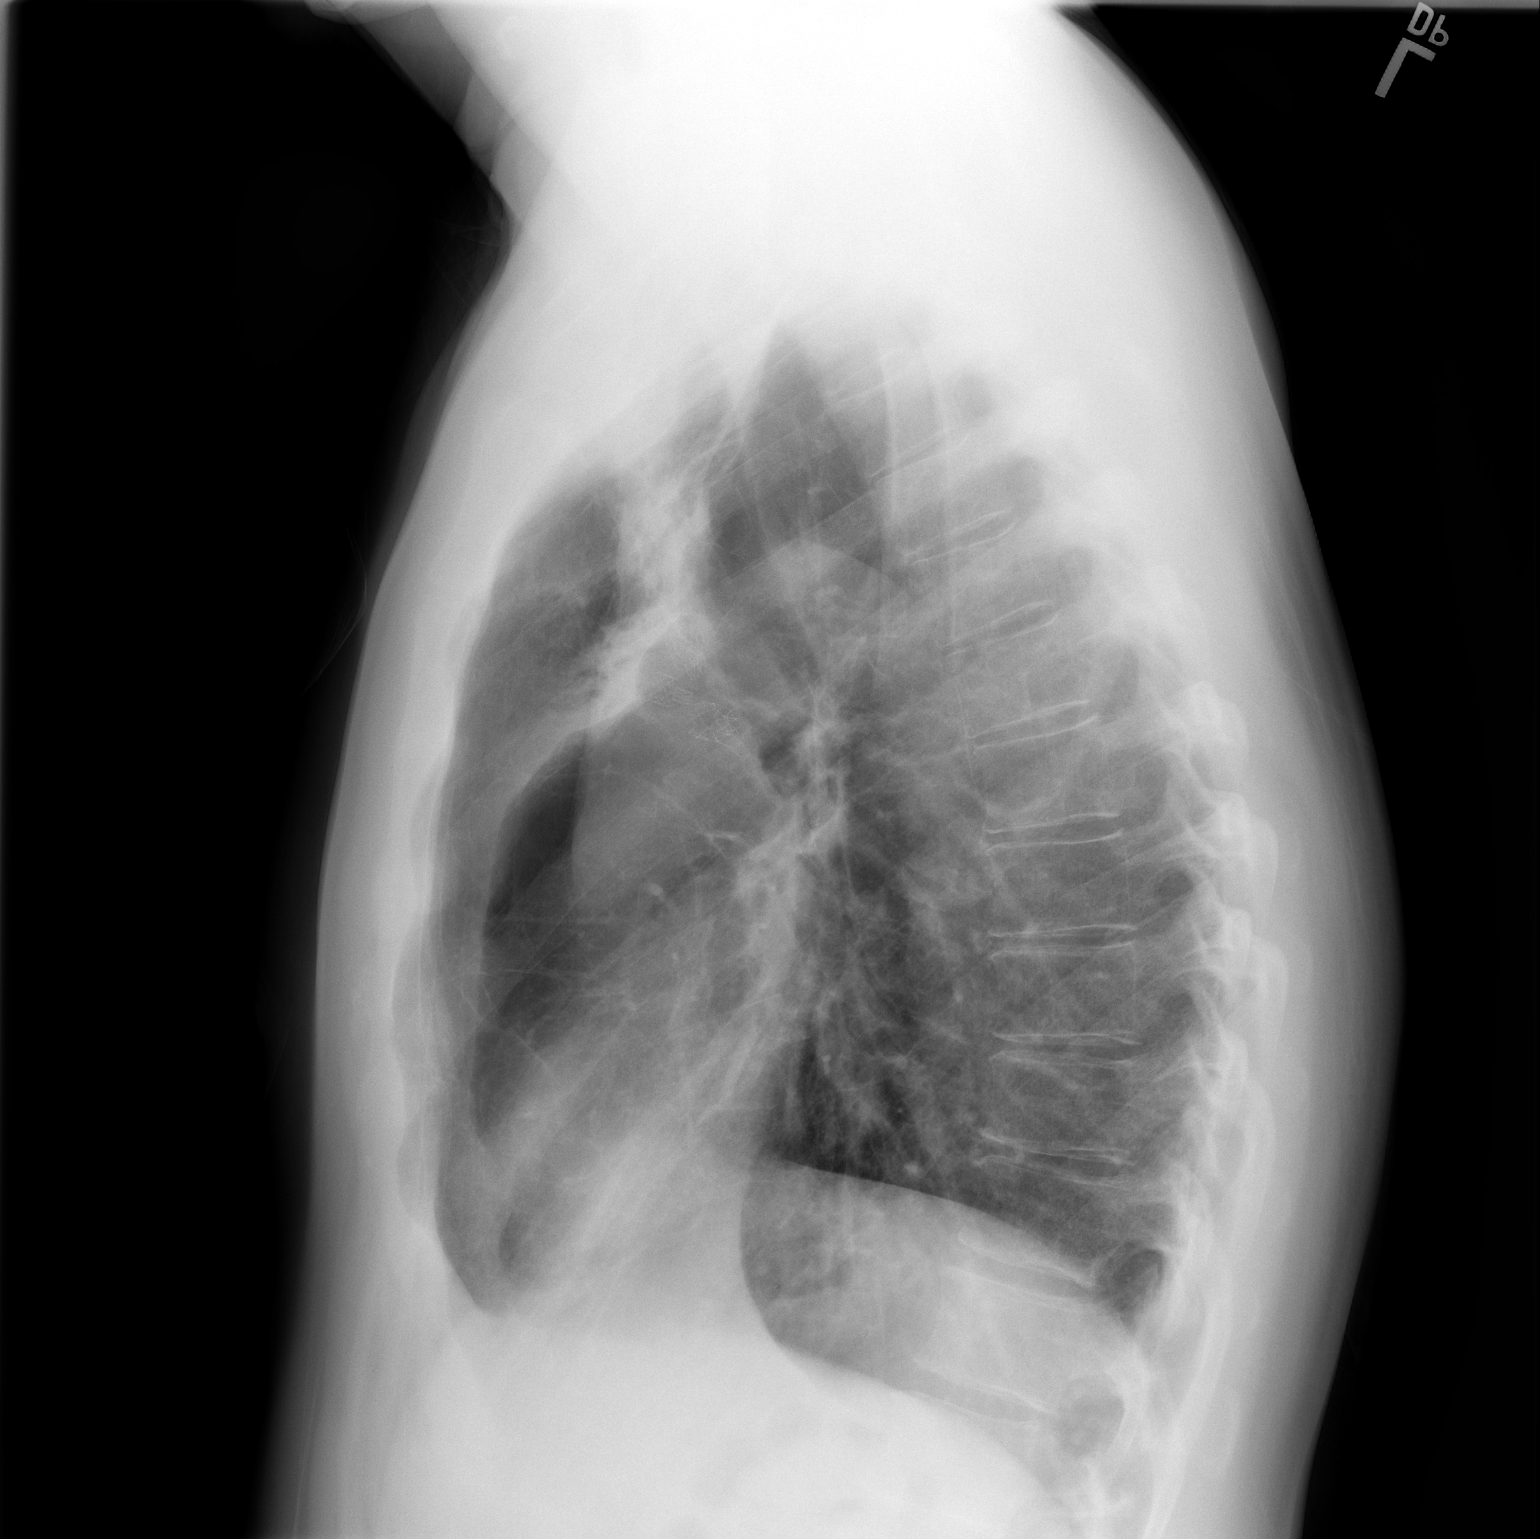

[2 of 2 positions shown; findings below may reference images not displayed]

FINDINGS: Heart size remains normal. Background emphysema remains evident.
Multiple bronchial valve devices are noted in the left suprahilar
region. There is more volume loss in the left hilar and suprahilar
region than was seen previously. No effusion. No acute bone finding.
IMPRESSION: Multiple bronchial valves present in the left suprahilar region.
Increased volume loss in the left upper lung. No pneumothorax.

## 2023-06-27 ENCOUNTER — Encounter (HOSPITAL_BASED_OUTPATIENT_CLINIC_OR_DEPARTMENT_OTHER): Payer: Self-pay | Admitting: Pulmonary Disease

## 2023-06-27 DIAGNOSIS — J441 Chronic obstructive pulmonary disease with (acute) exacerbation: Secondary | ICD-10-CM

## 2023-06-27 DIAGNOSIS — J431 Panlobular emphysema: Secondary | ICD-10-CM

## 2023-06-28 IMAGING — DX DG CHEST 1V PORT
1 series · 2 of 2 positions shown · non-contrast
Comparison: 01/22/2021

CLINICAL DATA: Shortness of breath

EXAM:
PORTABLE CHEST 1 VIEW

[Series 1: chest · 0.14mm/px · 2 of 2 slices shown]
[im 1/2]
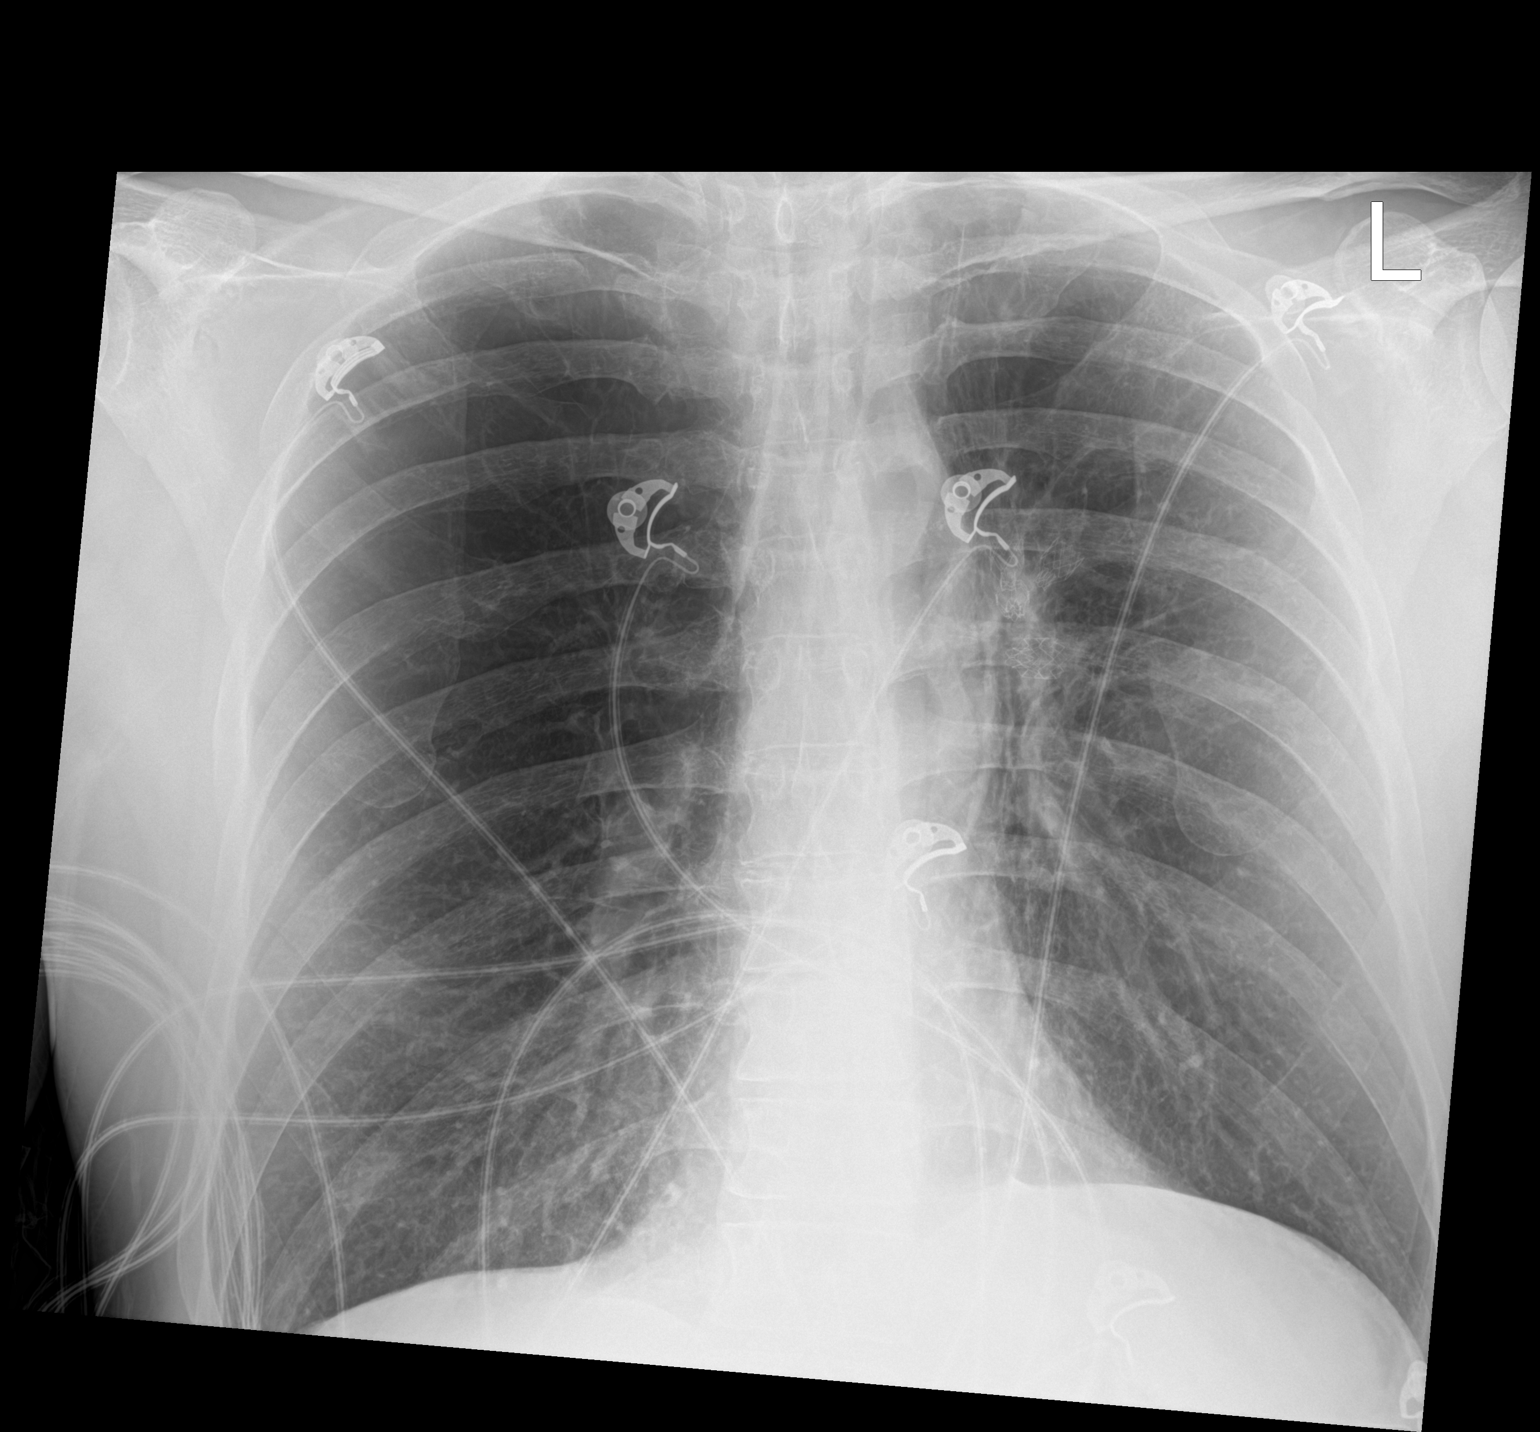
[im 2/2]
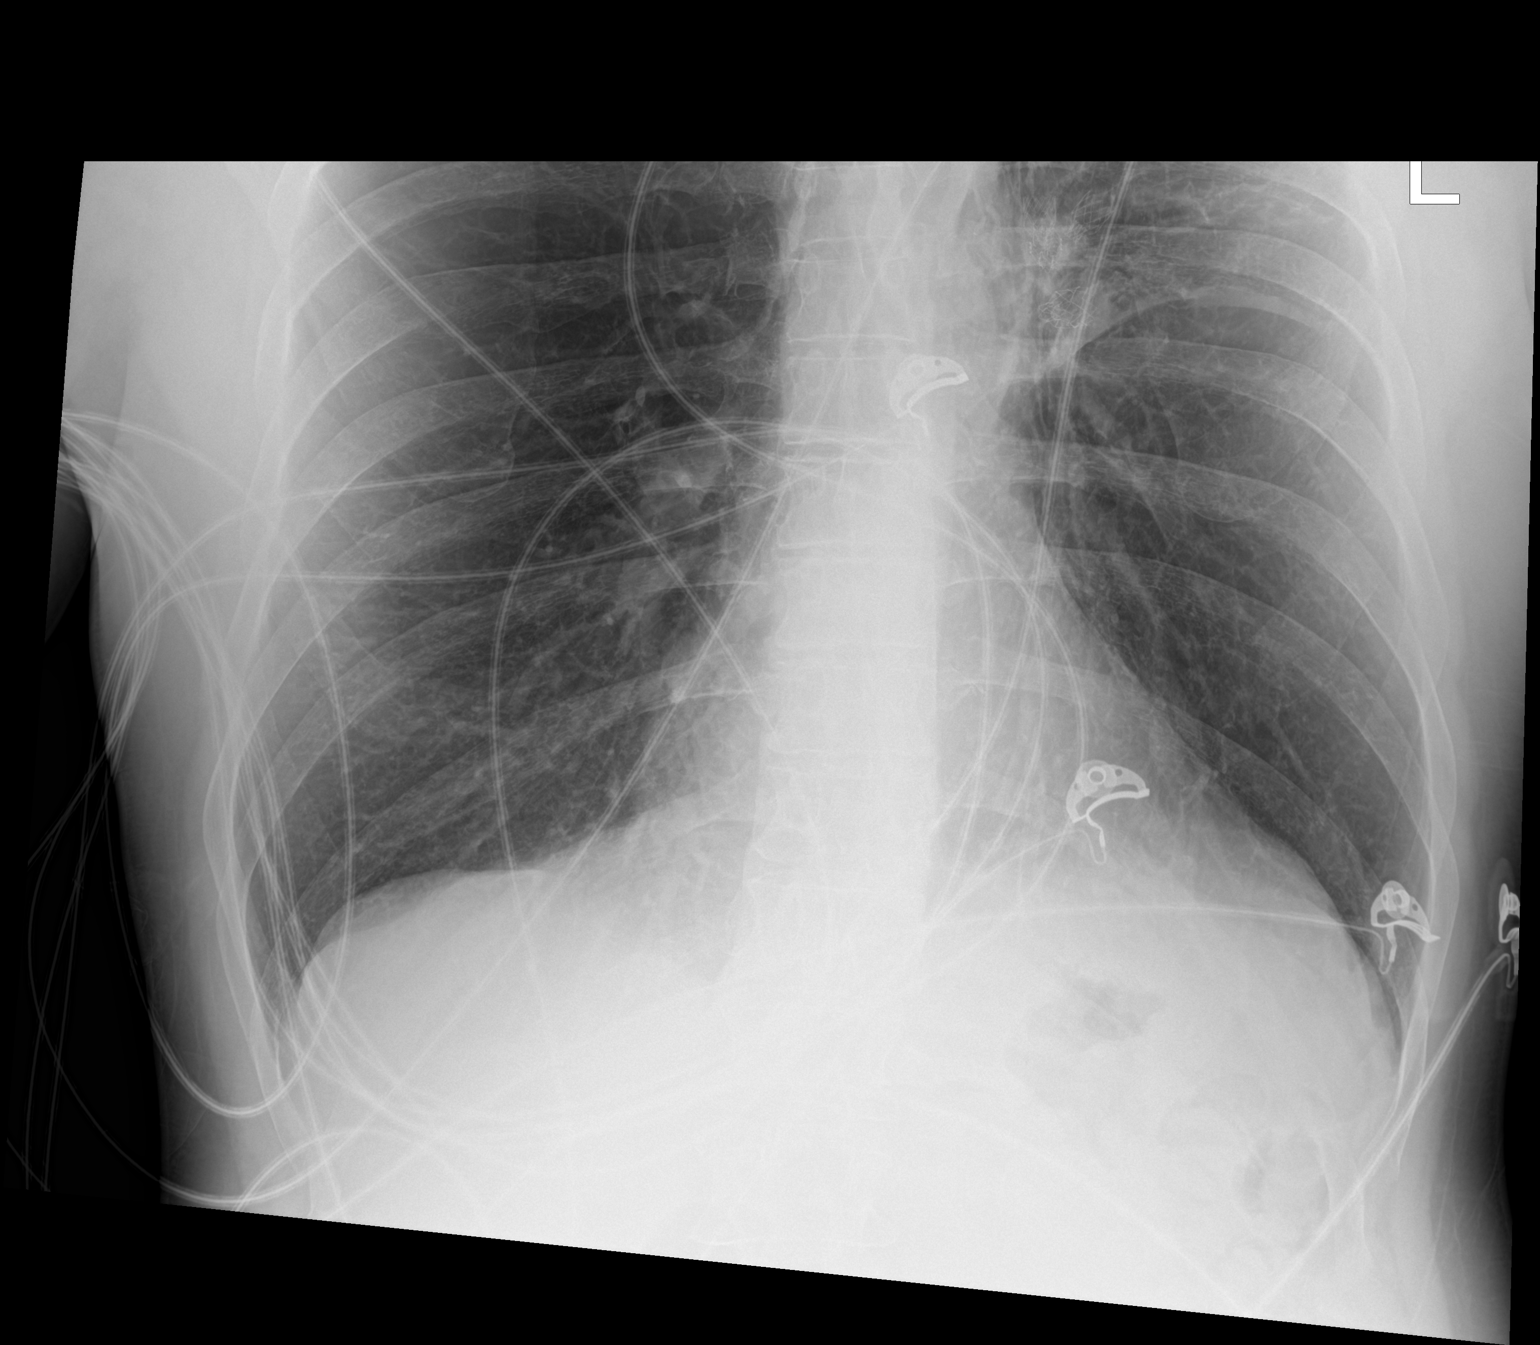

[2 of 2 positions shown; findings below may reference images not displayed]

FINDINGS: Hyperinflation with asymmetric right apical lucency. Similar
positioning of bronchial valves at the left hilum with mild volume
loss. Normal heart size and stable mediastinal contours. There is no
edema, consolidation, effusion, or pneumothorax.
IMPRESSION: 1. Bronchial valves on the left with stable mild volume loss. No
pneumothorax.
2. Emphysema.

## 2023-07-01 DIAGNOSIS — J449 Chronic obstructive pulmonary disease, unspecified: Secondary | ICD-10-CM | POA: Diagnosis not present

## 2023-07-05 ENCOUNTER — Other Ambulatory Visit (HOSPITAL_BASED_OUTPATIENT_CLINIC_OR_DEPARTMENT_OTHER): Payer: Self-pay

## 2023-07-05 ENCOUNTER — Other Ambulatory Visit: Payer: Self-pay

## 2023-07-05 DIAGNOSIS — J449 Chronic obstructive pulmonary disease, unspecified: Secondary | ICD-10-CM | POA: Diagnosis not present

## 2023-07-05 NOTE — Telephone Encounter (Signed)
 Please advise as Beths note from 03/25/2023 states he did not qualify only for HS 02 it states she following:You should use nocturnal oxygen . Your oxygen  stay at 99% on room air after walking 750 feet. Continue to monitor levels at home. But todays walk test was reassuring

## 2023-07-06 NOTE — Telephone Encounter (Signed)
 Pt.notified

## 2023-07-15 ENCOUNTER — Other Ambulatory Visit (HOSPITAL_BASED_OUTPATIENT_CLINIC_OR_DEPARTMENT_OTHER): Payer: Self-pay

## 2023-08-02 ENCOUNTER — Other Ambulatory Visit: Payer: Self-pay

## 2023-08-02 ENCOUNTER — Other Ambulatory Visit: Payer: Self-pay | Admitting: Critical Care Medicine

## 2023-08-02 ENCOUNTER — Other Ambulatory Visit: Payer: Self-pay | Admitting: Family Medicine

## 2023-08-02 ENCOUNTER — Other Ambulatory Visit (HOSPITAL_BASED_OUTPATIENT_CLINIC_OR_DEPARTMENT_OTHER): Payer: Self-pay

## 2023-08-02 DIAGNOSIS — J9611 Chronic respiratory failure with hypoxia: Secondary | ICD-10-CM

## 2023-08-02 DIAGNOSIS — J431 Panlobular emphysema: Secondary | ICD-10-CM

## 2023-08-02 MED ORDER — ALBUTEROL SULFATE HFA 108 (90 BASE) MCG/ACT IN AERS
2.0000 | INHALATION_SPRAY | RESPIRATORY_TRACT | 2 refills | Status: DC | PRN
Start: 2023-08-02 — End: 2023-11-14
  Filled 2023-08-02: qty 18, 25d supply, fill #0
  Filled 2023-08-10 – 2023-08-25 (×5): qty 18, 25d supply, fill #1
  Filled 2023-10-06: qty 18, 25d supply, fill #2

## 2023-08-03 ENCOUNTER — Other Ambulatory Visit (HOSPITAL_BASED_OUTPATIENT_CLINIC_OR_DEPARTMENT_OTHER): Payer: Self-pay

## 2023-08-03 ENCOUNTER — Encounter (HOSPITAL_BASED_OUTPATIENT_CLINIC_OR_DEPARTMENT_OTHER): Payer: Self-pay

## 2023-08-05 DIAGNOSIS — J449 Chronic obstructive pulmonary disease, unspecified: Secondary | ICD-10-CM | POA: Diagnosis not present

## 2023-08-09 DIAGNOSIS — M9913 Subluxation complex (vertebral) of lumbar region: Secondary | ICD-10-CM | POA: Diagnosis not present

## 2023-08-09 DIAGNOSIS — M9912 Subluxation complex (vertebral) of thoracic region: Secondary | ICD-10-CM | POA: Diagnosis not present

## 2023-08-09 DIAGNOSIS — M9911 Subluxation complex (vertebral) of cervical region: Secondary | ICD-10-CM | POA: Diagnosis not present

## 2023-08-09 DIAGNOSIS — M9914 Subluxation complex (vertebral) of sacral region: Secondary | ICD-10-CM | POA: Diagnosis not present

## 2023-08-10 ENCOUNTER — Other Ambulatory Visit (HOSPITAL_BASED_OUTPATIENT_CLINIC_OR_DEPARTMENT_OTHER): Payer: Self-pay

## 2023-08-14 ENCOUNTER — Other Ambulatory Visit (HOSPITAL_BASED_OUTPATIENT_CLINIC_OR_DEPARTMENT_OTHER): Payer: Self-pay

## 2023-08-19 ENCOUNTER — Other Ambulatory Visit (HOSPITAL_BASED_OUTPATIENT_CLINIC_OR_DEPARTMENT_OTHER): Payer: Self-pay

## 2023-08-25 ENCOUNTER — Ambulatory Visit (HOSPITAL_BASED_OUTPATIENT_CLINIC_OR_DEPARTMENT_OTHER): Admitting: Pulmonary Disease

## 2023-08-25 ENCOUNTER — Other Ambulatory Visit (HOSPITAL_BASED_OUTPATIENT_CLINIC_OR_DEPARTMENT_OTHER): Payer: Self-pay

## 2023-08-25 ENCOUNTER — Telehealth: Payer: Self-pay

## 2023-08-25 ENCOUNTER — Encounter (HOSPITAL_BASED_OUTPATIENT_CLINIC_OR_DEPARTMENT_OTHER): Payer: Self-pay | Admitting: Pulmonary Disease

## 2023-08-25 VITALS — BP 96/67 | HR 86 | Ht 73.0 in | Wt 175.8 lb

## 2023-08-25 DIAGNOSIS — J4489 Other specified chronic obstructive pulmonary disease: Secondary | ICD-10-CM | POA: Diagnosis not present

## 2023-08-25 MED ORDER — PREDNISONE 10 MG PO TABS
ORAL_TABLET | ORAL | 0 refills | Status: AC
  Filled 2023-08-25: qty 40, 16d supply, fill #0

## 2023-08-25 NOTE — Progress Notes (Signed)
 Subjective:    Patient ID: Richard Fuentes, male    DOB: 05/29/1970, 53 y.o.   MRN: [id number]   53 year old heavy ex-smoker  for FU of severe emphysema Former patient of Dr. Brien He followed  at [facility] for many  years He reports high symptom burden with dyspnea on exertion on minimal activity and chronic cough He smoked 2 packs/day until he quit in 2020, 70 pack yrs   03/2020 underwent bronchoscopic lung volume reduction with Zephyr endobronchial valve placement x 4 to the left upper lobe,complicated by left-sided pneumothorax    01/15/21  replacement of the apico-posterior segment valves in the LUL.    He was evaluated by [facility] and thoracic surgery.  Felt not to be candidate for repeat endobronchial valves and felt to be a candidate for LV RS but he is hesitant to proceed.  He is contemplating other elective surgeries finished such as teeth extraction and a hernia repair.  Plan is for endobronchial valve removal after he completes the surgeries.  He underwent transplant evaluation  turned down due to his severe OCD and absence of 2 caregivers.  At any rate he does not want to pursue transplant   He completed pulmonary rehab in the past and now does his own exercise program     Significant tests/ events reviewed   CT chest 10/2022 right lower lobe nodule resolved, severe apical emphysema left upper lobe bronchial valve present, migrated 07/2019 alpha-1 antitrypsin negative   04/2019 PFTs FEV1 1.25/30%, DLCO 9.87/31% Discussed the use of AI scribe software for clinical note transcription with the patient, who gave verbal consent to proceed.  History of Present Illness   Discussed the use of AI scribe software for clinical note transcription with the patient, who gave verbal consent to proceed.  History of Present Illness   Richard Fuentes is a 53 year old male with severe emphysema who presents for follow-up after bronchial valve placement.  He is maintained on Trelegy and is concerned  about losing medication coverage due to changes in Medicaid eligibility. He is stressed about the potential high cost of Trelegy without coverage. He uses Trelegy, albuterol , and nebulizer treatments regularly but experiences worsening symptoms with daily exacerbations. These exacerbations affect his ability to perform routine activities like walking, doing laundry, and dishes, making him feel like he is 'sprinting'. He has concerns about nocturnal 'attacks' where he cannot catch his breath, which he manages by staying calm and using his nebulizer and oxygen . He previously tried [employer] that caused gastrointestinal side effects, including diarrhea and inability to keep food down, leading to discontinuation after a week. He has two boxes of this medication and is considering trying it again with Imodium to manage side effects.      He asks my opinion about LVRS     Review of Systems  neg for any significant sore throat, dysphagia, itching, sneezing, nasal congestion or excess/ purulent secretions, fever, chills, sweats, unintended wt loss, pleuritic or exertional cp, hempoptysis, orthopnea pnd or change in chronic leg swelling. Also denies presyncope, palpitations, heartburn, abdominal pain, nausea, vomiting, diarrhea or change in bowel or urinary habits, dysuria,hematuria, rash, arthralgias, visual complaints, headache, numbness weakness or ataxia.      Objective:   Physical Exam  Gen. Pleasant, well-nourished, in no distress ENT - no thrush, no pallor/icterus,no post nasal drip Neck: No JVD, no thyromegaly, no carotid bruits Lungs: no use of accessory muscles, no dullness to percussion, clear without rales or rhonchi  Cardiovascular: Rhythm  regular, heart sounds  normal, no murmurs or gallops, no peripheral edema Musculoskeletal: No deformities, no cyanosis or clubbing        Assessment & Plan:   Assessment and Plan Assessment & Plan   Assessment and Plan    Severe  emphysema Severe emphysema with ongoing symptoms despite current treatment regimen. Maintained on Trelegy, albuterol , and nebulizer treatments. Reports daily symptoms and difficulty with physical activities, indicating a high symptom burden. Previous bronchial valve procedure was not successful due to scar tissue and air trapping. Discussed potential re-challenge with nebulized medication (O2 Wear) despite previous adverse effects (diarrhea). - Prescribe prednisone  for exacerbations. - Encourage re-challenge with nebulized medication Ardyce) with concurrent use of Imodium to manage diarrhea. - Discuss potential referral to palliative care for symptom management and advance care planning.  Obsessive-compulsive disorder and anxiety disorder Ongoing anxiety and OCD symptoms, exacerbated by current health and social stressors. Reports isolation and significant stress related to health and financial situation. Discussed potential benefit of mental health support and counseling. - Recommend seeing a mental health professional for counseling and support.

## 2023-08-25 NOTE — Patient Instructions (Signed)
 Prednisone  10 mg tabs Take 4 tabs  daily with food x 4 days, then 3 tabs daily x 4 days, then 2 tabs daily x 4 days, then 1 tab daily x4 days then stop. #40   X Try ohtuwayre again

## 2023-08-25 NOTE — Telephone Encounter (Signed)
 Copied from CRM [id number]. Topic: Referral - Status >> Aug 25, 2023  3:00 PM [place] J wrote: Reason for CRM: Tylene calling from [employer] wanting to let Corean know thast they have received the referral for palliative care and [facility] will be following up. For questions, please call the number below:  Sheri: [phone]

## 2023-08-25 NOTE — Telephone Encounter (Signed)
 Patients Pulmonologist placed Palliative referral.

## 2023-08-29 ENCOUNTER — Other Ambulatory Visit (HOSPITAL_BASED_OUTPATIENT_CLINIC_OR_DEPARTMENT_OTHER): Payer: Self-pay

## 2023-08-30 ENCOUNTER — Other Ambulatory Visit (HOSPITAL_BASED_OUTPATIENT_CLINIC_OR_DEPARTMENT_OTHER): Payer: Self-pay

## 2023-08-30 ENCOUNTER — Encounter: Payer: Self-pay | Admitting: Family Medicine

## 2023-08-30 ENCOUNTER — Ambulatory Visit: Admitting: Family Medicine

## 2023-08-30 ENCOUNTER — Other Ambulatory Visit: Payer: Self-pay

## 2023-08-30 VITALS — BP 108/76 | HR 73 | Temp 98.6°F | Ht 73.0 in | Wt 180.0 lb

## 2023-08-30 DIAGNOSIS — J9611 Chronic respiratory failure with hypoxia: Secondary | ICD-10-CM

## 2023-08-30 DIAGNOSIS — L400 Psoriasis vulgaris: Secondary | ICD-10-CM | POA: Diagnosis not present

## 2023-08-30 DIAGNOSIS — F411 Generalized anxiety disorder: Secondary | ICD-10-CM

## 2023-08-30 DIAGNOSIS — T148XXA Other injury of unspecified body region, initial encounter: Secondary | ICD-10-CM | POA: Diagnosis not present

## 2023-08-30 DIAGNOSIS — F331 Major depressive disorder, recurrent, moderate: Secondary | ICD-10-CM

## 2023-08-30 DIAGNOSIS — M542 Cervicalgia: Secondary | ICD-10-CM | POA: Insufficient documentation

## 2023-08-30 MED ORDER — ROFLUMILAST 0.3 % EX CREA
1.0000 | TOPICAL_CREAM | Freq: Every day | CUTANEOUS | 2 refills | Status: AC
  Filled 2023-08-30: qty 60, 30d supply, fill #0
  Filled 2023-10-06: qty 60, 30d supply, fill #1
  Filled 2023-12-26: qty 60, 30d supply, fill #2

## 2023-08-30 MED ORDER — METHOCARBAMOL 500 MG PO TABS
500.0000 mg | ORAL_TABLET | Freq: Three times a day (TID) | ORAL | 0 refills | Status: AC | PRN
Start: 2023-08-30 — End: ?
  Filled 2023-08-30: qty 30, 10d supply, fill #0

## 2023-08-30 NOTE — Patient Instructions (Addendum)
 I have attached some exercises for your neck today.  May use your TENS unit as often as you'd like.   May use heat or ice to the area for relief as needed.  May use topical rubs or gels to the area as needed for relief.  Attached subtle stretching exercises to help relieve pain and strengthen muscles.   Follow-up with me for new or worsening symptoms.  May try methocarbamol  for muscle spasms.  I have sent in a muscle relaxer for you to use one tablet as needed for muscle spasms. This medication can make you sleepy. Do not drive until you know how this medication affects you.   Follow-up with me for new or worsening symptoms.

## 2023-08-30 NOTE — Progress Notes (Deleted)
   Acute Office Visit  Subjective:     Patient ID: [place] Mer[place], male    DOB: [birth year], 53 y.o.   MRN: [id number]  No chief complaint on file.   HPI  Discussed the use of AI scribe software for clinical note transcription with the patient, who gave verbal consent to proceed.  History of Present Illness      ROS Per HPI      Objective:    There were no vitals taken for this visit.   Physical Exam Vitals and nursing note reviewed.  Constitutional:      General: He is not in acute distress.    Appearance: Normal appearance.  HENT:     Head: Normocephalic and atraumatic.     Right Ear: External ear normal.     Left Ear: External ear normal.     Nose: Nose normal.     Mouth/Throat:     Mouth: Mucous membranes are moist.     Pharynx: Oropharynx is clear.  Eyes:     Extraocular Movements: Extraocular movements intact.  Cardiovascular:     Rate and Rhythm: Normal rate and regular rhythm.     Pulses: Normal pulses.     Heart sounds: Normal heart sounds.  Pulmonary:     Effort: Pulmonary effort is normal. No respiratory distress.     Breath sounds: Normal breath sounds. No wheezing, rhonchi or rales.  Musculoskeletal:        General: Normal range of motion.     Cervical back: Normal range of motion.     Right lower leg: No edema.     Left lower leg: No edema.  Lymphadenopathy:     Cervical: No cervical adenopathy.  Skin:    General: Skin is warm and dry.  Neurological:     General: No focal deficit present.     Mental Status: He is alert and oriented to person, place, and time.  Psychiatric:        Mood and Affect: Mood normal.        Behavior: Behavior normal.     No results found for any visits on 08/30/23.      Assessment & Plan:   Assessment and Plan Assessment & Plan      No orders of the defined types were placed in this encounter.    No orders of the defined types were placed in this encounter.   No follow-ups on  file.  Corean LITTIE Ku, FNP

## 2023-08-30 NOTE — Progress Notes (Signed)
 Acute Office Visit  Subjective:     Patient ID: Richard Fuentes, male    DOB: [birth year], 53 y.o.   MRN: [id number]  No chief complaint on file.   HPI  Discussed the use of AI scribe software for clinical note transcription with the patient, who gave verbal consent to proceed.  History of Present Illness Richard Fuentes is a 53 year old male with anxiety and chronic neck pain who presents with worsening headaches and neck stiffness.  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 without pain - No use of prescribed prednisone , but he does have a course at home  Anxiety and sleep disturbance - Increased anxiety following the loss of his emotional support cat - Decreased physical activity - Poor sleep quality - No current use of medication for anxiety - Increased stress related to potential loss of Medicaid coverage due to Social Security Administration income review - Does not qualify for disability and receives minimal SSI  Psoriatic lesions - Psoriasis with new lesions on scalp and elbows - Use of special shampoo for management  Respiratory symptoms - Respiratory issues managed with a nebulizer  Abdominal discomfort - Hernia causing discomfort, especially when coughing     ROS Per HPI      Objective:    BP 108/76 (BP Location: Left Arm, Patient Position: Sitting)   Pulse 73   Temp 98.6 F (37 C) (Temporal)   Ht 6' 1 (1.854 m)   Wt 180 lb (81.6 kg)   SpO2 90%   BMI 23.75 kg/m    Physical Exam Vitals and nursing note reviewed.  Constitutional:      General: He is not in acute distress.    Comments: Chronically ill appearing   HENT:     Head: Normocephalic and atraumatic.     Right Ear: External ear normal.     Left Ear: External ear normal.     Nose: Nose normal.     Mouth/Throat:     Mouth:  Mucous membranes are moist.     Pharynx: Oropharynx is clear.  Eyes:     Extraocular Movements: Extraocular movements intact.  Neck:     Comments: LROM to neck, supple, L trapezium mildly swollen, tender, in spasm. No bruising, no erythema, no heat, no obvious deformity Cardiovascular:     Rate and Rhythm: Normal rate and regular rhythm.     Pulses: Normal pulses.     Heart sounds: Normal heart sounds.  Pulmonary:     Effort: Pulmonary effort is normal. No respiratory distress.     Breath sounds: No decreased air movement. Examination of the right-upper field reveals wheezing and rhonchi. Examination of the left-upper field reveals wheezing and rhonchi. Examination of the right-middle field reveals decreased breath sounds. Examination of the left-middle field reveals decreased breath sounds. Examination of the right-lower field reveals decreased breath sounds. Examination of the left-lower field reveals decreased breath sounds. Decreased breath sounds, wheezing and rhonchi present. No rales.     Comments: Productive cough in office Musculoskeletal:        General: Normal range of motion.     Right lower leg: No edema.     Left lower leg: No edema.  Lymphadenopathy:     Cervical: No cervical adenopathy.  Skin:    General: Skin is warm and dry.  Neurological:  General: No focal deficit present.     Mental Status: He is alert and oriented to person, place, and time.  Psychiatric:        Mood and Affect: Mood normal.        Behavior: Behavior normal.     No results found for any visits on 08/30/23.      Assessment & Plan:   Assessment and Plan Assessment & Plan Cervicalgia Chronic neck pain possibly due to stress or poor posture, with ineffective current treatments. Differential includes pinched nerve or stress-related tension. - Provide neck stretches. - Recommend TENS unit with new sticky pads. - Continue prednisone  for inflammation and relief during flare-ups. - Discuss  maintenance dose of prednisone .  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 Considering topical roflumilast , concerns about Skyrizi due to respiratory infection risk with severe COPD - Consider trial of topical roflumilast . - Continue current shampoo for scalp psoriasis. - Discuss potential risks and benefits of Skyrizi.  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 - Continue COPD management with nebulizer and prednisone  as needed. - Discuss potential side effects of roflumilast  and consider if benefits outweigh risks. - Explore options for maintaining Medicaid coverage or alternative healthcare programs.     No orders of the defined types were placed in this encounter.    Meds ordered this encounter  Medications   methocarbamol  (ROBAXIN ) 500 MG tablet    Sig: Take 1 tablet (500 mg total) by mouth every 8 (eight) hours as needed for muscle spasms.    Dispense:  30 tablet    Refill:  0   Roflumilast  0.3 % CREA    Sig: Apply 1 Application topically daily.    Dispense:  60 g    Refill:  2    Return if symptoms worsen or fail to improve.  Corean LITTIE Ku, FNP

## 2023-08-31 ENCOUNTER — Other Ambulatory Visit (HOSPITAL_BASED_OUTPATIENT_CLINIC_OR_DEPARTMENT_OTHER): Payer: Self-pay

## 2023-08-31 ENCOUNTER — Telehealth: Payer: Self-pay

## 2023-08-31 ENCOUNTER — Other Ambulatory Visit (HOSPITAL_COMMUNITY): Payer: Self-pay

## 2023-08-31 NOTE — Telephone Encounter (Signed)
 Pharmacy Patient Advocate Encounter   Received notification from Onbase that prior authorization for Zoryve  0.3% cream  is required/requested.   Insurance verification completed.   The patient is insured through [employer] .   Per test claim: PA required; PA submitted to above mentioned insurance via Latent Key/confirmation #/EOC [facility] Status is pending

## 2023-08-31 NOTE — Telephone Encounter (Signed)
 Pharmacy Patient Advocate Encounter  Received notification from [employer] that Prior Authorization for Zoryve  0.3% cream   has been APPROVED from 08/31/2023 to 08/30/2024 PLEASE BE ADVISED  SPOKE TO PHARMACY AND GOT A PAID CLAIM PT COPAY IS $4.00  PA #/Case ID/Reference #: [id number]

## 2023-09-01 ENCOUNTER — Other Ambulatory Visit (HOSPITAL_COMMUNITY): Payer: Self-pay

## 2023-09-01 ENCOUNTER — Other Ambulatory Visit: Payer: Self-pay

## 2023-09-05 DIAGNOSIS — J449 Chronic obstructive pulmonary disease, unspecified: Secondary | ICD-10-CM | POA: Diagnosis not present

## 2023-09-06 DIAGNOSIS — F411 Generalized anxiety disorder: Secondary | ICD-10-CM | POA: Insufficient documentation

## 2023-09-12 ENCOUNTER — Encounter: Payer: Self-pay | Admitting: Family Medicine

## 2023-09-12 DIAGNOSIS — L400 Psoriasis vulgaris: Secondary | ICD-10-CM

## 2023-09-13 ENCOUNTER — Other Ambulatory Visit: Payer: Self-pay

## 2023-09-13 ENCOUNTER — Other Ambulatory Visit (HOSPITAL_BASED_OUTPATIENT_CLINIC_OR_DEPARTMENT_OTHER): Payer: Self-pay

## 2023-09-13 MED ORDER — IPRATROPIUM-ALBUTEROL 0.5-2.5 (3) MG/3ML IN SOLN
3.0000 mL | Freq: Four times a day (QID) | RESPIRATORY_TRACT | 3 refills | Status: AC | PRN
  Filled 2023-09-13 – 2023-09-14 (×2): qty 360, 30d supply, fill #0

## 2023-09-14 ENCOUNTER — Other Ambulatory Visit: Payer: Self-pay | Admitting: Critical Care Medicine

## 2023-09-14 ENCOUNTER — Other Ambulatory Visit (HOSPITAL_BASED_OUTPATIENT_CLINIC_OR_DEPARTMENT_OTHER): Payer: Self-pay

## 2023-09-14 ENCOUNTER — Other Ambulatory Visit: Payer: Self-pay

## 2023-09-15 ENCOUNTER — Other Ambulatory Visit (HOSPITAL_BASED_OUTPATIENT_CLINIC_OR_DEPARTMENT_OTHER): Payer: Self-pay

## 2023-09-15 MED ORDER — ZORYVE 0.3 % EX FOAM
1.0000 | Freq: Every day | CUTANEOUS | 3 refills | Status: AC
  Filled 2023-09-15: qty 60, 30d supply, fill #0
  Filled 2023-12-26: qty 60, 30d supply, fill #1
  Filled 2024-02-06: qty 60, 30d supply, fill #2

## 2023-09-15 MED ORDER — EPINEPHRINE 0.3 MG/0.3ML IJ SOAJ
0.3000 mg | INTRAMUSCULAR | 1 refills | Status: AC | PRN
  Filled 2023-09-15: qty 2, 28d supply, fill #0

## 2023-09-16 ENCOUNTER — Other Ambulatory Visit (HOSPITAL_BASED_OUTPATIENT_CLINIC_OR_DEPARTMENT_OTHER): Payer: Self-pay

## 2023-09-17 ENCOUNTER — Other Ambulatory Visit (HOSPITAL_BASED_OUTPATIENT_CLINIC_OR_DEPARTMENT_OTHER): Payer: Self-pay

## 2023-10-06 ENCOUNTER — Other Ambulatory Visit: Payer: Self-pay | Admitting: Family Medicine

## 2023-10-06 ENCOUNTER — Other Ambulatory Visit (HOSPITAL_BASED_OUTPATIENT_CLINIC_OR_DEPARTMENT_OTHER): Payer: Self-pay

## 2023-10-06 ENCOUNTER — Other Ambulatory Visit: Payer: Self-pay

## 2023-10-06 DIAGNOSIS — J431 Panlobular emphysema: Secondary | ICD-10-CM

## 2023-10-06 DIAGNOSIS — J9611 Chronic respiratory failure with hypoxia: Secondary | ICD-10-CM

## 2023-10-06 MED ORDER — TRELEGY ELLIPTA 200-62.5-25 MCG/ACT IN AEPB
1.0000 | INHALATION_SPRAY | Freq: Every day | RESPIRATORY_TRACT | 3 refills | Status: AC
  Filled 2023-10-06: qty 60, 30d supply, fill #0
  Filled 2023-11-14: qty 60, 30d supply, fill #1
  Filled 2023-12-23: qty 60, 30d supply, fill #2
  Filled 2024-02-06: qty 60, 30d supply, fill #3

## 2023-10-12 ENCOUNTER — Other Ambulatory Visit (HOSPITAL_BASED_OUTPATIENT_CLINIC_OR_DEPARTMENT_OTHER): Payer: Self-pay

## 2023-10-17 ENCOUNTER — Other Ambulatory Visit (HOSPITAL_BASED_OUTPATIENT_CLINIC_OR_DEPARTMENT_OTHER): Payer: Self-pay

## 2023-10-18 ENCOUNTER — Encounter: Payer: Self-pay | Admitting: Family Medicine

## 2023-10-21 ENCOUNTER — Telehealth: Payer: Self-pay

## 2023-10-21 ENCOUNTER — Other Ambulatory Visit (HOSPITAL_COMMUNITY): Payer: Self-pay

## 2023-10-21 NOTE — Telephone Encounter (Signed)
 Pharmacy Patient Advocate Encounter   Received notification from Patient Advice Request messages that prior authorization for Zoryve  3% foam is required/requested.   Insurance verification completed.   The patient is insured through [employer].   Per test claim: PA required; PA submitted to above mentioned insurance via Latent Key/confirmation #/EOC [facility] Status is pending

## 2023-10-21 NOTE — Telephone Encounter (Signed)
 Pharmacy Patient Advocate Encounter  Received notification from [employer] that Prior Authorization for Zoryve  0.3% foam has been APPROVED from 10/21/23 to 10/20/24   PA #/Case ID/Reference #: [id number]

## 2023-10-24 ENCOUNTER — Other Ambulatory Visit (HOSPITAL_COMMUNITY): Payer: Self-pay

## 2023-10-24 NOTE — Telephone Encounter (Signed)
 Patient notified

## 2023-10-27 ENCOUNTER — Other Ambulatory Visit (HOSPITAL_BASED_OUTPATIENT_CLINIC_OR_DEPARTMENT_OTHER): Payer: Self-pay

## 2023-10-27 ENCOUNTER — Other Ambulatory Visit: Payer: Self-pay

## 2023-10-28 ENCOUNTER — Other Ambulatory Visit: Payer: Self-pay

## 2023-10-31 NOTE — Progress Notes (Unsigned)
 Cardiology Office Note:    Date:  11/01/2023   ID:  Richard Fuentes, DOB Feb 23, 1970, MRN [id number]  PCP:  Alvia Corean CROME, FNP  Cardiologist:  Alm Clay, MD     Referring MD: Alvia Corean CROME, *   Chief Complaint: concerned that he is passing out  History of Present Illness:    Richard Fuentes is a 53 y.o. 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 Clay and presents today for further evaluation of questions syncope.  Patient was referred to Dr. Clay in 08/2019 for pre-transplant evaluation. He has end stage COPD and had recently been referred to the [facility] where there were plans for a bronchoscopy with potential Zephyr valve placement for non-surgical decompression of upper lobes. As part of pre-op evaluation, he was referred for Cardiology evaluation. At that visit, he reported significant dyspnea and orthopnea as well as chest tightness during attacks of dyspnea and wheezing. Chest tightness was felt to likely be due to COPD. Coronary CTA was ordered for further evaluation but was unable to be performed due to a significant drop in his BP after dose of Lopressor  leading to the procedure being cancelled. However, coronary calcium  score came back low at  4 placing patient in the 63rd percentile for age and sex. Echo in 02/2020 at [facility] showed normal LV and RV function with no significant valvular disease.   He underwent bronchoscopy and placement of endobronchial valve in the left upper lobe at [facility] in 03/2020. Procedure was complicated by pneumothorax requiring chest placement of chest tube.  This valve failed and he underwent revision with replacement of the apico-posterior segment valves in the left  upper lobe in 01/2021 . He is not felt to be a candidate for repeat endobronchial valve and has been turned down for transplant due to severe OCD and absence of 2 caregivers. He was offered a lung volume reduction surgery on the right but patient has been hesitant to proceed with this.   He was last seen by Barnie Hila, NP, in 02/2022 at which time he reported left sided stabbing chest pain with exertion after getting acutely short of breath. Chest pain was felt to be atypical and likely due to COPD but repeat Echo was ordered for further evaluation. BP was significant different in each arm. Therefore, carotid dopplers were also ordered. Echo showed LVEF of 70-75% with normal wall motion and diastolic parameters, normal RV function, and no significant valvular disease. Carotid dopplers showed no evidence of carotid artery disease or subclavian artery disease.   Patient presents today for follow-up. He has significant dyspnea with minimal activity such as getting dressed due to his underlying severe pulmonary disease as described above.  Symptoms wax and wane like a roller coaster but he states every day is a struggle.  He uses O2 as needed during exacerbations.  Pulmonology recently referred him to Palliative Care and they have recommended hospice but he does not sound like he is ready to do this yet.  He scheduled today's appointment because he was concerned about possible passing out episodes.  However, what he really describes is just falling asleep easily.  He states he does not sleep a lot due to his severe anxiety and he is very tired all the time  and will easily fall asleep when sitting down essentially because he just cannot keep his eyes open.  He does not describe any real syncope.  He has some lightheadedness/dizziness during severe episodes of shortness of breath and occasionally when standing too quickly as well.  However, this is not new.  He also describes atypical chest pain that he  describes as a pressure/tightness that only occurs when he has bad COPD exacerbations and air gets trapped.  This will occur 1-3 times per day. He will have some residual blunt pain for a while after these episodes.  This is the same pain that he has had for several years now and it sounds secondary to his severe lung disease.  It does not sound cardiac in nature.  He states he has a lot of paranoia and anxiety and states OCD has ruled his life  since he was a child.  Unfortunately, he does not have any social support.  He has one aunt but she is elderly and cannot help much. His beloved cat, who he took everywhere, died about 3 months ago and this has been very hard on him.    Overall, it really does not sound like he is having any acute cardiac issues.  He acknowledges that his main issues are his pulmonary condition and his mental health.  EKGs/Labs/Other Studies Reviewed:    The following studies were reviewed:  CT Cardiac Scoring 09/03/2019: Impression: Coronary calcium  score of 4. This was 77 percentile for age and sex matched control. Calcium  in proximal LAD. _______________  Carotid Dopplers 02/23/2022: Summary:  - Right Carotid: There is no evidence of stenosis in the right ICA. The  extracranial vessels were near-normal with only minimal  wall thickening or plaque.  - Left Carotid: There is no evidence of stenosis in the left ICA. The extracranial vessels were near-normal with only minimal wall thickening or plaque.  - Vertebrals:  Bilateral vertebral arteries demonstrate antegrade flow.  - Subclavians: Normal flow hemodynamics were seen in bilateral subclavian arteries.  _______________  Echocardiogram 03/16/2023: Impressions: 1. Left ventricular ejection fraction, by estimation, is 70 to 75%. The  left ventricle has hyperdynamic function. The left ventricle has no  regional wall motion abnormalities. Left ventricular diastolic parameters  were normal. The average left   ventricular global longitudinal strain is 20.8 %. The global longitudinal  strain is normal.   2. Right ventricular systolic function is normal. The right ventricular  size is normal.   3. The mitral valve is normal in structure. Trivial mitral valve  regurgitation. No evidence of mitral stenosis.   4. The aortic valve is tricuspid. Aortic valve regurgitation is not  visualized. No aortic stenosis is present.   5. The inferior vena cava is normal in size with greater than 50%  respiratory variability, suggesting right atrial pressure of 3 mmHg.   EKG:  EKG ordered today.   EKG Interpretation Date/Time:  Tuesday November 01 2023 14:38:57 EDT Ventricular Rate:  71 PR Interval:  156 QRS Duration:  76 QT Interval:  386 QTC Calculation: 419 R Axis:   83  Text Interpretation: Normal sinus rhythm Normal ECG When compared with ECG of 09-Feb-2022 17:37, No significant change was found Confirmed by Jadine Patient [phone]) on 11/01/2023 2:45:32 PM    Recent Labs: 03/15/2023: ALT 15; BUN 15; Creatinine, Ser 1.12; Hemoglobin 14.7; Platelets 270.0; Potassium 4.1; Sodium 139  Recent Lipid Panel    Component Value Date/Time   CHOL 197 03/15/2023 1118   CHOL  248 (H) 03/23/2021 1135   TRIG 103.0 03/15/2023 1118   HDL 47.00 03/15/2023 1118   HDL 44 03/23/2021 1135   CHOLHDL 4 03/15/2023 1118   VLDL 20.6 03/15/2023 1118   LDLCALC 130 (H) 03/15/2023 1118   LDLCALC 179 (H) 03/23/2021 1135    Physical Exam:    Vital Signs: BP 110/68 (BP Location: Left Arm, Patient Position: Sitting, Cuff Size: Normal)   Pulse 71   Resp 16   Ht 6' 1 (1.854 m)   Wt 179 lb 12.8 oz (81.6 kg)   SpO2 98%   BMI 23.72 kg/m     Wt Readings from Last 3 Encounters:  11/01/23 179 lb 12.8 oz (81.6 kg)  08/30/23 180 lb (81.6 kg)  08/25/23 175 lb 12.8 oz (79.7 kg)     General: 53 y.o. Caucasian male in no acute distress. HEENT: Normocephalic and atraumatic. Sclera clear.  Neck: Supple. No JVD. Heart: RRR. No  murmurs, gallops, or rubs.  Lungs: No increased work of breathing.  Decreased breath sounds with minimal air movement in bilateral bases. No significant wheezes, rhonchi, or rales appreciated. Extremities: No lower extremity edema.   Skin: Warm and dry. Neuro: No focal deficits. Psych: Normal affect. Responds appropriately.  Assessment:    1. Other fatigue   2. Atypical chest pain   3. Coronary artery calcification   4. Hyperlipidemia with target LDL less than 100   5. COPD, severe (HCC)   6. Financial difficulties     Plan:    Fatigue Patient scheduled today's visit because he was concerned about possible passing out episodes.  However, when what he really describes as severe fatigue and falling asleep easily.  He is not really having syncope. - Suspect fatigue is due to the fact that he does not sleep much at all due to severe anxiety as well as untreated sleep apnea. No additional Cardiology work-up necessary. Recommended following up with Pulmonology and PCP.   Atypical Chest Pain Coronary Artery Calcifications Patient has a history of atypical chest pain. Coronary calcium  score was 4 (63rd percentile for age and sex) in 08/2019. Symptoms felt to be due to severe COPD.  - He continues to have atypical pain that is unchanged and likely due to severe pulmonary disease.  - EKG today shows no acute ischemic changes.  - No additional ischemic work-up necessary.   Hyperlipidemia Lipid panel in 03/2023: Total Cholesterol 197, Triglycerides 103, HDL 47, LDL 130.  - Statin was previously recommended but patient declined. He states his severe OCD prevents him from taking medications. Do not think there is any benefit in adding a statin anyway given Palliative Care has recommended hospice.   Severe COPD with Severe Emphysema S/p bronchoscopy and placement of endobronchial valve in the left upper lobe at [facility] in 03/2020 and then revision with replacement of the apico-posterior segment valves  in the left upper lobe in 01/2021. He is not felt to be a candidate for repeat endobronchial valve and has been turned down for transplant due to severe OCD and absence of 2 caregivers. He was recently referred to Palliative Care who recommended hospice although he does not sound like he is ready for this yet.  - He continue to have severe dyspnea with minimal activity.  - Management per Pulmonology.  He was recently referred to Palliative Care who recommended hospice although he does not sound like he is ready for this yet.   Of note, patient really has no social support. He is  also very concerned about losing Medicaid. Will ask our social worker to reach out to patient to see if there is anything we can help with.  Disposition: He can just follow-up with us  as needed given severe lung disease and recommendation for hospice.    Signed, Aline FORBES Door, PA-C  11/01/2023 8:28 PM    [facility]

## 2023-11-01 ENCOUNTER — Ambulatory Visit: Attending: Student | Admitting: Student

## 2023-11-01 ENCOUNTER — Encounter: Payer: Self-pay | Admitting: Student

## 2023-11-01 VITALS — BP 110/68 | HR 71 | Resp 16 | Ht 73.0 in | Wt 179.8 lb

## 2023-11-01 DIAGNOSIS — E785 Hyperlipidemia, unspecified: Secondary | ICD-10-CM | POA: Insufficient documentation

## 2023-11-01 DIAGNOSIS — J449 Chronic obstructive pulmonary disease, unspecified: Secondary | ICD-10-CM | POA: Diagnosis not present

## 2023-11-01 DIAGNOSIS — I2510 Atherosclerotic heart disease of native coronary artery without angina pectoris: Secondary | ICD-10-CM | POA: Insufficient documentation

## 2023-11-01 DIAGNOSIS — Z599 Problem related to housing and economic circumstances, unspecified: Secondary | ICD-10-CM | POA: Insufficient documentation

## 2023-11-01 DIAGNOSIS — R5383 Other fatigue: Secondary | ICD-10-CM | POA: Diagnosis not present

## 2023-11-01 DIAGNOSIS — R0789 Other chest pain: Secondary | ICD-10-CM | POA: Diagnosis not present

## 2023-11-01 NOTE — Patient Instructions (Signed)
 Thank you for choosing [facility]!     Medication Instructions:  No medication changes were made during today's visit.   *If you need a refill on your cardiac medications before your next appointment, please call your pharmacy*   Lab Work: No labs were ordered during today's visit.  If you have labs (blood work) drawn today and your tests are completely normal, you will receive your results only by: MyChart Message (if you have MyChart) OR A paper copy in the mail If you have any lab test that is abnormal or we need to change your treatment, we will call you to review the results.   Testing/Procedures: No procedures were ordered during today's visit.   Your next appointment:  as needed   Provider:   Alm Clay, MD     Follow-Up: At [facility], you and your health needs are our priority.  As part of our continuing mission to provide you with exceptional heart care, we have created designated Provider Care Teams.  These Care Teams include your primary Cardiologist (physician) and Advanced Practice Providers (APPs -  Physician Assistants and Nurse Practitioners) who all work together to provide you with the care you need, when you need it. We recommend signing up for the patient portal called MyChart.  Sign up information is provided on this After Visit Summary.  MyChart is used to connect with patients for Virtual Visits (Telemedicine).  Patients are able to view lab/test results, encounter notes, upcoming appointments, etc.  Non-urgent messages can be sent to your provider as well.   To learn more about what you can do with MyChart, go to [email or web address].   .no

## 2023-11-03 ENCOUNTER — Telehealth (HOSPITAL_BASED_OUTPATIENT_CLINIC_OR_DEPARTMENT_OTHER): Payer: Self-pay | Admitting: Licensed Clinical Social Worker

## 2023-11-03 NOTE — Progress Notes (Signed)
 Heart and Vascular Care Navigation  11/02/2023- LATE ENTRY from 10/29  Richard Fuentes 16-May-1970 [id number]  Reason for Referral: concerns regarding Medicaid Patient is participating in a Managed Medicaid Plan:Yes  Engaged with patient by telephone for initial visit for Heart and Vascular Care Coordination.                                                                                                   Assessment:                        LCSW was able to reach pt at [phone], introduced self, role, reason for call. Confirmed full name, DOB and home address. Pt resides alone, receives SSI income, works intermittently for cash (unclear what currently work wise he is doing but receives very banker). He has COPD and often loses his breath during our conversation, but shares he is okay with completing assessment.  He is not felt to be a candidate for repeat endobronchial valve and has been turned down for transplant due to severe OCD and absence of 2 caregivers. He was offered a lung volume reduction surgery on the right but the patient has been hesitant to proceed with this.  He was referred to me regarding Medicaid questions.  He shares he received a letter from social security he needed to complete for his SSI and by the time he received a second notice it was late and he is concerned it may cause a lapse in his income and effect his Medicaid. Pt confirms he did return letter to [employer] office, shared unfortunately I dont have any way to see if this will effect any income but encouraged him to reach out to [facility] to check on any updates. If he does have a change in income he should report that to [facility] office, but he will need to work with their teams regarding eligibility. I discussed with pt also that he should monitor his mail for any letters needing to be completed regarding disability by providers (PCP or pulmonology) with Medicaid updates to eligibility.   While speaking  with this pt he shared he had been referred to [facility] by pulmonology but felt the conversation seemed like a sales pitch and was really turned off by that. I attempted to discuss why palliative/hospice can be helpful and encouraged him to reach back out to [facility] but he remains hesitant. Also concerned he'd have to stop seeing his PCP/[facility] providers if transitions to hospice and doesn't feel comfortable doing this bc I have 27% lung function still. He also felt like the pulmonologist was not sure about some of  his questions. I shared I could reach out to [facility] team to see if they ever are able to do outpatient consults or to perhaps connect pt to someone who may be able to answer additional concerns.   No additional questions today, thanked pt for his transparency and questions. Will f/u with any additional resources to help support pt at this time.   HRT/VAS Care Coordination     Patients  Home Cardiology Office [facility]   Outpatient Care Team Social Worker   Social Worker Name: Marit Lark, [place], [phone]   Living arrangements for the past 2 months Single Family Home  townhome   Lives with: Self   Patient Current Insurance Coverage Medicaid   Patient Has Concern With Paying Medical Bills No  concerned about what to do if lapse in social security affecting his Medicaid   Does Patient Have Prescription Coverage? Yes   Home Assistive Devices/Equipment Oxygen ; Eyeglasses; Nebulizer       Social History:                                                                             SDOH Screenings   Food Insecurity: Food Insecurity Present (11/03/2023)  Housing: Low Risk  (11/03/2023)  Transportation Needs: No Transportation Needs (11/03/2023)  Utilities: Not At Risk (11/03/2023)  Alcohol Screen: Low Risk  (08/29/2023)  Depression (PHQ2-9): High Risk (09/15/2022)  Financial Resource Strain: Medium Risk (11/03/2023)  Physical Activity: Insufficiently  Active (08/29/2023)  Social Connections: Unknown (08/29/2023)  Stress: Stress Concern Present (11/03/2023)  Tobacco Use: Medium Risk (11/01/2023)    SDOH Interventions: Financial Resources:  Surveyor, Quantity Strain Interventions: Artist (discussed how to contact Medicaid caseworkers with DSS if change in income, will need to monitor mail for any paperwork to complete with Medicaid work requirements, will mail food resources and rent/utility resources) DSS for financial assistance and Tree Surgeon for Medical Illustrator Insecurity:  Food Insecurity Interventions: [employer] Provided (mailed food resources)  Housing Insecurity:  Housing Interventions: Intervention Not Indicated  Transportation:   Transportation Interventions: Intervention Not Indicated, Payor Benefit   Other Care Navigation Interventions:     Provided Pharmacy assistance resources  Currently no issues obtaining or affording medications  Patient expressed Mental Health concerns Yes, Referred to:  palliative care, pt currently takes meds for mental health hx, provided verbal support. No SI or HI verbalized   Follow-up plan:   LCSW has called our [facility] Palliative care team and sent secure message to [facility], NP with Palliative team to see if she has any connections at [facility] that could speak with pt about recommendations for palliative/hospice and any questions he may have. I have sent information about Medicaid, food and utility resources as well as my card. Will f/u with pt to see if any additional questions/concerns.

## 2023-11-14 ENCOUNTER — Other Ambulatory Visit (HOSPITAL_BASED_OUTPATIENT_CLINIC_OR_DEPARTMENT_OTHER): Payer: Self-pay

## 2023-11-14 ENCOUNTER — Telehealth: Payer: Self-pay | Admitting: Licensed Clinical Social Worker

## 2023-11-14 ENCOUNTER — Other Ambulatory Visit: Payer: Self-pay | Admitting: Family Medicine

## 2023-11-14 ENCOUNTER — Other Ambulatory Visit: Payer: Self-pay

## 2023-11-14 DIAGNOSIS — J431 Panlobular emphysema: Secondary | ICD-10-CM

## 2023-11-14 DIAGNOSIS — J9611 Chronic respiratory failure with hypoxia: Secondary | ICD-10-CM

## 2023-11-14 MED ORDER — ALBUTEROL SULFATE HFA 108 (90 BASE) MCG/ACT IN AERS
2.0000 | INHALATION_SPRAY | RESPIRATORY_TRACT | 2 refills | Status: AC | PRN
Start: 2023-11-14 — End: ?
  Filled 2023-11-14: qty 18, 25d supply, fill #0
  Filled 2023-12-23: qty 18, 25d supply, fill #1

## 2023-11-14 NOTE — Telephone Encounter (Signed)
 H&V Care Navigation CSW Progress Note  Clinical Social Worker contacted patient by phone to f/u on update receieved from [facility]. LCSW was unable to reach pt at first since voicemail is not set up but received a call back from pt at [phone]. I shared that our palliative team does not take outpatient referrals but they will reach out to [facility] colleagues and see if there may be someone that can provide him a space to ask questions with their team. I also shared that they recommended perhaps a referral to [facility], to see if perhaps they may have a better rapport/answer some of pt questions. Pt agreeable to this. I have reached out to PCP to see if they would sign orders and I will happily reach out to the team at [facility].  Patient is participating in a Managed Medicaid Plan:  No, self pay only   SDOH Screenings   Food Insecurity: Food Insecurity Present (11/03/2023)  Housing: Low Risk  (11/03/2023)  Transportation Needs: No Transportation Needs (11/03/2023)  Utilities: Not At Risk (11/03/2023)  Alcohol Screen: Low Risk  (08/29/2023)  Depression (PHQ2-9): High Risk (09/15/2022)  Financial Resource Strain: Medium Risk (11/03/2023)  Physical Activity: Insufficiently Active (08/29/2023)  Social Connections: Unknown (08/29/2023)  Stress: Stress Concern Present (11/03/2023)  Tobacco Use: Medium Risk (11/01/2023)    Marit Lark, MSW, LCSW Clinical Social Worker II [facility] Heart/Vascular Care Navigation  [phone]- work cell phone (preferred)

## 2023-11-16 ENCOUNTER — Telehealth: Payer: Self-pay | Admitting: Licensed Clinical Social Worker

## 2023-11-16 NOTE — Telephone Encounter (Signed)
 H&V Care Navigation CSW Progress Note  Clinical Child Psychotherapist completed referral to [employer] [facility], [facility]. Had received okay from PCP that orders could be sent from their office if needed as pt does not have f/u with our providers.  Patient is participating in a Managed Medicaid Plan:  Yes  SDOH Screenings   Food Insecurity: Food Insecurity Present (11/03/2023)  Housing: Low Risk  (11/03/2023)  Transportation Needs: No Transportation Needs (11/03/2023)  Utilities: Not At Risk (11/03/2023)  Alcohol Screen: Low Risk  (08/29/2023)  Depression (PHQ2-9): High Risk (09/15/2022)  Financial Resource Strain: Medium Risk (11/03/2023)  Physical Activity: Insufficiently Active (08/29/2023)  Social Connections: Unknown (08/29/2023)  Stress: Stress Concern Present (11/03/2023)  Tobacco Use: Medium Risk (11/01/2023)    Richard Fuentes, MSW, LCSW Clinical Social Worker II [facility] Heart/Vascular Care Navigation  [phone]- work cell phone (preferred)

## 2023-11-17 ENCOUNTER — Encounter (HOSPITAL_BASED_OUTPATIENT_CLINIC_OR_DEPARTMENT_OTHER): Payer: Self-pay | Admitting: Pulmonary Disease

## 2023-11-17 ENCOUNTER — Ambulatory Visit (HOSPITAL_BASED_OUTPATIENT_CLINIC_OR_DEPARTMENT_OTHER): Admitting: Pulmonary Disease

## 2023-11-17 ENCOUNTER — Other Ambulatory Visit (HOSPITAL_BASED_OUTPATIENT_CLINIC_OR_DEPARTMENT_OTHER): Payer: Self-pay

## 2023-11-17 VITALS — BP 104/67 | HR 90 | Ht 73.0 in | Wt 178.7 lb

## 2023-11-17 DIAGNOSIS — F419 Anxiety disorder, unspecified: Secondary | ICD-10-CM

## 2023-11-17 DIAGNOSIS — J4489 Other specified chronic obstructive pulmonary disease: Secondary | ICD-10-CM

## 2023-11-17 DIAGNOSIS — G4700 Insomnia, unspecified: Secondary | ICD-10-CM | POA: Diagnosis not present

## 2023-11-17 MED ORDER — ALPRAZOLAM 0.5 MG PO TABS: 0.5000 mg | ORAL_TABLET | Freq: Every day | ORAL | 0 refills | Status: DC | PRN

## 2023-11-17 MED ORDER — PREDNISONE 10 MG PO TABS
10.0000 mg | ORAL_TABLET | Freq: Every day | ORAL | 2 refills | Status: DC
  Filled 2023-11-17: qty 30, 30d supply, fill #0
  Filled 2023-12-23: qty 30, 30d supply, fill #1

## 2023-11-17 NOTE — Progress Notes (Signed)
 Subjective:    Patient ID: Richard Fuentes, male    DOB: [birth year], 53 y.o.   MRN: [id number]    53 yo heavy ex-smoker  for FU of severe emphysema Former patient of Dr. Brien He followed  at [facility] for many  years He reports high symptom burden with dyspnea on exertion on minimal activity and chronic cough He smoked 2 packs/day until he quit in 2020, 70 pack yrs   03/2020 underwent bronchoscopic lung volume reduction with Zephyr endobronchial valve placement x 4 to the left upper lobe,complicated by left-sided pneumothorax    01/15/21  replacement of the apico-posterior segment valves in the LUL.    He was evaluated by [facility] and thoracic surgery.  Felt not to be candidate for repeat endobronchial valves and felt to be a candidate for LV RS but he is hesitant to proceed.  He is contemplating other elective surgeries finished such as teeth extraction and a hernia repair.  Plan is for endobronchial valve removal after he completes the surgeries.  He underwent transplant evaluation  turned down due to his severe OCD and absence of 2 caregivers.  At any rate he does not want to pursue transplant   He completed pulmonary rehab in the past and now does his own exercise program    Meds  - ohtuwayrre caused diarrhea   Significant tests/ events reviewed   CT chest 10/2022 right lower lobe nodule resolved, severe apical emphysema left upper lobe bronchial valve present, migrated 07/2019 alpha-1 antitrypsin negative   02/2022 PFTs FEV1 1.25/30%, DLCO 9.87/31%  Discussed the use of AI scribe software for clinical note transcription with the patient, who gave verbal consent to proceed.  History of Present Illness Richard Fuentes is a 53 year old male with severe refractory COPD who presents for follow-up.  He experiences worsening COPD symptoms, including increased shortness of breath and frequent dyspnea, especially at night. These episodes are panic-inducing and occur daily, leading to exhaustion  and debilitation.  He has undergone endobronchial valve placement, which resulted in a prolonged recovery. He is considering lung volume reduction surgery but fears complications like air leaks and is concerned about the impact on his current functioning, as he can still walk with difficulty.  He finds prednisone  effective and desires it as a regular medication. He discusses morphine and Ativan for symptom management but is reluctant to use them due to an aversion to medication.  Anxiety and OCD contribute to his symptoms. He is resistant to mental health treatment due to past negative experiences and is cautious about medications like Prozac and Xanax.  He experiences significant sleep disturbances, often sleeping only 30 minutes to an hour and a half at a time. He has difficulty breathing at night, possibly due to COPD and sleep apnea, and has not found a suitable BiPAP machine.       Review of Systems  neg for any significant sore throat, dysphagia, itching, sneezing, nasal congestion or excess/ purulent secretions, fever, chills, sweats, unintended wt loss, pleuritic or exertional cp, hempoptysis, orthopnea pnd or change in chronic leg swelling. Also denies presyncope, palpitations, heartburn, abdominal pain, nausea, vomiting, diarrhea or change in bowel or urinary habits, dysuria,hematuria, rash, arthralgias, visual complaints, headache, numbness weakness or ataxia.      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 Previous endobronchial valve placement with complications. Considering lung volume reduction surgery (LVRS) but apprehensive due to potential complications and recovery concerns. Prednisone  has been effective in managing symptoms. Discussed  the possibility of daily low-dose prednisone  to maintain baseline control. He is concerned about the progression of COPD and the impact on quality of life. He is considering LVRS despite apprehension due to the severity of symptoms and the potential for improved quality of life. - Started prednisone  10 mg daily - plant o continue x 1 month then decrease to 5 mg - Ordered CT scan and pulmonary function test (PFT) to assess current status. - Discussed potential for LVRS and associated risks and benefits. - Provided alprazolam (Xanax) for anxiety management as needed.  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 He is resistant to mental health interventions and medications but is open to having alprazolam available for anxiety management. Discussed the impact of anxiety on COPD symptoms and the potential benefits of managing anxiety to improve overall quality of life. He expressed a desire to have medication available for anxiety management, even if not regularly used. - Prescribed alprazolam (Xanax) 5 tablets for anxiety management as needed. - Encouraged follow-up with mental health services for anxiety and insomnia management.

## 2023-11-17 NOTE — Patient Instructions (Addendum)
 X spirometry pre-post  X cT CHEST   X Daily dose of pred 10 mg

## 2023-11-18 ENCOUNTER — Other Ambulatory Visit (HOSPITAL_BASED_OUTPATIENT_CLINIC_OR_DEPARTMENT_OTHER): Payer: Self-pay

## 2023-11-19 ENCOUNTER — Other Ambulatory Visit (HOSPITAL_BASED_OUTPATIENT_CLINIC_OR_DEPARTMENT_OTHER): Payer: Self-pay

## 2023-11-24 ENCOUNTER — Ambulatory Visit (HOSPITAL_BASED_OUTPATIENT_CLINIC_OR_DEPARTMENT_OTHER): Attending: Pulmonary Disease

## 2023-11-30 ENCOUNTER — Telehealth (HOSPITAL_BASED_OUTPATIENT_CLINIC_OR_DEPARTMENT_OTHER): Payer: Self-pay

## 2023-11-30 ENCOUNTER — Other Ambulatory Visit (HOSPITAL_BASED_OUTPATIENT_CLINIC_OR_DEPARTMENT_OTHER): Payer: Self-pay

## 2023-11-30 MED ORDER — ALPRAZOLAM 0.5 MG PO TABS
0.5000 mg | ORAL_TABLET | Freq: Every day | ORAL | 0 refills | Status: AC | PRN
  Filled 2023-11-30: qty 5, 5d supply, fill #0

## 2023-11-30 NOTE — Telephone Encounter (Signed)
 Copied from CRM [id number]. Topic: Clinical - Prescription Issue >> Nov 30, 2023  2:40 PM Rozanna MATSU wrote: Reason for CRM: pt called stated the pharmacy stated they see the prescription but its not there ALPRAZolam  (XANAX ) 0.5 MG tablet, It should have gone to the [facility]

## 2023-11-30 NOTE — Telephone Encounter (Signed)
 Signed xanax  0.5 mg tablet prescription on Dr. Cyndi behalf. Any future refills or requests should be directed to his primary care.

## 2023-12-03 ENCOUNTER — Ambulatory Visit (HOSPITAL_BASED_OUTPATIENT_CLINIC_OR_DEPARTMENT_OTHER)
Admission: RE | Admit: 2023-12-03 | Discharge: 2023-12-03 | Disposition: A | Discharge status: Home / Self Care | Source: Ambulatory Visit | Attending: Pulmonary Disease | Admitting: Pulmonary Disease

## 2023-12-03 DIAGNOSIS — I700 Atherosclerosis of aorta: Secondary | ICD-10-CM | POA: Diagnosis not present

## 2023-12-03 DIAGNOSIS — J439 Emphysema, unspecified: Secondary | ICD-10-CM | POA: Insufficient documentation

## 2023-12-03 DIAGNOSIS — R911 Solitary pulmonary nodule: Secondary | ICD-10-CM | POA: Diagnosis not present

## 2023-12-03 DIAGNOSIS — J4489 Other specified chronic obstructive pulmonary disease: Secondary | ICD-10-CM | POA: Diagnosis not present

## 2023-12-05 ENCOUNTER — Encounter (HOSPITAL_BASED_OUTPATIENT_CLINIC_OR_DEPARTMENT_OTHER): Payer: Self-pay | Admitting: Pulmonary Disease

## 2023-12-05 NOTE — Telephone Encounter (Signed)
 FYI

## 2023-12-06 NOTE — Telephone Encounter (Signed)
 Received fax that [facility] has attempted to reach patient unsuccessfully.   From chart review, Richard Fuentes  caused diarrhea.   NFN

## 2023-12-09 ENCOUNTER — Telehealth: Payer: Self-pay | Admitting: Licensed Clinical Social Worker

## 2023-12-09 NOTE — Telephone Encounter (Signed)
 H&V Care Navigation CSW Progress Note  Clinical Social Worker confirmed per [employer], RN with [facility] that referral has been successfully connected to outpatient palliative care services. Remain available as needed moving forward for any additional questions/concerns.  Patient is participating in a Managed Medicaid Plan:  Yes healthy blue  SDOH Screenings   Food Insecurity: Food Insecurity Present (11/03/2023)  Housing: Low Risk  (11/03/2023)  Transportation Needs: No Transportation Needs (11/03/2023)  Utilities: Not At Risk (11/03/2023)  Alcohol Screen: Low Risk  (08/29/2023)  Depression (PHQ2-9): High Risk (09/15/2022)  Financial Resource Strain: Medium Risk (11/03/2023)  Physical Activity: Insufficiently Active (08/29/2023)  Social Connections: Unknown (08/29/2023)  Stress: Stress Concern Present (11/03/2023)  Tobacco Use: Medium Risk (11/17/2023)    Richard Fuentes, MSW, LCSW Clinical Social Worker [facility]Vascular Care Navigation  [phone]- work cell phone (preferred)

## 2023-12-12 ENCOUNTER — Ambulatory Visit: Payer: Self-pay | Admitting: Pulmonary Disease

## 2023-12-13 NOTE — Telephone Encounter (Signed)
Pt has questions

## 2023-12-13 NOTE — Telephone Encounter (Signed)
 Please advise

## 2023-12-22 ENCOUNTER — Ambulatory Visit (HOSPITAL_BASED_OUTPATIENT_CLINIC_OR_DEPARTMENT_OTHER): Admitting: Pulmonary Disease

## 2023-12-23 ENCOUNTER — Other Ambulatory Visit (HOSPITAL_BASED_OUTPATIENT_CLINIC_OR_DEPARTMENT_OTHER): Payer: Self-pay | Admitting: Pulmonary Disease

## 2023-12-23 ENCOUNTER — Ambulatory Visit (INDEPENDENT_AMBULATORY_CARE_PROVIDER_SITE_OTHER)

## 2023-12-23 ENCOUNTER — Other Ambulatory Visit (HOSPITAL_BASED_OUTPATIENT_CLINIC_OR_DEPARTMENT_OTHER): Payer: Self-pay

## 2023-12-23 DIAGNOSIS — R0602 Shortness of breath: Secondary | ICD-10-CM

## 2023-12-23 LAB — PULMONARY FUNCTION TEST
FEF 25-75 Pre: 0.5 L/s
FEF2575-%Pred-Pre: 14 %
FEV1-%Pred-Pre: 25 %
FEV1-Pre: 1.05 L
FEV1FVC-%Pred-Pre: 52 %
FEV6-%Pred-Pre: 48 %
FEV6-Pre: 2.52 L
FEV6FVC-%Pred-Pre: 99 %
FVC-%Pred-Pre: 48 %
FVC-Pre: 2.63 L
Pre FEV1/FVC ratio: 40 %
Pre FEV6/FVC Ratio: 96 %

## 2023-12-23 NOTE — Progress Notes (Cosign Needed)
Spirometry only performed today.

## 2023-12-23 NOTE — Telephone Encounter (Signed)
 Any future refills or requests should be directed to his primary care.   Per Chart note from Dr Jude

## 2023-12-23 NOTE — Patient Instructions (Signed)
Spirometry only performed today.

## 2023-12-26 ENCOUNTER — Other Ambulatory Visit: Payer: Self-pay

## 2023-12-27 ENCOUNTER — Other Ambulatory Visit (HOSPITAL_BASED_OUTPATIENT_CLINIC_OR_DEPARTMENT_OTHER): Payer: Self-pay

## 2023-12-27 ENCOUNTER — Other Ambulatory Visit: Payer: Self-pay

## 2023-12-28 ENCOUNTER — Other Ambulatory Visit (HOSPITAL_BASED_OUTPATIENT_CLINIC_OR_DEPARTMENT_OTHER): Payer: Self-pay

## 2024-01-02 ENCOUNTER — Ambulatory Visit: Payer: Self-pay | Admitting: Pulmonary Disease

## 2024-01-05 DIAGNOSIS — J449 Chronic obstructive pulmonary disease, unspecified: Secondary | ICD-10-CM | POA: Diagnosis not present

## 2024-01-07 ENCOUNTER — Other Ambulatory Visit (HOSPITAL_BASED_OUTPATIENT_CLINIC_OR_DEPARTMENT_OTHER): Payer: Self-pay

## 2024-01-12 ENCOUNTER — Other Ambulatory Visit (HOSPITAL_BASED_OUTPATIENT_CLINIC_OR_DEPARTMENT_OTHER): Payer: Self-pay

## 2024-01-15 NOTE — Progress Notes (Unsigned)
" ° °  Acute Office Visit  Subjective:     Patient ID: Richard Fuentes, male    DOB: [birth year], 54 y.o.   MRN: [id number]  No chief complaint on file.   HPI  Discussed the use of AI scribe software for clinical note transcription with the patient, who gave verbal consent to proceed.  History of Present Illness      ROS Per HPI      Objective:    There were no vitals taken for this visit.   Physical Exam Vitals and nursing note reviewed.  Constitutional:      General: He is not in acute distress.    Appearance: Normal appearance.  HENT:     Head: Normocephalic and atraumatic.     Right Ear: External ear normal.     Left Ear: External ear normal.     Nose: Nose normal.     Mouth/Throat:     Mouth: Mucous membranes are moist.     Pharynx: Oropharynx is clear.  Eyes:     Extraocular Movements: Extraocular movements intact.  Cardiovascular:     Rate and Rhythm: Normal rate and regular rhythm.     Pulses: Normal pulses.     Heart sounds: Normal heart sounds.  Pulmonary:     Effort: Pulmonary effort is normal. No respiratory distress.     Breath sounds: Normal breath sounds. No wheezing, rhonchi or rales.  Musculoskeletal:        General: Normal range of motion.     Cervical back: Normal range of motion.     Right lower leg: No edema.     Left lower leg: No edema.  Lymphadenopathy:     Cervical: No cervical adenopathy.  Skin:    General: Skin is warm and dry.  Neurological:     General: No focal deficit present.     Mental Status: He is alert and oriented to person, place, and time.  Psychiatric:        Mood and Affect: Mood normal.        Behavior: Behavior normal.     No results found for any visits on 01/16/24.      Assessment & Plan:   Assessment and Plan Assessment & Plan      No orders of the defined types were placed in this encounter.    No orders of the defined types were placed in this encounter.   No follow-ups on  file.  Corean LITTIE Ku, FNP  "

## 2024-01-16 ENCOUNTER — Ambulatory Visit: Admitting: Family Medicine

## 2024-01-16 ENCOUNTER — Ambulatory Visit: Payer: Self-pay

## 2024-01-16 ENCOUNTER — Telehealth: Payer: Self-pay

## 2024-01-16 DIAGNOSIS — G43109 Migraine with aura, not intractable, without status migrainosus: Secondary | ICD-10-CM

## 2024-01-16 NOTE — Telephone Encounter (Signed)
 Patient triage completed earlier today. Patient calling back to reschedule appt to later time. Rescheduled to 01/17/24 at 1:40PM.  Copied from CRM #[id number]. Topic: Clinical - Red Word Triage >> Jan 16, 2024 12:45 PM Lauren C wrote: Pt calling back to r/s previously triaged appt.

## 2024-01-16 NOTE — Telephone Encounter (Signed)
 Attempted to reach patient today in regards to missed visit, just to check in on him. No answer, unable to LVM.

## 2024-01-16 NOTE — Telephone Encounter (Signed)
 FYI Only or Action Required?: FYI only for provider: appointment scheduled on 01/17/24.  Patient was last seen in primary care on 08/30/2023 by Alvia Corean CROME, FNP.  Called Nurse Triage reporting Neck Pain.  Symptoms began several weeks ago.  Interventions attempted: OTC medications: Ibuprofen.  Symptoms are: gradually worsening.  Triage Disposition: See PCP When Office is Open (Within 3 Days)  Patient/caregiver understands and will follow disposition?: Yes  Reason for Disposition  [1] MODERATE neck pain (e.g., interferes with normal activities) AND [2] present > 3 days  Answer Assessment - Initial Assessment Questions Patient calling in to reschedule missed appt from today. Reports worsening neck pain over the last couple weeks. Has been taking Ibuprofen with mild relief. Appt scheduled. States he may need to change this appt, advised to call back if he needs to reschedule.   1. ONSET: When did the pain begin?      A while ago  2. LOCATION: Where does it hurt?      Neck  3. PATTERN Does the pain come and go, or has it been constant since it started?      Constant  4. SEVERITY: How bad is the pain?  (Scale 0-10; or none or slight stiffness, mild, moderate, severe)     Currently 6/10, but does worsen  5. RADIATION: Does the pain go anywhere else, shoot into your arms?     No  6. CORD SYMPTOMS: Any weakness or numbness of the arms or legs?     Intermittent weakness in arms  7. CAUSE: What do you think is causing the neck pain?     Unsure  8. NECK OVERUSE: Any recent activities that involved turning or twisting the neck?     No  9. OTHER SYMPTOMS: Do you have any other symptoms? (e.g., headache, fever, chest pain, difficulty breathing, neck swelling)     Intermittent headaches, neck stiffness  10. PREGNANCY: Is there any chance you are pregnant? When was your last menstrual period?       NA  Protocols used: Neck Pain or  Stiffness-A-AH  Copied from CRM #[id number]. Topic: Clinical - Red Word Triage >> Jan 16, 2024 11:07 AM Adelita E wrote: [facility] that prompted transfer to Nurse Triage: Neck pain and migraines going on since August. Rated neck pain 6 out of 10 today.

## 2024-01-17 ENCOUNTER — Ambulatory Visit

## 2024-01-17 ENCOUNTER — Ambulatory Visit: Admitting: Family Medicine

## 2024-01-17 VITALS — BP 110/70 | HR 77 | Temp 98.7°F | Ht 73.0 in | Wt 178.6 lb

## 2024-01-17 DIAGNOSIS — M542 Cervicalgia: Secondary | ICD-10-CM

## 2024-01-17 DIAGNOSIS — G4719 Other hypersomnia: Secondary | ICD-10-CM

## 2024-01-17 DIAGNOSIS — J9611 Chronic respiratory failure with hypoxia: Secondary | ICD-10-CM

## 2024-01-17 DIAGNOSIS — J431 Panlobular emphysema: Secondary | ICD-10-CM

## 2024-01-17 DIAGNOSIS — T148XXA Other injury of unspecified body region, initial encounter: Secondary | ICD-10-CM

## 2024-01-17 NOTE — Progress Notes (Signed)
 "   Acute Office Visit  Subjective:     Patient ID: Richard Fuentes, male    DOB: 16-Dec-1970, 54 y.o.   MRN: [id number]  No chief complaint on file.   HPI  Discussed the use of AI scribe software for clinical note transcription with the patient, who gave verbal consent to proceed.  History of Present Illness Richard Fuentes is a 54 year old male with advanced emphysema who presents with worsening neck pain and limited mobility.  Cervicalgia and limited cervical mobility - Progressive neck pain since August - Pain centered in the neck, radiating to the shoulder blade, forming a triangular area with maximal pain on one side - Neck pain causes locking and restricts ability to look up, down, or sideways, especially after sitting for several minutes - No improvement with stretching, heat, ice, or rolling - Chiropractic treatment worsened pain and provided no relief  Respiratory symptoms and exacerbations - Advanced emphysema on maintenance prednisone  - Severe respiratory attacks decreased from several times daily to a few times per week - Severe episode this morning while woodworking without a respirator, resulting in gasping and inability to use inhaler effectively - Respiratory episodes cause significant pain and cognitive difficulty for the remainder of the day  Sleep disturbances and neuromuscular symptoms - Longstanding sleep problems with prior episodes of sleep paralysis and nocturnal breathing difficulty - Improvement in sleep with use of an adjustable bed - Marked muscle loss and weakness - Daytime episodes of sudden loss of consciousness ('passing out') - Desires testosterone level checked in relation to muscle loss and weakness  Concerns regarding medical records and imaging - Concern about conflicting information in prior lung imaging and medical records and its impact on care  Social support and advance care planning - Lives alone with limited social support, restricting  access to treatments requiring a caregiver - Has met with palliative care and has a DNR in place - Accepting of disease progression     ROS Per HPI      Objective:    BP 110/70 (BP Location: Left Arm, Patient Position: Sitting)   Pulse 77   Temp 98.7 F (37.1 C) (Temporal)   Ht 6' 1 (1.854 m)   Wt 178 lb 9.6 oz (81 kg)   SpO2 95%   BMI 23.56 kg/m    Physical Exam Vitals and nursing note reviewed.  Constitutional:      General: He is not in acute distress.    Appearance: Normal appearance.  HENT:     Head: Normocephalic and atraumatic.     Right Ear: External ear normal.     Left Ear: External ear normal.     Nose: Nose normal.     Mouth/Throat:     Mouth: Mucous membranes are moist.     Pharynx: Oropharynx is clear.  Eyes:     Extraocular Movements: Extraocular movements intact.  Cardiovascular:     Rate and Rhythm: Normal rate and regular rhythm.     Pulses: Normal pulses.     Heart sounds: Normal heart sounds.  Pulmonary:     Effort: Pulmonary effort is normal. No respiratory distress.     Breath sounds: Decreased breath sounds present. No wheezing, rhonchi or rales.  Musculoskeletal:        General: Normal range of motion.       Arms:     Cervical back: Normal range of motion.     Right lower leg: No edema.     Left lower  leg: No edema.     Comments: Area of mild swelling, tenderness, muscles in spasm  Lymphadenopathy:     Cervical: No cervical adenopathy.  Skin:    General: Skin is warm and dry.  Neurological:     General: No focal deficit present.     Mental Status: He is alert and oriented to person, place, and time.  Psychiatric:        Mood and Affect: Mood normal.        Behavior: Behavior normal.     No results found for any visits on 01/17/24.      Assessment & Plan:   Assessment and Plan Assessment & Plan Cervicalgia, muscle strain Chronic neck pain with radiation to shoulder blade, worsened by certain positions. Previous  chiropractic treatment ineffective. - Ordered cervical spine X-ray to assess anatomical alignment. - Referred to physical therapy for possible dry needling.  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 Recent exacerbation due to lack of respirator use. CT shows advanced emphysema and mucoid impaction. Lung nodule not significant. Discussed lung volume reduction surgery and transplant eligibility concerns. - Continue prednisone  10 mg daily. - Ordered CBC and metabolic panel. - Ordered testosterone level test before 10 AM. - Discussed lung volume reduction surgery and transplant eligibility.  Excessive daytime sleepiness Excessive sleepiness possibly due to chronic respiratory issues and potential testosterone deficiency. Sleep disturbances noted. - Ordered testosterone level test before 10 AM. - Ordered CBC and metabolic panel.     Orders Placed This Encounter  Procedures   DG Cervical Spine 2 or 3 views    Standing Status:   Future    Number of Occurrences:   1    Expiration Date:   03/16/2025    Reason for Exam (SYMPTOM  OR DIAGNOSIS REQUIRED):   persistent neck pain    Preferred imaging location?:   [facility]   CBC w/Diff    Standing Status:   Future    Expiration Date:   01/16/2025   Comp Met (CMET)    Standing Status:   Future    Expiration Date:   01/16/2025   Testosterone    Standing Status:   Future    Expiration Date:   01/16/2025   Ambulatory referral to Sports Medicine    Referral Priority:   Routine    Referral Type:   Consultation    Number of Visits Requested:   1     No orders of the defined types were placed in this encounter.   Return if symptoms worsen or fail to improve.  Richard LITTIE Ku, FNP  "

## 2024-01-17 NOTE — Patient Instructions (Addendum)
 We are getting an xray today. We will be in contact with any abnormal results that require further attention.  Continue steroids.   Referral to sports medicine for further evaluation.  Follow-up with me for new or worsening symptoms.

## 2024-01-24 ENCOUNTER — Ambulatory Visit: Payer: Self-pay | Admitting: Family Medicine

## 2024-01-24 DIAGNOSIS — M542 Cervicalgia: Secondary | ICD-10-CM

## 2024-01-24 NOTE — Progress Notes (Unsigned)
"       ° °  LILLETTE Ileana Collet, PhD, LAT, ATC acting as a scribe for Artist Lloyd, MD.  Richard Fuentes is a 54 y.o. male who presents to [facility] at [facility] today for neck pain x ***. Pt locates pain to ***  Radiates:  UE Numbness/tingling: UE Weakness: Aggravates: Treatments tried:  Dx testing: 01/17/24 C-spine XR  Pertinent review of systems: ***  Relevant historical information: ***   Exam:  There were no vitals taken for this visit. General: Well Developed, well nourished, and in no acute distress.   MSK: ***    Lab and Radiology Results No results found for this or any previous visit (from the past 72 hours). No results found.     Assessment and Plan: 53 y.o. male with ***   PDMP not reviewed this encounter. No orders of the defined types were placed in this encounter.  No orders of the defined types were placed in this encounter.    Discussed warning signs or symptoms. Please see discharge instructions. Patient expresses understanding.   ***  "

## 2024-01-25 ENCOUNTER — Other Ambulatory Visit (HOSPITAL_BASED_OUTPATIENT_CLINIC_OR_DEPARTMENT_OTHER): Payer: Self-pay

## 2024-01-25 ENCOUNTER — Encounter: Payer: Self-pay | Admitting: Family Medicine

## 2024-01-25 ENCOUNTER — Ambulatory Visit: Admitting: Family Medicine

## 2024-01-25 VITALS — BP 130/76 | HR 77 | Ht 73.0 in | Wt 181.0 lb

## 2024-01-25 DIAGNOSIS — M542 Cervicalgia: Secondary | ICD-10-CM

## 2024-01-25 MED ORDER — TIZANIDINE HCL 4 MG PO TABS
4.0000 mg | ORAL_TABLET | Freq: Four times a day (QID) | ORAL | 1 refills | Status: AC | PRN
  Filled 2024-01-25: qty 30, 8d supply, fill #0

## 2024-01-25 MED ORDER — PREDNISONE 50 MG PO TABS
50.0000 mg | ORAL_TABLET | Freq: Every day | ORAL | 0 refills | Status: AC
  Filled 2024-01-25: qty 5, 5d supply, fill #0

## 2024-01-25 NOTE — Patient Instructions (Addendum)
 Thank you for coming in today.   A referral for physical therapy has been submitted. A representative from the physical therapy office will contact you to coordinate scheduling after confirming your benefits with your insurance provider. If you do not hear from the physical therapy office within the next 1-2 weeks, please let us  know.   Prescriptions sent in for

## 2024-01-25 NOTE — Progress Notes (Signed)
"       ° °  I, Leotis Batter, CMA acting as a scribe for Artist Lloyd, MD.  Richard Fuentes is a 54 y.o. male who presents to [facility] at [facility] today for neck pain x 6 months, worsening since onset. Pt locates pain to neck side of the neck. Radiating pain into the upper back, head, and arm. Having HA's d/t pain. Has tried IBU with minimal relief. Denies n/t/w.   Radiates: UE, neck, head UE Numbness/tingling: denies UE Weakness: denies Aggravates: sitting Treatments tried: IBU  Dx testing: 01/17/24 C-spine XR  Pertinent review of systems: No fevers or chills  Relevant historical information: Migraine headache.  Sleep apnea.  COPD managed with oral prednisone .   Exam:  BP 130/76   Pulse 77   Ht 6' 1 (1.854 m)   Wt 181 lb (82.1 kg)   SpO2 97%   BMI 23.88 kg/m  General: Well Developed, well nourished, and in no acute distress.   MSK: C-spine: Normal-appearing Tender palpation left cervical paraspinal musculature.  Decreased cervical motion upper Smir strength is intact.    Lab and Radiology Results   EXAM: 2 or 3 VIEW(S) XRAY OF THE CERVICAL SPINE 01/17/2024 03:05:15 PM   COMPARISON: None available.   CLINICAL HISTORY: persistent neck pain. The patient reports persistent neck pain.   FINDINGS:   BONES: Vertebral body heights are maintained. Alignment is normal.   DISCS AND DEGENERATIVE CHANGES: Degenerative disc disease from C4-C5 through C6-C7 with disc space narrowing and spurring. Mild bilateral degenerative facet disease.   SOFT TISSUES: No prevertebral soft tissue swelling. The visualized lungs appear clear.   IMPRESSION: 1. No acute osseous abnormality. 2. Multilevel degenerative disc and facet disease   Electronically signed by: Franky Crease MD 01/23/2024 10:07 PM EST RP Workstation: [id number] LILLETTE Artist Lloyd, personally (independently) visualized and performed the interpretation of the images attached in this note.     Assessment  and Plan: 54 y.o. male with left lateral neck pain.  This is a chronic issue ongoing for months.  X-ray does show significant degenerative changes that could be a factor.  Majority of pain due to muscle dysfunction and spasm.  Plan for tizanidine  and physical therapy.  Additionally short course of prednisone  prescribed.  Recheck if not improving.  Next step would typically be MRI.   PDMP not reviewed this encounter. Orders Placed This Encounter  Procedures   Ambulatory referral to Physical Therapy    Referral Priority:   Routine    Referral Type:   Physical Medicine    Referral Reason:   Specialty Services Required    Requested Specialty:   Physical Therapy    Number of Visits Requested:   1   Meds ordered this encounter  Medications   predniSONE  (DELTASONE ) 50 MG tablet    Sig: Take 1 tablet (50 mg total) by mouth daily with breakfast for 5 days.    Dispense:  5 tablet    Refill:  0   tiZANidine  (ZANAFLEX ) 4 MG tablet    Sig: Take 1 tablet (4 mg total) by mouth every 6 (six) hours as needed for muscle spasms.    Dispense:  30 tablet    Refill:  1     Discussed warning signs or symptoms. Please see discharge instructions. Patient expresses understanding.   The above documentation has been reviewed and is accurate and complete Artist Lloyd, M.D.   "

## 2024-02-01 NOTE — Therapy (Incomplete)
 " OUTPATIENT PHYSICAL THERAPY CERVICAL EVALUATION   Patient Name: Richard Fuentes MRN: [id number] DOB:16-Feb-1970, 54 y.o., male Today's Date: 02/01/2024  END OF SESSION:   Past Medical History:  Diagnosis Date   Allergy     Penicillin ,wool, shellfish   Anxiety    Asthma    COPD (chronic obstructive pulmonary disease) (HCC)    Severe centrilobular and paraseptal bullous emphysema; Gold stage D; FEV1 and DLCO<30%.;  Currently undergoing transplant evaluation   Depression    Diabetes (HCC)    Dyspnea    Emphysema of lung (HCC)    Environmental allergies    Esophagitis    Family history of adverse reaction to anesthesia    Paternal aunt had trouble waking   GERD (gastroesophageal reflux disease)    Headache    Hx of migraines    OCD (obsessive compulsive disorder)    OCD (obsessive compulsive disorder)    Oxygen  deficiency    Pneumonia    Pneumothorax on left 03/20/2020   Psoriasis    Sleep apnea    Ulcer    Urticaria    Past Surgical History:  Procedure Laterality Date   Endobronchial valve Left 03/20/2020   [facility] at [facility]   KNEE SURGERY Right 2002   arthroscopy and cartilidge removed   KNEE SURGERY Left 1987   Ligaments removed and screw in place   Patient Active Problem List   Diagnosis Date Noted   GAD (generalized anxiety disorder) 09/06/2023   Neck pain 08/30/2023   Muscle strain 08/30/2023   DOE (dyspnea on exertion) 05/17/2023   Embedded teeth with impacted teeth 05/13/2022   Chronic respiratory failure with hypoxia (HCC) 05/13/2022   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) 03/30/2022   Major depressive disorder, recurrent, moderate (HCC) 06/15/2021   Allergic contact dermatitis due to metals 01/07/2020   OSA (obstructive sleep apnea) 09/13/2019   Hyperlipidemia with target LDL less than 100 08/20/2019   Family history of premature coronary artery disease 08/20/2019   Anxiety 08/01/2019   Perennial and seasonal allergic rhinitis 07/24/2019    Recurrent urticaria 07/24/2019   History of penicillin  allergy  07/24/2019   History of food allergy  07/24/2019   Plaque psoriasis 06/05/2019   Chronic periodontal disease 03/21/2019   COPD with emphysema (HCC)GOLD D  12/07/2017   Inguinal hernia with obstruction without gangrene 02/04/2017   Generalized anxiety disorder 12/07/2011   History of tobacco use 12/07/2011    PCP: ***  REFERRING PROVIDER: Alvia Corean CROME, FNP    Joane Artist RAMAN, MD    REFERRING DIAG: M54.2 (ICD-10-CM) - Neck pain   M54.2 (ICD-10-CM) - Cervicalgia   THERAPY DIAG:  No diagnosis found.  Rationale for Evaluation and Treatment: {HABREHAB:27488}  ONSET DATE: ***  SUBJECTIVE:  SUBJECTIVE STATEMENT: Pt saw PCP on 01/17/23 and she ordered x-rays and PT. - Referred to physical therapy for possible dry needling.      - Progressive neck pain since August - Pain centered in the neck, radiating to the shoulder blade, forming a triangular area with maximal pain on one side - Neck pain causes locking and restricts ability to look up, down, or sideways, especially after sitting for several minutes - No improvement with stretching, heat, ice, or rolling - Chiropractic treatment worsened pain and provided no relief  Pt saw Dr. Joane on 1/21--Majority of pain due to muscle dysfunction and spasm. Plan for tizanidine  and physical therapy. Additionally short course of prednisone  prescribed.    Hand dominance: {MISC; OT HAND DOMINANCE:[phone]}  PERTINENT HISTORY:  COPD, advanced emphysema, dyspnea DM Psoriasis Anxiety and depression Longstanding sleep problems , Daytime episodes of sudden loss of consciousness ('passing out')   Bilat knee surgeries   PAIN:  Are you having pain?  {OPRCPAIN:27236}  PRECAUTIONS: {Therapy precautions:24002}  RED FLAGS: {PT Red Flags:29287}     WEIGHT BEARING RESTRICTIONS: {Yes ***/No:24003}  FALLS:  Has patient fallen in last 6 months? {fallsyesno:27318}  LIVING ENVIRONMENT: Lives with: {OPRC lives with:25569::lives with their family} Lives in: {Lives in:25570} Stairs: {opstairs:27293} Has following equipment at home: {Assistive devices:23999}  OCCUPATION: ***  PLOF: {PLOF:24004}  PATIENT GOALS: ***  NEXT MD VISIT: ***  OBJECTIVE:  Note: Objective measures were completed at Evaluation unless otherwise noted.  DIAGNOSTIC FINDINGS:  Cervical X rays: FINDINGS:   BONES: Vertebral body heights are maintained. Alignment is normal.   DISCS AND DEGENERATIVE CHANGES: Degenerative disc disease from C4-C5 through C6-C7 with disc space narrowing and spurring. Mild bilateral degenerative facet disease.   SOFT TISSUES: No prevertebral soft tissue swelling. The visualized lungs appear clear.   IMPRESSION: 1. No acute osseous abnormality. 2. Multilevel degenerative disc and facet disease  PATIENT SURVEYS:  {rehab surveys:24030}  COGNITION: Overall cognitive status: {cognition:24006}  SENSATION: {sensation:27233}  POSTURE: {posture:25561}  PALPATION: ***   CERVICAL ROM:   {AROM/PROM:27142} ROM A/PROM (deg) eval  Flexion   Extension   Right lateral flexion   Left lateral flexion   Right rotation   Left rotation    (Blank rows = not tested)  UPPER EXTREMITY ROM:  {AROM/PROM:27142} ROM Right eval Left eval  Shoulder flexion    Shoulder extension    Shoulder abduction    Shoulder adduction    Shoulder extension    Shoulder internal rotation    Shoulder external rotation    Elbow flexion    Elbow extension    Wrist flexion    Wrist extension    Wrist ulnar deviation    Wrist radial deviation    Wrist pronation    Wrist supination     (Blank rows = not tested)  UPPER EXTREMITY  MMT:  MMT Right eval Left eval  Shoulder flexion    Shoulder extension    Shoulder abduction    Shoulder adduction    Shoulder extension    Shoulder internal rotation    Shoulder external rotation    Middle trapezius    Lower trapezius    Elbow flexion    Elbow extension    Wrist flexion    Wrist extension    Wrist ulnar deviation    Wrist radial deviation    Wrist pronation    Wrist supination    Grip strength     (Blank rows = not tested)  CERVICAL SPECIAL TESTS:  {  Cervical special tests:25246}  FUNCTIONAL TESTS:  {Functional tests:24029}  TREATMENT DATE: ***                                                                                                                                 PATIENT EDUCATION:  Education details: *** Person educated: {Person educated:25204} Education method: {Education Method:25205} Education comprehension: {Education Comprehension:25206}  HOME EXERCISE PROGRAM: ***  ASSESSMENT:  CLINICAL IMPRESSION: Patient is a *** y.o. *** who was seen today for physical therapy evaluation and treatment for ***.   OBJECTIVE IMPAIRMENTS: {opptimpairments:25111}.   ACTIVITY LIMITATIONS: {activitylimitations:27494}  PARTICIPATION LIMITATIONS: {participationrestrictions:25113}  PERSONAL FACTORS: {Personal factors:25162} are also affecting patient's functional outcome.   REHAB POTENTIAL: {rehabpotential:25112}  CLINICAL DECISION MAKING: {clinical decision making:25114}  EVALUATION COMPLEXITY: {Evaluation complexity:25115}   GOALS: Goals reviewed with patient? {yes/no:20286}  SHORT TERM GOALS: Target date: ***  *** Baseline:  Goal status: INITIAL  2.  *** Baseline:  Goal status: INITIAL  3.  *** Baseline:  Goal status: INITIAL  4.  *** Baseline:  Goal status: INITIAL  5.  *** Baseline:  Goal status: INITIAL  6.  *** Baseline:  Goal status: INITIAL  LONG TERM GOALS: Target date: ***  *** Baseline:  Goal status:  INITIAL  2.  *** Baseline:  Goal status: INITIAL  3.  *** Baseline:  Goal status: INITIAL  4.  *** Baseline:  Goal status: INITIAL  5.  *** Baseline:  Goal status: INITIAL  6.  *** Baseline:  Goal status: INITIAL   PLAN:  PT FREQUENCY: {rehab frequency:25116}  PT DURATION: {rehab duration:25117}  PLANNED INTERVENTIONS: {rehab planned interventions:25118::97110-Therapeutic exercises,97530- Therapeutic [phone]- Neuromuscular re-education,97535- Self Rjmz,02859- Manual therapy,Patient/Family education}  PLAN FOR NEXT SESSION: PIERRETTE Mose Minerva, PT 02/01/2024, 8:50 PM      "

## 2024-02-02 ENCOUNTER — Ambulatory Visit (HOSPITAL_BASED_OUTPATIENT_CLINIC_OR_DEPARTMENT_OTHER): Admitting: Physical Therapy

## 2024-02-03 ENCOUNTER — Other Ambulatory Visit

## 2024-02-03 DIAGNOSIS — T148XXA Other injury of unspecified body region, initial encounter: Secondary | ICD-10-CM

## 2024-02-03 DIAGNOSIS — M542 Cervicalgia: Secondary | ICD-10-CM

## 2024-02-03 DIAGNOSIS — G4719 Other hypersomnia: Secondary | ICD-10-CM

## 2024-02-06 ENCOUNTER — Other Ambulatory Visit (HOSPITAL_BASED_OUTPATIENT_CLINIC_OR_DEPARTMENT_OTHER): Payer: Self-pay

## 2024-02-07 ENCOUNTER — Other Ambulatory Visit (HOSPITAL_BASED_OUTPATIENT_CLINIC_OR_DEPARTMENT_OTHER): Payer: Self-pay

## 2024-02-08 ENCOUNTER — Ambulatory Visit (HOSPITAL_BASED_OUTPATIENT_CLINIC_OR_DEPARTMENT_OTHER): Admitting: Physical Therapy

## 2024-02-09 ENCOUNTER — Ambulatory Visit: Payer: Self-pay

## 2024-02-09 NOTE — Telephone Encounter (Signed)
 Spoke with patient, states he will go to the ED later tonight as he called and they told him they are busy.

## 2024-02-09 NOTE — Telephone Encounter (Signed)
 FYI Only or Action Required?: FYI only for provider: ED advised.  Patient was last seen in primary care on 01/17/2024 by Alvia Corean CROME, FNP.  Called Nurse Triage reporting Inguinal Hernia.  Symptoms began several days ago.  Interventions attempted: Rest, hydration, or home remedies.  Symptoms are: gradually worsening.  Triage Disposition: Go to ED Now (Notify PCP)  Patient/caregiver understands and will follow disposition?: Unsure   Reason for Disposition  Hernia is painful or tender to touch  Answer Assessment - Initial Assessment Questions Patient states that he has had an inguinal hernia, but in the last few days it has been bulging out more, baseball sized, firm, and painful. He reports that he has been pushing the hernia back in over the last few days and isn't sure if this is the cause of the pain. Patient reports moderate-severe pain based on activity. Denies any other symptoms at this time. ED advised. Patient originally agrees to go but does sound a little hesitant toward end of call, encouraged to go to ED today.    1. ONSET:  When did this first appear?     First noted September 2024 in chart  2. APPEARANCE: What does it look like?     Mild swelling to area-states this is baseline  3. SIZE: How big is it? (e.g., inches, cm; or compare to coins, fruit)     Baseball sized  4. LOCATION: Where exactly is the hernia located?     Between waist and groin  5. PATTERN: Does the swelling come and go, or has it been constant since it started?     Comes and goes  6. PAIN: Is there any pain? If Yes, ask: How bad is it?  (Scale 0-10; or none, mild, moderate, severe)     Yes, 7/10 at rest and 9-10/10 with movement  7. DIAGNOSIS: Have you been seen by a doctor (or NP/PA) for this? Did the doctor diagnose you as having a hernia?     Yes  8. OTHER SYMPTOMS: Do you have any other symptoms? (e.g., fever, abdomen pain, vomiting)     Answers yes to abdomen  pain, but states he's not sure if it's the hernia or something separate  9. PREGNANCY: Is there any chance you are pregnant? When was your last menstrual period?     NA  Protocols used: [facility]  Reason for Triage: Hernia extremely painful

## 2024-02-13 ENCOUNTER — Encounter (HOSPITAL_BASED_OUTPATIENT_CLINIC_OR_DEPARTMENT_OTHER)

## 2024-02-13 ENCOUNTER — Ambulatory Visit (HOSPITAL_BASED_OUTPATIENT_CLINIC_OR_DEPARTMENT_OTHER): Admitting: Pulmonary Disease

## 2024-02-29 ENCOUNTER — Ambulatory Visit (HOSPITAL_BASED_OUTPATIENT_CLINIC_OR_DEPARTMENT_OTHER): Payer: Self-pay | Admitting: Physical Therapy

## 2024-03-07 ENCOUNTER — Encounter (HOSPITAL_BASED_OUTPATIENT_CLINIC_OR_DEPARTMENT_OTHER): Payer: Self-pay | Admitting: Physical Therapy

## 2024-03-14 ENCOUNTER — Encounter (HOSPITAL_BASED_OUTPATIENT_CLINIC_OR_DEPARTMENT_OTHER): Payer: Self-pay | Admitting: Physical Therapy

## 2024-03-21 ENCOUNTER — Encounter (HOSPITAL_BASED_OUTPATIENT_CLINIC_OR_DEPARTMENT_OTHER): Payer: Self-pay | Admitting: Physical Therapy
